# Patient Record
Sex: Female | Born: 1945 | Race: White | Hispanic: No | Smoking: Never smoker
Health system: Southern US, Community
[De-identification: ages and names within clinical notes are randomized; demographics above are authoritative.]

## PROBLEM LIST (undated history)

## (undated) DIAGNOSIS — J189 Pneumonia, unspecified organism: Secondary | ICD-10-CM

## (undated) DIAGNOSIS — M5481 Occipital neuralgia: Secondary | ICD-10-CM

## (undated) DIAGNOSIS — E538 Deficiency of other specified B group vitamins: Secondary | ICD-10-CM

## (undated) DIAGNOSIS — R1310 Dysphagia, unspecified: Secondary | ICD-10-CM

## (undated) DIAGNOSIS — C44212 Basal cell carcinoma of skin of right ear and external auricular canal: Secondary | ICD-10-CM

## (undated) DIAGNOSIS — E785 Hyperlipidemia, unspecified: Secondary | ICD-10-CM

## (undated) DIAGNOSIS — F32A Depression, unspecified: Secondary | ICD-10-CM

## (undated) DIAGNOSIS — K219 Gastro-esophageal reflux disease without esophagitis: Secondary | ICD-10-CM

## (undated) DIAGNOSIS — R011 Cardiac murmur, unspecified: Secondary | ICD-10-CM

## (undated) DIAGNOSIS — I499 Cardiac arrhythmia, unspecified: Secondary | ICD-10-CM

## (undated) DIAGNOSIS — F329 Major depressive disorder, single episode, unspecified: Secondary | ICD-10-CM

## (undated) DIAGNOSIS — M8580 Other specified disorders of bone density and structure, unspecified site: Secondary | ICD-10-CM

## (undated) DIAGNOSIS — T7840XA Allergy, unspecified, initial encounter: Secondary | ICD-10-CM

## (undated) DIAGNOSIS — E039 Hypothyroidism, unspecified: Secondary | ICD-10-CM

## (undated) DIAGNOSIS — G43909 Migraine, unspecified, not intractable, without status migrainosus: Secondary | ICD-10-CM

## (undated) DIAGNOSIS — R790 Abnormal level of blood mineral: Secondary | ICD-10-CM

## (undated) DIAGNOSIS — M2550 Pain in unspecified joint: Secondary | ICD-10-CM

## (undated) DIAGNOSIS — F411 Generalized anxiety disorder: Secondary | ICD-10-CM

## (undated) DIAGNOSIS — G4700 Insomnia, unspecified: Secondary | ICD-10-CM

## (undated) DIAGNOSIS — H269 Unspecified cataract: Secondary | ICD-10-CM

## (undated) DIAGNOSIS — M1990 Unspecified osteoarthritis, unspecified site: Secondary | ICD-10-CM

## (undated) DIAGNOSIS — K635 Polyp of colon: Secondary | ICD-10-CM

## (undated) DIAGNOSIS — Z0000 Encounter for general adult medical examination without abnormal findings: Secondary | ICD-10-CM

## (undated) DIAGNOSIS — Z8489 Family history of other specified conditions: Secondary | ICD-10-CM

## (undated) DIAGNOSIS — E063 Autoimmune thyroiditis: Secondary | ICD-10-CM

## (undated) DIAGNOSIS — M810 Age-related osteoporosis without current pathological fracture: Secondary | ICD-10-CM

## (undated) DIAGNOSIS — M1710 Unilateral primary osteoarthritis, unspecified knee: Secondary | ICD-10-CM

## (undated) DIAGNOSIS — K829 Disease of gallbladder, unspecified: Secondary | ICD-10-CM

## (undated) DIAGNOSIS — M549 Dorsalgia, unspecified: Secondary | ICD-10-CM

## (undated) DIAGNOSIS — F419 Anxiety disorder, unspecified: Secondary | ICD-10-CM

## (undated) DIAGNOSIS — Z780 Asymptomatic menopausal state: Secondary | ICD-10-CM

## (undated) DIAGNOSIS — G8929 Other chronic pain: Secondary | ICD-10-CM

## (undated) DIAGNOSIS — J209 Acute bronchitis, unspecified: Secondary | ICD-10-CM

## (undated) DIAGNOSIS — R0600 Dyspnea, unspecified: Secondary | ICD-10-CM

## (undated) DIAGNOSIS — E559 Vitamin D deficiency, unspecified: Secondary | ICD-10-CM

## (undated) DIAGNOSIS — R232 Flushing: Secondary | ICD-10-CM

## (undated) DIAGNOSIS — R55 Syncope and collapse: Secondary | ICD-10-CM

## (undated) DIAGNOSIS — K5900 Constipation, unspecified: Secondary | ICD-10-CM

## (undated) HISTORY — DX: Asymptomatic menopausal state: Z78.0

## (undated) HISTORY — DX: Major depressive disorder, single episode, unspecified: F32.9

## (undated) HISTORY — DX: Depression, unspecified: F32.A

## (undated) HISTORY — DX: Gastro-esophageal reflux disease without esophagitis: K21.9

## (undated) HISTORY — DX: Allergy, unspecified, initial encounter: T78.40XA

## (undated) HISTORY — DX: Anxiety disorder, unspecified: F41.9

## (undated) HISTORY — DX: Age-related osteoporosis without current pathological fracture: M81.0

## (undated) HISTORY — DX: Pain in unspecified joint: M25.50

## (undated) HISTORY — DX: Unspecified cataract: H26.9

## (undated) HISTORY — DX: Unilateral primary osteoarthritis, unspecified knee: M17.10

## (undated) HISTORY — DX: Encounter for general adult medical examination without abnormal findings: Z00.00

## (undated) HISTORY — DX: Dysphagia, unspecified: R13.10

## (undated) HISTORY — DX: Hypothyroidism, unspecified: E03.9

## (undated) HISTORY — DX: Hyperlipidemia, unspecified: E78.5

## (undated) HISTORY — DX: Vitamin D deficiency, unspecified: E55.9

## (undated) HISTORY — DX: Migraine, unspecified, not intractable, without status migrainosus: G43.909

## (undated) HISTORY — DX: Generalized anxiety disorder: F41.1

## (undated) HISTORY — DX: Disease of gallbladder, unspecified: K82.9

## (undated) HISTORY — DX: Insomnia, unspecified: G47.00

## (undated) HISTORY — DX: Acute bronchitis, unspecified: J20.9

## (undated) HISTORY — DX: Flushing: R23.2

## (undated) HISTORY — DX: Polyp of colon: K63.5

## (undated) HISTORY — DX: Autoimmune thyroiditis: E06.3

## (undated) HISTORY — DX: Unspecified osteoarthritis, unspecified site: M19.90

## (undated) HISTORY — PX: OTHER SURGICAL HISTORY: SHX169

## (undated) HISTORY — DX: Abnormal level of blood mineral: R79.0

## (undated) HISTORY — PX: CATARACT EXTRACTION: SUR2

## (undated) HISTORY — DX: Dyspnea, unspecified: R06.00

## (undated) HISTORY — DX: Cardiac murmur, unspecified: R01.1

## (undated) HISTORY — PX: EYE SURGERY: SHX253

## (undated) HISTORY — DX: Constipation, unspecified: K59.00

## (undated) HISTORY — DX: Cardiac arrhythmia, unspecified: I49.9

## (undated) HISTORY — PX: SPINE SURGERY: SHX786

## (undated) HISTORY — DX: Deficiency of other specified B group vitamins: E53.8

## (undated) HISTORY — DX: Occipital neuralgia: M54.81

## (undated) HISTORY — DX: Syncope and collapse: R55

## (undated) HISTORY — DX: Other specified disorders of bone density and structure, unspecified site: M85.80

---

## 1971-09-16 HISTORY — PX: TUBAL LIGATION: SHX77

## 1972-09-15 DIAGNOSIS — R112 Nausea with vomiting, unspecified: Secondary | ICD-10-CM

## 1972-09-15 DIAGNOSIS — Z9889 Other specified postprocedural states: Secondary | ICD-10-CM

## 1972-09-15 HISTORY — PX: CHOLECYSTECTOMY OPEN: SUR202

## 1972-09-15 HISTORY — DX: Nausea with vomiting, unspecified: R11.2

## 1972-09-15 HISTORY — DX: Other specified postprocedural states: Z98.890

## 1998-05-10 ENCOUNTER — Other Ambulatory Visit: Admission: RE | Admit: 1998-05-10 | Discharge: 1998-05-10 | Payer: Self-pay | Admitting: Gynecology

## 1998-11-28 ENCOUNTER — Other Ambulatory Visit: Admission: RE | Admit: 1998-11-28 | Discharge: 1998-11-28 | Payer: Self-pay | Admitting: Gynecology

## 1999-12-03 ENCOUNTER — Other Ambulatory Visit: Admission: RE | Admit: 1999-12-03 | Discharge: 1999-12-03 | Payer: Self-pay | Admitting: Gynecology

## 2000-12-07 ENCOUNTER — Other Ambulatory Visit: Admission: RE | Admit: 2000-12-07 | Discharge: 2000-12-07 | Payer: Self-pay | Admitting: Gynecology

## 2001-09-30 ENCOUNTER — Encounter: Payer: Self-pay | Admitting: Internal Medicine

## 2001-09-30 ENCOUNTER — Emergency Department (HOSPITAL_COMMUNITY): Admission: EM | Admit: 2001-09-30 | Discharge: 2001-09-30 | Payer: Self-pay | Admitting: Internal Medicine

## 2002-02-08 ENCOUNTER — Other Ambulatory Visit: Admission: RE | Admit: 2002-02-08 | Discharge: 2002-02-08 | Payer: Self-pay | Admitting: Family Medicine

## 2002-02-15 ENCOUNTER — Encounter: Payer: Self-pay | Admitting: Family Medicine

## 2002-02-15 ENCOUNTER — Ambulatory Visit (HOSPITAL_COMMUNITY): Admission: RE | Admit: 2002-02-15 | Discharge: 2002-02-15 | Payer: Self-pay | Admitting: Family Medicine

## 2002-03-03 ENCOUNTER — Encounter: Admission: RE | Admit: 2002-03-03 | Discharge: 2002-03-03 | Payer: Self-pay | Admitting: Family Medicine

## 2002-03-03 ENCOUNTER — Encounter: Payer: Self-pay | Admitting: Family Medicine

## 2003-02-23 ENCOUNTER — Other Ambulatory Visit: Admission: RE | Admit: 2003-02-23 | Discharge: 2003-02-23 | Payer: Self-pay | Admitting: Family Medicine

## 2004-02-29 ENCOUNTER — Other Ambulatory Visit: Admission: RE | Admit: 2004-02-29 | Discharge: 2004-02-29 | Payer: Self-pay | Admitting: Family Medicine

## 2004-03-06 ENCOUNTER — Ambulatory Visit (HOSPITAL_COMMUNITY): Admission: RE | Admit: 2004-03-06 | Discharge: 2004-03-06 | Payer: Self-pay | Admitting: Family Medicine

## 2004-09-15 HISTORY — PX: COLONOSCOPY: SHX174

## 2005-03-07 ENCOUNTER — Other Ambulatory Visit: Admission: RE | Admit: 2005-03-07 | Discharge: 2005-03-07 | Payer: Self-pay | Admitting: Family Medicine

## 2006-03-12 ENCOUNTER — Other Ambulatory Visit: Admission: RE | Admit: 2006-03-12 | Discharge: 2006-03-12 | Payer: Self-pay | Admitting: Family Medicine

## 2006-03-23 ENCOUNTER — Ambulatory Visit (HOSPITAL_COMMUNITY): Admission: RE | Admit: 2006-03-23 | Discharge: 2006-03-23 | Payer: Self-pay | Admitting: Family Medicine

## 2007-09-02 ENCOUNTER — Ambulatory Visit: Payer: Self-pay | Admitting: Cardiology

## 2007-09-21 ENCOUNTER — Ambulatory Visit: Payer: Self-pay

## 2009-01-29 ENCOUNTER — Encounter: Admission: RE | Admit: 2009-01-29 | Discharge: 2009-01-29 | Payer: Self-pay | Admitting: Internal Medicine

## 2009-02-14 ENCOUNTER — Encounter: Admission: RE | Admit: 2009-02-14 | Discharge: 2009-02-14 | Payer: Self-pay | Admitting: Internal Medicine

## 2009-02-14 ENCOUNTER — Encounter (INDEPENDENT_AMBULATORY_CARE_PROVIDER_SITE_OTHER): Payer: Self-pay | Admitting: Interventional Radiology

## 2009-02-14 ENCOUNTER — Other Ambulatory Visit: Admission: RE | Admit: 2009-02-14 | Discharge: 2009-02-14 | Payer: Self-pay | Admitting: Interventional Radiology

## 2010-02-18 ENCOUNTER — Encounter: Admission: RE | Admit: 2010-02-18 | Discharge: 2010-02-18 | Payer: Self-pay | Admitting: Internal Medicine

## 2010-10-20 ENCOUNTER — Emergency Department (HOSPITAL_COMMUNITY)
Admission: EM | Admit: 2010-10-20 | Discharge: 2010-10-20 | Disposition: A | Discharge status: Home / Self Care | Payer: BC Managed Care – PPO | Attending: Emergency Medicine | Admitting: Emergency Medicine

## 2010-10-20 ENCOUNTER — Emergency Department (HOSPITAL_COMMUNITY): Payer: BC Managed Care – PPO

## 2010-10-20 DIAGNOSIS — R079 Chest pain, unspecified: Secondary | ICD-10-CM | POA: Insufficient documentation

## 2010-10-20 DIAGNOSIS — R0602 Shortness of breath: Secondary | ICD-10-CM | POA: Insufficient documentation

## 2010-10-20 DIAGNOSIS — R0609 Other forms of dyspnea: Secondary | ICD-10-CM | POA: Insufficient documentation

## 2010-10-20 DIAGNOSIS — M549 Dorsalgia, unspecified: Secondary | ICD-10-CM | POA: Insufficient documentation

## 2010-10-20 DIAGNOSIS — R0989 Other specified symptoms and signs involving the circulatory and respiratory systems: Secondary | ICD-10-CM | POA: Insufficient documentation

## 2010-10-20 DIAGNOSIS — Z79899 Other long term (current) drug therapy: Secondary | ICD-10-CM | POA: Insufficient documentation

## 2010-10-20 DIAGNOSIS — M79609 Pain in unspecified limb: Secondary | ICD-10-CM | POA: Insufficient documentation

## 2010-10-20 LAB — POCT CARDIAC MARKERS
CKMB, poc: <1 ng/mL — ABNORMAL LOW (ref 1.0–8.0)
Myoglobin, poc: 61.4 ng/mL (ref 12–200)
Troponin i, poc: <0.05 ng/mL (ref 0.00–0.09)

## 2011-01-28 NOTE — Assessment & Plan Note (Signed)
[facility]                            CARDIOLOGY OFFICE NOTE   NAME:[name], [name]                     MRN:          [id number]  DATE:09/02/2007                            DOB:          May 03, 1946    I was asked by Paulene Floor to consult on [facility] with dyspnea on  exertion.   [name] Nolan is a 65 year old married white female, wife of a friend of  mine, who I saw initially for similar complaints back in 2003.  At that  time, we performed a stress Myoview which showed reduced exercise  tolerance of only 5 minutes on a Bruce protocol.  She had no ischemia or  infarction.  Her EF was 60%.   Over the last several months, she has noted an increase in dyspnea on  exertion particularly with activity.  This is particularly true with  housework.   She does not exercise on a regular basis.   Her cardiac risk factors include age, sex, hyperlipidemia once treated  with Zocor with good results, but she stopped because of arthralgias.  She does not smoke, does not have a history of hypertension or diabetes.  There is no premature history of coronary disease nor is it relevant in  her age.   Her total cholesterol runs about 250, but she has a good HDL which runs  in the 56 to 62 range.  Her total cholesterol to HDL ratio is about 4.  Her triglycerides were normal, and her LDL runs about 130 off of  treatment.   PAST MEDICAL HISTORY:  She is intolerant of CODEINE.   CURRENT MEDICATIONS:  1. Levoxyl 75 mcg a day.  2. Lexapro 20 mg a day.  3. Lorazepam 0.5 mg p.r.n.  4. Tandem Plus vitamin for some low-grade anemia with a hemoglobin of      10.5 when last checked.  5. Phentermine 37.5 mg q. day.  6. Fish oil 1,000 mg p.o. q. day.  7. Over-the-counter potassium.   PAST SURGICAL HISTORY:  Cholecystectomy in 1973.   FAMILY HISTORY:  Noncontributory.   SOCIAL HISTORY:  She is semi-retired from state, part-time job as an  Print production planner at  [employer].  She is married and  has one child.  Her husband, Leonette Most, is with her today.   REVIEW OF SYSTEMS:  Other than some history of anxiety and fatigue,  negative.   PHYSICAL EXAMINATION:  GENERAL:  She is a very pleasant lady in no acute  distress.  She has a great sense of humor.  VITAL SIGNS:  Her blood pressure is 148/96 today but usually is normal.  Her heart rate is 72 and regular.  Her electrocardiogram recently at  [facility] was normal.  Her height is 5 foot, 1-1/2 inches.  She  weighs 179 pounds.  HEENT:  Normocephalic, atraumatic, PERRLA, extraocular movements intact,  sclerae are clear, facial symmetry is normal.  She wears glasses.  Dentition is  satisfactory.  Carotid upstrokes are equal bilaterally  without bruits, no JVD.  NECK:  Supple, thyroid is not enlarged, trachea is midline.  LUNGS:  Clear.  HEART:  Reveals a nondisplaced PMI.  She has normal S1, S2, no click.  ABDOMINAL EXAM:  Soft, good bowel sounds, no midline bruit.  EXTREMITIES:  No cyanosis, clubbing or edema.  Pulses are intact.  NEURO EXAM:  Intact.   ASSESSMENT:  1. Dyspnea on exertion.  This most likely is secondary to      deconditioning and some low-grade anemia.  She is also somewhat      overweight.  We discussed this quite openly.  2. Hyperlipidemia.  She seems to be intolerant of ZOCOR.  She has a      very good total cholesterol to HDL ratio.  May not need a statin      with her other cardiac risk factors being fairly low.  3. Sedentary lifestyle.   RECOMMENDATIONS:  1. Exercise stress  test with Myoview to rule out obstructive coronary      artery disease.  2. Begin regular exercise program such as walking three hours per      week.  The risks reduction with this would be equal to that of      about a statin.  I tried to make her see the natural investment of      this, not having to take a drug as well.   Assuming her exercise stress test with Myoview  is normal, we will see  her back on a p.r.n. basis.     Thomas C. Daleen Squibb, MD, [facility]  Electronically Signed    TCW/MedQ  DD: 09/02/2007  DT: 09/03/2007  Job #: [id number]   cc:   Paulene Floor, NP

## 2012-12-28 ENCOUNTER — Other Ambulatory Visit (HOSPITAL_COMMUNITY): Payer: Self-pay | Admitting: Family Medicine

## 2012-12-28 DIAGNOSIS — Z139 Encounter for screening, unspecified: Secondary | ICD-10-CM

## 2012-12-28 DIAGNOSIS — N644 Mastodynia: Secondary | ICD-10-CM

## 2013-01-03 ENCOUNTER — Ambulatory Visit (HOSPITAL_COMMUNITY)
Admission: RE | Admit: 2013-01-03 | Discharge: 2013-01-03 | Disposition: A | Discharge status: Home / Self Care | Payer: Medicare Other | Source: Ambulatory Visit | Attending: Family Medicine | Admitting: Family Medicine

## 2013-01-03 DIAGNOSIS — Z139 Encounter for screening, unspecified: Secondary | ICD-10-CM

## 2013-01-12 ENCOUNTER — Ambulatory Visit (HOSPITAL_COMMUNITY)
Admission: RE | Admit: 2013-01-12 | Discharge: 2013-01-12 | Disposition: A | Discharge status: Home / Self Care | Payer: Medicare Other | Source: Ambulatory Visit | Attending: Family Medicine | Admitting: Family Medicine

## 2013-01-12 ENCOUNTER — Other Ambulatory Visit (HOSPITAL_COMMUNITY): Payer: Self-pay | Admitting: Family Medicine

## 2013-01-12 DIAGNOSIS — N644 Mastodynia: Secondary | ICD-10-CM

## 2013-02-24 ENCOUNTER — Other Ambulatory Visit: Payer: Self-pay | Admitting: Internal Medicine

## 2013-02-24 DIAGNOSIS — E041 Nontoxic single thyroid nodule: Secondary | ICD-10-CM

## 2013-03-14 ENCOUNTER — Ambulatory Visit
Admission: RE | Admit: 2013-03-14 | Discharge: 2013-03-14 | Disposition: A | Discharge status: Home / Self Care | Payer: Medicare Other | Source: Ambulatory Visit | Attending: Internal Medicine | Admitting: Internal Medicine

## 2013-03-14 DIAGNOSIS — E041 Nontoxic single thyroid nodule: Secondary | ICD-10-CM

## 2013-07-12 ENCOUNTER — Ambulatory Visit (INDEPENDENT_AMBULATORY_CARE_PROVIDER_SITE_OTHER): Payer: Self-pay | Admitting: Neurology

## 2013-07-12 ENCOUNTER — Encounter (INDEPENDENT_AMBULATORY_CARE_PROVIDER_SITE_OTHER): Payer: Self-pay

## 2013-07-12 ENCOUNTER — Encounter: Payer: Self-pay | Admitting: Diagnostic Neuroimaging

## 2013-07-12 ENCOUNTER — Ambulatory Visit (INDEPENDENT_AMBULATORY_CARE_PROVIDER_SITE_OTHER): Payer: Medicare Other | Admitting: Diagnostic Neuroimaging

## 2013-07-12 VITALS — BP 107/75 | HR 63 | Temp 98.0°F | Ht 64.5 in | Wt 160.0 lb

## 2013-07-12 DIAGNOSIS — G43719 Chronic migraine without aura, intractable, without status migrainosus: Secondary | ICD-10-CM | POA: Insufficient documentation

## 2013-07-12 DIAGNOSIS — Z0289 Encounter for other administrative examinations: Secondary | ICD-10-CM

## 2013-07-12 DIAGNOSIS — G43009 Migraine without aura, not intractable, without status migrainosus: Secondary | ICD-10-CM

## 2013-07-12 MED ORDER — VALPROATE SODIUM 500 MG/5ML IV SOLN
1000.0000 mg | INTRAVENOUS | Status: DC
  Administered 2013-07-12: 1000 mg via INTRAVENOUS

## 2013-07-12 MED ORDER — GABAPENTIN 300 MG PO CAPS: 300.0000 mg | ORAL_CAPSULE | Freq: Three times a day (TID) | ORAL | Status: DC

## 2013-07-12 NOTE — Progress Notes (Signed)
Patient here seeing Dr. Marjory Lies.  Order for Depacon 1000mg  IV.  Patient to treatment room with friend.  Patient complains of headache behind eyes, level 6, for 5 weeks. IV started in left AC, good blood return, 24g angiocath.  Depacon 1000mg /100cc NS started at 1139.  Upon completion headache down to level 3.  IV discontinued and removed.  Patient to checkout with friend in NAD.

## 2013-07-12 NOTE — Progress Notes (Signed)
[facility]  PATIENT: [name] Nolan DOB: 04-07-1946  REFERRING CLINICIAN: Stallings HISTORY FROM: patient and SO REASON FOR VISIT: new consult   HISTORICAL  CHIEF COMPLAINT:  Chief Complaint  Patient presents with  . Migraine    HISTORY OF PRESENT ILLNESS:   UPDATE 07/12/13: His last visit patient's husband has passed away and patient was under significant stress. Patient then had significant stress with reported lack of family support. Patient then started dating and other long time friend, whom she is now engaged to. They're planning to get married soon. Patient continues to have significant headaches over the past 5 weeks. She's tried steroids and Toradol without significant relief. Now she has a dull nagging headache. She's been taking Excedrin Migraine, Motrin, Aleve without benefit. She's having at least one severe headache per week. She has some nausea, photophobia, phonophobia. Headache is on left side but sometimes reduce the right side.  UPDATE 12/26/10: Doing about the same.  Headaches much better on gabapentin.  Took hydrocodone last night for left leg pain, and today has worse headache.  Left hip/leg/knee pain still persistent.  PRIOR HPI (10/01/10): 67 year old right-handed female with migraines, hypercholesterolemia, depression, anxiety, here for evaluation of intractable headaches.  She is here with her daughter and granddaughter for this visit.  Patient reports history of migraine headaches since age 57 years old. She describes unilateral, severe headaches associated with nausea, photophobia and phonophobia. Left side is more affected than the right side. She had approximately 3-4 migraine headaches per month until menopause and then her headaches subsided. She has tried Imitrex in the past but developed throat swelling and had to stop taking it. She has also tried Fioricet, Phenergan and Topamax without good relief.  However in winter 2010 patient  fell down, struck her back and head, and since that time she has had increasing headaches. She now reports daily headaches that are different from her prior migraine headaches. She has been taking Excedrin Migraine, 2-4 tablets every day for the past 5 months.  She also takes Aleve and Tylenol for arthritis pain on daily basis.  Psychosocial factors include reports poor sleep, increased irritability, increased stress, especially related to her husbands medical condition of dementia with Lewy bodies.  REVIEW OF SYSTEMS: Full 14 system review of systems performed and notable only for fatigue ringing in ears cough eye pain blurred vision easy bruising feeling hot feeling cold memory loss confusion headache numbness dizziness anxiety numbness and decreased energy change in appetite racing thoughts.  ALLERGIES: Allergies  Allergen Reactions  . Codeine   . Sulfa Antibiotics   . Sumatriptan Swelling    throat    HOME MEDICATIONS: No outpatient prescriptions prior to visit.   No facility-administered medications prior to visit.    PAST MEDICAL HISTORY: Past Medical History  Diagnosis Date  . Hypothyroid   . Migraine   . Anxiety   . Occipital neuralgia   . Hoarseness or changing voice   . Flushing   . Insomnia   . Esophageal reflux   . Difficulty swallowing   . Prolonged depressive reaction     PAST SURGICAL HISTORY: Past Surgical History  Procedure Laterality Date  . Cholecystectomy    . Tubal ligation      FAMILY HISTORY: Family History  Problem Relation Age of Onset  . Congestive Heart Failure Mother   . Leukemia Father     SOCIAL HISTORY:  History   Social History  . Marital Status: Significant Other  Spouse Name: Kathlene November    Number of Children: 1  . Years of Education: HS   Occupational History  . Retired    Social History Main Topics  . Smoking status: Never Smoker   . Smokeless tobacco: Never Used  . Alcohol Use: No  . Drug Use: No  . Sexual Activity:  Not on file   Other Topics Concern  . Not on file   Social History Narrative   Patient lives at home alone.   Caffeine Use: Occasionally     PHYSICAL EXAM  Filed Vitals:   07/12/13 1006  BP: 107/75  Pulse: 63  Temp: 98 F (36.7 C)  TempSrc: Oral  Height: 5' 4.5" (1.638 m)  Weight: 160 lb (72.576 kg)    Not recorded    Body mass index is 27.05 kg/(m^2).  GENERAL EXAM: Patient is in no distress  CARDIOVASCULAR: Regular rate and rhythm, no murmurs, no carotid bruits  NEUROLOGIC: MENTAL STATUS: awake, alert, language fluent, comprehension intact, naming intact CRANIAL NERVE: no papilledema on fundoscopic exam, pupils equal and reactive to light, visual fields full to confrontation, extraocular muscles intact, no nystagmus, facial sensation and strength symmetric, uvula midline, shoulder shrug symmetric, tongue midline. MOTOR: normal bulk and tone, full strength in the BUE, BLE SENSORY: normal and symmetric to light touch, pinprick, temperature, vibration COORDINATION: finger-nose-finger, fine finger movements normal REFLEXES: deep tendon reflexes present and symmetric GAIT/STATION: narrow based gait; able to walk on toes, heels and tandem; romberg is negative   DIAGNOSTIC DATA (LABS, IMAGING, TESTING) - I reviewed patient records, labs, notes, testing and imaging myself where available.  No results found for this basename: WBC, HGB, HCT, MCV, PLT   No results found for this basename: na, k, cl, co2, glucose, bun, creatinine, calcium, prot, albumin, ast, alt, alkphos, bilitot, gfrnonaa, gfraa   No results found for this basename: CHOL, HDL, LDLCALC, LDLDIRECT, TRIG, CHOLHDL   No results found for this basename: HGBA1C   No results found for this basename: VITAMINB12   No results found for this basename: TSH    10/03/10 MRI brain - mild chronic small vessel ischemic disease   ASSESSMENT AND PLAN  67 y.o. year old female here with worsening headaches in  setting of increased psychosocial stressors. Most likely represents migraine.  PLAN: - depacon infusion today - stop verapamil - start gabapentin - aleve or ibuprofen prn HA  Return in about 2 months (around 09/11/2013) for with Edison Nasuti, MD 07/12/2013, 11:15 AM Certified in Neurology, Neurophysiology and Neuroimaging  [facility] [address], [address] [place], [place] [address] [phone]

## 2013-07-12 NOTE — Patient Instructions (Signed)
Stop verapamil.  Start gabapentin 300mg  at bedtime. Gradually increase to three times a day.  Use aleve or ibuprofen as needed for breakthrough headaches.

## 2013-07-12 NOTE — Patient Instructions (Signed)
Patient left with friend feeling better.

## 2013-07-13 ENCOUNTER — Telehealth: Payer: Self-pay | Admitting: Diagnostic Neuroimaging

## 2013-07-13 NOTE — Telephone Encounter (Signed)
Ok

## 2013-07-18 ENCOUNTER — Telehealth: Payer: Self-pay | Admitting: *Deleted

## 2013-07-18 NOTE — Telephone Encounter (Signed)
Patient said that she wakes up 2-4  Am, headaches,neck pain which radiates down left side of jaw, takes 2 gabapentin tablets. Does  she need a dosage increase?  She is also taking xanax-1mg  to help sleep. Is also taking an 81 mg aspirin?

## 2013-07-22 MED ORDER — GABAPENTIN 300 MG PO CAPS: 600.0000 mg | ORAL_CAPSULE | Freq: Three times a day (TID) | ORAL | Status: DC

## 2013-07-22 NOTE — Telephone Encounter (Signed)
I called patient. I don't have the MRI disc yet. Will increase gabapentin to 600mg  TID.   Suanne Marker, MD 07/22/2013, 2:00 PM Certified in Neurology, Neurophysiology and Neuroimaging  [facility] [address], [address] [place], [place] [address] [phone]

## 2013-08-02 ENCOUNTER — Telehealth: Payer: Self-pay

## 2013-08-02 ENCOUNTER — Telehealth: Payer: Self-pay | Admitting: Diagnostic Neuroimaging

## 2013-08-02 NOTE — Telephone Encounter (Signed)
I called patient to discuss her desire to change doctors. Her husband, who has passed, saw Dr. Anne Hahn and she likes him very much. I let her know that my concern is that Dr. Anne Hahn does have any appointments open before May or June. Patient has a history of migraines. I asked her if she thought it would be best for me to see if one of our doctors who specialize in migraines would have availability to take her. She asked if Dr. Marjory Lies did not. I let her know he does have global neuro knowledge but there are others within our practice who have a greater focus specifically on migraines. Patient states she wants which ever one I would go to.  I let her know I would go to each for different reasons. I asked her if it would be okay to have them review her history and decide among them which felt would be the best for her.

## 2013-08-02 NOTE — Telephone Encounter (Signed)
I reviewed MRI from [facility] done on 04/29/13. Agree with report: chronic small vessel ischemic disease. I called patient with MRI results.  Suanne Marker, MD 08/02/2013, 6:58 PM Certified in Neurology, Neurophysiology and Neuroimaging  [facility] [address], [address] [place], [place] [address] [phone]

## 2013-09-04 ENCOUNTER — Other Ambulatory Visit: Payer: Self-pay

## 2013-09-04 MED ORDER — GABAPENTIN 300 MG PO CAPS: 600.0000 mg | ORAL_CAPSULE | Freq: Three times a day (TID) | ORAL | Status: DC

## 2013-09-20 ENCOUNTER — Encounter (INDEPENDENT_AMBULATORY_CARE_PROVIDER_SITE_OTHER): Payer: Self-pay

## 2013-09-20 ENCOUNTER — Ambulatory Visit (INDEPENDENT_AMBULATORY_CARE_PROVIDER_SITE_OTHER): Payer: Medicare Other | Admitting: Nurse Practitioner

## 2013-09-20 ENCOUNTER — Encounter: Payer: Self-pay | Admitting: Nurse Practitioner

## 2013-09-20 VITALS — BP 119/76 | HR 65 | Ht 64.5 in | Wt 171.0 lb

## 2013-09-20 DIAGNOSIS — G43009 Migraine without aura, not intractable, without status migrainosus: Secondary | ICD-10-CM

## 2013-09-20 MED ORDER — INDOMETHACIN 50 MG PO CAPS: 50.0000 mg | ORAL_CAPSULE | Freq: Three times a day (TID) | ORAL | Status: DC | PRN

## 2013-09-20 NOTE — Progress Notes (Signed)
PATIENT: [name] Nolan DOB: [birth year]   REASON FOR VISIT: follow up for Migraines HISTORY FROM: patient  HISTORY OF PRESENT ILLNESS: UPDATE 09/20/13 LL: [name] Nolan returns for revisit.  Gabapentin has helped relieve severity of headaches, but they are still present, Excedrin Migraine is the only thing that she takes that relieves them.  She has married her friend and still has a lot of stress because her daughter will not accept the marriage.  She is going still to grief counseling for her former husband.  Headaches she thinks are triggered by stress; she feels a lot of stress in her neck and shoulders, She wakes with dull headache on the top of her head and then it moves behind her eyes.  Overall, she thinks they are better than last visit.  UPDATE 07/12/13 (VP): Her last visit patient's husband has passed away and patient was under significant stress. Patient then had significant stress with reported lack of family support. Patient then started dating and other long time friend, whom she is now engaged to. They're planning to get married soon. Patient continues to have significant headaches over the past 5 weeks. She's tried steroids and Toradol without significant relief. Now she has a dull nagging headache. She's been taking Excedrin Migraine, Motrin, Aleve without benefit. She's having at least one severe headache per week. She has some nausea, photophobia, phonophobia. Headache is on left side but sometimes reduce the right side.  UPDATE 12/26/10: Doing about the same. Headaches much better on gabapentin. Took hydrocodone last night for left leg pain, and today has worse headache. Left hip/leg/knee pain still persistent.  PRIOR HPI (10/01/10): 68 year old right-handed female with migraines, hypercholesterolemia, depression, anxiety, here for evaluation of intractable headaches. She is here with her daughter and granddaughter for this visit.  Patient reports history of migraine  headaches since age 69 years old. She describes unilateral, severe headaches associated with nausea, photophobia and phonophobia. Left side is more affected than the right side. She had approximately 3-4 migraine headaches per month until menopause and then her headaches subsided. She has tried Imitrex in the past but developed throat swelling and had to stop taking it. She has also tried Fioricet, Phenergan and Topamax without good relief.  However in winter 2010 patient fell down, struck her back and head, and since that time she has had increasing headaches. She now reports daily headaches that are different from her prior migraine headaches. She has been taking Excedrin Migraine, 2-4 tablets every day for the past 5 months. She also takes Aleve and Tylenol for arthritis pain on daily basis.  Psychosocial factors include reports poor sleep, increased irritability, increased stress, especially related to her husbands medical condition of dementia with Lewy bodies.   REVIEW OF SYSTEMS: Full 14 system review of systems performed and notable only for fatigue ringing in ears cough eye pain blurred vision easy bruising feeling hot feeling cold memory loss confusion headache numbness dizziness anxiety numbness and decreased energy change in appetite racing thoughts.   ALLERGIES: Allergies  Allergen Reactions  . Codeine   . Sulfa Antibiotics   . Sumatriptan Swelling    throat    HOME MEDICATIONS: Outpatient Prescriptions Prior to Visit  Medication Sig Dispense Refill  . ALPRAZolam (XANAX) 1 MG tablet Take 1 mg by mouth 3 (three) times daily as needed for sleep.      Marland Kitchen escitalopram (LEXAPRO) 10 MG tablet Take 0.5 tablets by mouth at bedtime.      . gabapentin (  NEURONTIN) 300 MG capsule Take 2 capsules (600 mg total) by mouth 3 (three) times daily.  540 capsule  2  . levothyroxine (SYNTHROID, LEVOTHROID) 75 MCG tablet Take 75 mcg by mouth daily before breakfast.      . naproxen sodium (ANAPROX) 220  MG tablet Take 220 mg by mouth 2 (two) times daily with a meal.      . omeprazole (PRILOSEC) 40 MG capsule Take 40 mg by mouth daily.      Marland Kitchen valproate (DEPACON) 1,000 mg in sodium chloride 0.9 % 100 mL IVPB        No facility-administered medications prior to visit.    PAST MEDICAL HISTORY: Past Medical History  Diagnosis Date  . Hypothyroid   . Migraine   . Anxiety   . Occipital neuralgia   . Hoarseness or changing voice   . Flushing   . Insomnia   . Esophageal reflux   . Difficulty swallowing   . Prolonged depressive reaction     PAST SURGICAL HISTORY: Past Surgical History  Procedure Laterality Date  . Cholecystectomy    . Tubal ligation      FAMILY HISTORY: Family History  Problem Relation Age of Onset  . Congestive Heart Failure Mother   . Leukemia Father     SOCIAL HISTORY: History   Social History  . Marital Status: Significant Other    Spouse Name: [name]    Number of Children: 1  . Years of Education: HS   Occupational History  . Retired    Social History Main Topics  . Smoking status: Never Smoker   . Smokeless tobacco: Never Used  . Alcohol Use: No  . Drug Use: No  . Sexual Activity: Not on file   Other Topics Concern  . Not on file   Social History Narrative   Patient lives at home alone.   Caffeine Use: Occasionally     PHYSICAL EXAM  Filed Vitals:   09/20/13 1040  BP: 119/76  Pulse: 65  Height: 5' 4.5" (1.638 m)  Weight: 171 lb (77.565 kg)   Body mass index is 28.91 kg/(m^2).  Generalized: Well developed, in no acute distress  Head: normocephalic and atraumatic. Oropharynx benign  Neck: Supple, no carotid bruits  Cardiac: Regular rate rhythm, no murmur  Musculoskeletal: No deformity   NEUROLOGIC:  MENTAL STATUS: awake, alert, language fluent, comprehension intact, naming intact  CRANIAL NERVE: no papilledema on fundoscopic exam, pupils equal and reactive to light, visual fields full to confrontation, extraocular muscles  intact, no nystagmus, facial sensation and strength symmetric, uvula midline, shoulder shrug symmetric, tongue midline.  MOTOR: normal bulk and tone, full strength in the BUE, BLE  SENSORY: normal and symmetric to light touch, pinprick, temperature, vibration  COORDINATION: finger-nose-finger, fine finger movements normal  REFLEXES: deep tendon reflexes present and symmetric  GAIT/STATION: narrow based gait; able to walk on toes, heels and tandem; romberg is negative  DIAGNOSTIC DATA (LABS, IMAGING, TESTING) - I reviewed patient records, labs, notes, testing and imaging myself where available. 10/03/10 MRI brain - mild chronic small vessel ischemic disease   ASSESSMENT AND PLAN 68 y.o. year old female here with worsening headaches in setting of increased psychosocial stressors. Most likely represents migraine.   PLAN:  Continue gabapentin. Recommended stress reduction strategies with neck exercises. Try Indomethacin 50 mg q 6 hrs prn for headache. Continue Excedrin Migraine prn for headache For Migraine prevention: Take Magnesium 400 mg and Riboflavin 200 mg twice a day. May add CoQ10,  recommend at least 100 mg daily. Return in about 3-4 months with [name] Nolan ordered this encounter  Medications  . indomethacin (INDOCIN) 50 MG capsule    Sig: Take 1 capsule (50 mg total) by mouth 3 (three) times daily as needed for moderate pain.    Dispense:  90 capsule    Refill:  5    Order Specific Question:  Supervising Provider    Answer:  Penni Bombard [[id number]]   Return in about 3 months (around 12/19/2013).  Philmore Pali, MSN, NP-C 09/20/2013, 11:50 AM [facility] [address], [place], [place] [address] [phone]  Note: This document was prepared with digital dictation and possible smart phrase technology. Any transcriptional errors that result from this process are unintentional.

## 2013-09-20 NOTE — Patient Instructions (Addendum)
For Migraine prevention: Take Magnesium 400 mg and Riboflavin 200 mg twice a day.  You may add CoQ10, recommend at least 100 mg daily.  Send a Rx to [facility] for Indomethacin 50 mg capsules.  Take 1 capsule every 6 hours with food as needed for headache.  Follow up in 3-4 months.

## 2013-09-26 ENCOUNTER — Telehealth: Payer: Self-pay | Admitting: *Deleted

## 2013-09-26 NOTE — Telephone Encounter (Signed)
No the Indomethicin should not be taken more than every 6 hours, and the dose cannot be increased.  It should not be taken every day.   Try stress reduction techniques, lie down in a dark room.

## 2013-09-26 NOTE — Telephone Encounter (Signed)
I called pt back and relayed the information to her.  I instructed her on taking the indocin prn q 6 hours.  Continue taking the gabapentin as well as the mg, riboflavin, coq10 combination.  She stated will try this.  Has appt with Dr. Leta Baptist in April 2015.

## 2013-11-21 ENCOUNTER — Telehealth: Payer: Self-pay | Admitting: Diagnostic Neuroimaging

## 2013-11-21 NOTE — Telephone Encounter (Signed)
pls setup closer follow up visit with me or Jeani Hawking. -VRP

## 2013-11-21 NOTE — Telephone Encounter (Signed)
Patient indicates Gabapentin and Indocin are not working.  Says she is having daily migraines and has to take 4-5 Excedrin Migraines to get relief. Please advise.  Thank you.

## 2013-11-21 NOTE — Telephone Encounter (Signed)
Patient calling to state that her Gabapentin is no longer working for her and she is having chronic migraines every day. Patient states she has to take 4-5 Excedrin migraine tablets in order to get relief. Patient also states that the [place] prescribed is also not working for her and her insurance company is giving her trouble with it. Please call patient and advise.

## 2013-11-22 NOTE — Telephone Encounter (Signed)
Called patient to schedule an earlier appt per Dr. Leta Baptist on 11/24/13 with Jeani Hawking, NP. I advised the patient that if she has any other problems, questions or concerns to call the office. Patient verbalized understanding.

## 2013-11-23 ENCOUNTER — Telehealth: Payer: Self-pay | Admitting: Neurology

## 2013-11-23 NOTE — Telephone Encounter (Signed)
Has questions about pre authorization for Indomethazin

## 2013-11-23 NOTE — Telephone Encounter (Signed)
I called back.  Spoke with Stanton Kidney.  She said they will be faxing [place] additional paperwork for review.  Nothing further is needed at this time.

## 2013-11-24 ENCOUNTER — Encounter: Payer: Self-pay | Admitting: Nurse Practitioner

## 2013-11-24 ENCOUNTER — Encounter (INDEPENDENT_AMBULATORY_CARE_PROVIDER_SITE_OTHER): Payer: Self-pay

## 2013-11-24 ENCOUNTER — Ambulatory Visit (INDEPENDENT_AMBULATORY_CARE_PROVIDER_SITE_OTHER): Payer: Medicare Other | Admitting: Nurse Practitioner

## 2013-11-24 VITALS — BP 110/80 | HR 76 | Ht 64.75 in | Wt 170.0 lb

## 2013-11-24 DIAGNOSIS — F322 Major depressive disorder, single episode, severe without psychotic features: Secondary | ICD-10-CM

## 2013-11-24 DIAGNOSIS — G43009 Migraine without aura, not intractable, without status migrainosus: Secondary | ICD-10-CM

## 2013-11-24 MED ORDER — DIVALPROEX SODIUM ER 500 MG PO TB24: 500.0000 mg | ORAL_TABLET | Freq: Every day | ORAL | Status: DC

## 2013-11-24 NOTE — Patient Instructions (Addendum)
Start Divalproex ER 500 mg daily for Migraine relief.   Referral to Letta Moynahan, MD, Psychiatry in [place].  Someone will call you to schedule an appointment.  We will look at memory testing once depression is under control.  Follow up in 3 months.  Major Depressive Disorder Major depressive disorder (MDD) is a mental illness. It also may be called clinical depression or unipolar depression. MDD usually causes feelings of sadness, hopelessness, or helplessness. Some people with MDD do not feel particularly sad but lose interest in doing things they used to enjoy (anhedonia). MDD also can cause physical symptoms. It can interfere with work, school, relationships, and other normal everyday activities. MDD varies in severity but is longer lasting and more serious than the sadness we all feel from time to time in our lives. MDD often is triggered by stressful life events or major life changes. Examples of these triggers include divorce, loss of your job or home, a move, and the death of a family member or close friend. Sometimes MDD occurs for no obvious reason at all. People who have family members with MDD or bipolar disorder are at higher risk for developing MDD, with or without life stressors. MDD can occur at any age. It may occur just once in your life (single episode MDD). It may occur multiple times (recurrent MDD). SYMPTOMS People with MDD have either anhedonia or depressed mood on nearly a daily basis for at least 2 weeks or longer. Symptoms of depressed mood include:  Feelings of sadness (blue or down in the dumps) or emptiness.  Feelings of hopelessness or helplessness.  Tearfulness or episodes of crying (may be observed by others).  Irritability (children and adolescents). In addition to depressed mood or anhedonia or both, people with MDD have at least four of the following symptoms:  Difficulty sleeping or sleeping too much.   Significant change (increase or decrease) in  appetite or weight.   Lack of energy or motivation.  Feelings of guilt and worthlessness.   Difficulty concentrating, remembering, or making decisions.  Unusually slow movement (psychomotor retardation) or restlessness (as observed by others).   Recurrent wishes for death, recurrent thoughts of self-harm (suicide), or a suicide attempt. People with MDD commonly have persistent negative thoughts about themselves, other people, and the world. People with severe MDD may experiencedistorted beliefs or perceptions about the world (psychotic delusions). They also may see or hear things that are not real (psychotic hallucinations). DIAGNOSIS MDD is diagnosed through an assessment by your caregiver. Your caregiver will ask aboutaspects of your daily life, such as mood,sleep, and appetite, to see if you have the diagnostic symptoms of MDD. Your caregiver may ask about your medical history and use of alcohol or drugs, including prescription medications. Your caregiver also may do a physical exam and blood work. This is because certain medical conditions and the use of certain substances can cause MDD-like symptoms (secondary depression). Your caregiver also may refer you to a mental health specialist for further evaluation and treatment. TREATMENT It is important to recognize the symptoms of MDD and seek treatment. The following treatments can be prescribed for MDD:   Medication Antidepressant medications usually are prescribed. Antidepressant medications are thought to correct chemical imbalances in the brain that are commonly associated with MDD. Other types of medication may be added if MDD symptoms do not respond to antidepressant medications alone or if psychotic delusions or hallucinations occur.  Talk therapy Talk therapy can be helpful in treating MDD by providing support,  education, and guidance. Certain types of talk therapy also can help with negative thinking (cognitive behavioral  therapy) and with relationship issues that trigger MDD (interpersonal therapy). A mental health specialist can help determine which treatment is best for you. Most people with MDD do well with a combination of medication and talk therapy. Treatments involving electrical stimulation of the brain can be used in situations with extremely severe symptoms or when medication and talk therapy do not work over time. These treatments include electroconvulsive therapy, transcranial magnetic stimulation, and vagal nerve stimulation. Document Released: 12/27/2012 Document Reviewed: 12/27/2012 [facility] Patient Information 2014 [place], [place].

## 2013-11-24 NOTE — Progress Notes (Signed)
PATIENT: [name] Nolan DOB: June 19, 1946  REASON FOR VISIT: follow up for Migraines HISTORY FROM: patient  HISTORY OF PRESENT ILLNESS: UPDATE 11/24/13 (LL):  Patient's daughter calls for sooner revisit, she feels her mother is much worse, headaches are constant and everyday, and she is seeing more confusion, more severe depression, and thinks her mother is over-medicating.   Some days when she talked to her mother on the phone she had slurred speech, compelling her to take her mother to see the PCP with all of her medicine bottles. The medicines were reviewed and all sedating meds were removed.  The PCP recommended memory testing.  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  She states that she has no joy in her life. She recently found out that her younger brother has stage 4 cancer and was devastated by the news.    UPDATE 09/20/13 LL: [name] Nolan returns for revisit. Gabapentin has helped relieve severity of headaches, but they are still present, Excedrin Migraine is the only thing that she takes that relieves them. She has married her friend and still has a lot of stress because her daughter will not accept the marriage. She is going still to grief counseling for her former husband. Headaches she thinks are triggered by stress; she feels a lot of stress in her neck and shoulders, She wakes with dull headache on the top of her head and then it moves behind her eyes. Overall, she thinks they are better than last visit. UPDATE 07/12/13 (VP): Her last visit patient's husband has passed away and patient was under significant stress. Patient then had significant stress with reported lack of family support. Patient then started dating and other long time friend, whom she is now engaged to. They're planning to get married soon. Patient continues to have significant  headaches over the past 5 weeks. She's tried steroids and Toradol without significant relief. Now she has a dull nagging headache. She's been taking Excedrin Migraine, Motrin, Aleve without benefit. She's having at least one severe headache per week. She has some nausea, photophobia, phonophobia. Headache is on left side but sometimes reduce the right side.  UPDATE 12/26/10: Doing about the same. Headaches much better on gabapentin. Took hydrocodone last night for left leg pain, and today has worse headache. Left hip/leg/knee pain still persistent.  PRIOR HPI (10/01/10): 68 year old right-handed female with migraines, hypercholesterolemia, depression, anxiety, here for evaluation of intractable headaches. She is here with her daughter and granddaughter for this visit.  Patient reports history of migraine headaches since age 38 years old. She describes unilateral, severe headaches associated with nausea, photophobia and phonophobia. Left side is more affected than the right side. She had approximately 3-4 migraine headaches per month until menopause and then her headaches subsided. She has tried Imitrex in the past but developed throat swelling and had to stop taking it. She has also tried Fioricet, Phenergan and Topamax without good relief.  However in winter 2010 patient fell down, struck her back and head, and since that time she has had increasing headaches. She now reports daily headaches that are different from her prior migraine headaches. She has been taking Excedrin Migraine, 2-4 tablets every day for the past 5 months. She also takes Aleve and Tylenol for arthritis pain on daily basis.  Psychosocial factors include reports poor sleep, increased irritability, increased stress,  especially related to her husbands medical condition of dementia with Lewy bodies.   REVIEW OF SYSTEMS: Full 14 system review of systems performed and notable only for fatigue ringing in ears cough eye pain blurred vision easy  bruising feeling hot feeling cold memory loss confusion headache numbness dizziness anxiety numbness and decreased energy change in appetite racing thoughts.  ALLERGIES: Allergies  Allergen Reactions  . Codeine   . Sulfa Antibiotics   . Sumatriptan Swelling    throat    HOME MEDICATIONS: Outpatient Prescriptions Prior to Visit  Medication Sig Dispense Refill  . ALPRAZolam (XANAX) 1 MG tablet Take 1 mg by mouth 3 (three) times daily as needed for sleep.      Marland Kitchen escitalopram (LEXAPRO) 10 MG tablet Take 0.5 tablets by mouth at bedtime.      Marland Kitchen levothyroxine (SYNTHROID, LEVOTHROID) 75 MCG tablet Take 75 mcg by mouth daily before breakfast.      . gabapentin (NEURONTIN) 300 MG capsule Take 2 capsules (600 mg total) by mouth 3 (three) times daily.  540 capsule  2  . indomethacin (INDOCIN) 50 MG capsule Take 1 capsule (50 mg total) by mouth 3 (three) times daily as needed for moderate pain.  90 capsule  5   No facility-administered medications prior to visit.     PHYSICAL EXAM  Filed Vitals:   11/24/13 1434  BP: 110/80  Pulse: 76  Height: 5' 4.75" (1.645 m)  Weight: 170 lb (77.111 kg)   Body mass index is 28.5 kg/(m^2).  Generalized: Well developed, in no acute distress  Head: normocephalic and atraumatic. Oropharynx benign  Neck: Supple, no carotid bruits  Cardiac: Regular rate rhythm, no murmur  Musculoskeletal: No deformity   NEUROLOGIC:  MENTAL STATUS: awake, alert, language fluent, comprehension intact, naming intact  CRANIAL NERVE:  pupils equal and reactive to light, visual fields full to confrontation, extraocular muscles intact, no nystagmus, facial sensation and strength symmetric, uvula midline, shoulder shrug symmetric, tongue midline.  MOTOR: normal bulk and tone, full strength in the BUE, BLE  SENSORY: normal and symmetric to light touch, pinprick, temperature, vibration  COORDINATION: finger-nose-finger, fine finger movements normal  REFLEXES: deep tendon reflexes  present and symmetric  GAIT/STATION: narrow based gait; able to walk on toes, heels and tandem; romberg is negative  ASSESSMENT AND PLAN 68 y.o. year old female  has a past medical history of Hypothyroid; Migraine; Anxiety; Occipital neuralgia; Hoarseness or changing voice; Flushing; Insomnia; Esophageal reflux; Difficulty swallowing; and Prolonged depressive reaction here with worsening headaches in setting of increased psychosocial stressors. Most likely represents migraine. MRI in August 2014 was unremarkable.  She has continued to have worsening depression and daily headaches, without coordination, balance problems, nausea or vomiting.    Headaches are likely not going to improve until depression is treated.  I spoke with patient, husband and daughter at length about this and urged referral to Psychiatry and counseling.  They are in agreement.  PLAN:  Start Divalproex ER 500 mg daily for Migraine and mood stabilization. Referral to Letta Moynahan, MD, Psychiatry in [place].  If suicidal or acute worsening of depression, instructed to go to [facility] ER. We will look at memory testing once depression is under control. Follow up in 3 months.  Orders Placed This Encounter  Procedures  . Ambulatory referral to Psychiatry   Meds ordered this encounter  Medications  . divalproex (DEPAKOTE ER) 500 MG 24 hr tablet    Sig: Take 1 tablet (500 mg total) by mouth daily.  Dispense:  30 tablet    Refill:  3    Order Specific Question:  Supervising Provider    Answer:  Penni Bombard [[id number]]   Return in about 3 months (around 02/24/2014).  Philmore Pali, MSN, NP-C 11/24/2013, 8:14 PM [facility] [address], [place], [place] [address] [phone]  Note: This document was prepared with digital dictation and possible smart phrase technology. Any transcriptional errors that result from this process are unintentional.

## 2013-11-25 NOTE — Progress Notes (Signed)
I reviewed note and agree with plan.   Penni Bombard, MD 1/43/8887, 5:79 PM Certified in Neurology, Neurophysiology and Neuroimaging  [facility] [address] [place], [place] [address] [phone]

## 2013-11-29 ENCOUNTER — Telehealth: Payer: Self-pay | Admitting: Diagnostic Neuroimaging

## 2013-11-29 NOTE — Telephone Encounter (Signed)
Patient's daughter Charlynne Cousins is calling to let Dr. Leta Baptist know that the patient refuses to see a psychiatrist, like he was advised to. Patient's daughter would like for the Dr. To give her a call back please at [phone]. Please advise

## 2013-11-29 NOTE — Telephone Encounter (Signed)
Patient is refusing treatment to see psychiatrist--please call

## 2013-11-29 NOTE — Telephone Encounter (Signed)
I called pts daughter. Reviewed plan. Encouraged psychiatry evaluation. -VRP

## 2013-12-01 ENCOUNTER — Telehealth: Payer: Self-pay | Admitting: Nurse Practitioner

## 2013-12-01 NOTE — Telephone Encounter (Signed)
Called pt and pt stated that Jeani Hawking, NP had referred pt to see an psychiatrist and when the pt talked with their office and pt explained to them what insurance that pt had, the office told her that her insurance would not cover it and pt stated that she was calling because she did not want that in her record as refusing help. Pt stated that she told them that she could not pay out of pocket to come see the psychiatrist. I explained to the pt that she needed to contact the psychiatrist's office to let them know. I advised the pt that if she has any other problems, questions or concerns to call the office. Pt verbalized understanding.

## 2013-12-20 ENCOUNTER — Ambulatory Visit: Payer: Medicare Other | Admitting: Nurse Practitioner

## 2013-12-29 ENCOUNTER — Ambulatory Visit: Payer: Medicare Other | Admitting: Diagnostic Neuroimaging

## 2014-02-17 ENCOUNTER — Telehealth: Payer: Self-pay | Admitting: Diagnostic Neuroimaging

## 2014-02-17 ENCOUNTER — Telehealth: Payer: Self-pay | Admitting: *Deleted

## 2014-02-17 ENCOUNTER — Other Ambulatory Visit: Payer: Self-pay | Admitting: Nurse Practitioner

## 2014-02-17 DIAGNOSIS — F329 Major depressive disorder, single episode, unspecified: Secondary | ICD-10-CM

## 2014-02-17 DIAGNOSIS — F4321 Adjustment disorder with depressed mood: Secondary | ICD-10-CM

## 2014-02-17 MED ORDER — PREDNISONE (PAK) 10 MG PO TABS: ORAL_TABLET | ORAL | Status: AC

## 2014-02-17 MED ORDER — TOPIRAMATE 25 MG PO TABS: 25.0000 mg | ORAL_TABLET | Freq: Two times a day (BID) | ORAL | Status: DC

## 2014-02-17 NOTE — Telephone Encounter (Signed)
Pt calling wanting to know if a CT scan would be beneficial to him to help find out why pt has headaches all the time. Please advise

## 2014-02-17 NOTE — Telephone Encounter (Signed)
Called pt to inform her per Jeani Hawking, NP that unless there are new neurological symptoms of severe vertigo, nausea and vomiting, a CT scan is not indicated and the pt's MRI in November was unremarkable. Pt stated that she has been see an chiropractor for her headaches also and that she still can not get any relief from the headaches. Pt also stated that she could not go to the referral to Psychiatry because her insurance would not pay for it. Please advise

## 2014-02-17 NOTE — Telephone Encounter (Signed)
Patient requesting an Rx to break the cycle of Chronic Migraines daily.  Gabapentin is not working, and taking three Excedrin Migraine and 1 mg of Xanax to help sleep at night.  Please call and advise

## 2014-02-17 NOTE — Telephone Encounter (Signed)
I called back.  Relayed info in note from [place].  Patient verbalized understanding.

## 2014-02-17 NOTE — Progress Notes (Signed)
I called back.  Relayed info in note from [name] Nolan.  Patient verbalized understanding.

## 2014-02-17 NOTE — Telephone Encounter (Signed)
I entered orders for Prednisone Pack and Topamax.  Please call and let her know.  I also entered a new referral to [facility], Dr. Caprice Beaver would not accept her insurance, per phone note.

## 2014-02-17 NOTE — Progress Notes (Signed)
Patient calling to request different preventive medication to treat Migraine, Depakote made her feel sick and she stopped.  Sent Prednisone Dose Pak to pharmacy along with Topirimate 25 mg BID.  Also putting in new referral for Psychiatry.  Dr. Arvil Persons office did not accept her insurance.

## 2014-02-17 NOTE — Telephone Encounter (Signed)
Unless there are new neurological symptoms of severe vertigo, nausea and vomiting, a CT is not indicated.   MRI in November was unremarkable.  Please call her.  (Dr. Leta Baptist has seen patient for this problem for 3 years.  Patient has been having frequent headaches since 2012  We have referred to Psychiatry for depression).

## 2014-02-17 NOTE — Telephone Encounter (Signed)
Patient called requesting a med for migraine preventive, and indicates she has daily migraines.  Says she has to take 3 Excedrin Migraine and Xanax to sleep at night.  I called her back to see if she is taking the preventive (Depakote) Lynn prescribed at last OV.  She said she decided to stop taking it because it made her feel sick.  Stated she does not want to take it again and would like a different med prescribed.  Please advise.  Thank you.

## 2014-02-22 ENCOUNTER — Telehealth: Payer: Self-pay | Admitting: Nurse Practitioner

## 2014-02-22 NOTE — Telephone Encounter (Signed)
Zomig is in the same family as Imitrex which she had throat swelling with.  It comes in tablet and nasal spray.  If she has used it before with no reaction we can prescribe it.    Has she been contacted concerning [facility] referral?  She is Medicare, they should accept her insurance.

## 2014-02-22 NOTE — Telephone Encounter (Signed)
Spoke with patient and she said that she will take her last dose of prednisone tomorrow, would like to try a medication her friend takes which is Zomig

## 2014-02-22 NOTE — Telephone Encounter (Signed)
Patient calling because she has had a migraine for 6 days. Please call-asking if she can get some other medication

## 2014-02-24 ENCOUNTER — Ambulatory Visit: Payer: Medicare Other | Admitting: Nurse Practitioner

## 2014-02-24 NOTE — Telephone Encounter (Signed)
Patient called on 02-22-14 and has not gotten a response--please call.

## 2014-02-27 NOTE — Telephone Encounter (Signed)
I called pt and relayed the message below from Charlott Holler, NP re: Zomig.  LMVM for her to return call.

## 2014-02-28 ENCOUNTER — Ambulatory Visit: Payer: Self-pay | Admitting: Diagnostic Neuroimaging

## 2014-03-01 NOTE — Telephone Encounter (Signed)
Pt returned call.  She is calling back about the medication zomig.  She relayed that she had reaction of what she felt like was her throat closing up.  She did not leave work or go seek care.  She said it was when  imitrex first came out (> 70yrs ago).    She is taking topamax 25mg  po bid.  She is better today, is trying to get something for if and when comes back.   She is seeing a counselor locally for family issues.  When is last she was going thru a lot of family issues and was at the tipping point.  So will not need referral to psychiatry and will discuss further when in July appt.

## 2014-03-08 ENCOUNTER — Telehealth: Payer: Self-pay | Admitting: Diagnostic Neuroimaging

## 2014-03-08 NOTE — Telephone Encounter (Signed)
Called pt and left message informing pt per Dr. Leta Baptist that he would discuss this issue with pt at next [place] on 03/22/14. I advised the pt that if she has any other problems, questions or concerns to call the office.

## 2014-03-08 NOTE — Telephone Encounter (Signed)
Called [name] Nolan and [name] Nolan stated that she would like for Dr. Leta Baptist to give her a call back. [name] Nolan states that she is still having headaches and the medication topamax 25 mg twice a day is not helping. I asked the [name] Nolan if she wanted to come in earlier, [name] Nolan stated that she did not want to see a NP and that she would wait to see Dr. Leta Baptist on 03/22/14. Please advise

## 2014-03-08 NOTE — Telephone Encounter (Signed)
Pt called states she is still having headaches and wants Dr. Tish Frederickson to call her back. Pt states she does not want a nurse or NP to call her she only wants to speak with Dr. Tish Frederickson. Thanks

## 2014-03-08 NOTE — Telephone Encounter (Signed)
**Note De-Identified Livy Ross Obfuscation** Will discuss with patient at next visit. -VRP

## 2014-03-22 ENCOUNTER — Encounter: Payer: Self-pay | Admitting: Diagnostic Neuroimaging

## 2014-03-22 ENCOUNTER — Ambulatory Visit (INDEPENDENT_AMBULATORY_CARE_PROVIDER_SITE_OTHER): Payer: Medicare Other | Admitting: Diagnostic Neuroimaging

## 2014-03-22 ENCOUNTER — Encounter (INDEPENDENT_AMBULATORY_CARE_PROVIDER_SITE_OTHER): Payer: Self-pay

## 2014-03-22 VITALS — BP 113/77 | HR 55 | Temp 98.0°F | Ht 61.0 in | Wt 154.0 lb

## 2014-03-22 DIAGNOSIS — R51 Headache: Secondary | ICD-10-CM

## 2014-03-22 DIAGNOSIS — R519 Headache, unspecified: Secondary | ICD-10-CM

## 2014-03-22 DIAGNOSIS — G43019 Migraine without aura, intractable, without status migrainosus: Secondary | ICD-10-CM

## 2014-03-22 MED ORDER — TOPIRAMATE 50 MG PO TABS: 50.0000 mg | ORAL_TABLET | Freq: Two times a day (BID) | ORAL | Status: DC

## 2014-03-22 MED ORDER — RIZATRIPTAN BENZOATE 10 MG PO TBDP: 10.0000 mg | ORAL_TABLET | ORAL | Status: DC | PRN

## 2014-03-22 MED ORDER — PROPRANOLOL HCL 20 MG PO TABS: 20.0000 mg | ORAL_TABLET | Freq: Two times a day (BID) | ORAL | Status: DC

## 2014-03-22 NOTE — Progress Notes (Signed)
PATIENT: [name] Nolan DOB: Nov 21, 1945  REASON FOR VISIT: follow up for Migraines HISTORY FROM: patient  HISTORY OF PRESENT ILLNESS:  UPDATE 03/22/14: Since last visit, continues with chronic daily headche; also with intermittent migraine (3 per week). Now seeing a counselor. Asking about zomig, maxalt, amerge. Tells me that she took amerge in the past without reaction. Also started on magnesium. Has stopped caffeine, dairy, cheese, MSG, artificial sweeteners. Stress is finally stabilizing.  UPDATE 11/24/13 (LL):  Patient's daughter calls for sooner revisit, she feels her mother is much worse, headaches are constant and everyday, and she is seeing more confusion, more severe depression, and thinks her mother is over-medicating.   Some days when she talked to her mother on the phone she had slurred speech, compelling her to take her mother to see the PCP with all of her medicine bottles. The medicines were reviewed and all sedating meds were removed.  The PCP recommended memory testing.  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  She states that she has no joy in her life. She recently found out that her younger brother has stage 4 cancer and was devastated by the news.    UPDATE 09/20/13 LL: [name] Nolan returns for revisit. Gabapentin has helped relieve severity of headaches, but they are still present, Excedrin Migraine is the only thing that she takes that relieves them. She has married her friend and still has a lot of stress because her daughter will not accept the marriage. She is going still to grief counseling for her former husband. Headaches she thinks are triggered by stress; she feels a lot of stress in her neck and shoulders, She wakes with dull headache on the top of her head and then it moves behind her eyes. Overall, she thinks they are better  than last visit.  UPDATE 07/12/13 (VP): Her last visit patient's husband has passed away and patient was under significant stress. Patient then had significant stress with reported lack of family support. Patient then started dating and other long time friend, whom she is now engaged to. They're planning to get married soon. Patient continues to have significant headaches over the past 5 weeks. She's tried steroids and Toradol without significant relief. Now she has a dull nagging headache. She's been taking Excedrin Migraine, Motrin, Aleve without benefit. She's having at least one severe headache per week. She has some nausea, photophobia, phonophobia. Headache is on left side but sometimes reduce the right side.   UPDATE 12/26/10: Doing about the same. Headaches much better on gabapentin. Took hydrocodone last night for left leg pain, and today has worse headache. Left hip/leg/knee pain still persistent.   PRIOR HPI (10/01/10): 68 year old right-handed female with migraines, hypercholesterolemia, depression, anxiety, here for evaluation of intractable headaches. She is here with her daughter and granddaughter for this visit. Patient reports history of migraine headaches since age 81 years old. She describes unilateral, severe headaches associated with nausea, photophobia and phonophobia. Left side is more affected than the right side. She had approximately 3-4 migraine headaches per month until menopause and then her headaches subsided. She has tried Imitrex in the past but developed throat swelling and had to stop taking it. She has also tried Fioricet, Phenergan and Topamax without good relief. However in winter 2010 patient fell down, struck her back and head, and since that time  she has had increasing headaches. She now reports daily headaches that are different from her prior migraine headaches. She has been taking Excedrin Migraine, 2-4 tablets every day for the past 5 months. She also takes Aleve and  Tylenol for arthritis pain on daily basis. Psychosocial factors include reports poor sleep, increased irritability, increased stress, especially related to her husbands medical condition of dementia with Lewy bodies.   REVIEW OF SYSTEMS: Full 14 system review of systems performed and notable only for headache depression anxiety insomnia restless legs vomiting nausea: Allergy components light sensitivity blurred vision shortness choking chest pain trouble swallowing ringing in ears fatigue chills appetite change excessive sweating.   ALLERGIES: Allergies  Allergen Reactions  . Codeine   . Sulfa Antibiotics   . Sumatriptan Swelling    throat    HOME MEDICATIONS: Outpatient Prescriptions Prior to Visit  Medication Sig Dispense Refill  . ALPRAZolam (XANAX) 1 MG tablet Take 1 mg by mouth 3 (three) times daily as needed for sleep.      Marland Kitchen escitalopram (LEXAPRO) 10 MG tablet Take 0.5 tablets by mouth at bedtime.      Marland Kitchen levothyroxine (SYNTHROID, LEVOTHROID) 75 MCG tablet Take 75 mcg by mouth daily before breakfast.      . topiramate (TOPAMAX) 25 MG tablet Take 1 tablet (25 mg total) by mouth 2 (two) times daily. Take 1 tablet daily at bedtime for 1st week, then twice daily.  60 tablet  5  . indomethacin (INDOCIN) 50 MG capsule        No facility-administered medications prior to visit.     PHYSICAL EXAM  Filed Vitals:   03/22/14 1325  BP: 113/77  Pulse: 55  Temp: 98 F (36.7 C)  TempSrc: Oral  Height: 5\' 1"  (1.549 m)  Weight: 154 lb (69.854 kg)   Body mass index is 29.11 kg/(m^2).  Generalized: Well developed, in no acute distress  Head: normocephalic and atraumatic. Oropharynx benign  Neck: Supple, no carotid bruits  Cardiac: Regular rate rhythm, no murmur  Musculoskeletal: No deformity   NEUROLOGIC:  MENTAL STATUS: awake, alert, language fluent, comprehension intact, naming intact  CRANIAL NERVE:  pupils equal and reactive to light, visual fields full to confrontation,  extraocular muscles intact, no nystagmus, facial sensation and strength symmetric, uvula midline, shoulder shrug symmetric, tongue midline.  MOTOR: normal bulk and tone, full strength in the BUE, BLE  SENSORY: normal and symmetric to light touch, pinprick, temperature, vibration  COORDINATION: finger-nose-finger, fine finger movements normal  REFLEXES: deep tendon reflexes present and symmetric  GAIT/STATION: narrow based gait; able to walk on toes, heels and tandem; romberg is negative  ASSESSMENT AND PLAN 68 y.o. year old female  with worsening headaches in setting of increased psychosocial stressors. Most likely represents migraine + chronic daily headache. MRI in August 2014 was unremarkable.    PLAN:  - increase TPX to 50mg  BID - add propranolol 20mg  BID - try maxalt prn HA (not clear that patient has a definite reaction to triptan class; had 1 event 10 yrs ago of throat tight feeling with sumatriptan, but has been able to take naratriptan (friend's) without reaction. Will cautiously try maxalt.  Meds ordered this encounter  Medications  . topiramate (TOPAMAX) 50 MG tablet    Sig: Take 1 tablet (50 mg total) by mouth 2 (two) times daily. Take 1 tablet daily at bedtime for 1st week, then twice daily.    Dispense:  60 tablet    Refill:  12  Order Specific Question:  Supervising Provider    Answer:  Jim Like, [place] [[id number]]  . propranolol (INDERAL) 20 MG tablet    Sig: Take 1 tablet (20 mg total) by mouth 2 (two) times daily.    Dispense:  60 tablet    Refill:  6  . rizatriptan (MAXALT-MLT) 10 MG disintegrating tablet    Sig: Take 1 tablet (10 mg total) by mouth as needed for migraine. May repeat in 2 hours if needed    Dispense:  9 tablet    Refill:  11   Return in about 6 months (around 09/22/2014).  Penni Bombard, MD 7/0/0174, 9:44 PM Certified in Neurology, Neurophysiology and Neuroimaging  [facility] [address], [place],  [place] [address] [phone]

## 2014-03-30 ENCOUNTER — Telehealth: Payer: Self-pay | Admitting: Diagnostic Neuroimaging

## 2014-03-30 NOTE — Telephone Encounter (Signed)
Patient wanted to let Dr. Leta Baptist know she started topiramate (TOPAMAX) 50 MG tablet increase on 03/23/14 and stopped taking med on 03/28/14 because of side effects. Patient said she believes medication increase affected her vision and appetite. She says she currently still does not have an appetite. Patient started taking propranolol (INDERAL) 20 MG tablet on 03/29/14. She would like to know if Dr. Tish Frederickson thinks the Topomax in her system could be causing intermittent itching all over her body in different areas also.

## 2014-03-31 NOTE — Telephone Encounter (Signed)
Agree with stopping topiramate and continuing propranolol. I don't think ongoing itching should be related to topiramate at this point. If itching doesn't subside, then she should follow up with PCP. -VRP

## 2014-04-03 ENCOUNTER — Other Ambulatory Visit: Payer: Self-pay | Admitting: Nurse Practitioner

## 2014-04-03 ENCOUNTER — Telehealth: Payer: Self-pay | Admitting: Diagnostic Neuroimaging

## 2014-04-03 DIAGNOSIS — G43009 Migraine without aura, not intractable, without status migrainosus: Secondary | ICD-10-CM

## 2014-04-03 MED ORDER — RIZATRIPTAN BENZOATE 10 MG PO TBDP: 10.0000 mg | ORAL_TABLET | ORAL | Status: DC | PRN

## 2014-04-03 MED ORDER — ZOLMITRIPTAN 5 MG PO TABS: 5.0000 mg | ORAL_TABLET | ORAL | Status: DC | PRN

## 2014-04-03 NOTE — Telephone Encounter (Signed)
Please call patient. I reorder Maxalt with a quantity of 15.  I also ordered Zomig tablets, but there is no generic so this may be expensive for her.

## 2014-04-03 NOTE — Telephone Encounter (Signed)
Patient called stating she has only 4 maxalt tablets left and she is still having headaches. Patient would like another medication called into the pharmacy, but she can not get a refill on Maxalt.

## 2014-04-03 NOTE — Telephone Encounter (Signed)
Patient would like another Rx called in.  She has Maxalt, but says it does not last long.  She discontinued Topamax and has continued Propranolol.   Please advise.  Thank you.

## 2014-04-03 NOTE — Telephone Encounter (Signed)
Ok to refill maxalt

## 2014-04-03 NOTE — Telephone Encounter (Signed)
Called patient. Gave Dr. Gladstone Lighter previous note. Patient agreed. She is requesting a refill on rizatriptan (MAXALT-MLT) 10 MG disintegrating tablet . She says it has been working well,  but does not last long.

## 2014-04-03 NOTE — Telephone Encounter (Signed)
I called the patient back.  Relayed [name] Nolan's message.  She verbalized understanding and will call us back if anything further is needed.

## 2014-04-03 NOTE — Telephone Encounter (Signed)
Please advise 

## 2014-04-03 NOTE — Progress Notes (Signed)
Sent Zomig tablet Rx to patient's pharmacy, ran out of [place].

## 2014-04-06 ENCOUNTER — Telehealth: Payer: Self-pay | Admitting: Nurse Practitioner

## 2014-04-06 MED ORDER — AMITRIPTYLINE HCL 10 MG PO TABS: 10.0000 mg | ORAL_TABLET | Freq: Every day | ORAL | Status: DC

## 2014-04-06 NOTE — Telephone Encounter (Signed)
I have sent in Amitriptyline 10 mg for her, take 1 tablet at bedtime. Side effects may be dizziness, drowsiness, constipation, dry mouth. Please let her know.

## 2014-04-06 NOTE — Telephone Encounter (Signed)
I called the patient.  Relayed Lynn's message.  She verbalized understanding.

## 2014-04-06 NOTE — Telephone Encounter (Signed)
Patient has decided she does not wish to take Zomig.  States she would rather have a Rx for Amitriptyline instead.  Please advise.  Thank you.

## 2014-04-06 NOTE — Telephone Encounter (Signed)
Patient called stating she would like a prescription for Amtriptyline instead of Zomig because Amtriptyline helps her to sleep.

## 2014-04-06 NOTE — Telephone Encounter (Signed)
I have sent in Amitriptyline 10 mg for her, take 1 tablet at bedtime.  Side effects may be dizziness, dry mouth, constipation, drowsiness. Please let her know.

## 2014-04-20 NOTE — Telephone Encounter (Signed)
Noted  

## 2014-05-04 ENCOUNTER — Emergency Department (HOSPITAL_BASED_OUTPATIENT_CLINIC_OR_DEPARTMENT_OTHER)
Admission: EM | Admit: 2014-05-04 | Discharge: 2014-05-04 | Disposition: A | Discharge status: Home / Self Care | Payer: Medicare Other | Attending: Emergency Medicine | Admitting: Emergency Medicine

## 2014-05-04 ENCOUNTER — Encounter (HOSPITAL_BASED_OUTPATIENT_CLINIC_OR_DEPARTMENT_OTHER): Payer: Self-pay | Admitting: Emergency Medicine

## 2014-05-04 ENCOUNTER — Ambulatory Visit (INDEPENDENT_AMBULATORY_CARE_PROVIDER_SITE_OTHER): Payer: Medicare Other | Admitting: Internal Medicine

## 2014-05-04 ENCOUNTER — Emergency Department (HOSPITAL_BASED_OUTPATIENT_CLINIC_OR_DEPARTMENT_OTHER): Payer: Medicare Other

## 2014-05-04 ENCOUNTER — Encounter: Payer: Self-pay | Admitting: Internal Medicine

## 2014-05-04 VITALS — BP 96/66 | HR 58 | Temp 98.1°F | Resp 16 | Ht 64.0 in | Wt 159.0 lb

## 2014-05-04 DIAGNOSIS — Z79899 Other long term (current) drug therapy: Secondary | ICD-10-CM | POA: Insufficient documentation

## 2014-05-04 DIAGNOSIS — Z8719 Personal history of other diseases of the digestive system: Secondary | ICD-10-CM | POA: Diagnosis not present

## 2014-05-04 DIAGNOSIS — F411 Generalized anxiety disorder: Secondary | ICD-10-CM

## 2014-05-04 DIAGNOSIS — E039 Hypothyroidism, unspecified: Secondary | ICD-10-CM | POA: Diagnosis not present

## 2014-05-04 DIAGNOSIS — E038 Other specified hypothyroidism: Secondary | ICD-10-CM

## 2014-05-04 DIAGNOSIS — R0789 Other chest pain: Secondary | ICD-10-CM | POA: Insufficient documentation

## 2014-05-04 DIAGNOSIS — J3089 Other allergic rhinitis: Secondary | ICD-10-CM

## 2014-05-04 DIAGNOSIS — R079 Chest pain, unspecified: Secondary | ICD-10-CM | POA: Insufficient documentation

## 2014-05-04 DIAGNOSIS — Z8742 Personal history of other diseases of the female genital tract: Secondary | ICD-10-CM | POA: Insufficient documentation

## 2014-05-04 DIAGNOSIS — G43909 Migraine, unspecified, not intractable, without status migrainosus: Secondary | ICD-10-CM | POA: Diagnosis not present

## 2014-05-04 DIAGNOSIS — Z8739 Personal history of other diseases of the musculoskeletal system and connective tissue: Secondary | ICD-10-CM | POA: Diagnosis not present

## 2014-05-04 DIAGNOSIS — E785 Hyperlipidemia, unspecified: Secondary | ICD-10-CM

## 2014-05-04 LAB — CBC
HCT: 39.1 % (ref 36.0–46.0)
Hemoglobin: 12.8 g/dL (ref 12.0–15.0)
MCH: 28.8 pg (ref 26.0–34.0)
MCHC: 32.7 g/dL (ref 30.0–36.0)
MCV: 87.9 fL (ref 78.0–100.0)
Platelets: 256 10*3/uL (ref 150–400)
RBC: 4.45 MIL/uL (ref 3.87–5.11)
RDW: 14.3 % (ref 11.5–15.5)
WBC: 4.8 10*3/uL (ref 4.0–10.5)

## 2014-05-04 LAB — COMPREHENSIVE METABOLIC PANEL
ALBUMIN: 3.9 g/dL (ref 3.5–5.2)
ALK PHOS: 92 U/L (ref 39–117)
ALT: 14 U/L (ref 0–35)
ANION GAP: 11 (ref 5–15)
AST: 28 U/L (ref 0–37)
BUN: 15 mg/dL (ref 6–23)
CO2: 28 mEq/L (ref 19–32)
CREATININE: 1.1 mg/dL (ref 0.50–1.10)
Calcium: 10.2 mg/dL (ref 8.4–10.5)
Chloride: 103 mEq/L (ref 96–112)
GFR calc Af Amer: 59 mL/min — ABNORMAL LOW (ref >90)
GFR calc non Af Amer: 51 mL/min — ABNORMAL LOW (ref >90)
Glucose, Bld: 77 mg/dL (ref 70–99)
POTASSIUM: 4.2 meq/L (ref 3.7–5.3)
Sodium: 142 mEq/L (ref 137–147)
TOTAL PROTEIN: 7.5 g/dL (ref 6.0–8.3)
Total Bilirubin: 0.3 mg/dL (ref 0.3–1.2)

## 2014-05-04 LAB — TROPONIN I

## 2014-05-04 NOTE — ED Notes (Addendum)
NP at bedside.  Pt denies HA at this time.

## 2014-05-04 NOTE — ED Provider Notes (Signed)
CSN: [id number]     Arrival date & time 05/04/14  1431 History   First MD Initiated Contact with Patient 05/04/14 1450     Chief Complaint  Patient presents with  . Chest Pain     (Consider location/radiation/quality/duration/timing/severity/associated sxs/prior Treatment) HPI Comments: Pt comes in today with intermittent episodes or cp and sob of breath over the last couple of months. Pt states that she was setting up care with a new pcp so she was sent her to be evaluated. Pt states that she had some pressure this morning that lasted about 20 minutes. Denies diaphoresis, n/v. Pt states that her sob is no different that is on going  The history is provided by the patient. No language interpreter was used.    Past Medical History  Diagnosis Date  . Hypothyroid   . Migraine   . Anxiety   . Occipital neuralgia   . Hoarseness or changing voice   . Flushing   . Insomnia   . Esophageal reflux   . Difficulty swallowing   . Prolonged depressive reaction   . Hyperlipidemia   . Menopause   . History of migraine headaches    Past Surgical History  Procedure Laterality Date  . Cholecystectomy    . Tubal ligation     Family History  Problem Relation Age of Onset  . Congestive Heart Failure Mother   . Leukemia Father   . Kidney disease Brother    History  Substance Use Topics  . Smoking status: Never Smoker   . Smokeless tobacco: Never Used  . Alcohol Use: No   OB History   Grav Para Term Preterm Abortions TAB SAB Ect Mult Living                 Review of Systems  Constitutional: Negative.   Respiratory: Positive for chest tightness.   Cardiovascular: Positive for chest pain.      Allergies  Codeine and Sulfa antibiotics  Home Medications   Prior to Admission medications   Medication Sig Start Date End Date Taking? Authorizing Provider  ALPRAZolam Duanne Moron) 1 MG tablet Take 1 mg by mouth 3 (three) times daily as needed for sleep.    Historical Provider, MD   amitriptyline (ELAVIL) 10 MG tablet Take 1 tablet (10 mg total) by mouth at bedtime. 04/06/14   Philmore Pali, NP  calcium gluconate 500 MG tablet Take 1 tablet by mouth 2 (two) times daily.    Historical Provider, MD  cetirizine (ZYRTEC) 10 MG tablet Take 10 mg by mouth daily.    Historical Provider, MD  co-enzyme Q-10 50 MG capsule Take 100 mg by mouth daily.    Historical Provider, MD  escitalopram (LEXAPRO) 10 MG tablet Take 0.5 tablets by mouth at bedtime. 06/28/13   Historical Provider, MD  levothyroxine (SYNTHROID, LEVOTHROID) 75 MCG tablet Take 75 mcg by mouth daily before breakfast.    Historical Provider, MD  magnesium gluconate (MAGONATE) 500 MG tablet Take 500 mg by mouth.    Historical Provider, MD  propranolol (INDERAL) 20 MG tablet Take 1 tablet (20 mg total) by mouth 2 (two) times daily. 03/22/14   Penni Bombard, MD  rizatriptan (MAXALT-MLT) 10 MG disintegrating tablet Take 1 tablet (10 mg total) by mouth as needed for migraine. May repeat in 2 hours if needed 04/03/14   Philmore Pali, NP  zolmitriptan (ZOMIG) 5 MG tablet Take 1 tablet (5 mg total) by mouth as needed for migraine. 04/03/14  Philmore Pali, NP   BP 115/78  Pulse 52  Temp(Src) 97.8 F (36.6 C) (Oral)  Ht 5\' 5"  (1.651 m)  Wt 159 lb (72.122 kg)  BMI 26.46 kg/m2  SpO2 100% Physical Exam  Nursing note and vitals reviewed. Constitutional: She is oriented to person, place, and time. She appears well-developed and well-nourished.  HENT:  Head: Normocephalic and atraumatic.  Cardiovascular: Normal rate and regular rhythm.   Pulmonary/Chest: Effort normal and breath sounds normal. She exhibits no tenderness.  Abdominal: Soft. Bowel sounds are normal. There is no tenderness.  Musculoskeletal: Normal range of motion.  Neurological: She is alert and oriented to person, place, and time.  Skin: Skin is warm and dry.  Psychiatric: She has a normal mood and affect.    ED Course  Procedures (including critical care  time) Labs Review Labs Reviewed  COMPREHENSIVE METABOLIC PANEL - Abnormal; Notable for the following:    GFR calc non Af Amer 51 (*)    GFR calc Af Amer 59 (*)    All other components within normal limits  TROPONIN I  CBC  TROPONIN I    Imaging Review Dg Chest 2 View  05/04/2014   CLINICAL DATA:  Chest tightness.  EXAM: CHEST  2 VIEW  COMPARISON:  10/20/2010  FINDINGS: Two views of the chest demonstrate mild hyperinflation. The lungs are clear. Heart and mediastinum are within normal limits. The trachea is midline. No acute bone abnormality.  IMPRESSION: No active cardiopulmonary disease.   Electronically Signed   By: Markus Daft M.D.   On: 05/04/2014 15:30     EKG Interpretation   Date/Time:  Thursday May 04 2014 14:37:56 EDT Ventricular Rate:  53 PR Interval:  150 QRS Duration: 92 QT Interval:  440 QTC Calculation: 412 R Axis:   29 Text Interpretation:  Sinus bradycardia Nonspecific T wave abnormality  Abnormal ECG Confirmed by RAY MD, DANIELLE ([id number]) on 05/04/2014 3:15:30 PM      MDM   Final diagnoses:  Other chest pain    Delta troponin negative. Pt it to follow up with pcp to have further evaluation. Discussed findings with pt and follow up and return precautions. Pt is okay with plan    Glendell Docker, NP 05/04/14 1857

## 2014-05-04 NOTE — ED Notes (Signed)
Pt sent from PMD office for chest pain eval, c/o chest pressure this am, denies CP now

## 2014-05-04 NOTE — Discharge Instructions (Signed)

## 2014-05-07 DIAGNOSIS — F419 Anxiety disorder, unspecified: Secondary | ICD-10-CM

## 2014-05-07 DIAGNOSIS — E782 Mixed hyperlipidemia: Secondary | ICD-10-CM | POA: Insufficient documentation

## 2014-05-07 DIAGNOSIS — E785 Hyperlipidemia, unspecified: Secondary | ICD-10-CM | POA: Insufficient documentation

## 2014-05-07 DIAGNOSIS — J309 Allergic rhinitis, unspecified: Secondary | ICD-10-CM | POA: Insufficient documentation

## 2014-05-07 DIAGNOSIS — E063 Autoimmune thyroiditis: Secondary | ICD-10-CM | POA: Insufficient documentation

## 2014-05-07 DIAGNOSIS — F411 Generalized anxiety disorder: Secondary | ICD-10-CM | POA: Insufficient documentation

## 2014-05-07 DIAGNOSIS — E039 Hypothyroidism, unspecified: Secondary | ICD-10-CM | POA: Insufficient documentation

## 2014-05-07 DIAGNOSIS — F32A Depression, unspecified: Secondary | ICD-10-CM

## 2014-05-07 HISTORY — DX: Anxiety disorder, unspecified: F41.9

## 2014-05-07 HISTORY — DX: Depression, unspecified: F32.A

## 2014-05-07 HISTORY — DX: Generalized anxiety disorder: F41.1

## 2014-05-07 NOTE — Progress Notes (Signed)
Subjective:    Patient ID: [name] Nolan, female    DOB: 10/07/1945, 68 y.o.   MRN: [id number]  HPI [name] Nolan is a new pt here first visit primay care  .  PMH of migraine HA (Dr. Rayvon Nolan),  Mixed anxiety/depression,   Hyperlipidemia with statin intolerance, Hypothyroidism  (Dr. Buddy Nolan) , and allergic rhintis  Pt reports persistant DOE over the past 2 months.  Associated with chest pain "under my left breast" no N/V no diaphoresis.  She has never sought eval for this.  She did have some chest pressure this am  Migraine  approz 4 weeks ago began Bid dosing of her propranolol as advised by her neurologist for prophylaxis  See BP.  No dizziness    Allergies  Allergen Reactions  . Codeine   . Statins     intolerance  . Sulfa Antibiotics    Past Medical History  Diagnosis Date  . Hypothyroid   . Migraine   . Anxiety   . Occipital neuralgia   . Hoarseness or changing voice   . Flushing   . Insomnia   . Esophageal reflux   . Difficulty swallowing   . Prolonged depressive reaction   . Hyperlipidemia   . Menopause   . History of migraine headaches    Past Surgical History  Procedure Laterality Date  . Cholecystectomy    . Tubal ligation     History   Social History  . Marital Status: Significant Other    Spouse Name: [name] Nolan    Number of Children: 1  . Years of Education: HS   Occupational History  . Retired    Social History Main Topics  . Smoking status: Never Smoker   . Smokeless tobacco: Never Used  . Alcohol Use: No  . Drug Use: No  . Sexual Activity: Not on file   Other Topics Concern  . Not on file   Social History Narrative   Patient is married [name] Nolan) and lives at home with her husband.   Patient has one child.   Patient has a high school education.   Patient is right-handed.   Caffeine Use: Occasionally   Family History  Problem Relation Age of Onset  . Congestive Heart Failure Mother   . Leukemia Father   . Kidney disease Brother     Patient Active Problem List   Diagnosis Date Noted  . Anxiety state, unspecified 05/07/2014  . Hyperlipidemia 05/07/2014  . Hypothyroidism 05/07/2014  . Allergic rhinitis 05/07/2014  . Migraine without aura 07/12/2013   Current Outpatient Prescriptions on File Prior to Visit  Medication Sig Dispense Refill  . ALPRAZolam (XANAX) 1 MG tablet Take 1 mg by mouth 3 (three) times daily as needed for sleep.      Marland Kitchen amitriptyline (ELAVIL) 10 MG tablet Take 1 tablet (10 mg total) by mouth at bedtime.  30 tablet  3  . escitalopram (LEXAPRO) 10 MG tablet Take 0.5 tablets by mouth at bedtime.      Marland Kitchen levothyroxine (SYNTHROID, LEVOTHROID) 75 MCG tablet Take 75 mcg by mouth daily before breakfast.      . propranolol (INDERAL) 20 MG tablet Take 1 tablet (20 mg total) by mouth 2 (two) times daily.  60 tablet  6  . rizatriptan (MAXALT-MLT) 10 MG disintegrating tablet Take 1 tablet (10 mg total) by mouth as needed for migraine. May repeat in 2 hours if needed  15 tablet  11  . zolmitriptan (ZOMIG) 5 MG tablet Take 1 tablet (5  mg total) by mouth as needed for migraine.  12 tablet  5   No current facility-administered medications on file prior to visit.       Review of Systems See HPI      Objective:   Physical Exam Physical Exam  Nursing note and vitals reviewed.  Constitutional: She is oriented to person, place, and time. She appears well-developed and well-nourished.  HENT:  Head: Normocephalic and atraumatic.  Cardiovascular: Normal rate and regular rhythm. Exam reveals no gallop and no friction rub.  No murmur heard.  Pulmonary/Chest: Breath sounds normal. She has no wheezes. She has no rales.  Neurological: She is alert and oriented to person, place, and time.  Skin: Skin is warm and dry.  Psychiatric: She has a normal mood and affect. Her behavior is normal.         Assessment & Plan:  Chest pain  Although she has some atypical features,  With 20 min episode this am and DOE and  is persistant,  will transfer to ER for stat labs, troponin and initial rule out.    Hyperlipidemia  She is intolerant of statins will check fasting lipids next visit  Migraine headache    Continue rec as per Dr. Rayvon Nolan   Mixed anxiety/depression  Hypothyroidism  Will check next visit

## 2014-05-08 ENCOUNTER — Telehealth: Payer: Self-pay | Admitting: *Deleted

## 2014-05-08 ENCOUNTER — Telehealth: Payer: Self-pay | Admitting: Internal Medicine

## 2014-05-08 DIAGNOSIS — R0600 Dyspnea, unspecified: Secondary | ICD-10-CM

## 2014-05-08 NOTE — Telephone Encounter (Signed)
Dr Coralyn Mark,  Can you get with me about this? I was not here while pt was here and I am not sure what to tell her about what your recommendations are.   Thanks [employer]

## 2014-05-08 NOTE — Telephone Encounter (Signed)
[name] Nolan called wanting her test results from Thursday, and to know what Dr. Sharin Mons recommendation is

## 2014-05-08 NOTE — ED Provider Notes (Signed)
History/physical exam/procedure(s) were performed by non-physician practitioner and as supervising physician I was immediately available for consultation/collaboration. I have reviewed all notes and am in agreement with care and plan.   Shaune Pollack, MD 05/08/14 2027

## 2014-05-08 NOTE — Telephone Encounter (Signed)
Spoke with pt and informed of labs and xray,  EKG  Minor TWI  She would like to see a cardiologist  Ok to refer   She has had no further chest pressure of DOE since her ER visit

## 2014-05-08 NOTE — Patient Instructions (Signed)
To ER

## 2014-05-16 ENCOUNTER — Encounter: Payer: Self-pay | Admitting: Internal Medicine

## 2014-05-16 ENCOUNTER — Ambulatory Visit (INDEPENDENT_AMBULATORY_CARE_PROVIDER_SITE_OTHER): Payer: Medicare Other | Admitting: Internal Medicine

## 2014-05-16 VITALS — BP 99/69 | HR 67 | Temp 98.0°F | Wt 159.5 lb

## 2014-05-16 DIAGNOSIS — R0600 Dyspnea, unspecified: Secondary | ICD-10-CM

## 2014-05-16 DIAGNOSIS — E038 Other specified hypothyroidism: Secondary | ICD-10-CM

## 2014-05-16 DIAGNOSIS — R0609 Other forms of dyspnea: Secondary | ICD-10-CM

## 2014-05-16 DIAGNOSIS — E785 Hyperlipidemia, unspecified: Secondary | ICD-10-CM

## 2014-05-16 DIAGNOSIS — R0989 Other specified symptoms and signs involving the circulatory and respiratory systems: Secondary | ICD-10-CM

## 2014-05-16 DIAGNOSIS — L989 Disorder of the skin and subcutaneous tissue, unspecified: Secondary | ICD-10-CM

## 2014-05-16 LAB — LIPID PANEL
CHOLESTEROL: 237 mg/dL — AB (ref 0–200)
HDL: 47 mg/dL (ref >39)
LDL Cholesterol: 148 mg/dL — ABNORMAL HIGH (ref 0–99)
TRIGLYCERIDES: 210 mg/dL — AB (ref <150)
Total CHOL/HDL Ratio: 5 Ratio
VLDL: 42 mg/dL — ABNORMAL HIGH (ref 0–40)

## 2014-05-16 LAB — TSH: TSH: 2.567 u[IU]/mL (ref 0.350–4.500)

## 2014-05-16 NOTE — Patient Instructions (Signed)
Give pt number to Dr. Delman Cheadle or Dr. Renda Rolls pt will make appt

## 2014-05-16 NOTE — Progress Notes (Signed)
Subjective:    Patient ID: [name] Nolan, female    DOB: [birth year], 68 y.o.   MRN: [id number]  HPI  [name] Nolan is here for follow up.  .  She still has DOE  No wheezing no chest pain   ER eval nonspecific change with neg troponin.   She does have upcoming appt with Dr. Stanford Breed for further eval  She has seen Dr. Denna Haggard for lesions on her neck that have been there for 2 years.    She would like to see a different dermatologist .     Allergies  Allergen Reactions  . Codeine   . Statins     intolerance  . Sulfa Antibiotics    Past Medical History  Diagnosis Date  . Hypothyroid   . Migraine   . Anxiety   . Occipital neuralgia   . Hoarseness or changing voice   . Flushing   . Insomnia   . Esophageal reflux   . Difficulty swallowing   . Prolonged depressive reaction   . Hyperlipidemia   . Menopause   . History of migraine headaches    Past Surgical History  Procedure Laterality Date  . Cholecystectomy    . Tubal ligation     History   Social History  . Marital Status: Significant Other    Spouse Name: [name]    Number of Children: 1  . Years of Education: HS   Occupational History  . Retired    Social History Main Topics  . Smoking status: Never Smoker   . Smokeless tobacco: Never Used  . Alcohol Use: No  . Drug Use: No  . Sexual Activity: Yes    Birth Control/ Protection: Post-menopausal   Other Topics Concern  . Not on file   Social History Narrative   Patient is married [name] Nolan) and lives at home with her husband.   Patient has one child.   Patient has a high school education.   Patient is right-handed.   Caffeine Use: Occasionally   Family History  Problem Relation Age of Onset  . Congestive Heart Failure Mother   . Leukemia Father   . Kidney disease Brother    Patient Active Problem List   Diagnosis Date Noted  . Anxiety state, unspecified 05/07/2014  . Hyperlipidemia 05/07/2014  . Hypothyroidism 05/07/2014  . Allergic rhinitis  05/07/2014  . Migraine without aura 07/12/2013   Current Outpatient Prescriptions on File Prior to Visit  Medication Sig Dispense Refill  . ALPRAZolam (XANAX) 1 MG tablet Take 1 mg by mouth 3 (three) times daily as needed for sleep.      Marland Kitchen amitriptyline (ELAVIL) 10 MG tablet Take 1 tablet (10 mg total) by mouth at bedtime.  30 tablet  3  . calcium gluconate 500 MG tablet Take 1 tablet by mouth 2 (two) times daily.      Marland Kitchen co-enzyme Q-10 50 MG capsule Take 100 mg by mouth daily.      Marland Kitchen escitalopram (LEXAPRO) 10 MG tablet Take 0.5 tablets by mouth at bedtime.      Marland Kitchen levothyroxine (SYNTHROID, LEVOTHROID) 75 MCG tablet Take 75 mcg by mouth daily before breakfast.      . magnesium gluconate (MAGONATE) 500 MG tablet Take 500 mg by mouth daily.       . propranolol (INDERAL) 20 MG tablet Take 1 tablet (20 mg total) by mouth 2 (two) times daily.  60 tablet  6  . rizatriptan (MAXALT-MLT) 10 MG disintegrating tablet Take 1 tablet (  10 mg total) by mouth as needed for migraine. May repeat in 2 hours if needed  15 tablet  11  . zolmitriptan (ZOMIG) 5 MG tablet Take 1 tablet (5 mg total) by mouth as needed for migraine.  12 tablet  5   No current facility-administered medications on file prior to visit.      Review of Systems    See HPI Objective:   Physical Exam  Physical Exam  Nursing note and vitals reviewed.  Constitutional: She is oriented to person, place, and time. She appears well-developed and well-nourished.  HENT:  Head: Normocephalic and atraumatic.  Cardiovascular: Normal rate and regular rhythm. Exam reveals no gallop and no friction rub.  No murmur heard.  Pulmonary/Chest: Breath sounds normal. She has no wheezes. She has no rales.  Neurological: She is alert and oriented to person, place, and time.  Skin: Skin is warm and dry.  She has several  Warty vs skin tag lesions on her neck Psychiatric: She has a normal mood and affect. Her behavior is normal.          Assessment  & Plan:  DOE:   Cardiology eval pending  Skin lesions  She sees Dr. Denna Haggard and I also gave number to Dr. Christean Leaf practice for pt to make appt if she desires.  Hyperlipidemia  Will check today

## 2014-05-17 ENCOUNTER — Encounter: Payer: Self-pay | Admitting: Internal Medicine

## 2014-05-31 ENCOUNTER — Ambulatory Visit (INDEPENDENT_AMBULATORY_CARE_PROVIDER_SITE_OTHER): Payer: Medicare Other | Admitting: Cardiology

## 2014-05-31 ENCOUNTER — Encounter: Payer: Self-pay | Admitting: *Deleted

## 2014-05-31 ENCOUNTER — Encounter: Payer: Self-pay | Admitting: Cardiology

## 2014-05-31 VITALS — BP 112/78 | HR 72 | Ht 64.0 in | Wt 162.0 lb

## 2014-05-31 DIAGNOSIS — E785 Hyperlipidemia, unspecified: Secondary | ICD-10-CM

## 2014-05-31 DIAGNOSIS — R0989 Other specified symptoms and signs involving the circulatory and respiratory systems: Secondary | ICD-10-CM

## 2014-05-31 DIAGNOSIS — R0609 Other forms of dyspnea: Secondary | ICD-10-CM

## 2014-05-31 DIAGNOSIS — R0600 Dyspnea, unspecified: Secondary | ICD-10-CM

## 2014-05-31 DIAGNOSIS — R079 Chest pain, unspecified: Secondary | ICD-10-CM

## 2014-05-31 NOTE — Progress Notes (Signed)
HPI: 68 year old female for evaluation of dyspnea. Seen in emergency room in August of 2015 with chest pain and dyspnea. Troponins negative. Chest x-ray is no acute disease. Hemoglobin and potassium normal. Patient states she has had dyspnea on exertion for years. It is worse recently. No orthopnea, PND or pedal edema. No recent travel. She also has had intermittent chest pain for years. Most recently this occurred 2 months ago. It was in the epigastric area without radiation. It was not pleuritic, positional or exertional. Lasted 15 minutes and resolved spontaneously. She does not have exertional chest pain. Because of the above we were asked to evaluate.  Current Outpatient Prescriptions  Medication Sig Dispense Refill  . ALPRAZolam (XANAX) 1 MG tablet Take 1 mg by mouth 3 (three) times daily as needed for sleep.      Marland Kitchen amitriptyline (ELAVIL) 10 MG tablet Take 1 tablet (10 mg total) by mouth at bedtime.  30 tablet  3  . calcium gluconate 500 MG tablet Take 1 tablet by mouth 2 (two) times daily.      Marland Kitchen co-enzyme Q-10 50 MG capsule Take 100 mg by mouth daily.      Marland Kitchen escitalopram (LEXAPRO) 10 MG tablet Take 0.5 tablets by mouth at bedtime.      Marland Kitchen levothyroxine (SYNTHROID, LEVOTHROID) 75 MCG tablet Take 75 mcg by mouth daily before breakfast.      . magnesium gluconate (MAGONATE) 500 MG tablet Take 500 mg by mouth daily.       . propranolol (INDERAL) 20 MG tablet Take 1 tablet (20 mg total) by mouth 2 (two) times daily.  60 tablet  6  . rizatriptan (MAXALT-MLT) 10 MG disintegrating tablet Take 1 tablet (10 mg total) by mouth as needed for migraine. May repeat in 2 hours if needed  15 tablet  11  . zolmitriptan (ZOMIG) 5 MG tablet Take 1 tablet (5 mg total) by mouth as needed for migraine.  12 tablet  5   No current facility-administered medications for this visit.    Allergies  Allergen Reactions  . Codeine   . Statins     intolerance  . Sulfa Antibiotics     Past Medical History    Diagnosis Date  . Hypothyroid   . Migraine   . Anxiety   . Occipital neuralgia   . Insomnia   . Esophageal reflux   . Prolonged depressive reaction   . Hyperlipidemia   . Menopause     Past Surgical History  Procedure Laterality Date  . Cholecystectomy    . Tubal ligation      History   Social History  . Marital Status: Married    Spouse Name: [name]    Number of Children: 1  . Years of Education: HS   Occupational History  . Retired    Social History Main Topics  . Smoking status: Never Smoker   . Smokeless tobacco: Never Used  . Alcohol Use: No  . Drug Use: No  . Sexual Activity: Yes    Birth Control/ Protection: Post-menopausal   Other Topics Concern  . Not on file   Social History Narrative   Patient is married [name]) and lives at home with her husband.   Patient has one child.   Patient has a high school education.   Patient is right-handed.   Caffeine Use: Occasionally    Family History  Problem Relation Age of Onset  . Congestive Heart Failure Mother   . Leukemia Father   .  Kidney disease Brother     ROS: no fevers or chills, productive cough, hemoptysis, dysphasia, odynophagia, melena, hematochezia, dysuria, hematuria, rash, seizure activity, orthopnea, PND, pedal edema, claudication. Remaining systems are negative.  Physical Exam:   Blood pressure 112/78, pulse 72, height 5\' 4"  (1.626 m), weight 162 lb (73.483 kg).  General:  Well developed/well nourished in NAD Skin warm/dry Patient not depressed No peripheral clubbing Back-normal HEENT-normal/normal eyelids Neck supple/normal carotid upstroke bilaterally; no bruits; no JVD; no thyromegaly chest - CTA/ normal expansion CV - irregular/normal S1 and S2; no murmurs, rubs or gallops;  PMI nondisplaced Abdomen -NT/ND, no HSM, no mass, + bowel sounds, no bruit 2+ femoral pulses, no bruits Ext-no edema, chords, 2+ DP Neuro-grossly nonfocal  ECG 05/04/2014-sinus rhythm with nonspecific ST  changes.  Electrocardiogram today shows sinus rhythm with occasional PVCs. Nonspecific ST changes. Low voltage.

## 2014-05-31 NOTE — Assessment & Plan Note (Signed)
Management per primary care. 

## 2014-05-31 NOTE — Assessment & Plan Note (Signed)
Etiology unclear. Schedule echocardiogram to assess LV systolic and diastolic function. Schedule stress echocardiogram to exclude ischemia. No chest x-ray no acute disease. Not volume overloaded on examination.

## 2014-05-31 NOTE — Patient Instructions (Signed)
Your physician recommends that you schedule a follow-up appointment in: AS NEEDED PENDING TEST RESULTS  Your physician has requested that you have an echocardiogram. Echocardiography is a painless test that uses sound waves to create images of your heart. It provides your doctor with information about the size and shape of your heart and how well your heart's chambers and valves are working. This procedure takes approximately one hour. There are no restrictions for this procedure.   Your physician has requested that you have a stress echocardiogram. For further information please visit [email or web address]. Please follow instruction sheet as given.

## 2014-06-12 ENCOUNTER — Ambulatory Visit (HOSPITAL_COMMUNITY): Payer: Medicare Other | Attending: Cardiovascular Disease | Admitting: Cardiology

## 2014-06-12 ENCOUNTER — Other Ambulatory Visit: Payer: Self-pay | Admitting: *Deleted

## 2014-06-12 ENCOUNTER — Ambulatory Visit (HOSPITAL_COMMUNITY): Payer: Medicare Other

## 2014-06-12 DIAGNOSIS — E785 Hyperlipidemia, unspecified: Secondary | ICD-10-CM | POA: Diagnosis not present

## 2014-06-12 DIAGNOSIS — R0602 Shortness of breath: Secondary | ICD-10-CM

## 2014-06-12 DIAGNOSIS — R0600 Dyspnea, unspecified: Secondary | ICD-10-CM

## 2014-06-12 DIAGNOSIS — R079 Chest pain, unspecified: Secondary | ICD-10-CM | POA: Insufficient documentation

## 2014-06-12 NOTE — Progress Notes (Signed)
Echo performed. 

## 2014-06-13 ENCOUNTER — Other Ambulatory Visit: Payer: Self-pay | Admitting: *Deleted

## 2014-06-13 MED ORDER — ALPRAZOLAM 1 MG PO TABS: ORAL_TABLET | ORAL | Status: DC

## 2014-06-13 NOTE — Telephone Encounter (Signed)
[name] Nolan   Call this pt and let her know that I changed the directions of her Xanax to 1/2 or one whole tablet at HS prn sleep only.  This is how she told me she was taking this medication when I first met her .  If she is taking this more often than above,  She needs to discuss this with me in office.    Advise her that it is best not to use Xanax every night as she will become dependent on this medication.

## 2014-06-13 NOTE — Telephone Encounter (Signed)
Patient called and requested a refill on her Xanax however per her record you have never prescribed the to her. I told her I would check with you before I could send this in. -eh

## 2014-06-14 NOTE — Telephone Encounter (Signed)
I spoke to [facility] and let her know that we called in the Xanax and informed her of the dose change. I also let her know this is to be taken only PRN not every night.-eh

## 2014-06-20 ENCOUNTER — Ambulatory Visit (HOSPITAL_COMMUNITY): Payer: Medicare Other | Attending: Cardiology | Admitting: Radiology

## 2014-06-20 VITALS — BP 136/92 | HR 64 | Ht 64.0 in | Wt 161.0 lb

## 2014-06-20 DIAGNOSIS — R0600 Dyspnea, unspecified: Secondary | ICD-10-CM

## 2014-06-20 DIAGNOSIS — R0602 Shortness of breath: Secondary | ICD-10-CM | POA: Insufficient documentation

## 2014-06-20 DIAGNOSIS — E785 Hyperlipidemia, unspecified: Secondary | ICD-10-CM | POA: Insufficient documentation

## 2014-06-20 DIAGNOSIS — R079 Chest pain, unspecified: Secondary | ICD-10-CM | POA: Diagnosis present

## 2014-06-20 MED ORDER — TECHNETIUM TC 99M SESTAMIBI GENERIC - CARDIOLITE
30.0000 | Freq: Once | INTRAVENOUS | Status: AC | PRN
  Administered 2014-06-20: 30 via INTRAVENOUS

## 2014-06-20 MED ORDER — TECHNETIUM TC 99M SESTAMIBI GENERIC - CARDIOLITE
10.0000 | Freq: Once | INTRAVENOUS | Status: AC | PRN
  Administered 2014-06-20: 10 via INTRAVENOUS

## 2014-06-20 NOTE — Progress Notes (Signed)
[facility] [address] [place], [place] [address] [phone]    Cardiology Nuclear Med Study  [name] Nolan is a 68 y.o. female     MRN : [id number]     DOB: Jan 19, 1946  Procedure Date: 06/20/2014  Nuclear Med Background Indication for Stress Test:  Evaluation for Ischemia and Abnormal EKG History: No prior known history of CAD, and 10 yrs ago Myocardial Perfusion Imaging-Normal Cardiac Risk Factors: Lipids   Symptoms: Chest Pain/Pressure with/without exertion (last occurrence last week), DOE and SOB   Nuclear Pre-Procedure Caffeine/Decaff Intake:  None NPO After: 7:00pm   Lungs:  clear O2 Sat: 98% on room air. IV 0.9% NS with Angio Cath:  22g  IV Site: L Wrist  IV Started by:  Matilde Haymaker, RN  Chest Size (in):  38 Cup Size: DD  Height: 5\' 4"  (1.626 m)  Weight:  161 lb (73.029 kg)  BMI:  Body mass index is 27.62 kg/(m^2). Tech Comments:  No Inderal x 24 hrs    Nuclear Med Study 1 or 2 day study: 1 day  Stress Test Type:  Stress  Reading MD: n/a  Order Authorizing Provider:  Queen Blossom  Resting Radionuclide: Technetium 76m Sestamibi  Resting Radionuclide Dose: 11.0 mCi   Stress Radionuclide:  Technetium 64m Sestamibi  Stress Radionuclide Dose: 33.0 mCi           Stress Protocol Rest HR: 64 Stress HR: 139  Rest BP: 136/92 Stress BP: 155/76  Exercise Time (min): 5:45 METS: 7.0   Predicted Max HR: 154 bpm % Max HR: 90.26 bpm Rate Pressure Product: 21545   Dose of Adenosine (mg):  n/a Dose of Lexiscan: n/a mg  Dose of Atropine (mg): n/a Dose of Dobutamine: n/a mcg/kg/min (at max HR)  Stress Test Technologist: Irven Baltimore, RN  Nuclear Technologist:  Earl Many, CNMT     Rest Procedure:  Myocardial perfusion imaging was performed at rest 45 minutes following the intravenous administration of Technetium 36m Sestamibi. Rest ECG: Normal sinus rhythm. Nonspecific ST-T wave changes.  Stress Procedure:  The  patient exercised on the treadmill utilizing the Bruce Protocol for 5:45 minutes, RPE=15. The patient stopped due to DOE and denied any chest pain.  Technetium 41m Sestamibi was injected at peak exercise and myocardial perfusion imaging was performed after a brief delay. Stress ECG: No significant change from baseline ECG  QPS Raw Data Images:  Normal; no motion artifact; normal heart/lung ratio. Stress Images:  Normal homogeneous uptake in all areas of the myocardium. Rest Images:  Normal homogeneous uptake in all areas of the myocardium. Subtraction (SDS):  No evidence of ischemia. Transient Ischemic Dilatation (Normal <1.22):  1.18 Lung/Heart Ratio (Normal <0.45):  0.35  Quantitative Gated Spect Images QGS EDV:  90 ml QGS ESV:  40 ml  Impression Exercise Capacity:  Fair exercise capacity. BP Response:  Normal blood pressure response. Clinical Symptoms:  Dyspnea on exertion ECG Impression:  No significant ST segment change suggestive of ischemia. Comparison with Prior Nuclear Study: No images to compare  Overall Impression:  Normal stress nuclear study. There is no scar or ischemia. This is a low risk scan.  LV Ejection Fraction: 56%.  LV Wall Motion:  Normal Wall Motion.  Dola Argyle, MD

## 2014-06-30 ENCOUNTER — Other Ambulatory Visit: Payer: Self-pay

## 2014-07-08 ENCOUNTER — Other Ambulatory Visit: Payer: Self-pay

## 2014-07-08 MED ORDER — AMITRIPTYLINE HCL 10 MG PO TABS: 10.0000 mg | ORAL_TABLET | Freq: Every day | ORAL | Status: DC

## 2014-07-14 ENCOUNTER — Other Ambulatory Visit: Payer: Self-pay | Admitting: Internal Medicine

## 2014-07-17 ENCOUNTER — Other Ambulatory Visit: Payer: Self-pay | Admitting: *Deleted

## 2014-07-17 MED ORDER — ALPRAZOLAM 1 MG PO TABS: ORAL_TABLET | ORAL | Status: DC

## 2014-07-17 NOTE — Telephone Encounter (Signed)
I received this message from the patient through "my Chart"  Dr. Coralyn Mark , I need a refill of this medication. I have been out of it now a little over a month. I have been taking this medication for years and I just cannot stop in a months time. I need this to help me sleep. I do not want to be weaned off of this medication. I do not abuse this medication, but I cannot sleep without taking one pill at night. My pharmacy has faxed at least 2 or more request to your office. Please acknowledge this for me. Thank you.   [place]

## 2014-07-17 NOTE — Telephone Encounter (Signed)
[facility]

## 2014-08-17 ENCOUNTER — Other Ambulatory Visit: Payer: Self-pay | Admitting: Nurse Practitioner

## 2014-08-18 ENCOUNTER — Other Ambulatory Visit: Payer: Self-pay

## 2014-08-18 MED ORDER — AMITRIPTYLINE HCL 10 MG PO TABS
10.0000 mg | ORAL_TABLET | Freq: Every day | ORAL | Status: DC
Start: 2014-08-18 — End: 2014-08-29

## 2014-08-24 ENCOUNTER — Ambulatory Visit (INDEPENDENT_AMBULATORY_CARE_PROVIDER_SITE_OTHER): Payer: Medicare Other | Admitting: Internal Medicine

## 2014-08-24 ENCOUNTER — Encounter: Payer: Self-pay | Admitting: Internal Medicine

## 2014-08-24 ENCOUNTER — Other Ambulatory Visit: Payer: Self-pay | Admitting: Internal Medicine

## 2014-08-24 VITALS — BP 124/78 | HR 79 | Resp 16 | Ht 64.75 in | Wt 166.0 lb

## 2014-08-24 DIAGNOSIS — Z1231 Encounter for screening mammogram for malignant neoplasm of breast: Secondary | ICD-10-CM

## 2014-08-24 DIAGNOSIS — E2839 Other primary ovarian failure: Secondary | ICD-10-CM

## 2014-08-24 DIAGNOSIS — Z23 Encounter for immunization: Secondary | ICD-10-CM

## 2014-08-24 DIAGNOSIS — Z0189 Encounter for other specified special examinations: Secondary | ICD-10-CM

## 2014-08-24 DIAGNOSIS — Z1211 Encounter for screening for malignant neoplasm of colon: Secondary | ICD-10-CM

## 2014-08-24 DIAGNOSIS — Z0000 Encounter for general adult medical examination without abnormal findings: Secondary | ICD-10-CM

## 2014-08-24 LAB — HEMOCCULT GUIAC POC 1CARD (OFFICE): Fecal Occult Blood, POC: NEGATIVE

## 2014-08-24 LAB — POCT URINALYSIS DIPSTICK
Bilirubin, UA: NEGATIVE
Blood, UA: NEGATIVE
Clarity, UA: NEGATIVE
Glucose, UA: NEGATIVE
Ketones, UA: NEGATIVE
Leukocytes, UA: NEGATIVE
Nitrite, UA: NEGATIVE
Protein, UA: NEGATIVE
SPEC GRAV UA: 1.01
Urobilinogen, UA: NEGATIVE
pH, UA: 7

## 2014-08-24 NOTE — Patient Instructions (Addendum)
Will schedule barium swallow at GI [facility]  Will schedule 3d mammogran at the breast center  Along with Dexa   See me on Monday December 21st

## 2014-08-24 NOTE — Progress Notes (Signed)
Subjective:    Patient ID: [name] Nolan, female    DOB: May 31, 1946, 68 y.o.   MRN: [id number]  HPI 05/2014 cardiology note Dyspnea - Lelon Perla, MD at 05/31/2014 11:36 AM     Status: Written Related Problem: Dyspnea   Expand All Collapse All   Etiology unclear. Schedule echocardiogram to assess LV systolic and diastolic function. Schedule stress echocardiogram to exclude ischemia. No chest x-ray no acute disease. Not volume overloaded on examination.            Hyperlipidemia - Lelon Perla, MD at 05/31/2014 11:36 AM     Status: Written Related Problem: Hyperlipidemia   Expand All Collapse All   Management per primary care.      Semone is here for CPE HM:  She needs pneumovax, Prevnar Tdap and shingles.  Had pap 2013 Dr. Nolon Rod,  Has not had colonoscopy in many years,  She is a non-smoker  Kayslee reports she has had 3 episodes of difficulty swallowing.  One episode a bystander had to give the Heimlich .  Usually involves swallowing meat.  She does not describe acid reflux.  Stress with caring for ill husband who has Parkinson's   Migraine  Using Maxalt  Hypothyroidism  See U/s Dr. Buddy Duty follows her for this .   Allergies  Allergen Reactions  . Codeine   . Statins     intolerance  . Sulfa Antibiotics    Past Medical History  Diagnosis Date  . Hypothyroid   . Migraine   . Anxiety   . Occipital neuralgia   . Insomnia   . Esophageal reflux   . Prolonged depressive reaction   . Hyperlipidemia   . Menopause    Past Surgical History  Procedure Laterality Date  . Cholecystectomy    . Tubal ligation     History   Social History  . Marital Status: Married    Spouse Name: [name] Nolan    Number of Children: 1  . Years of Education: HS   Occupational History  . Retired    Social History Main Topics  . Smoking status: Never Smoker   . Smokeless tobacco: Never Used  . Alcohol Use: No  . Drug Use: No  . Sexual Activity: Yes    Birth  Control/ Protection: Post-menopausal   Other Topics Concern  . Not on file   Social History Narrative   Patient is married [name] Nolan) and lives at home with her husband.   Patient has one child.   Patient has a high school education.   Patient is right-handed.   Caffeine Use: Occasionally   Family History  Problem Relation Age of Onset  . Congestive Heart Failure Mother   . Leukemia Father   . Kidney disease Brother    Patient Active Problem List   Diagnosis Date Noted  . Dyspnea 05/31/2014  . Anxiety state, unspecified 05/07/2014  . Hyperlipidemia 05/07/2014  . Hypothyroidism 05/07/2014  . Allergic rhinitis 05/07/2014  . Migraine without aura 07/12/2013   Current Outpatient Prescriptions on File Prior to Visit  Medication Sig Dispense Refill  . ALPRAZolam (XANAX) 1 MG tablet Take 1/2 or one whole tablet hs prn sleep 30 tablet 1  . amitriptyline (ELAVIL) 10 MG tablet Take 1 tablet (10 mg total) by mouth at bedtime. 90 tablet 0  . calcium gluconate 500 MG tablet Take 1 tablet by mouth 2 (two) times daily.    Marland Kitchen co-enzyme Q-10 50 MG capsule Take 100 mg by mouth  daily.    . escitalopram (LEXAPRO) 10 MG tablet Take 0.5 tablets by mouth at bedtime.    Marland Kitchen levothyroxine (SYNTHROID, LEVOTHROID) 75 MCG tablet Take 75 mcg by mouth daily before breakfast.    . magnesium gluconate (MAGONATE) 500 MG tablet Take 500 mg by mouth daily.     . rizatriptan (MAXALT-MLT) 10 MG disintegrating tablet Take 1 tablet (10 mg total) by mouth as needed for migraine. May repeat in 2 hours if needed 15 tablet 11  . zolmitriptan (ZOMIG) 5 MG tablet Take 1 tablet (5 mg total) by mouth as needed for migraine. 12 tablet 5   No current facility-administered medications on file prior to visit.       Review of Systems  Respiratory: Negative for cough, chest tightness, shortness of breath, wheezing and stridor.   Cardiovascular: Negative for chest pain, palpitations and leg swelling.  Gastrointestinal:  Negative for abdominal pain.  All other systems reviewed and are negative.      Objective:   Physical Exam Physical Exam  Nursing note and vitals reviewed.  Constitutional: She is oriented to person, place, and time. She appears well-developed and well-nourished.  HENT:  Head: Normocephalic and atraumatic.  Right Ear: Tympanic membrane and ear canal normal. No drainage. Tympanic membrane is not injected and not erythematous.  Left Ear: Tympanic membrane and ear canal normal. No drainage. Tympanic membrane is not injected and not erythematous.  Nose: Nose normal. Right sinus exhibits no maxillary sinus tenderness and no frontal sinus tenderness. Left sinus exhibits no maxillary sinus tenderness and no frontal sinus tenderness.  Mouth/Throat: Oropharynx is clear and moist. No oral lesions. No oropharyngeal exudate.  Eyes: Conjunctivae and EOM are normal. Pupils are equal, round, and reactive to light.  Neck: Normal range of motion. Neck supple. No JVD present. Carotid bruit is not present. No mass and no thyromegaly present.  Cardiovascular: Normal rate, regular rhythm, S1 normal, S2 normal and intact distal pulses. Exam reveals no gallop and no friction rub.  No murmur heard.  Pulses:  Carotid pulses are 2+ on the right side, and 2+ on the left side.  Dorsalis pedis pulses are 2+ on the right side, and 2+ on the left side.  No carotid bruit. No LE edema  Pulmonary/Chest: Breath sounds normal. She has no wheezes. She has no rales. She exhibits no tenderness.  Breast no discrete mass no nipple discharge no axillary adenopathy bilataerally  Abdominal: Soft. Bowel sounds are normal. She exhibits no distension and no mass. There is no hepatosplenomegaly. There is no tenderness. There is no CVA tenderness.  REctal no mass guaiac neg Musculoskeletal: Normal range of motion.  No active synovitis to joints.  Lymphadenopathy:  She has no cervical adenopathy.  She has no axillary adenopathy.    Right: No inguinal and no supraclavicular adenopathy present.  Left: No inguinal and no supraclavicular adenopathy present.  Neurological: She is alert and oriented to person, place, and time. She has normal strength and normal reflexes. She displays no tremor. No cranial nerve deficit or sensory deficit. Coordination and gait normal.  Skin: Skin is warm and dry. No rash noted. No cyanosis. Nails show no clubbing.  Psychiatric: She has a normal mood and affect. Her speech is normal and behavior is normal. Cognition and memory are normal.           Assessment & Plan:  HM:  Tdap today , had flu vaccine,  RX given for Zostavax,  Will eventually need colonoscopy . Will  schedule 3Dmm  Dysphagia  Will schedule barium swallow  Hyperlipdemia:  Will recheck fasting level furhter management based on results  Hypothyroidism/thyroid nodule  Manage by Dr. Greer Ee normal   Migriane headache  OK to take her Maxalt with Ibuprofen 800 mg .  If this does not help she is to call her neurologist  Dr. Rayvon Char  See me back 12/21  Will need GI referral at that time

## 2014-08-25 ENCOUNTER — Encounter: Payer: Self-pay | Admitting: *Deleted

## 2014-08-28 ENCOUNTER — Other Ambulatory Visit: Payer: Self-pay | Admitting: Diagnostic Neuroimaging

## 2014-08-28 ENCOUNTER — Encounter: Payer: Self-pay | Admitting: Internal Medicine

## 2014-08-28 ENCOUNTER — Other Ambulatory Visit: Payer: Self-pay

## 2014-08-28 NOTE — Telephone Encounter (Signed)
[name] Nolan [phone] [facility]  [place] left a message to see if she could get you do a refill on her escitalopram (LEXAPRO) 10 MG tablet, she stated that someone in Dr Kaleen Mask office called it in for her. She also stated she is completely out and needs this.

## 2014-08-28 NOTE — Telephone Encounter (Signed)
Refill request

## 2014-08-29 MED ORDER — ESCITALOPRAM OXALATE 10 MG PO TABS: 5.0000 mg | ORAL_TABLET | Freq: Every day | ORAL | Status: DC

## 2014-08-29 MED ORDER — AMITRIPTYLINE HCL 10 MG PO TABS: 10.0000 mg | ORAL_TABLET | Freq: Every day | ORAL | Status: DC

## 2014-08-29 NOTE — Telephone Encounter (Signed)
rx renewed. -VRP

## 2014-08-30 ENCOUNTER — Encounter: Payer: Self-pay | Admitting: *Deleted

## 2014-09-01 LAB — LIPID PANEL
Cholesterol: 218 mg/dL — ABNORMAL HIGH (ref 0–200)
HDL: 57 mg/dL (ref >39)
LDL Cholesterol: 134 mg/dL — ABNORMAL HIGH (ref 0–99)
TRIGLYCERIDES: 134 mg/dL (ref <150)
Total CHOL/HDL Ratio: 3.8 Ratio
VLDL: 27 mg/dL (ref 0–40)

## 2014-09-02 LAB — VITAMIN D 25 HYDROXY (VIT D DEFICIENCY, FRACTURES): Vit D, 25-Hydroxy: 27 ng/mL — ABNORMAL LOW (ref 30–100)

## 2014-09-03 NOTE — Progress Notes (Signed)
Subjective:    Patient ID: [name] Nolan, female    DOB: 01-Dec-1945, 68 y.o.   MRN: [id number]  HPI 08/24/2014 HM: Tdap today , had flu vaccine, RX given for Zostavax, Will eventually need colonoscopy . Will schedule 3Dmm  Dysphagia Will schedule barium swallow  Hyperlipdemia: Will recheck fasting level furhter management based on results  Hypothyroidism/thyroid nodule Manage by Dr. Greer Ee normal   Migriane headache OK to take her Maxalt with Ibuprofen 800 mg . If this does not help she is to call her neurologist Dr. Rayvon Char  See me back 12/21 Will need GI referral at that time   TODAY:  Hyperlipidemia  Lipids improved  She does not want RX meds now   Has not had ba Swallow as yet .  Still has dysphagia at times   Allergies  Allergen Reactions  . Codeine   . Statins     intolerance  . Sulfa Antibiotics    Past Medical History  Diagnosis Date  . Hypothyroid   . Migraine   . Anxiety   . Occipital neuralgia   . Insomnia   . Esophageal reflux   . Prolonged depressive reaction   . Hyperlipidemia   . Menopause    Past Surgical History  Procedure Laterality Date  . Cholecystectomy    . Tubal ligation     History   Social History  . Marital Status: Married    Spouse Name: [name]    Number of Children: 1  . Years of Education: HS   Occupational History  . Retired    Social History Main Topics  . Smoking status: Never Smoker   . Smokeless tobacco: Never Used  . Alcohol Use: No  . Drug Use: No  . Sexual Activity: Yes    Birth Control/ Protection: Post-menopausal   Other Topics Concern  . Not on file   Social History Narrative   Patient is married [name] Nolan) and lives at home with her husband.   Patient has one child.   Patient has a high school education.   Patient is right-handed.   Caffeine Use: Occasionally   Family History  Problem Relation Age of Onset  . Congestive Heart Failure Mother   . Leukemia Father   . Kidney  disease Brother    Patient Active Problem List   Diagnosis Date Noted  . Dyspnea 05/31/2014  . Anxiety state, unspecified 05/07/2014  . Hyperlipidemia 05/07/2014  . Hypothyroidism 05/07/2014  . Allergic rhinitis 05/07/2014  . Migraine without aura 07/12/2013   Current Outpatient Prescriptions on File Prior to Visit  Medication Sig Dispense Refill  . ALPRAZolam (XANAX) 1 MG tablet Take 1/2 or one whole tablet hs prn sleep 30 tablet 1  . amitriptyline (ELAVIL) 10 MG tablet Take 1 tablet (10 mg total) by mouth at bedtime. 90 tablet 1  . calcium gluconate 500 MG tablet Take 1 tablet by mouth 2 (two) times daily.    Marland Kitchen co-enzyme Q-10 50 MG capsule Take 100 mg by mouth daily.    Marland Kitchen escitalopram (LEXAPRO) 10 MG tablet Take 0.5 tablets (5 mg total) by mouth at bedtime. 30 tablet 5  . levothyroxine (SYNTHROID, LEVOTHROID) 75 MCG tablet Take 75 mcg by mouth daily before breakfast.    . magnesium gluconate (MAGONATE) 500 MG tablet Take 500 mg by mouth daily.     . rizatriptan (MAXALT-MLT) 10 MG disintegrating tablet Take 1 tablet (10 mg total) by mouth as needed for migraine. May repeat in 2 hours  if needed 15 tablet 11  . zolmitriptan (ZOMIG) 5 MG tablet Take 1 tablet (5 mg total) by mouth as needed for migraine. 12 tablet 5   No current facility-administered medications on file prior to visit.       Review of Systems    see HPI Objective:   Physical Exam  Physical Exam  Nursing note and vitals reviewed.  Constitutional: She is oriented to person, place, and time. She appears well-developed and well-nourished.  HENT:  Head: Normocephalic and atraumatic.  Cardiovascular: Normal rate and regular rhythm. Exam reveals no gallop and no friction rub.  No murmur heard.  Pulmonary/Chest: Breath sounds normal. She has no wheezes. She has no rales.  Neurological: She is alert and oriented to person, place, and time.  Skin: Skin is warm and dry.  Psychiatric: She has a normal mood and affect.  Her behavior is normal.             Assessment & Plan:

## 2014-09-04 ENCOUNTER — Ambulatory Visit (INDEPENDENT_AMBULATORY_CARE_PROVIDER_SITE_OTHER): Payer: Medicare Other | Admitting: Internal Medicine

## 2014-09-04 ENCOUNTER — Encounter: Payer: Self-pay | Admitting: Internal Medicine

## 2014-09-04 VITALS — BP 130/73 | HR 66 | Resp 16 | Ht 64.75 in | Wt 166.0 lb

## 2014-09-04 DIAGNOSIS — R1310 Dysphagia, unspecified: Secondary | ICD-10-CM

## 2014-09-04 DIAGNOSIS — E785 Hyperlipidemia, unspecified: Secondary | ICD-10-CM

## 2014-09-04 DIAGNOSIS — Z23 Encounter for immunization: Secondary | ICD-10-CM

## 2014-09-04 DIAGNOSIS — Z1211 Encounter for screening for malignant neoplasm of colon: Secondary | ICD-10-CM

## 2014-09-04 MED ORDER — ESCITALOPRAM OXALATE 10 MG PO TABS: 5.0000 mg | ORAL_TABLET | Freq: Every day | ORAL | Status: DC

## 2014-09-04 NOTE — Patient Instructions (Signed)
See me as needed  Will refer to GI MD for screening colonoscopy

## 2014-09-12 ENCOUNTER — Other Ambulatory Visit: Payer: Self-pay

## 2014-09-12 ENCOUNTER — Ambulatory Visit
Admission: RE | Admit: 2014-09-12 | Discharge: 2014-09-12 | Disposition: A | Discharge status: Home / Self Care | Payer: Medicare Other | Source: Ambulatory Visit | Attending: Internal Medicine | Admitting: Internal Medicine

## 2014-09-12 ENCOUNTER — Other Ambulatory Visit: Payer: Self-pay | Admitting: Internal Medicine

## 2014-09-12 DIAGNOSIS — E2839 Other primary ovarian failure: Secondary | ICD-10-CM

## 2014-09-12 DIAGNOSIS — Z0000 Encounter for general adult medical examination without abnormal findings: Secondary | ICD-10-CM

## 2014-09-18 ENCOUNTER — Encounter: Payer: Self-pay | Admitting: Gastroenterology

## 2014-09-18 ENCOUNTER — Ambulatory Visit
Admission: RE | Admit: 2014-09-18 | Discharge: 2014-09-18 | Disposition: A | Discharge status: Home / Self Care | Payer: 59 | Source: Ambulatory Visit | Attending: Internal Medicine | Admitting: Internal Medicine

## 2014-09-18 ENCOUNTER — Telehealth: Payer: Self-pay | Admitting: Diagnostic Neuroimaging

## 2014-09-18 ENCOUNTER — Telehealth: Payer: Self-pay | Admitting: Internal Medicine

## 2014-09-18 DIAGNOSIS — E2839 Other primary ovarian failure: Secondary | ICD-10-CM

## 2014-09-18 DIAGNOSIS — R1314 Dysphagia, pharyngoesophageal phase: Secondary | ICD-10-CM

## 2014-09-18 DIAGNOSIS — Z1231 Encounter for screening mammogram for malignant neoplasm of breast: Secondary | ICD-10-CM

## 2014-09-18 NOTE — Telephone Encounter (Signed)
Spoke with pt and given BA swallow results no discrete anatomic obsruction    Continue Protonix  Will refer to GI

## 2014-09-18 NOTE — Telephone Encounter (Signed)
Patient stated Rx's  rizatriptan (MAXALT-MLT) 10 MG disintegrating tablet and zolmitriptan (ZOMIG) 5 MG tablet not helping with Migraines.  Patient states she gets Migraines every am between 1 and 4 am.  Patient completely out of medication and requesting alternative medication for pain until appointment on 09/25/14.  Please call and advise.

## 2014-09-18 NOTE — Telephone Encounter (Signed)
Spoke to patient. Moved to an earlier appt on 09/20/2014.

## 2014-09-18 NOTE — Telephone Encounter (Signed)
Left message for Madlynn to call me back - scheduled an appointment with [facility] for 10-31-14 at 10am

## 2014-09-18 NOTE — Telephone Encounter (Signed)
Will discuss with patient at office visit. Can move visit soon if possible. -VRP

## 2014-09-20 ENCOUNTER — Encounter: Payer: Self-pay | Admitting: Diagnostic Neuroimaging

## 2014-09-20 ENCOUNTER — Telehealth: Payer: Self-pay | Admitting: *Deleted

## 2014-09-20 ENCOUNTER — Ambulatory Visit (INDEPENDENT_AMBULATORY_CARE_PROVIDER_SITE_OTHER): Payer: 59 | Admitting: Diagnostic Neuroimaging

## 2014-09-20 VITALS — BP 130/89 | HR 62 | Temp 98.2°F | Ht 64.75 in | Wt 171.0 lb

## 2014-09-20 DIAGNOSIS — R519 Headache, unspecified: Secondary | ICD-10-CM

## 2014-09-20 DIAGNOSIS — IMO0002 Reserved for concepts with insufficient information to code with codable children: Secondary | ICD-10-CM

## 2014-09-20 DIAGNOSIS — R51 Headache: Secondary | ICD-10-CM

## 2014-09-20 DIAGNOSIS — G43709 Chronic migraine without aura, not intractable, without status migrainosus: Secondary | ICD-10-CM

## 2014-09-20 DIAGNOSIS — G43019 Migraine without aura, intractable, without status migrainosus: Secondary | ICD-10-CM

## 2014-09-20 MED ORDER — AMITRIPTYLINE HCL 50 MG PO TABS: 50.0000 mg | ORAL_TABLET | Freq: Every day | ORAL | Status: DC

## 2014-09-20 NOTE — Telephone Encounter (Signed)
I spoke with [name] Nolan and gave her the DEXA scan results-eh

## 2014-09-20 NOTE — Telephone Encounter (Signed)
-----   Message from Lanice Shirts, MD sent at 09/20/2014  7:42 AM EST ----- Call [facility] and let her know that her bone density test shows osteopenia - early thinning   No osteoporosis  Just take calcium 1200-1500 mg and vitamin D [id number] units daily

## 2014-09-20 NOTE — Progress Notes (Signed)
PATIENT: [name] Nolan DOB: 06-09-1946  REASON FOR VISIT: follow up for Migraines HISTORY FROM: patient  Chief Complaint  Patient presents with  . Follow-up    migraine     HISTORY OF PRESENT ILLNESS:  UPDATE 09/20/14: Since last visit, now off topiramate and propranolol due to side effects. Having daily migraines b/w 1-3am. Triptans not working as well. Also uses ibuprofen and exedrin migraine. Family stress issues are better.   UPDATE 03/22/14: Since last visit, continues with chronic daily headche; also with intermittent migraine (3 per week). Now seeing a counselor. Asking about zomig, maxalt, amerge. Tells me that she took amerge in the past without reaction. Also started on magnesium. Has stopped caffeine, dairy, cheese, MSG, artificial sweeteners. Stress is finally stabilizing.  UPDATE 11/24/13 (LL):  Patient's daughter calls for sooner revisit, she feels her mother is much worse, headaches are constant and everyday, and she is seeing more confusion, more severe depression, and thinks her mother is over-medicating.   Some days when she talked to her mother on the phone she had slurred speech, compelling her to take her mother to see the PCP with all of her medicine bottles. The medicines were reviewed and all sedating meds were removed.  The PCP recommended memory testing.  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  She states that she has no joy in her life. She recently found out that her younger brother has stage 4 cancer and was devastated by the news.    UPDATE 09/20/13 LL: [name] Nolan returns for revisit. Gabapentin has helped relieve severity of headaches, but they are still present, Excedrin Migraine is the only thing that she takes that relieves them. She has married her friend and still has a lot of stress because her daughter will not  accept the marriage. She is going still to grief counseling for her former husband. Headaches she thinks are triggered by stress; she feels a lot of stress in her neck and shoulders, She wakes with dull headache on the top of her head and then it moves behind her eyes. Overall, she thinks they are better than last visit.  UPDATE 07/12/13 (VP): Her last visit patient's husband has passed away and patient was under significant stress. Patient then had significant stress with reported lack of family support. Patient then started dating and other long time friend, whom she is now engaged to. They're planning to get married soon. Patient continues to have significant headaches over the past 5 weeks. She's tried steroids and Toradol without significant relief. Now she has a dull nagging headache. She's been taking Excedrin Migraine, Motrin, Aleve without benefit. She's having at least one severe headache per week. She has some nausea, photophobia, phonophobia. Headache is on left side but sometimes reduce the right side.   UPDATE 12/26/10: Doing about the same. Headaches much better on gabapentin. Took hydrocodone last night for left leg pain, and today has worse headache. Left hip/leg/knee pain still persistent.   PRIOR HPI (10/01/10): 69 year old right-handed female with migraines, hypercholesterolemia, depression, anxiety, here for evaluation of intractable headaches. She is here with her daughter and granddaughter for this visit. Patient reports history of migraine headaches since age 38 years old. She describes unilateral, severe headaches associated with nausea, photophobia and phonophobia. Left side is more affected than the right side. She had approximately 3-4 migraine  headaches per month until menopause and then her headaches subsided. She has tried Imitrex in the past but developed throat swelling and had to stop taking it. She has also tried Fioricet, Phenergan and Topamax without good relief. However in  winter 2010 patient fell down, struck her back and head, and since that time she has had increasing headaches. She now reports daily headaches that are different from her prior migraine headaches. She has been taking Excedrin Migraine, 2-4 tablets every day for the past 5 months. She also takes Aleve and Tylenol for arthritis pain on daily basis. Psychosocial factors include reports poor sleep, increased irritability, increased stress, especially related to her husbands medical condition of dementia with Lewy bodies.   REVIEW OF SYSTEMS: Full 14 system review of systems performed and notable only for headache anxiety blurred vision ringing in ears trouble swallowing.   ALLERGIES: Allergies  Allergen Reactions  . Codeine   . Statins     intolerance  . Sulfa Antibiotics     HOME MEDICATIONS: Outpatient Prescriptions Prior to Visit  Medication Sig Dispense Refill  . ALPRAZolam (XANAX) 1 MG tablet Take 1/2 or one whole tablet hs prn sleep 30 tablet 1  . calcium gluconate 500 MG tablet Take 1 tablet by mouth 2 (two) times daily.    Marland Kitchen co-enzyme Q-10 50 MG capsule Take 100 mg by mouth daily.    Marland Kitchen escitalopram (LEXAPRO) 10 MG tablet Take 0.5 tablets (5 mg total) by mouth at bedtime. 90 tablet 2  . levothyroxine (SYNTHROID, LEVOTHROID) 75 MCG tablet Take 75 mcg by mouth daily before breakfast.    . magnesium gluconate (MAGONATE) 500 MG tablet Take 500 mg by mouth daily.     . rizatriptan (MAXALT-MLT) 10 MG disintegrating tablet Take 1 tablet (10 mg total) by mouth as needed for migraine. May repeat in 2 hours if needed 15 tablet 11  . amitriptyline (ELAVIL) 10 MG tablet Take 1 tablet (10 mg total) by mouth at bedtime. 90 tablet 1  . zolmitriptan (ZOMIG) 5 MG tablet Take 1 tablet (5 mg total) by mouth as needed for migraine. 12 tablet 5   No facility-administered medications prior to visit.     PHYSICAL EXAM  Filed Vitals:   09/20/14 1410  BP: 130/89  Pulse: 62  Temp: 98.2 F (36.8 C)    TempSrc: Oral  Height: 5' 4.75" (1.645 m)  Weight: 171 lb (77.565 kg)   Body mass index is 28.66 kg/(m^2).  Generalized: Well developed, in no acute distress  Neck: Supple, no carotid bruits  Cardiac: Regular rate rhythm, no murmur   NEUROLOGIC:  MENTAL STATUS: awake, alert, language fluent, comprehension intact, naming intact  CRANIAL NERVE:  pupils equal and reactive to light, visual fields full to confrontation, extraocular muscles intact, no nystagmus, facial sensation and strength symmetric, uvula midline, shoulder shrug symmetric, tongue midline.  MOTOR: normal bulk and tone, full strength in the BUE, BLE  SENSORY: normal and symmetric to light touch, pinprick, temperature, vibration  COORDINATION: finger-nose-finger, fine finger movements normal  REFLEXES: deep tendon reflexes present and symmetric  GAIT/STATION: narrow based gait; able tandem; romberg is negative  ASSESSMENT AND PLAN 69 y.o. year old female  with worsening headaches in setting of increased psychosocial stressors. Most likely represents migraine + chronic daily headache. MRI in August 2014 was unremarkable.    PLAN:  - increase amitritpyline to 50mg  qhs - rizatriptan prn - stop zomig  Meds ordered this encounter  Medications  . amitriptyline (ELAVIL) 50  MG tablet    Sig: Take 1 tablet (50 mg total) by mouth at bedtime.    Dispense:  30 tablet    Refill:  12   Return in about 6 weeks (around 11/01/2014).  Penni Bombard, MD 7/0/1410, 3:01 PM Certified in Neurology, Neurophysiology and Neuroimaging  [facility] [address], [place], [place] [address] [phone]

## 2014-09-20 NOTE — Patient Instructions (Signed)
Increase amitriptyline to 50mg  at bedtime.  Continue rizatriptan as needed.

## 2014-09-20 NOTE — Telephone Encounter (Deleted)
Bronnie  [phone]  Elleigh returned your call

## 2014-09-25 ENCOUNTER — Ambulatory Visit: Payer: Medicare Other | Admitting: Diagnostic Neuroimaging

## 2014-09-26 ENCOUNTER — Other Ambulatory Visit: Payer: Self-pay | Admitting: *Deleted

## 2014-09-26 NOTE — Telephone Encounter (Signed)
Refill request

## 2014-09-27 ENCOUNTER — Telehealth: Payer: Self-pay | Admitting: Internal Medicine

## 2014-09-27 MED ORDER — ALPRAZOLAM 1 MG PO TABS
ORAL_TABLET | ORAL | Status: DC
Start: 2014-09-27 — End: 2014-10-31

## 2014-09-27 NOTE — Telephone Encounter (Signed)
[name] Nolan  Call pt and give her the appointment time for  GI  She is to see Dr. Deatra Ina at 10 am on 2/16.  Not sure if Mariann Laster gave her the message

## 2014-09-27 NOTE — Telephone Encounter (Signed)
Patient is aware of her appointment with Dr. Deatra Ina

## 2014-10-31 ENCOUNTER — Ambulatory Visit: Admitting: Gastroenterology

## 2014-10-31 ENCOUNTER — Other Ambulatory Visit: Payer: Self-pay | Admitting: *Deleted

## 2014-10-31 MED ORDER — ESCITALOPRAM OXALATE 10 MG PO TABS: 10.0000 mg | ORAL_TABLET | Freq: Every day | ORAL | Status: DC

## 2014-10-31 MED ORDER — ALPRAZOLAM 1 MG PO TABS: ORAL_TABLET | ORAL | Status: DC

## 2014-10-31 NOTE — Telephone Encounter (Signed)
R/X phoned in.

## 2014-10-31 NOTE — Telephone Encounter (Signed)
[name] Nolan called and asked that her Lexapro be increased to taking the whole 10mg  tablet instead of half

## 2014-11-01 ENCOUNTER — Ambulatory Visit (INDEPENDENT_AMBULATORY_CARE_PROVIDER_SITE_OTHER): Payer: 59 | Admitting: Diagnostic Neuroimaging

## 2014-11-01 ENCOUNTER — Encounter: Payer: Self-pay | Admitting: Diagnostic Neuroimaging

## 2014-11-01 VITALS — BP 119/81 | HR 90 | Ht 65.0 in | Wt 182.4 lb

## 2014-11-01 DIAGNOSIS — G43709 Chronic migraine without aura, not intractable, without status migrainosus: Secondary | ICD-10-CM

## 2014-11-01 DIAGNOSIS — R51 Headache: Secondary | ICD-10-CM | POA: Diagnosis not present

## 2014-11-01 DIAGNOSIS — IMO0002 Reserved for concepts with insufficient information to code with codable children: Secondary | ICD-10-CM

## 2014-11-01 DIAGNOSIS — R519 Headache, unspecified: Secondary | ICD-10-CM

## 2014-11-01 NOTE — Patient Instructions (Signed)
Continue current medications. 

## 2014-11-01 NOTE — Progress Notes (Signed)
PATIENT: [name] Nolan DOB: [birth year]  REASON FOR VISIT: follow up for Migraines HISTORY FROM: patient  Chief Complaint  Patient presents with  . Follow-up    chronic daily headache      HISTORY OF PRESENT ILLNESS:  UPDATE 11/01/14 (VRP): Since last visit, doing better on amitriptyline. No HA in last 3 weeks. Sleeping better.   UPDATE 09/20/14: Since last visit, now off topiramate and propranolol due to side effects. Having daily migraines b/w 1-3am. Triptans not working as well. Also uses ibuprofen and exedrin migraine. Family stress issues are better.   UPDATE 03/22/14: Since last visit, continues with chronic daily headche; also with intermittent migraine (3 per week). Now seeing a counselor. Asking about zomig, maxalt, amerge. Tells me that she took amerge in the past without reaction. Also started on magnesium. Has stopped caffeine, dairy, cheese, MSG, artificial sweeteners. Stress is finally stabilizing.  UPDATE 11/24/13 (LL):  Patient's daughter calls for sooner revisit, she feels her mother is much worse, headaches are constant and everyday, and she is seeing more confusion, more severe depression, and thinks her mother is over-medicating.   Some days when she talked to her mother on the phone she had slurred speech, compelling her to take her mother to see the PCP with all of her medicine bottles. The medicines were reviewed and all sedating meds were removed.  The PCP recommended memory testing.  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  She states that she has no joy in her life. She recently found out that her younger brother has stage 4 cancer and was devastated by the news.    UPDATE 09/20/13 LL: Mrs. Rayborn returns for revisit. Gabapentin has helped relieve severity of headaches, but they are still present, Excedrin Migraine is the only  thing that she takes that relieves them. She has married her friend and still has a lot of stress because her daughter will not accept the marriage. She is going still to grief counseling for her former husband. Headaches she thinks are triggered by stress; she feels a lot of stress in her neck and shoulders, She wakes with dull headache on the top of her head and then it moves behind her eyes. Overall, she thinks they are better than last visit.  UPDATE 07/12/13 (VP): Her last visit patient's husband has passed away and patient was under significant stress. Patient then had significant stress with reported lack of family support. Patient then started dating and other long time friend, whom she is now engaged to. They're planning to get married soon. Patient continues to have significant headaches over the past 5 weeks. She's tried steroids and Toradol without significant relief. Now she has a dull nagging headache. She's been taking Excedrin Migraine, Motrin, Aleve without benefit. She's having at least one severe headache per week. She has some nausea, photophobia, phonophobia. Headache is on left side but sometimes reduce the right side.   UPDATE 12/26/10: Doing about the same. Headaches much better on gabapentin. Took hydrocodone last night for left leg pain, and today has worse headache. Left hip/leg/knee pain still persistent.   PRIOR HPI (10/01/10): 69 year old right-handed female with migraines, hypercholesterolemia, depression, anxiety, here for evaluation of intractable headaches. She is here with her daughter and granddaughter for this visit. Patient reports history of migraine headaches since age 10 years old. She describes  unilateral, severe headaches associated with nausea, photophobia and phonophobia. Left side is more affected than the right side. She had approximately 3-4 migraine headaches per month until menopause and then her headaches subsided. She has tried Imitrex in the past but developed  throat swelling and had to stop taking it. She has also tried Fioricet, Phenergan and Topamax without good relief. However in winter 2010 patient fell down, struck her back and head, and since that time she has had increasing headaches. She now reports daily headaches that are different from her prior migraine headaches. She has been taking Excedrin Migraine, 2-4 tablets every day for the past 5 months. She also takes Aleve and Tylenol for arthritis pain on daily basis. Psychosocial factors include reports poor sleep, increased irritability, increased stress, especially related to her husbands medical condition of dementia with Lewy bodies.    REVIEW OF SYSTEMS: Full 14 system review of systems performed and notable only for HA.   ALLERGIES: Allergies  Allergen Reactions  . Codeine   . Statins     intolerance  . Sulfa Antibiotics     HOME MEDICATIONS: Outpatient Prescriptions Prior to Visit  Medication Sig Dispense Refill  . ALPRAZolam (XANAX) 1 MG tablet Take 1/2 or one whole tablet hs prn sleep 30 tablet 1  . amitriptyline (ELAVIL) 50 MG tablet Take 1 tablet (50 mg total) by mouth at bedtime. 30 tablet 12  . calcium gluconate 500 MG tablet Take 1 tablet by mouth 2 (two) times daily.    Marland Kitchen co-enzyme Q-10 50 MG capsule Take 100 mg by mouth daily.    Marland Kitchen escitalopram (LEXAPRO) 10 MG tablet Take 1 tablet (10 mg total) by mouth at bedtime. 90 tablet 0  . levothyroxine (SYNTHROID, LEVOTHROID) 75 MCG tablet Take 75 mcg by mouth daily before breakfast.    . magnesium gluconate (MAGONATE) 500 MG tablet Take 500 mg by mouth daily.     . rizatriptan (MAXALT-MLT) 10 MG disintegrating tablet Take 1 tablet (10 mg total) by mouth as needed for migraine. May repeat in 2 hours if needed 15 tablet 11   No facility-administered medications prior to visit.     PHYSICAL EXAM  Filed Vitals:   11/01/14 1312  BP: 119/81  Pulse: 90  Height: 5\' 5"  (1.651 m)  Weight: 182 lb 6.4 oz (82.736 kg)   Body mass  index is 30.35 kg/(m^2).  Generalized: Well developed, in no acute distress  Neck: Supple, no carotid bruits  Cardiac: Regular rate rhythm, no murmur   NEUROLOGIC:  MENTAL STATUS: awake, alert, language fluent, comprehension intact, naming intact  CRANIAL NERVE:  pupils equal and reactive to light, visual fields full to confrontation, extraocular muscles intact, no nystagmus, facial sensation and strength symmetric, uvula midline, shoulder shrug symmetric, tongue midline.  MOTOR: normal bulk and tone, full strength in the BUE, BLE  SENSORY: normal and symmetric to light touch, temperature, vibration  COORDINATION: finger-nose-finger, fine finger movements normal  REFLEXES: deep tendon reflexes present and symmetric  GAIT/STATION: narrow based gait; able tandem; romberg is negative    ASSESSMENT AND PLAN 69 y.o. year old female  with worsening headaches in setting of increased psychosocial stressors. Most likely represents migraine + chronic daily headache. MRI in August 2014 was unremarkable. Now doing better on amitriptyline.   PLAN:  - continue amitritpyline to 50mg  qhs - rizatriptan prn  Return in about 4 months (around 03/02/2015).  Penni Bombard, MD 1/61/0960, 4:54 PM Certified in Neurology, Neurophysiology and Neuroimaging  [facility] [address] [place],  [address] [phone]

## 2014-11-29 ENCOUNTER — Other Ambulatory Visit: Payer: Self-pay | Admitting: *Deleted

## 2014-11-29 MED ORDER — ESCITALOPRAM OXALATE 10 MG PO TABS: 10.0000 mg | ORAL_TABLET | Freq: Every day | ORAL | Status: DC

## 2014-11-29 NOTE — Telephone Encounter (Signed)
Refill request

## 2014-12-13 ENCOUNTER — Ambulatory Visit (INDEPENDENT_AMBULATORY_CARE_PROVIDER_SITE_OTHER): Payer: Medicare Other | Admitting: Gastroenterology

## 2014-12-13 ENCOUNTER — Other Ambulatory Visit (INDEPENDENT_AMBULATORY_CARE_PROVIDER_SITE_OTHER): Payer: Medicare Other

## 2014-12-13 ENCOUNTER — Encounter: Payer: Self-pay | Admitting: Gastroenterology

## 2014-12-13 VITALS — BP 138/60 | HR 64 | Ht 65.0 in | Wt 181.6 lb

## 2014-12-13 DIAGNOSIS — R1314 Dysphagia, pharyngoesophageal phase: Secondary | ICD-10-CM | POA: Insufficient documentation

## 2014-12-13 DIAGNOSIS — R0602 Shortness of breath: Secondary | ICD-10-CM

## 2014-12-13 DIAGNOSIS — R0609 Other forms of dyspnea: Secondary | ICD-10-CM

## 2014-12-13 DIAGNOSIS — Z1211 Encounter for screening for malignant neoplasm of colon: Secondary | ICD-10-CM

## 2014-12-13 HISTORY — DX: Encounter for screening for malignant neoplasm of colon: Z12.11

## 2014-12-13 HISTORY — DX: Other forms of dyspnea: R06.09

## 2014-12-13 HISTORY — DX: Dysphagia, pharyngoesophageal phase: R13.14

## 2014-12-13 LAB — CBC WITH DIFFERENTIAL/PLATELET
Basophils Absolute: 0.1 10*3/uL (ref 0.0–0.1)
Basophils Relative: 1 % (ref 0.0–3.0)
Eosinophils Absolute: 0.1 10*3/uL (ref 0.0–0.7)
Eosinophils Relative: 2.6 % (ref 0.0–5.0)
HCT: 37.5 % (ref 36.0–46.0)
Hemoglobin: 12.4 g/dL (ref 12.0–15.0)
Lymphocytes Relative: 35.2 % (ref 12.0–46.0)
Lymphs Abs: 1.9 10*3/uL (ref 0.7–4.0)
MCHC: 33 g/dL (ref 30.0–36.0)
MCV: 83.2 fl (ref 78.0–100.0)
MONO ABS: 0.5 10*3/uL (ref 0.1–1.0)
Monocytes Relative: 9.1 % (ref 3.0–12.0)
NEUTROS ABS: 2.7 10*3/uL (ref 1.4–7.7)
Neutrophils Relative %: 52.1 % (ref 43.0–77.0)
PLATELETS: 283 10*3/uL (ref 150.0–400.0)
RBC: 4.51 Mil/uL (ref 3.87–5.11)
RDW: 15.6 % — ABNORMAL HIGH (ref 11.5–15.5)
WBC: 5.3 10*3/uL (ref 4.0–10.5)

## 2014-12-13 NOTE — Assessment & Plan Note (Signed)
Dysphagia to solids could be due to a proximal esophageal web that is not appreciated by x-ray.  Cricopharyngeal achalasia is also a consideration.  There does not appear to be extrinsic compression of the esophagus.  I doubt this is related to a motility disorder.  Recommendations #1 upper endoscopy with dilation as indicated #2 to consider modified barium swallowing function pending results of #1

## 2014-12-13 NOTE — Patient Instructions (Signed)
Go to the basement for labs today  You have been scheduled for an endoscopy. Please follow written instructions given to you at your visit today. If you use inhalers (even only as needed), please bring them with you on the day of your procedure. Your physician has requested that you go to www.startemmi.com and enter the access code given to you at your visit today. This web site gives a general overview about your procedure. However, you should still follow specific instructions given to you by our office regarding your preparation for the procedure.

## 2014-12-13 NOTE — Assessment & Plan Note (Signed)
Cardiac workup negative.  Plan to check CBC

## 2014-12-13 NOTE — Assessment & Plan Note (Signed)
Patient believes that she had a colonoscopy sometime in the past 10 years but is unsure.  Will check prior records.

## 2014-12-13 NOTE — Progress Notes (Signed)
_                                                                                                                History of Present Illness:  [name] is a pleasant 69 year old white female referred at the request of Dr. Coralyn Mark for evaluation of dysphagia.  On multiple occasions she has had food get stuck in the back for throat upon swallowing.  She feels that it neither goes up or down.  He denies odynophagia or pyrosis.  A barium swallow in December, which I reviewed, only demonstrated a nonspecific motility disorder.  A barium pill easily passed into the stomach.  She has to be careful to eat non-bulky foods.   Past Medical History  Diagnosis Date  . Hypothyroid   . Migraine   . Anxiety   . Occipital neuralgia   . Insomnia   . Esophageal reflux   . Prolonged depressive reaction   . Hyperlipidemia   . Menopause    Past Surgical History  Procedure Laterality Date  . Cholecystectomy    . Tubal ligation     family history includes Congestive Heart Failure in her mother; Kidney disease in her brother; Leukemia in her father. Current Outpatient Prescriptions  Medication Sig Dispense Refill  . ALPRAZolam (XANAX) 1 MG tablet Take 1/2 or one whole tablet hs prn sleep 30 tablet 1  . amitriptyline (ELAVIL) 50 MG tablet Take 1 tablet (50 mg total) by mouth at bedtime. 30 tablet 12  . calcium gluconate 500 MG tablet Take 1 tablet by mouth 2 (two) times daily.    Marland Kitchen co-enzyme Q-10 50 MG capsule Take 100 mg by mouth daily.    Marland Kitchen escitalopram (LEXAPRO) 10 MG tablet Take 1 tablet (10 mg total) by mouth at bedtime. 90 tablet 1  . levothyroxine (SYNTHROID, LEVOTHROID) 75 MCG tablet Take 75 mcg by mouth daily before breakfast.    . magnesium gluconate (MAGONATE) 500 MG tablet Take 500 mg by mouth daily.     . rizatriptan (MAXALT-MLT) 10 MG disintegrating tablet Take 1 tablet (10 mg total) by mouth as needed for migraine. May repeat in 2 hours if needed 15 tablet 11   No  current facility-administered medications for this visit.   Allergies as of 12/13/2014 - Review Complete 12/13/2014  Allergen Reaction Noted  . Codeine  07/12/2013  . Statins  05/07/2014  . Sulfa antibiotics  07/12/2013    reports that she has never smoked. She has never used smokeless tobacco. She reports that she does not drink alcohol or use illicit drugs.   Review of Systems:  she complains of dyspnea on exertion.  Cardiac workup including perfusion scan was normal.  Pertinent positive and negative review of systems were noted in the above HPI section. All other review of systems were otherwise negative.  Vital signs were reviewed in today's medical record Physical Exam: General: Well developed , well nourished, no acute distress Skin: anicteric Head: Normocephalic and atraumatic Eyes:  sclerae anicteric, EOMI Ears:  Normal auditory acuity Mouth: No deformity or lesions Neck: Supple, no masses or thyromegaly Lungs: Clear throughout to auscultation Heart: Regular rate and rhythm; no murmurs, rubs or bruits Abdomen: Soft, non tender and non distended. No masses, hepatosplenomegaly or hernias noted. Normal Bowel sounds Rectal:deferred Musculoskeletal: Symmetrical with no gross deformities  Skin: No lesions on visible extremities Pulses:  Normal pulses noted Extremities: No clubbing, cyanosis, edema or deformities noted Neurological: Alert oriented x 4, grossly nonfocal Cervical Nodes:  No significant cervical adenopathy Inguinal Nodes: No significant inguinal adenopathy Psychological:  Alert and cooperative. Normal mood and affect  See Assessment and Plan under Problem List

## 2014-12-22 ENCOUNTER — Encounter: Payer: Self-pay | Admitting: Gastroenterology

## 2015-01-03 ENCOUNTER — Other Ambulatory Visit: Payer: Self-pay | Admitting: *Deleted

## 2015-01-03 MED ORDER — ALPRAZOLAM 1 MG PO TABS: ORAL_TABLET | ORAL | Status: DC

## 2015-01-03 NOTE — Telephone Encounter (Signed)
Refill request

## 2015-01-04 NOTE — Telephone Encounter (Signed)
RX called in -eh

## 2015-01-09 ENCOUNTER — Telehealth: Payer: Self-pay | Admitting: *Deleted

## 2015-01-09 NOTE — Telephone Encounter (Signed)
Talked with pt and made an appt with for her with Dr. Jaynee Eagles tomorrow at 2:00pm. I told her to arrive 20 min early before appt time. Pt verbalized understanding.

## 2015-01-10 ENCOUNTER — Ambulatory Visit (INDEPENDENT_AMBULATORY_CARE_PROVIDER_SITE_OTHER): Payer: Medicare Other | Admitting: Neurology

## 2015-01-10 ENCOUNTER — Encounter: Payer: Self-pay | Admitting: Neurology

## 2015-01-10 VITALS — BP 145/89 | HR 65 | Temp 98.1°F | Ht 65.0 in | Wt 179.5 lb

## 2015-01-10 DIAGNOSIS — G441 Vascular headache, not elsewhere classified: Secondary | ICD-10-CM

## 2015-01-10 MED ORDER — VERAPAMIL HCL ER 120 MG PO CP24: 120.0000 mg | ORAL_CAPSULE | Freq: Every day | ORAL | Status: DC

## 2015-01-10 NOTE — Progress Notes (Signed)
[facility]    Provider:  Dr Jaynee Eagles Referring Provider: Lanice Shirts, * Primary Care Physician:  Penni Homans, MD  CC:  Migraine  HPI:  [name] Nolan is a 70 y.o. female here as a referral from Dr. Coralyn Mark for chronic migraine  Migraines started years ago. Worsening after menopause. Worsening after 2007. Worse with stress. She is a former patient of Dr. Leta Baptist. She has headaches every other day. They started in the occipital area with pain and travel. Pulsating, moves to behind the eyes. +light sensitivity. Wants to go into a dark room. +nause. No vomiting. They last up to all day long maybe longer. Trigger are spices, peanut butter, banana, chocolate. Tried amitriptyline, topiramate, propranolol. triptans don't work. Had side effects to amitriptyline. Blurry vision with the headaches. No signs of sleep apnea. Failed relpax, failed topamax, failed propranolol, Failed maxalt, failt zomig. No aura.  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   Review of Systems: Patient complains of symptoms per HPI as well as the following symptoms: weight gain, blurred vision, flushing, joint pain, ringing in ears, trouble swallowing, confusion, headache. Pertinent negatives per HPI. All others negative.   History   Social History  . Marital Status: Married    Spouse Name: [name]  . Number of Children: 1  . Years of Education: HS   Occupational History  . Retired    Social History Main Topics  . Smoking status: Never Smoker   . Smokeless tobacco: Never Used  . Alcohol Use: No  . Drug Use: No  . Sexual Activity: Yes    Birth Control/ Protection: Post-menopausal   Other Topics Concern  . Not on file   Social History Narrative   Patient is married [name]) and lives at home with her husband.   Patient has one child.   Patient has a high school education.   Patient is right-handed.   Caffeine Use: Occasionally    Family  History  Problem Relation Age of Onset  . Congestive Heart Failure Mother   . Leukemia Father   . Kidney disease Brother   . Melanoma Sister   . Lung cancer Brother     Past Medical History  Diagnosis Date  . Hypothyroid   . Migraine   . Anxiety   . Occipital neuralgia   . Insomnia   . Esophageal reflux   . Prolonged depressive reaction   . Hyperlipidemia   . Menopause     Past Surgical History  Procedure Laterality Date  . Cholecystectomy    . Tubal ligation    . Tooth implant      at least 5 years ago per pt    Current Outpatient Prescriptions  Medication Sig Dispense Refill  . ALPRAZolam (XANAX) 1 MG tablet Take 1/2 or one whole tablet hs prn sleep 30 tablet 1  . amitriptyline (ELAVIL) 50 MG tablet Take 1 tablet (50 mg total) by mouth at bedtime. 30 tablet 12  . escitalopram (LEXAPRO) 10 MG tablet Take 1 tablet (10 mg total) by mouth at bedtime. 90 tablet 1  . levothyroxine (SYNTHROID, LEVOTHROID) 75 MCG tablet Take 75 mcg by mouth daily before breakfast.    . rizatriptan (MAXALT-MLT) 10 MG disintegrating tablet Take 1 tablet (10 mg total) by mouth as needed for migraine. May repeat in 2 hours if needed 15 tablet 11  . calcium gluconate 500 MG tablet Take 1 tablet by mouth 2 (two) times daily.    Marland Kitchen  co-enzyme Q-10 50 MG capsule Take 100 mg by mouth daily.    . magnesium gluconate (MAGONATE) 500 MG tablet Take 500 mg by mouth daily.      No current facility-administered medications for this visit.    Allergies as of 01/10/2015 - Review Complete 01/10/2015  Allergen Reaction Noted  . Codeine  07/12/2013  . Statins  05/07/2014  . Sulfa antibiotics  07/12/2013    Vitals: BP 145/89 mmHg  Pulse 65  Temp(Src) 98.1 F (36.7 C)  Ht 5\' 5"  (1.651 m)  Wt 179 lb 8 oz (81.421 kg)  BMI 29.87 kg/m2 Last Weight:  Wt Readings from Last 1 Encounters:  01/10/15 179 lb 8 oz (81.421 kg)   Last Height:   Ht Readings from Last 1 Encounters:  01/10/15 5\' 5"  (1.651 m)    Speech:    Speech is normal; fluent and spontaneous with normal comprehension.  Cognition:    The patient is oriented to person, place, and time;     recent and remote memory intact;     language fluent;     normal attention, concentration,     fund of knowledge Cranial Nerves:    The pupils are equal, round, and reactive to light. The fundi are normal and spontaneous venous pulsations are present. Visual fields are full to finger confrontation. Extraocular movements are intact. Trigeminal sensation is intact and the muscles of mastication are normal. The face is symmetric. The palate elevates in the midline. Hearing intact. Voice is normal. Shoulder shrug is normal. The tongue has normal motion without fasciculations.     Assessment/Plan:  69 year old female with chronic migraines.  Will try Verapamil. Will give patient handout from up to date. Will discuss acute management when migraines improved. Last EKG sinus rhythm     Sarina Ill, MD  [facility] [address] [place], [place] [address]  Phone [phone] Fax [phone]  A total of 30 minutes was spent face-to-face with this patient. Over half this time was spent on counseling patient on the chronic migraine diagnosis and different diagnostic and therapeutic options available.

## 2015-01-22 ENCOUNTER — Encounter: Payer: Self-pay | Admitting: Family Medicine

## 2015-01-23 ENCOUNTER — Encounter: Payer: 59 | Admitting: Gastroenterology

## 2015-01-23 ENCOUNTER — Telehealth: Payer: Self-pay | Admitting: Neurology

## 2015-01-23 NOTE — Telephone Encounter (Signed)
Spoke to patient, the migraine cocktail helped but the headache came back.    [name] Nolan - can you please schedule patient for sphenocath on Thursday at 11:30 please. We can create an appointment if needed. Thank you.

## 2015-01-24 NOTE — Telephone Encounter (Signed)
Dr. Jaynee Eagles, Made appointment tomorrow for sphenocath at 11:30 am. Thank you!

## 2015-01-25 ENCOUNTER — Telehealth: Payer: Self-pay | Admitting: *Deleted

## 2015-01-25 ENCOUNTER — Encounter: Payer: Self-pay | Admitting: Neurology

## 2015-01-25 ENCOUNTER — Ambulatory Visit (INDEPENDENT_AMBULATORY_CARE_PROVIDER_SITE_OTHER): Payer: Medicare Other | Admitting: Neurology

## 2015-01-25 ENCOUNTER — Encounter: Payer: Self-pay | Admitting: *Deleted

## 2015-01-25 ENCOUNTER — Telehealth: Payer: Self-pay | Admitting: Neurology

## 2015-01-25 ENCOUNTER — Ambulatory Visit: Admitting: Neurology

## 2015-01-25 ENCOUNTER — Ambulatory Visit (INDEPENDENT_AMBULATORY_CARE_PROVIDER_SITE_OTHER): Payer: Medicare Other | Admitting: *Deleted

## 2015-01-25 VITALS — BP 118/78 | HR 66 | Temp 98.1°F | Wt 182.2 lb

## 2015-01-25 DIAGNOSIS — I499 Cardiac arrhythmia, unspecified: Secondary | ICD-10-CM

## 2015-01-25 DIAGNOSIS — I493 Ventricular premature depolarization: Secondary | ICD-10-CM

## 2015-01-25 DIAGNOSIS — Z6832 Body mass index (BMI) 32.0-32.9, adult: Secondary | ICD-10-CM | POA: Insufficient documentation

## 2015-01-25 DIAGNOSIS — G43811 Other migraine, intractable, with status migrainosus: Secondary | ICD-10-CM

## 2015-01-25 DIAGNOSIS — G43011 Migraine without aura, intractable, with status migrainosus: Secondary | ICD-10-CM | POA: Diagnosis not present

## 2015-01-25 DIAGNOSIS — E669 Obesity, unspecified: Secondary | ICD-10-CM | POA: Diagnosis not present

## 2015-01-25 DIAGNOSIS — E6609 Other obesity due to excess calories: Secondary | ICD-10-CM | POA: Insufficient documentation

## 2015-01-25 DIAGNOSIS — R519 Headache, unspecified: Secondary | ICD-10-CM

## 2015-01-25 DIAGNOSIS — G4719 Other hypersomnia: Secondary | ICD-10-CM

## 2015-01-25 DIAGNOSIS — R51 Headache: Secondary | ICD-10-CM | POA: Diagnosis not present

## 2015-01-25 DIAGNOSIS — G43101 Migraine with aura, not intractable, with status migrainosus: Secondary | ICD-10-CM

## 2015-01-25 DIAGNOSIS — G44209 Tension-type headache, unspecified, not intractable: Secondary | ICD-10-CM

## 2015-01-25 HISTORY — DX: Headache, unspecified: R51.9

## 2015-01-25 HISTORY — DX: Cardiac arrhythmia, unspecified: I49.9

## 2015-01-25 HISTORY — DX: Other hypersomnia: G47.19

## 2015-01-25 MED ORDER — SUVOREXANT 15 MG PO TABS: 15.0000 mg | ORAL_TABLET | Freq: Every day | ORAL | Status: DC

## 2015-01-25 MED ORDER — DIVALPROEX SODIUM ER 500 MG PO TB24: 500.0000 mg | ORAL_TABLET | Freq: Every day | ORAL | Status: DC

## 2015-01-25 NOTE — Progress Notes (Signed)
[facility]    Provider:  Dr Jaynee Eagles Referring Provider: Mosie Lukes, MD Primary Care Physician:  Penni Homans, MD  CC: Migraine  HPI: [name] Nolan is a 69 y.o. female here as a referral from Dr. Coralyn Mark for chronic migraine  Interval update 01/25/2015: Patient reports the Verapamil is not helping. She is having significant sleep troubles. Since last seen she had a migraine cocktail. She has had headaches every single day since she was last here. She took a Recruitment consultant today. Excessive daytime drowsiness, Doesn't think she snores but not sure. Discussed migraine management, meds available, sleep study for possible OSA causing headaches or hypoventilation. Discussed at length. Will refer for botox if this regimen is not helpful.   Her fatigue severity scale today is 61. Sleepiness scale is 2. BMI 30.  01/10/2015: Migraines started years ago. Worsening after menopause. Worsening after 2007. Worse with stress. She is a former patient of Dr. Leta Baptist. She has headaches every other day. They started in the occipital area with pain and travel. Pulsating, moves to behind the eyes. +light sensitivity. Wants to go into a dark room. +nause. No vomiting. They last up to all day long maybe longer. Trigger are spices, peanut butter, banana, chocolate. Had side effects to amitriptyline. Blurry vision with the headaches. No signs of sleep apnea. Failed relpax, failed topamax, failed propranolol, Failed maxalt, failt zomig. No aura.Tried amitriptyline, topiramate, propranolol. triptans don't work.   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 Reviewed notes,   Review of Systems: Patient complains of symptoms per HPI as well as the following symptoms: Headache, no chest pain, no shortness of breath. Pertinent negatives per HPI. All others negative.   History   Social History  . Marital Status: Married    Spouse Name: [name]  . Number of Children: 1    . Years of Education: HS   Occupational History  . Retired    Social History Main Topics  . Smoking status: Never Smoker   . Smokeless tobacco: Never Used  . Alcohol Use: No  . Drug Use: No  . Sexual Activity: Yes    Birth Control/ Protection: Post-menopausal   Other Topics Concern  . Not on file   Social History Narrative   Patient is married [name]) and lives at home with her husband.   Patient has one child.   Patient has a high school education.   Patient is right-handed.   Caffeine Use: Occasionally    Family History  Problem Relation Age of Onset  . Congestive Heart Failure Mother   . Leukemia Father   . Kidney disease Brother   . Melanoma Sister   . Lung cancer Brother     Past Medical History  Diagnosis Date  . Hypothyroid   . Migraine   . Anxiety   . Occipital neuralgia   . Insomnia   . Esophageal reflux   . Prolonged depressive reaction   . Hyperlipidemia   . Menopause     Past Surgical History  Procedure Laterality Date  . Cholecystectomy    . Tubal ligation    . Tooth implant      at least 5 years ago per pt    Current Outpatient Prescriptions  Medication Sig Dispense Refill  . ALPRAZolam (XANAX) 1 MG tablet Take 1/2 or one whole tablet hs prn sleep 30 tablet 1  . amitriptyline (ELAVIL) 50 MG tablet Take 1 tablet (50 mg total) by mouth at bedtime. [place]  tablet 12  . calcium gluconate 500 MG tablet Take 1 tablet by mouth 2 (two) times daily.    Marland Kitchen co-enzyme Q-10 50 MG capsule Take 100 mg by mouth daily.    Marland Kitchen escitalopram (LEXAPRO) 10 MG tablet Take 1 tablet (10 mg total) by mouth at bedtime. 90 tablet 1  . levothyroxine (SYNTHROID, LEVOTHROID) 75 MCG tablet Take 75 mcg by mouth daily before breakfast.    . magnesium gluconate (MAGONATE) 500 MG tablet Take 500 mg by mouth daily.     . rizatriptan (MAXALT-MLT) 10 MG disintegrating tablet Take 1 tablet (10 mg total) by mouth as needed for migraine. May repeat in 2 hours if needed 15 tablet 11  .  verapamil (VERELAN PM) 120 MG 24 hr capsule Take 1 capsule (120 mg total) by mouth at bedtime. 30 capsule 6   No current facility-administered medications for this visit.    Allergies as of 01/25/2015 - Review Complete 01/10/2015  Allergen Reaction Noted  . Codeine  07/12/2013  . Statins  05/07/2014  . Sulfa antibiotics  07/12/2013    Vitals: There were no vitals taken for this visit. Last Weight:  Wt Readings from Last 1 Encounters:  01/10/15 179 lb 8 oz (81.421 kg)   Last Height:   Ht Readings from Last 1 Encounters:  01/10/15 5\' 5"  (1.651 m)    Physical exam: Exam: Gen: NAD, conversant, well nourised, obese, well groomed                     CV: irregular, no MRG. No Carotid Bruits. No peripheral edema, warm, nontender Eyes: Conjunctivae clear without exudates or hemorrhage  Neuro: Detailed Neurologic Exam  Speech:    Speech is normal; fluent and spontaneous with normal comprehension.  Cognition:    The patient is oriented to person, place, and time;     recent and remote memory intact;     language fluent;     normal attention, concentration,     fund of knowledge Cranial Nerves:    The pupils are equal, round, and reactive to light. The fundi are normal and spontaneous venous pulsations are present. Visual fields are full to finger confrontation. Extraocular movements are intact. Trigeminal sensation is intact and the muscles of mastication are normal. The face is symmetric. The palate elevates in the midline. Hearing intact. Voice is normal. Shoulder shrug is normal. The tongue has normal motion without fasciculations.     Assessment/Plan:  69 year old patient to seen here today with chronic migraines with status migrainosus, intractable.  Will stop Verapamil Will start Depakote 500mg  DR then increase to 1000mg  for migraine Patient with irregular heart rate, asymptomatic. Will order routine EKG.   Sleep Study: would like eval for OSA and obesity  hypoventilation. Will ask the sleep team to screen. Her fatigue severity scale today is 61. Sleepiness scale is 2. BMI 30. Morning, refractory headaches.    Sarina Ill, MD  [facility] [address] [place], [place] [address]  Phone [phone] Fax [phone]  A total of 45 minutes was spent in with this patient. Over half this time was spent face-to-face on counseling patient on the migraine and excessive sleepiness diagnosis and different therapeutic options available.

## 2015-01-25 NOTE — Patient Instructions (Signed)
Overall you are doing fairly well but I do want to suggest a few things today:   Remember to drink plenty of fluid, eat healthy meals and do not skip any meals. Try to eat protein with a every meal and eat a healthy snack such as fruit or nuts in between meals. Try to keep a regular sleep-wake schedule and try to exercise daily, particularly in the form of walking, 20-30 minutes a day, if you can.   As far as your medications are concerned, I would like to suggest Stop verapamil Depakote 500mg  at night. Can increase to 1000mg  at night if needed Belsomra 10mg  or 15mg  or 20mg  one hour before bed. Decrease Ativan slowly  As far as diagnostic testing: sleep study  I would like to see you back in 6 weeks, sooner if we need to. Please call us with any interim questions, concerns, problems, updates or refill requests.   Please also call us for any test results so we can go over those with you on the phone.  My clinical assistant and will answer any of your questions and relay your messages to me and also relay most of my messages to you.   Our phone number is [phone]. We also have an after hours call service for urgent matters and there is a physician on-call for urgent questions. For any emergencies you know to call 911 or go to the nearest emergency room

## 2015-01-25 NOTE — Telephone Encounter (Signed)
Nevin Bloodgood from [facility] is calling regarding [name] Nolan MRN [id number] regarding an EKG do you have a moment? Nevin Bloodgood can be reached @  # [phone] Please call her when you get a chance. Patient is there wanting to have her EKG but patient has not been seen in over 3+ years and would be a new patient.

## 2015-01-25 NOTE — Telephone Encounter (Signed)
Patient sent to our office from [facility] in regards to abnormal pulse.  EKG shows NSR w/ occasional PVCs.  This is unchanged from September visit with Dr. Stanford Breed. Dr. Lovena Le, DOD, reviewed.  No orders received.

## 2015-01-25 NOTE — Progress Notes (Signed)
Author Note Status Last Update User Last Update Date/Time   Stanton Kidney, RN Signed Stanton Kidney, RN 01/25/2015 1:56 PM    Telephone Encounter    Expand All Collapse All   Patient sent to our office from [facility] in regards to abnormal pulse.  EKG shows NSR w/ occasional PVCs. This is unchanged from September visit with Dr. Stanford Breed. Dr. Lovena Le, DOD, reviewed. No orders received.     Nurse Visit Navigator so that EKG Charge Capture could be completed. Immunologist, Therapist, sports)

## 2015-01-26 ENCOUNTER — Telehealth: Payer: Self-pay | Admitting: Neurology

## 2015-01-26 NOTE — Telephone Encounter (Signed)
Patient called stating that the procedure she had yesterday for her headache yesterday did not work and her script for divalproex (DEPAKOTE ER) 500 MG 24 hr tablet is making her sick. Please call and advise. Patient can be reached @ [phone]

## 2015-01-26 NOTE — Telephone Encounter (Signed)
Called patient back, she is feeling better. She only has a "little" headache now thankfully. Advised rest.

## 2015-01-26 NOTE — Telephone Encounter (Signed)
She has a severe headache. Dull. At the top of her head. She said she took the Depakote last night for pain and then tried another one. This is not unusual to her, this is one of her bad headaches, no new qualities. She feels nauseated. She is staying in the dark. Advised to go the emergency room. She declined. She has these at least every week this bad, same qualities. Took another maxalt and it is a dull headache about a 7/10. Advised her not to take more than 2 maxalts daily.   Reviewed Depakote, one pill in the evenings, this is not a medication to take as needed for pain.

## 2015-01-28 ENCOUNTER — Encounter: Payer: Self-pay | Admitting: Neurology

## 2015-01-28 NOTE — Progress Notes (Signed)
    SPHENOCATH PROCEDURE NOTE  CC: Migraine for 2 weeks, intractable  HPI: [name] Nolan is a 69 y.o. female here as a referral from Dr. Coralyn Mark for chronic migraine  Interval update 01/25/2015: Patient reports the Verapamil is not helping. She is having significant sleep troubles. Since last seen she had a migraine cocktail. She has had headaches every single day since she was last here. She took a Recruitment consultant today. Excessive daytime drowsiness, Doesn't think she snores but not sure. Discussed migraine management, meds available, sleep study for possible OSA causing headaches or hypoventilation. Discussed at length. Will refer for botox if this regimen is not helpful.   Her fatigue severity scale today is 61. Sleepiness scale is 2. BMI 30.  01/10/2015: Migraines started years ago. Worsening after menopause. Worsening after 2007. Worse with stress. She is a former patient of Dr. Leta Baptist. She has headaches every other day. They started in the occipital area with pain and travel. Pulsating, moves to behind the eyes. +light sensitivity. Wants to go into a dark room. +nause. No vomiting. They last up to all day long maybe longer. Trigger are spices, peanut butter, banana, chocolate. Had side effects to amitriptyline. Blurry vision with the headaches. No signs of sleep apnea. Failed relpax, failed topamax, failed propranolol, Failed maxalt, failt zomig. No aura.Tried amitriptyline, topiramate, propranolol. triptans don't work.   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 Reviewed notes,   Procedure: The patient was placed in the supine position. A temperature strip was added to the cheek area after the area was cleaned with alcohol. The Sphenocath was lubricated with gel, and placed in the right naris. The catheter was inserted above the middle turbinate to the posterior nasal cavity, and then withdrawn 1 cm. The catheter was deployed and rotated approximately 20 towards  the nose. 2-1/2 mL of 2% lidocaine was deployed. The patient was asked to swallow during the injection. The patient demonstrated erythema of the sclera of the eye on this side, and an increase in the cheek temperature was noted from 94 F to 98 F.  This process was repeated on the left side, with similar results. The increase in cheek temperature was documented from 74 F to 71 F.  The patient tolerated the procedure well. No complications of the procedure were noted. The patient was kept in the supine position for 8 minutes following the procedure. She was given small sips of water after sitting up following the procedure.  Lidocaine 4% NDC 3532-9924-26  Expiration date: 11/2015 Lot number: [id number]  Melvenia Beam

## 2015-01-29 ENCOUNTER — Encounter: Payer: Self-pay | Admitting: Neurology

## 2015-01-29 NOTE — Telephone Encounter (Signed)
Spoke to patient, she is feeling better. She was very happy to get a call from the sleep team and Dr Rexene Alberts and very much appreciates that and looks forward to an appointment with Dr. Rexene Alberts and a sleep test because she feels her sleeping issues may have a lot to do with her refractory migraines and headaches. Will continue the Depakote qhs.

## 2015-02-08 ENCOUNTER — Ambulatory Visit (INDEPENDENT_AMBULATORY_CARE_PROVIDER_SITE_OTHER): Payer: Medicare Other | Admitting: Neurology

## 2015-02-08 ENCOUNTER — Encounter: Payer: Self-pay | Admitting: Neurology

## 2015-02-08 VITALS — BP 122/74 | HR 68 | Resp 16 | Ht 65.0 in | Wt 178.0 lb

## 2015-02-08 DIAGNOSIS — G478 Other sleep disorders: Secondary | ICD-10-CM

## 2015-02-08 DIAGNOSIS — R635 Abnormal weight gain: Secondary | ICD-10-CM

## 2015-02-08 DIAGNOSIS — G2581 Restless legs syndrome: Secondary | ICD-10-CM | POA: Diagnosis not present

## 2015-02-08 DIAGNOSIS — R51 Headache: Secondary | ICD-10-CM | POA: Diagnosis not present

## 2015-02-08 DIAGNOSIS — G4761 Periodic limb movement disorder: Secondary | ICD-10-CM | POA: Diagnosis not present

## 2015-02-08 DIAGNOSIS — R0683 Snoring: Secondary | ICD-10-CM

## 2015-02-08 DIAGNOSIS — R519 Headache, unspecified: Secondary | ICD-10-CM

## 2015-02-08 NOTE — Patient Instructions (Signed)
Based on your symptoms and your exam I believe you may be at risk for obstructive sleep apnea or OSA, and I think we should proceed with a sleep study to determine whether you do or do not have OSA and how severe it is. If you have more than mild OSA, I want you to consider treatment with CPAP. Please remember, the risks and ramifications of moderate to severe obstructive sleep apnea or OSA are: Cardiovascular disease, including congestive heart failure, stroke, difficult to control hypertension, arrhythmias, and even type 2 diabetes has been linked to untreated OSA. Sleep apnea causes disruption of sleep and sleep deprivation in most cases, which, in turn, can cause recurrent headaches, problems with memory, mood, concentration, focus, and vigilance. Most people with untreated sleep apnea report excessive daytime sleepiness, which can affect their ability to drive. Please do not drive if you feel sleepy.  I will see you back after your sleep study to go over the test results and where to go from there. We will call you after your sleep study and to set up an appointment at the time.   Our sleep lab administrative assistant, Angelina Sheriff will call you to schedule your sleep study. If you don't hear back from her by next week please feel free to call her at [phone]. This is her direct line and please leave a message with your phone number to call back if you get the voicemail box.

## 2015-02-08 NOTE — Progress Notes (Signed)
Subjective:    Patient ID: [name] Nolan is a 69 y.o. female.  HPI     [name] Age, MD, PhD [facility] [address] [place], [place] [address]  Dear [name] Nolan,   I saw your patient, [name] Nolan, upon your kind request in my clinic today for initial consultation of her sleep disorder, in particular, concern for underlying obstructive sleep apnea. The patient is accompanied by her husband, [name] Nolan, today. As you know, [name] Nolan is a 69 year old right-handed woman with an underlying medical history of hypothyroidism, migraine headaches, anxiety, reflux disease, hyperlipidemia, and obesity, who reports recurrent headaches, including morning headaches, nonrestorative sleep and daytime tiredness. She often wakes up with a headache in the middle of the night. In the recent past she has had almost a daily morning headache. She has a long-standing history of migraines. She was tried on amitriptyline recently but gained a lot of weight. She is trying to lose weight. She is off of amitriptyline. She was started on Depakote. Currently she takes 500 mg each night. She also takes as needed Maxalt. She snores. Her first husband used to tell her that she snores. She's not sure if she has breathing pauses while asleep. She endorses restless leg symptoms and twitching in her legs. Her current husband has obstructive sleep apnea and she is familiar with the diagnosis and with CPAP therapy. She watches TV at night. She has taken something for sleep for several years. She has been on Xanax 1 mg each night but currently is not taking it because she recently started Belsomra 15 mg, which she believes has been working fairly well. Bedtime is usually around 10 PM and she tries to take her sleeping pill 30-60 minutes before. She may be asleep within an hour. Her rise time is around 9:51 AM and she feels a little groggy. It takes her about an hour to get started. She  does not drink caffeine and came off of it a few years ago secondary to recurrent headaches. She does not drink alcohol. She does not smoke. She is not aware of any family history of sleep disorders including obstructive sleep apnea. She has a lot of stress. Her Epworth sleepiness score is 1 out of 24 today. Her fatigue score is 44 out of 63 today.   Her Past Medical History Is Significant For: Past Medical History  Diagnosis Date  . Hypothyroid   . Migraine   . Anxiety   . Occipital neuralgia   . Insomnia   . Esophageal reflux   . Prolonged depressive reaction   . Hyperlipidemia   . Menopause   . Arrhythmia 01/25/2015    Per Dr. Jaynee Nolan, [facility]    Her Past Surgical History Is Significant For: Past Surgical History  Procedure Laterality Date  . Cholecystectomy    . Tubal ligation    . Tooth implant      at least 5 years ago per pt    Her Family History Is Significant For: Family History  Problem Relation Nolan of Onset  . Congestive Heart Failure Mother   . Leukemia Father   . Kidney disease Brother   . Melanoma Sister   . Lung cancer Brother     Her Social History Is Significant For: History   Social History  . Marital Status: Married    Spouse Name: [name] Nolan  . Number of Children: 1  . Years of Education: HS   Occupational History  .  Retired    Social History Main Topics  . Smoking status: Never Smoker   . Smokeless tobacco: Never Used  . Alcohol Use: No  . Drug Use: No  . Sexual Activity: Yes    Birth Control/ Protection: Post-menopausal   Other Topics Concern  . None   Social History Narrative   Patient is married [name] Nolan) and lives at home with her husband.   Patient has one child.   Patient has a high school education.   Patient is right-handed.   Caffeine Use: Occasionally    Her Allergies Are:  Allergies  Allergen Reactions  . Codeine   . Statins     intolerance  . Sulfa Antibiotics   :   Her Current Medications Are:   Outpatient Encounter Prescriptions as of 02/08/2015  Medication Sig  . ALPRAZolam (XANAX) 1 MG tablet Take 1/2 or one whole tablet hs prn sleep  . calcium gluconate 500 MG tablet Take 1 tablet by mouth 2 (two) times daily.  Marland Kitchen co-enzyme Q-10 50 MG capsule Take 100 mg by mouth daily.  . divalproex (DEPAKOTE ER) 500 MG 24 hr tablet Take 1 tablet (500 mg total) by mouth daily.  Marland Kitchen escitalopram (LEXAPRO) 10 MG tablet Take 1 tablet (10 mg total) by mouth at bedtime.  Marland Kitchen levothyroxine (SYNTHROID, LEVOTHROID) 75 MCG tablet Take 75 mcg by mouth daily before breakfast.  . magnesium gluconate (MAGONATE) 500 MG tablet Take 500 mg by mouth daily.   . rizatriptan (MAXALT-MLT) 10 MG disintegrating tablet Take 1 tablet (10 mg total) by mouth as needed for migraine. May repeat in 2 hours if needed  . Suvorexant (BELSOMRA) 15 MG TABS Take 15 mg by mouth at bedtime.   No facility-administered encounter medications on file as of 02/08/2015.  :  Review of Systems:  Out of a complete 14 point review of systems, all are reviewed and negative with the exception of these symptoms as listed below:   Review of Systems  Constitutional: Positive for fatigue.       Weight gain   HENT: Positive for hearing loss, rhinorrhea, tinnitus and trouble swallowing.   Respiratory: Positive for shortness of breath.   Endocrine:       Feeling hot, flushing   Musculoskeletal:       Joint pain   Allergic/Immunologic: Positive for environmental allergies.  Neurological: Positive for syncope and headaches.       Trouble falling asleep without taking medication, trouble staying asleep, restless legs, migraines that start sometimes in the night, denies taking naps during the day, most days feels that she is tired throughout the day.    Psychiatric/Behavioral:       Not enough sleep, decreased energy, racing thoughts     Objective:  Neurologic Exam  Physical Exam Physical Examination:   Filed Vitals:   02/08/15 1432  BP:  122/74  Pulse: 68  Resp: 16    General Examination: The patient is a very pleasant 68 y.o. female in no acute distress. She appears well-developed and well-nourished and well groomed.   HEENT: Normocephalic, atraumatic, pupils are equal, round and reactive to light and accommodation. Funduscopic exam is normal with sharp disc margins noted. Extraocular tracking is good without limitation to gaze excursion or nystagmus noted. Normal smooth pursuit is noted. Hearing is grossly intact. Tympanic membranes are clear bilaterally. Face is symmetric with normal facial animation and normal facial sensation. Speech is clear with no dysarthria noted. There is no hypophonia. There is no lip, neck/head,  jaw or voice tremor. Neck is supple with full range of passive and active motion. There are no carotid bruits on auscultation. Oropharynx exam reveals: mild mouth dryness, good dental hygiene and mild airway crowding, due to narrow airway entry. Mallampati is class II. Tongue protrudes centrally and palate elevates symmetrically. Tonsils are small or absent. Neck size is 14 1/8 inches. She has a Mild overbite. Nasal inspection reveals no significant nasal mucosal bogginess or redness and no septal deviation.   Chest: Clear to auscultation without wheezing, rhonchi or crackles noted.  Heart: S1+S2+0, regular and normal without murmurs, rubs or gallops noted.   Abdomen: Soft, non-tender and non-distended with normal bowel sounds appreciated on auscultation.  Extremities: There is no pitting edema in the distal lower extremities bilaterally. Pedal pulses are intact.  Skin: Warm and dry without trophic changes noted. There are no varicose veins.  Musculoskeletal: exam reveals no obvious joint deformities, tenderness or joint swelling or erythema.   Neurologically:  Mental status: The patient is awake, alert and oriented in all 4 spheres. Her immediate and remote memory, attention, language skills and fund of  knowledge are appropriate. There is no evidence of aphasia, agnosia, apraxia or anomia. Speech is clear with normal prosody and enunciation. Thought process is linear. Mood is normal and affect is normal.  Cranial nerves II - XII are as described above under HEENT exam. In addition: shoulder shrug is normal with equal shoulder height noted. Motor exam: Normal bulk, strength and tone is noted. There is no drift, tremor or rebound. Romberg is negative. Reflexes are 2+ throughout. Fine motor skills and coordination: intact with normal finger taps, normal hand movements, normal rapid alternating patting, normal foot taps and normal foot agility.  Cerebellar testing: No dysmetria or intention tremor on finger to nose testing. Heel to shin is unremarkable bilaterally. There is no truncal or gait ataxia.  Sensory exam: intact to light touch, pinprick, vibration, temperature sense in the upper and lower extremities.  Gait, station and balance: She stands easily. No veering to one side is noted. No leaning to one side is noted. Posture is Nolan-appropriate and stance is narrow based. Gait shows normal stride length and normal pace. No problems turning are noted. She turns en bloc. Tandem walk is unremarkable.   Assessment and Plan:   In summary, [name] Nolan is a very pleasant 69 y.o.-year old female with a history and physical exam concerning for obstructive sleep apnea (OSA). I had a long chat with the patient and her husband about my findings and the diagnosis of OSA, its prognosis and treatment options. We talked about medical treatments, surgical interventions and non-pharmacological approaches. I explained in particular the risks and ramifications of untreated moderate to severe OSA, especially with respect to developing cardiovascular disease down the Road, including congestive heart failure, difficult to treat hypertension, cardiac arrhythmias, or stroke. Even type 2 diabetes has, in part, been  linked to untreated OSA. Symptoms of untreated OSA include daytime sleepiness, memory problems, mood irritability and mood disorder such as depression and anxiety, lack of energy, as well as recurrent headaches, especially morning headaches. We talked about trying to maintain a healthy lifestyle in general, as well as the importance of weight control. I encouraged the patient to eat healthy, exercise daily and keep well hydrated, to keep a scheduled bedtime and wake time routine, to not skip any meals and eat healthy snacks in between meals. I advised the patient not to drive when feeling sleepy. I recommended  the following at this time: sleep study with potential positive airway pressure titration. (We will score hypopneas at 4% and split the sleep study into diagnostic and treatment portion, if the estimated. 2 hour AHI is >20/h).   I explained the sleep test procedure to the patient and also outlined possible surgical and non-surgical treatment options of OSA, including the use of a custom-made dental device (which would require a referral to a specialist dentist or oral surgeon), upper airway surgical options, such as pillar implants, radiofrequency surgery, tongue base surgery, and UPPP (which would involve a referral to an ENT surgeon). Rarely, jaw surgery such as mandibular advancement may be considered.  I also explained the CPAP treatment option to the patient, who indicated that she would be willing to try CPAP if the need arises. I explained the importance of being compliant with PAP treatment, not only for insurance purposes but primarily to improve Her symptoms, and for the patient's long term health benefit, including to reduce Her cardiovascular risks. I answered all their questions today and the patient and her husband were in agreement. I would like to see her back after the sleep study is completed and encouraged her to call with any interim questions, concerns, problems or updates.   Thank  you very much for allowing me to participate in the care of this nice patient. If I can be of any further assistance to you please do not hesitate to talk to me.   Sincerely,   [name] Age, MD, PhD

## 2015-02-20 ENCOUNTER — Telehealth: Payer: Self-pay | Admitting: Neurology

## 2015-02-20 NOTE — Telephone Encounter (Signed)
Patient states that she was out of town and was told to call back when she got back. Margeret wants to know if we are ready to set up her sleep study.

## 2015-02-20 NOTE — Telephone Encounter (Signed)
Patient called and requested to speak with the nurse regarding a sleep study that she was supposed to set up. Please call and advise.

## 2015-02-26 ENCOUNTER — Ambulatory Visit (INDEPENDENT_AMBULATORY_CARE_PROVIDER_SITE_OTHER): Payer: Medicare Other | Admitting: Family Medicine

## 2015-02-26 ENCOUNTER — Encounter: Payer: Self-pay | Admitting: Family Medicine

## 2015-02-26 ENCOUNTER — Ambulatory Visit (HOSPITAL_BASED_OUTPATIENT_CLINIC_OR_DEPARTMENT_OTHER)
Admission: RE | Admit: 2015-02-26 | Discharge: 2015-02-26 | Disposition: A | Discharge status: Home / Self Care | Payer: Medicare Other | Source: Ambulatory Visit | Attending: Family Medicine | Admitting: Family Medicine

## 2015-02-26 VITALS — BP 120/84 | HR 52 | Temp 98.0°F | Ht 65.0 in | Wt 179.5 lb

## 2015-02-26 DIAGNOSIS — E041 Nontoxic single thyroid nodule: Secondary | ICD-10-CM | POA: Diagnosis not present

## 2015-02-26 DIAGNOSIS — E038 Other specified hypothyroidism: Secondary | ICD-10-CM | POA: Diagnosis not present

## 2015-02-26 DIAGNOSIS — F411 Generalized anxiety disorder: Secondary | ICD-10-CM

## 2015-02-26 DIAGNOSIS — E785 Hyperlipidemia, unspecified: Secondary | ICD-10-CM

## 2015-02-26 DIAGNOSIS — G43009 Migraine without aura, not intractable, without status migrainosus: Secondary | ICD-10-CM

## 2015-02-26 DIAGNOSIS — M8580 Other specified disorders of bone density and structure, unspecified site: Secondary | ICD-10-CM

## 2015-02-26 DIAGNOSIS — C4490 Unspecified malignant neoplasm of skin, unspecified: Secondary | ICD-10-CM | POA: Diagnosis not present

## 2015-02-26 DIAGNOSIS — R001 Bradycardia, unspecified: Secondary | ICD-10-CM

## 2015-02-26 DIAGNOSIS — R1310 Dysphagia, unspecified: Secondary | ICD-10-CM | POA: Insufficient documentation

## 2015-02-26 DIAGNOSIS — E669 Obesity, unspecified: Secondary | ICD-10-CM

## 2015-02-26 DIAGNOSIS — C44212 Basal cell carcinoma of skin of right ear and external auricular canal: Secondary | ICD-10-CM

## 2015-02-26 DIAGNOSIS — E039 Hypothyroidism, unspecified: Secondary | ICD-10-CM | POA: Diagnosis present

## 2015-02-26 HISTORY — PX: BASAL CELL CARCINOMA EXCISION: SHX1214

## 2015-02-26 HISTORY — DX: Other specified disorders of bone density and structure, unspecified site: M85.80

## 2015-02-26 HISTORY — DX: Basal cell carcinoma of skin of right ear and external auricular canal: C44.212

## 2015-02-26 HISTORY — DX: Unspecified malignant neoplasm of skin, unspecified: C44.90

## 2015-02-26 LAB — LIPID PANEL
CHOL/HDL RATIO: 4
Cholesterol: 197 mg/dL (ref 0–200)
HDL: 52.9 mg/dL (ref >39.00)
LDL CALC: 127 mg/dL — AB (ref 0–99)
NONHDL: 144.1
Triglycerides: 88 mg/dL (ref 0.0–149.0)
VLDL: 17.6 mg/dL (ref 0.0–40.0)

## 2015-02-26 LAB — CBC
HCT: 37.9 % (ref 36.0–46.0)
Hemoglobin: 12.4 g/dL (ref 12.0–15.0)
MCHC: 32.7 g/dL (ref 30.0–36.0)
MCV: 83.8 fl (ref 78.0–100.0)
PLATELETS: 274 10*3/uL (ref 150.0–400.0)
RBC: 4.53 Mil/uL (ref 3.87–5.11)
RDW: 16.8 % — ABNORMAL HIGH (ref 11.5–15.5)
WBC: 5.5 10*3/uL (ref 4.0–10.5)

## 2015-02-26 LAB — COMPREHENSIVE METABOLIC PANEL
ALK PHOS: 70 U/L (ref 39–117)
ALT: 15 U/L (ref 0–35)
AST: 26 U/L (ref 0–37)
Albumin: 4.3 g/dL (ref 3.5–5.2)
BUN: 19 mg/dL (ref 6–23)
CALCIUM: 9.9 mg/dL (ref 8.4–10.5)
CO2: 26 mEq/L (ref 19–32)
Chloride: 104 mEq/L (ref 96–112)
Creatinine, Ser: 0.99 mg/dL (ref 0.40–1.20)
GFR: 59.39 mL/min — AB (ref >60.00)
GLUCOSE: 71 mg/dL (ref 70–99)
POTASSIUM: 3.8 meq/L (ref 3.5–5.1)
SODIUM: 137 meq/L (ref 135–145)
Total Bilirubin: 0.5 mg/dL (ref 0.2–1.2)
Total Protein: 7.1 g/dL (ref 6.0–8.3)

## 2015-02-26 LAB — TSH: TSH: 2.58 u[IU]/mL (ref 0.35–4.50)

## 2015-02-26 LAB — VITAMIN D 25 HYDROXY (VIT D DEFICIENCY, FRACTURES): VITD: 31.95 ng/mL (ref 30.00–100.00)

## 2015-02-26 NOTE — Assessment & Plan Note (Signed)
Encouraged DASH diet, decrease po intake and increase exercise as tolerated. Needs 7-8 hours of sleep nightly. Avoid trans fats, eat small, frequent meals every 4-5 hours with lean proteins, complex carbs and healthy fats. Minimize simple carbs, GMO foods. 

## 2015-02-26 NOTE — Assessment & Plan Note (Signed)
>>  ASSESSMENT AND PLAN FOR OBESITY WRITTEN ON 02/26/2015 12:03 PM BY BLYTH, STACEY A, MD  Encouraged DASH diet, decrease po intake and increase exercise as tolerated. Needs 7-8 hours of sleep nightly. Avoid trans fats, eat small, frequent meals every 4-5 hours with lean proteins, complex carbs and healthy fats. Minimize simple carbs, GMO foods.

## 2015-02-26 NOTE — Assessment & Plan Note (Signed)
Patient had been referred to GI by previous PMD but did not proceed has not had any further choking episodes recently. Encouraged to accept the referreal declines for now

## 2015-02-26 NOTE — Progress Notes (Signed)
Pre visit review using our clinic review tool, if applicable. No additional management support is needed unless otherwise documented below in the visit note. 

## 2015-02-26 NOTE — Patient Instructions (Addendum)
Call your insurance and clarify will they pay for the second pneumonia shot? Called Pneumovax (23)  You had Prevnar (13 valent) shot on September 04, 2014    DASH Eating Plan DASH stands for "Dietary Approaches to Stop Hypertension." The DASH eating plan is a healthy eating plan that has been shown to reduce high blood pressure (hypertension). Additional health benefits may include reducing the risk of type 2 diabetes mellitus, heart disease, and stroke. The DASH eating plan may also help with weight loss. WHAT DO I NEED TO KNOW ABOUT THE DASH EATING PLAN? For the DASH eating plan, you will follow these general guidelines:  Choose foods with a percent daily value for sodium of less than 5% (as listed on the food label).  Use salt-free seasonings or herbs instead of table salt or sea salt.  Check with your health care provider or pharmacist before using salt substitutes.  Eat lower-sodium products, often labeled as "lower sodium" or "no salt added."  Eat fresh foods.  Eat more vegetables, fruits, and low-fat dairy products.  Choose whole grains. Look for the word "whole" as the first word in the ingredient list.  Choose fish and skinless chicken or [place] more often than red meat. Limit fish, poultry, and meat to 6 oz (170 g) each day.  Limit sweets, desserts, sugars, and sugary drinks.  Choose heart-healthy fats.  Limit cheese to 1 oz (28 g) per day.  Eat more home-cooked food and less restaurant, buffet, and fast food.  Limit fried foods.  Cook foods using methods other than frying.  Limit canned vegetables. If you do use them, rinse them well to decrease the sodium.  When eating at a restaurant, ask that your food be prepared with less salt, or no salt if possible. WHAT FOODS CAN I EAT? Seek help from a dietitian for individual calorie needs. Grains Whole grain or whole wheat bread. Brown rice. Whole grain or whole wheat pasta. Quinoa, bulgur, and whole grain cereals.  Low-sodium cereals. Corn or whole wheat flour tortillas. Whole grain cornbread. Whole grain crackers. Low-sodium crackers. Vegetables Fresh or frozen vegetables (raw, steamed, roasted, or grilled). Low-sodium or reduced-sodium tomato and vegetable juices. Low-sodium or reduced-sodium tomato sauce and paste. Low-sodium or reduced-sodium canned vegetables.  Fruits All fresh, canned (in natural juice), or frozen fruits. Meat and Other Protein Products Ground beef (85% or leaner), grass-fed beef, or beef trimmed of fat. Skinless chicken or [place]. Ground chicken or [place]. Pork trimmed of fat. All fish and seafood. Eggs. Dried beans, peas, or lentils. Unsalted nuts and seeds. Unsalted canned beans. Dairy Low-fat dairy products, such as skim or 1% milk, 2% or reduced-fat cheeses, low-fat ricotta or cottage cheese, or plain low-fat yogurt. Low-sodium or reduced-sodium cheeses. Fats and Oils Tub margarines without trans fats. Light or reduced-fat mayonnaise and salad dressings (reduced sodium). Avocado. Safflower, olive, or canola oils. Natural peanut or almond butter. Other Unsalted popcorn and pretzels. The items listed above may not be a complete list of recommended foods or beverages. Contact your dietitian for more options. WHAT FOODS ARE NOT RECOMMENDED? Grains White bread. White pasta. White rice. Refined cornbread. Bagels and croissants. Crackers that contain trans fat. Vegetables Creamed or fried vegetables. Vegetables in a cheese sauce. Regular canned vegetables. Regular canned tomato sauce and paste. Regular tomato and vegetable juices. Fruits Dried fruits. Canned fruit in light or heavy syrup. Fruit juice. Meat and Other Protein Products Fatty cuts of meat. Ribs, chicken wings, bacon, sausage, bologna, salami, chitterlings,  fatback, hot dogs, bratwurst, and packaged luncheon meats. Salted nuts and seeds. Canned beans with salt. Dairy Whole or 2% milk, cream, half-and-half, and cream  cheese. Whole-fat or sweetened yogurt. Full-fat cheeses or blue cheese. Nondairy creamers and whipped toppings. Processed cheese, cheese spreads, or cheese curds. Condiments Onion and garlic salt, seasoned salt, table salt, and sea salt. Canned and packaged gravies. Worcestershire sauce. Tartar sauce. Barbecue sauce. Teriyaki sauce. Soy sauce, including reduced sodium. Steak sauce. Fish sauce. Oyster sauce. Cocktail sauce. Horseradish. Ketchup and mustard. Meat flavorings and tenderizers. Bouillon cubes. Hot sauce. Tabasco sauce. Marinades. Taco seasonings. Relishes. Fats and Oils Butter, stick margarine, lard, shortening, ghee, and bacon fat. Coconut, palm kernel, or palm oils. Regular salad dressings. Other Pickles and olives. Salted popcorn and pretzels. The items listed above may not be a complete list of foods and beverages to avoid. Contact your dietitian for more information. WHERE CAN I FIND MORE INFORMATION? National Heart, Lung, and Blood Institute: travelstabloid.com Document Released: 08/21/2011 Document Revised: 01/16/2014 Document Reviewed: 07/06/2013 [facility] Patient Information 2015 [place], [place]. This information is not intended to replace advice given to you by your health care provider. Make sure you discuss any questions you have with your health care provider.

## 2015-02-28 ENCOUNTER — Telehealth: Payer: Self-pay

## 2015-02-28 NOTE — Telephone Encounter (Signed)
Pt notified verbalized understanding, no questions at this time.

## 2015-02-28 NOTE — Telephone Encounter (Signed)
-----   Message from Mosie Lukes, MD sent at 02/27/2015 11:20 PM EDT ----- Notify thyroid size and texture are off some but stable from 2014, a couple of nodules noted but unchanged and small

## 2015-03-02 ENCOUNTER — Other Ambulatory Visit: Payer: Self-pay | Admitting: Family Medicine

## 2015-03-02 MED ORDER — ALPRAZOLAM 1 MG PO TABS: ORAL_TABLET | ORAL | Status: DC

## 2015-03-02 NOTE — Addendum Note (Signed)
Addended by: Sharon Seller B on: 03/02/2015 02:01 PM   Modules accepted: Orders

## 2015-03-02 NOTE — Telephone Encounter (Signed)
Caller name: Michalle Relation to pt: self Call back number: [phone] Pharmacy:[facility]  Reason for call:   Requesting alprazolam refill

## 2015-03-02 NOTE — Telephone Encounter (Signed)
Printed and on counter for signature. 

## 2015-03-02 NOTE — Telephone Encounter (Signed)
Faxed hardcopy for Alprazolam to [place]

## 2015-03-02 NOTE — Telephone Encounter (Signed)
Requesting:  ALPRAZOLAM Contract NO CONTRACT UDS   NO CONTRACT Last OV  02/26/15 Last Refill  01/03/15    #30 WITH 0 REFILLS  Please Advise

## 2015-03-04 NOTE — Progress Notes (Signed)
[name] Nolan  [id number] [birth year] 03/04/2015      Progress Note-Follow Up  Subjective  Chief Complaint  Chief Complaint  Patient presents with  . Establish Care    HPI  Patient is a 70 y.o. female in today for routine medical care. Patient is here today to establish care since her previous PMD left practice. She notes a complicated past medical history which includes hyperlipidemia, obesity, allergies, anxiety, cardiac arrhythmias, chronic migraines, hypothyroidism and hyper insomnia. She's also noted some recent dysphagia of both liquids and solids. No recent illness. No acute complaints. Denies CP/palp/SOB/HA/congestion/fevers/GI or GU c/o. Taking meds as prescribed  Past Medical History  Diagnosis Date  . Hypothyroid   . Migraine   . Anxiety   . Occipital neuralgia   . Insomnia   . Esophageal reflux   . Prolonged depressive reaction   . Hyperlipidemia   . Menopause   . Arrhythmia 01/25/2015    Per Dr. Jaynee Eagles, [facility]  . Arthritis   . Fainting     fainted twice  . Dysphagia   . Osteopenia 02/26/2015  . Skin cancer 02/26/2015    Right ear Removed by Dr [place]    Past Surgical History  Procedure Laterality Date  . Cholecystectomy    . Tubal ligation    . Tooth implant      at least 5 years ago per pt    Family History  Problem Relation Age of Onset  . Congestive Heart Failure Mother   . Leukemia Father   . Kidney disease Brother   . Cancer Brother     stage 4 kidney cancer  . Leukemia Maternal Aunt   . Congestive Heart Failure Maternal Grandmother     History   Social History  . Marital Status: Married    Spouse Name: [name]  . Number of Children: 1  . Years of Education: HS   Occupational History  . Retired    Social History Main Topics  . Smoking status: Never Smoker   . Smokeless tobacco: Never Used  . Alcohol Use: No  . Drug Use: No  . Sexual Activity: Yes    Birth Control/ Protection: Post-menopausal     Comment:  lives with husband, no dietary restrictions, avoids caffeine, bananas, dairy   Other Topics Concern  . Not on file   Social History Narrative   Patient is married [name]) and lives at home with her husband.   Patient has one child.   Patient has a high school education.   Patient is right-handed.   Caffeine Use: Occasionally    Current Outpatient Prescriptions on File Prior to Visit  Medication Sig Dispense Refill  . divalproex (DEPAKOTE ER) 500 MG 24 hr tablet Take 1 tablet (500 mg total) by mouth daily. 30 tablet 6  . escitalopram (LEXAPRO) 10 MG tablet Take 1 tablet (10 mg total) by mouth at bedtime. 90 tablet 1  . levothyroxine (SYNTHROID, LEVOTHROID) 75 MCG tablet Take 75 mcg by mouth daily before breakfast.    . magnesium gluconate (MAGONATE) 500 MG tablet Take 500 mg by mouth daily.     . rizatriptan (MAXALT-MLT) 10 MG disintegrating tablet Take 1 tablet (10 mg total) by mouth as needed for migraine. May repeat in 2 hours if needed 15 tablet 11  . Suvorexant (BELSOMRA) 15 MG TABS Take 15 mg by mouth at bedtime. (Patient taking differently: Take 20 mg by mouth at bedtime. ) 30 tablet 5   No current facility-administered medications  on file prior to visit.    Allergies  Allergen Reactions  . Codeine   . Statins     intolerance  . Sulfa Antibiotics     Review of Systems  Review of Systems  Constitutional: Negative for fever, chills and malaise/fatigue.  HENT: Negative for congestion, hearing loss and nosebleeds.   Eyes: Negative for discharge.  Respiratory: Negative for cough, sputum production, shortness of breath and wheezing.   Cardiovascular: Negative for chest pain, palpitations and leg swelling.  Gastrointestinal: Negative for heartburn, nausea, vomiting, abdominal pain, diarrhea, constipation and blood in stool.  Genitourinary: Negative for dysuria, urgency, frequency and hematuria.  Musculoskeletal: Negative for myalgias, back pain and falls.  Skin: Negative  for rash.  Neurological: Negative for dizziness, tremors, sensory change, focal weakness, loss of consciousness, weakness and headaches.  Endo/Heme/Allergies: Negative for polydipsia. Does not bruise/bleed easily.  Psychiatric/Behavioral: Negative for depression and suicidal ideas. The patient is not nervous/anxious and does not have insomnia.     Objective  BP 120/84 mmHg  Pulse 52  Temp(Src) 98 F (36.7 C) (Oral)  Ht 5\' 5"  (1.651 m)  Wt 179 lb 8 oz (81.421 kg)  BMI 29.87 kg/m2  SpO2 98%  Physical Exam  Physical Exam  Constitutional: She is oriented to person, place, and time and well-developed, well-nourished, and in no distress. No distress.  HENT:  Head: Normocephalic and atraumatic.  Eyes: Conjunctivae are normal.  Neck: Neck supple. No thyromegaly present.  Cardiovascular: Normal rate, regular rhythm and normal heart sounds.   No murmur heard. Pulmonary/Chest: Effort normal and breath sounds normal. She has no wheezes.  Abdominal: She exhibits no distension and no mass.  Musculoskeletal: She exhibits no edema.  Lymphadenopathy:    She has no cervical adenopathy.  Neurological: She is alert and oriented to person, place, and time.  Skin: Skin is warm and dry. No rash noted. She is not diaphoretic.  Psychiatric: Memory, affect and judgment normal.    Lab Results  Component Value Date   TSH 2.58 02/26/2015   Lab Results  Component Value Date   WBC 5.5 02/26/2015   HGB 12.4 02/26/2015   HCT 37.9 02/26/2015   MCV 83.8 02/26/2015   PLT 274.0 02/26/2015   Lab Results  Component Value Date   CREATININE 0.99 02/26/2015   BUN 19 02/26/2015   NA 137 02/26/2015   K 3.8 02/26/2015   CL 104 02/26/2015   CO2 26 02/26/2015   Lab Results  Component Value Date   ALT 15 02/26/2015   AST 26 02/26/2015   ALKPHOS 70 02/26/2015   BILITOT 0.5 02/26/2015   Lab Results  Component Value Date   CHOL 197 02/26/2015   Lab Results  Component Value Date   HDL 52.90  02/26/2015   Lab Results  Component Value Date   LDLCALC 127* 02/26/2015   Lab Results  Component Value Date   TRIG 88.0 02/26/2015   Lab Results  Component Value Date   CHOLHDL 4 02/26/2015     Assessment & Plan  Dysphagia, pharyngoesophageal phase Patient had been referred to GI by previous PMD but did not proceed has not had any further choking episodes recently. Encouraged to accept the referreal declines for now  Obesity Encouraged DASH diet, decrease po intake and increase exercise as tolerated. Needs 7-8 hours of sleep nightly. Avoid trans fats, eat small, frequent meals every 4-5 hours with lean proteins, complex carbs and healthy fats. Minimize simple carbs, GMO foods.  Migraine without aura  Follows with Dr Jaynee Eagles at [facility], has a sleep study ordered this week to investigate  Hypothyroidism On Levothyroxine, continue to monitor  Hyperlipidemia Encouraged heart healthy diet, increase exercise, avoid trans fats, consider a krill oil cap daily  Osteopenia Encouraged supplements of Vitamin D and calcium, increase exercise and monitor  Skin cancer Follows with Dr Marge Duncans no concerning lesions today  Anxiety state  widowed in 2013 after caring for her husband with Lewy Body Dementia for 6 years. Stable on current meds. No changes

## 2015-03-04 NOTE — Assessment & Plan Note (Signed)
  widowed in 2013 after caring for her husband with Lewy Body Dementia for 6 years. Stable on current meds. No changes

## 2015-03-04 NOTE — Assessment & Plan Note (Signed)
Follows with Dr Jaynee Eagles at [facility], has a sleep study ordered this week to investigate

## 2015-03-04 NOTE — Assessment & Plan Note (Signed)
Encouraged heart healthy diet, increase exercise, avoid trans fats, consider a krill oil cap daily 

## 2015-03-04 NOTE — Assessment & Plan Note (Signed)
>>  ASSESSMENT AND PLAN FOR HYPOTHYROIDISM WRITTEN ON 03/04/2015  8:24 PM BY BLYTH, STACEY A, MD  On Levothyroxine, continue to monitor

## 2015-03-04 NOTE — Assessment & Plan Note (Signed)
On Levothyroxine, continue to monitor 

## 2015-03-04 NOTE — Assessment & Plan Note (Signed)
Follows with Dr Marge Duncans no concerning lesions today

## 2015-03-04 NOTE — Assessment & Plan Note (Signed)
>>  ASSESSMENT AND PLAN FOR MIGRAINE WITHOUT AURA WRITTEN ON 03/04/2015  8:24 PM BY Bradd Canary, MD  Follows with Dr Lucia Gaskins at [facility], has a sleep study ordered this week to investigate

## 2015-03-04 NOTE — Assessment & Plan Note (Signed)
Encouraged supplements of Vitamin D and calcium, increase exercise and monitor

## 2015-03-05 ENCOUNTER — Ambulatory Visit: Admitting: Diagnostic Neuroimaging

## 2015-03-08 ENCOUNTER — Ambulatory Visit (INDEPENDENT_AMBULATORY_CARE_PROVIDER_SITE_OTHER): Payer: Medicare Other | Admitting: Neurology

## 2015-03-08 ENCOUNTER — Telehealth: Payer: Self-pay | Admitting: *Deleted

## 2015-03-08 ENCOUNTER — Encounter: Payer: Self-pay | Admitting: Neurology

## 2015-03-08 VITALS — BP 120/74 | HR 59 | Ht 65.0 in | Wt 177.4 lb

## 2015-03-08 DIAGNOSIS — G43011 Migraine without aura, intractable, with status migrainosus: Secondary | ICD-10-CM

## 2015-03-08 DIAGNOSIS — R1314 Dysphagia, pharyngoesophageal phase: Secondary | ICD-10-CM

## 2015-03-08 DIAGNOSIS — H538 Other visual disturbances: Secondary | ICD-10-CM | POA: Diagnosis not present

## 2015-03-08 DIAGNOSIS — R51 Headache: Secondary | ICD-10-CM | POA: Diagnosis not present

## 2015-03-08 DIAGNOSIS — R5382 Chronic fatigue, unspecified: Secondary | ICD-10-CM | POA: Diagnosis not present

## 2015-03-08 DIAGNOSIS — R519 Headache, unspecified: Secondary | ICD-10-CM

## 2015-03-08 MED ORDER — CYCLOBENZAPRINE HCL 10 MG PO TABS: 10.0000 mg | ORAL_TABLET | Freq: Every day | ORAL | Status: DC

## 2015-03-08 MED ORDER — DIVALPROEX SODIUM ER 250 MG PO TB24: 250.0000 mg | ORAL_TABLET | Freq: Every day | ORAL | Status: DC

## 2015-03-08 MED ORDER — ZONISAMIDE 100 MG PO CAPS: 100.0000 mg | ORAL_CAPSULE | Freq: Every day | ORAL | Status: DC

## 2015-03-08 NOTE — Progress Notes (Signed)
[facility]    Provider:  Dr [name] Nolan Referring Provider: Mosie Lukes, MD Primary Care Physician:  [name] Homans, MD  CC: Migraine for 7 weeks, intractable  Interval update: A week ago she had a migraine triggered by cologne. A maxalt helped a week ago but headache has lingered since then. Before that she had a previous headache due to stress. She is having swallowing problems, she has choked 3 times on solids. She was able to get the food up but almost choked. The third time she had the heimlich completed.  She stopped taking the Depakote on her own. It made her eat too much. Discussed trying Zonisamide for headaches as she has tried most first-line agents for her migraines, but a sulfa allergy is listed. She has never had a reaction to Sulfa drugs, patient denies sulfa allergy. Her sleep study is scheduled for Sunday night. She has blurry vision with the headaches.  01/25/2015: [name] Nolan is a 69 y.o. female here as a referral from Dr. Coralyn Nolan for chronic migraine  Interval update 01/25/2015: Patient reports the Verapamil is not helping. She is having significant sleep troubles. Since last seen she had a migraine cocktail. She has had headaches every single day since she was last here. She took a Recruitment consultant today. Excessive daytime drowsiness, Doesn't think she snores but not sure. Discussed migraine management, meds available, sleep study for possible OSA causing headaches or hypoventilation. Discussed at length. Will refer for botox if this regimen is not helpful.   Her fatigue severity scale today is 61. Sleepiness scale is 2. BMI 30.  01/10/2015: Migraines started years ago. Worsening after menopause. Worsening after 2007. Worse with stress. She is a former patient of Dr. Leta Nolan. She has headaches every other day. They started in the occipital area with pain and travel. Pulsating, moves to behind the eyes. +light sensitivity. Wants to go into a dark room.  +nause. No vomiting. They last up to all day long maybe longer. Trigger are spices, peanut butter, banana, chocolate. Had side effects to amitriptyline. Blurry vision with the headaches. No signs of sleep apnea. Failed relpax, failed topamax, failed propranolol, Failed maxalt, failt zomig. No aura.Tried amitriptyline, topiramate, propranolol. triptans don't work.   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 Reviewed notes,  Review of Systems: Patient complains of symptoms per HPI as well as the following symptoms: heat intolerance. Pertinent negatives per HPI. All others negative.   History   Social History  . Marital Status: Married    Spouse Name: [name] Nolan  . Number of Children: 1  . Years of Education: HS   Occupational History  . Retired    Social History Main Topics  . Smoking status: Never Smoker   . Smokeless tobacco: Never Used  . Alcohol Use: No  . Drug Use: No  . Sexual Activity: Yes    Birth Control/ Protection: Post-menopausal     Comment: lives with husband, no dietary restrictions, avoids caffeine, bananas, dairy   Other Topics Concern  . Not on file   Social History Narrative   Patient is married [name] Nolan) and lives at home with her husband.   Patient has one child.   Patient has a high school education.   Patient is right-handed.   Caffeine Use: Occasionally    Family History  Problem Relation Age of Onset  . Congestive Heart Failure Mother   . Leukemia Father   . Kidney disease Brother   . Cancer Brother  stage 4 kidney cancer  . Leukemia Maternal Aunt   . Congestive Heart Failure Maternal Grandmother     Past Medical History  Diagnosis Date  . Hypothyroid   . Migraine   . Anxiety   . Occipital neuralgia   . Insomnia   . Esophageal reflux   . Prolonged depressive reaction   . Hyperlipidemia   . Menopause   . Arrhythmia 01/25/2015    Per Dr. Jaynee Nolan, [facility]  . Arthritis   . Fainting     fainted twice  .  Dysphagia   . Osteopenia 02/26/2015  . Skin cancer 02/26/2015    Right ear Removed by Dr [name] Nolan    Past Surgical History  Procedure Laterality Date  . Cholecystectomy    . Tubal ligation    . Tooth implant      at least 5 years ago per pt    Current Outpatient Prescriptions  Medication Sig Dispense Refill  . ALPRAZolam (XANAX) 1 MG tablet Take 1/2 or one whole tablet hs prn sleep 30 tablet 1  . escitalopram (LEXAPRO) 10 MG tablet Take 1 tablet (10 mg total) by mouth at bedtime. 90 tablet 1  . levothyroxine (SYNTHROID, LEVOTHROID) 75 MCG tablet Take 75 mcg by mouth daily before breakfast.    . magnesium gluconate (MAGONATE) 500 MG tablet Take 500 mg by mouth daily.     . rizatriptan (MAXALT-MLT) 10 MG disintegrating tablet Take 1 tablet (10 mg total) by mouth as needed for migraine. May repeat in 2 hours if needed 15 tablet 11  . divalproex (DEPAKOTE ER) 500 MG 24 hr tablet Take 1 tablet (500 mg total) by mouth daily. (Patient not taking: Reported on 03/08/2015) 30 tablet 6  . Suvorexant (BELSOMRA) 15 MG TABS Take 15 mg by mouth at bedtime. (Patient not taking: Reported on 03/08/2015) 30 tablet 5   No current facility-administered medications for this visit.    Allergies as of 03/08/2015 - Review Complete 03/08/2015  Allergen Reaction Noted  . Codeine  07/12/2013  . Statins  05/07/2014  . Sulfa antibiotics  07/12/2013    Vitals: BP 120/74 mmHg  Pulse 59  Ht 5\' 5"  (1.651 m)  Wt 177 lb 6.4 oz (80.468 kg)  BMI 29.52 kg/m2 Last Weight:  Wt Readings from Last 1 Encounters:  03/08/15 177 lb 6.4 oz (80.468 kg)   Last Height:   Ht Readings from Last 1 Encounters:  03/08/15 5\' 5"  (1.651 m)    Physical exam: Exam: Gen: NAD, conversant, well nourised, obese, well groomed  CV: irregular, no MRG. No Carotid Bruits. No peripheral edema, warm, nontender Eyes: Conjunctivae clear without exudates or hemorrhage  Neuro: Detailed Neurologic Exam  Speech:   Speech is normal; fluent and spontaneous with normal comprehension.  Cognition:  The patient is oriented to person, place, and time;   recent and remote memory intact;   language fluent;   normal attention, concentration,   fund of knowledge Cranial Nerves:  The pupils are equal, round, and reactive to light. The fundi are normal and spontaneous venous pulsations are present. Visual fields are full to finger confrontation. Extraocular movements are intact. Trigeminal sensation is intact and the muscles of mastication are normal. The face is symmetric. The palate elevates in the midline. Hearing intact. Voice is normal. Shoulder shrug is normal. The tongue has normal motion without fasciculations.     Assessment/Plan: 69 year old patient to seen here today with chronic migraines with status migrainosus, intractable.  She has been on multiple  migraine preventative medications. Recently Verapamil and Depakote didn't help and she stopped them herself. There is a component of non-compliance and she just stops medications on her own when she doesn't feel they work. Tried to explain we would like the opportunity to titrate medications. Will try Zonisamide and flexeril qhs.  Sleep Study: would like eval for OSA and obesity hypoventilation. Will ask the sleep team to screen. Her fatigue severity scale today is 61. Sleepiness scale is 2. BMI 30. Morning, refractory headaches. Sleep study this Sunday.  Dysphagia: unexplained. Solid foods. Swallow study unrevealing. Will order mri of the brain.   Sarina Ill, MD  [facility] [address] [place], [place] [address]  Phone [phone] Fax [phone]  A total of 30 minutes was spent face-to-face with this patient. Over half this time was spent on counseling patient on the migraine,dysphagia,fatigue diagnosis and different diagnostic and therapeutic options available.

## 2015-03-08 NOTE — Telephone Encounter (Signed)
Spoke with pharmacists and asked them to cancel order for rx depakote. He cancelled depakote and I told him to keep the zonegran 100mg . He verbalized understanding and stated he cancelled depakote.

## 2015-03-08 NOTE — Patient Instructions (Addendum)
Remember to drink plenty of fluid, eat healthy meals and do not skip any meals. Try to eat protein with a every meal and eat a healthy snack such as fruit or nuts in between meals. Try to keep a regular sleep-wake schedule and try to exercise daily, particularly in the form of walking, 20-30 minutes a day, if you can.   As far as your medications are concerned, I would like to suggest: Zonismide 100mg  at night and Flexeril 5mg  or 10mg  at night. May take Flexeril 5 or 10mg  twice daily as well for headache as needed.   As far as diagnostic testing: MRI of the brain  I would like to see you back in 3 months, sooner if we need to. Please call us with any interim questions, concerns, problems, updates or refill requests.   Please also call us for any test results so we can go over those with you on the phone.  My clinical assistant and will answer any of your questions and relay your messages to me and also relay most of my messages to you.   Our phone number is [phone]. We also have an after hours call service for urgent matters and there is a physician on-call for urgent questions. For any emergencies you know to call 911 or go to the nearest emergency room

## 2015-03-11 ENCOUNTER — Ambulatory Visit (INDEPENDENT_AMBULATORY_CARE_PROVIDER_SITE_OTHER): Payer: Medicare Other | Admitting: Neurology

## 2015-03-11 VITALS — BP 128/79 | HR 62

## 2015-03-11 DIAGNOSIS — G479 Sleep disorder, unspecified: Secondary | ICD-10-CM

## 2015-03-11 DIAGNOSIS — G4720 Circadian rhythm sleep disorder, unspecified type: Secondary | ICD-10-CM

## 2015-03-11 DIAGNOSIS — G478 Other sleep disorders: Secondary | ICD-10-CM | POA: Diagnosis not present

## 2015-03-11 DIAGNOSIS — G4761 Periodic limb movement disorder: Secondary | ICD-10-CM

## 2015-03-11 DIAGNOSIS — R9431 Abnormal electrocardiogram [ECG] [EKG]: Secondary | ICD-10-CM

## 2015-03-11 DIAGNOSIS — R0683 Snoring: Secondary | ICD-10-CM

## 2015-03-12 NOTE — Sleep Study (Signed)
Please see the scanned sleep study interpretation located in the Procedure tab within the Chart Review section. 

## 2015-03-15 ENCOUNTER — Telehealth: Payer: Self-pay | Admitting: Neurology

## 2015-03-15 NOTE — Telephone Encounter (Signed)
Dr. Cathren Laine patient, seen by me on 02/08/15.  Please call and notify the patient that the recent sleep study did not show any significant obstructive sleep apnea. However, she had a lot of leg twitching in sleep.  Please inform patient that I would like to go over the details of the study during a follow up appointment and if not already previously scheduled, arrange a followup appointment (please utilize a followu-up slot). Also, route or fax report to PCP and referring MD, if other than PCP.  Once you have spoken to patient, you can close this encounter.   Thanks,  Star Age, MD, PhD [facility])

## 2015-03-16 NOTE — Telephone Encounter (Signed)
Patient is aware of results and was not able to make an appt right now. She will call back to make that appt.

## 2015-03-22 NOTE — Telephone Encounter (Signed)
Patient is returning a call to discuss sleep results because she did not understand. Please call.

## 2015-03-23 ENCOUNTER — Ambulatory Visit
Admission: RE | Admit: 2015-03-23 | Discharge: 2015-03-23 | Disposition: A | Discharge status: Home / Self Care | Payer: Medicare Other | Source: Ambulatory Visit | Attending: Neurology | Admitting: Neurology

## 2015-03-23 DIAGNOSIS — R1314 Dysphagia, pharyngoesophageal phase: Secondary | ICD-10-CM

## 2015-03-23 DIAGNOSIS — R51 Headache: Secondary | ICD-10-CM

## 2015-03-23 DIAGNOSIS — H538 Other visual disturbances: Secondary | ICD-10-CM

## 2015-03-23 DIAGNOSIS — R519 Headache, unspecified: Secondary | ICD-10-CM

## 2015-03-26 ENCOUNTER — Telehealth: Payer: Self-pay | Admitting: *Deleted

## 2015-03-26 NOTE — Telephone Encounter (Signed)
Thank you! Agree, she should follow up with pcp. thanks

## 2015-03-26 NOTE — Telephone Encounter (Signed)
Spoke w/ pt about normal MRI brain. Pt verbalized understanding. Also had some questions about her thyroid. Stated her PCP things she may have Hashimoto's disease and they sent her the lab results. I advised her to call her PCP office to clarify. She is going to call them. Told her I would let Dr. Jaynee Eagles know.

## 2015-03-26 NOTE — Telephone Encounter (Signed)
I spoke to patient and clarified the results of sleep study and advised her that Dr. Rexene Alberts would like to see her back for f/u appt. Patient was unable to make appt now due to driving but will call back later today to make appt.

## 2015-04-09 ENCOUNTER — Ambulatory Visit (INDEPENDENT_AMBULATORY_CARE_PROVIDER_SITE_OTHER): Payer: Medicare Other | Admitting: Neurology

## 2015-04-09 ENCOUNTER — Encounter: Payer: Self-pay | Admitting: Neurology

## 2015-04-09 VITALS — BP 104/60 | HR 58 | Resp 14 | Ht 65.0 in | Wt 176.0 lb

## 2015-04-09 DIAGNOSIS — G4761 Periodic limb movement disorder: Secondary | ICD-10-CM | POA: Diagnosis not present

## 2015-04-09 DIAGNOSIS — G2581 Restless legs syndrome: Secondary | ICD-10-CM

## 2015-04-09 MED ORDER — PRAMIPEXOLE DIHYDROCHLORIDE 0.125 MG PO TABS: ORAL_TABLET | ORAL | Status: DC

## 2015-04-09 NOTE — Patient Instructions (Signed)
Thankfully, you do not have obstructive sleep apnea. Your oxygen levels were slightly low, not enough to justify oxygen treatment.  For your restless legs and significant leg twitching in your sleep, let's try Mirapex (generic name: pramipexole) 0.125 mg: Take 1 pill each night for 1 week, the 2 pills each night for 1 week, then 3 pills each night thereafter. Common side effects reported are: Sedation, sleepiness, nausea, vomiting, and rare side effects are confusion, hallucinations, swelling in legs, and abnormal behaviors, including impulse control problems, which can manifest as excessive eating, obsessions with food or gambling, or hypersexuality.  You can Follow-up with Dr. Jaynee Eagles as planned.   I can see you back as needed.

## 2015-04-09 NOTE — Progress Notes (Signed)
Subjective:    Patient ID: [name] Nolan is a 69 y.o. female.  HPI     Interim history:   [name] Nolan is a 69 year old right-handed woman with an underlying medical history of hypothyroidism, migraine headaches, anxiety, reflux disease, hyperlipidemia, and obesity, who presents for follow-up consultation of her sleep disturbance, after her recent sleep study. The patient is accompanied by her husband today. I first met her on 02/08/2015 at the request of Dr. Jaynee Nolan, at which time the patient reported recurrent headaches, including morning headaches, nonrestorative sleep and daytime tiredness. I invited her back for sleep study. She had a baseline sleep study on 03/11/2015 and underwent over her test results with her in detail today. Her sleep efficiency was reduced at 68.9% with a prolonged sleep latency of 114 minutes and wake after sleep onset of 10 minutes with mild sleep fragmentation noted. She had a markedly increased percentage of stage II sleep, and absence of slow-wave and REM sleep. She had severe periodic leg movements at 96.1 per hour, resulting in only 3.2 arousals per hour. She had frequent PVCs on EKG. EEG was unremarkable. Mild snoring was noted. Total AHI was normal at 0.2 per hour, average oxygen saturation was 91%, nadir was 87%. Time below 90% saturation was 1 hour and 24 minutes. Time below 88% saturation was 3 minutes and 55 seconds.  Today, 04/09/2015: She reports doing fairly well. She does endorse restless leg symptoms. Her husband endorses that she twitches her legs in her sleep. She takes her Lexapro in the morning. Since she was placed on Zonegran she has had improvement in her headaches. She is trying to lose weight. She had double pneumonia as a child. She has no other history of recurrent pneumonia, asthma, bronchitis, smoking, secondhand smoke exposure or exposure to toxin at work.   Previously:   She often wakes up with a headache in the middle of the  night. In the recent past she has had almost a daily morning headache. She has a long-standing history of migraines. She was tried on amitriptyline recently but gained a lot of weight. She is trying to lose weight. She is off of amitriptyline. She was started on Depakote. Currently she takes 500 mg each night. She also takes as needed Maxalt. She snores. Her first husband used to tell her that she snores. She's not sure if she has breathing pauses while asleep. She endorses restless leg symptoms and twitching in her legs. Her current husband has obstructive sleep apnea and she is familiar with the diagnosis and with CPAP therapy. She watches TV at night. She has taken something for sleep for several years. She has been on Xanax 1 mg each night but currently is not taking it because she recently started Belsomra 15 mg, which she believes has been working fairly well. Bedtime is usually around 10 PM and she tries to take her sleeping pill 30-60 minutes before. She may be asleep within an hour. Her rise time is around 9:51 AM and she feels a little groggy. It takes her about an hour to get started. She does not drink caffeine and came off of it a few years ago secondary to recurrent headaches. She does not drink alcohol. She does not smoke. She is not aware of any family history of sleep disorders including obstructive sleep apnea. She has a lot of stress. Her Epworth sleepiness score is 1 out of 24 today. Her fatigue score is 44 out of 63 today.   Her Past  Medical History Is Significant For: Past Medical History  Diagnosis Date  . Hypothyroid   . Migraine   . Anxiety   . Occipital neuralgia   . Insomnia   . Esophageal reflux   . Prolonged depressive reaction   . Hyperlipidemia   . Menopause   . Arrhythmia 01/25/2015    Per Dr. Jaynee Nolan, [facility]  . Arthritis   . Fainting     fainted twice  . Dysphagia   . Osteopenia 02/26/2015  . Skin cancer 02/26/2015    Right ear Removed by Dr [name] Nolan     Her Past Surgical History Is Significant For: Past Surgical History  Procedure Laterality Date  . Cholecystectomy    . Tubal ligation    . Tooth implant      at least 5 years ago per pt    Her Family History Is Significant For: Family History  Problem Relation Age of Onset  . Congestive Heart Failure Mother   . Leukemia Father   . Kidney disease Brother   . Cancer Brother     stage 4 kidney cancer  . Leukemia Maternal Aunt   . Congestive Heart Failure Maternal Grandmother     Her Social History Is Significant For: History   Social History  . Marital Status: Married    Spouse Name: [name] Nolan  . Number of Children: 1  . Years of Education: HS   Occupational History  . Retired    Social History Main Topics  . Smoking status: Never Smoker   . Smokeless tobacco: Never Used  . Alcohol Use: No  . Drug Use: No  . Sexual Activity: Yes    Birth Control/ Protection: Post-menopausal     Comment: lives with husband, no dietary restrictions, avoids caffeine, bananas, dairy   Other Topics Concern  . None   Social History Narrative   Patient is married [name] Nolan) and lives at home with her husband.   Patient has one child.   Patient has a high school education.   Patient is right-handed.   Caffeine Use: Occasionally    Her Allergies Are:  Allergies  Allergen Reactions  . Codeine   . Statins     intolerance  :   Her Current Medications Are:  Outpatient Encounter Prescriptions as of 04/09/2015  Medication Sig  . ALPRAZolam (XANAX) 1 MG tablet Take 1/2 or one whole tablet hs prn sleep  . cyclobenzaprine (FLEXERIL) 10 MG tablet Take 1 tablet (10 mg total) by mouth at bedtime. Can also take 1/2 to one pill twice daily for headache.  . escitalopram (LEXAPRO) 10 MG tablet Take 1 tablet (10 mg total) by mouth at bedtime.  Marland Kitchen levothyroxine (SYNTHROID, LEVOTHROID) 75 MCG tablet Take 75 mcg by mouth daily before breakfast.  . magnesium gluconate (MAGONATE) 500 MG tablet Take 500  mg by mouth daily.   . rizatriptan (MAXALT-MLT) 10 MG disintegrating tablet Take 1 tablet (10 mg total) by mouth as needed for migraine. May repeat in 2 hours if needed  . zonisamide (ZONEGRAN) 100 MG capsule Take 1 capsule (100 mg total) by mouth at bedtime.   No facility-administered encounter medications on file as of 04/09/2015.  :  Review of Systems:  Out of a complete 14 point review of systems, all are reviewed and negative with the exception of these symptoms as listed below:   Review of Systems  All other systems reviewed and are negative.   Objective:  Neurologic Exam  Physical Exam Physical Examination:  Filed Vitals:   04/09/15 1509  BP: 104/60  Pulse: 58  Resp: 14    General Examination: The patient is a very pleasant 69 y.o. female in no acute distress. She appears well-developed and well-nourished and well groomed. She is mildly anxious appearing today.  HEENT: Normocephalic, atraumatic, pupils are equal, round and reactive to light and accommodation. Funduscopic exam is normal with sharp disc margins noted. Extraocular tracking is good without limitation to gaze excursion or nystagmus noted. Normal smooth pursuit is noted. Hearing is grossly intact. Face is symmetric with normal facial animation and normal facial sensation. Speech is clear with no dysarthria noted. There is no hypophonia. There is no lip, neck/head, jaw or voice tremor. Neck is supple with full range of passive and active motion. There are no carotid bruits on auscultation. Oropharynx exam reveals: mild mouth dryness, good dental hygiene and mild airway crowding, due to narrow airway entry. Mallampati is class II. Tongue protrudes centrally and palate elevates symmetrically. Tonsils are small or absent. She has a Mild overbite. Nasal inspection reveals no significant nasal mucosal bogginess or redness and no septal deviation.   Chest: Clear to auscultation without wheezing, rhonchi or crackles  noted.  Heart: S1+S2+0, regular and normal without murmurs, rubs or gallops noted.   Abdomen: Soft, non-tender and non-distended with normal bowel sounds appreciated on auscultation.  Extremities: There is no pitting edema in the distal lower extremities bilaterally. Pedal pulses are intact.  Skin: Warm and dry without trophic changes noted. There are no varicose veins.  Musculoskeletal: exam reveals no obvious joint deformities, tenderness or joint swelling or erythema.   Neurologically:  Mental status: The patient is awake, alert and oriented in all 4 spheres. Her immediate and remote memory, attention, language skills and fund of knowledge are appropriate. There is no evidence of aphasia, agnosia, apraxia or anomia. Speech is clear with normal prosody and enunciation. Thought process is linear. Mood is normal and affect is normal.  Cranial nerves II - XII are as described above under HEENT exam. In addition: shoulder shrug is normal with equal shoulder height noted. Motor exam: Normal bulk, strength and tone is noted. There is no drift, tremor or rebound. Romberg is negative. Reflexes are 2+ throughout. Fine motor skills and coordination: intact with normal finger taps, normal hand movements, normal rapid alternating patting, normal foot taps and normal foot agility.  Cerebellar testing: No dysmetria or intention tremor on finger to nose testing. Heel to shin is unremarkable bilaterally. There is no truncal or gait ataxia.  Sensory exam: intact to light touch in the upper and lower extremities.  Gait, station and balance: She stands easily. No veering to one side is noted. No leaning to one side is noted. Posture is age-appropriate and stance is narrow based. Gait shows normal stride length and normal pace. No problems turning are noted. She turns en bloc. Tandem walk is unremarkable.   Assessment and Plan:   In summary, [name] Nolan is a very pleasant 69 year old female with an  underlying medical history of hypothyroidism, migraine headaches, anxiety, reflux disease, hyperlipidemia, and obesity, who presents for follow-up consultation of her sleep disturbance, after her recent sleep study. Her sleep study was negative for obstructive sleep apnea. She had mildly low her baseline oxygen saturation since sleep. Losing weight will help. If she has any lung related complaints such as shortness of breath she is advised to discuss this with her primary care physician. She has an appointment with her in  September. From a sleep standpoint, she is advised to start a trial of Mirapex low-dose to help reduce her leg twitching at night and for symptomatic treatment of her restless leg symptoms. I suggested we start with Mirapex 0.125 mg strength, 1 pill each night for 1 week with gradual increased up to 3 pills each night. She was given a new prescription, and written instructions and we talked about potential side effects. She is advised to follow-up with Dr. Jaynee Nolan as planned and has an appointment pending for next month which would be a good time to recheck.  I answered all her questions today and the patient was in agreement. I can see her back on an as-needed basis.  I spent 20 minutes in total face-to-face time with the patient, more than 50% of which was spent in counseling and coordination of care, reviewing test results, reviewing medication and discussing or reviewing the diagnosis of PLMD and RLS, the prognosis and treatment options.

## 2015-04-25 ENCOUNTER — Encounter: Payer: Self-pay | Admitting: Neurology

## 2015-04-25 ENCOUNTER — Ambulatory Visit (INDEPENDENT_AMBULATORY_CARE_PROVIDER_SITE_OTHER): Payer: Medicare Other | Admitting: Neurology

## 2015-04-25 VITALS — BP 148/85 | HR 70 | Ht 65.0 in | Wt 178.8 lb

## 2015-04-25 DIAGNOSIS — G43001 Migraine without aura, not intractable, with status migrainosus: Secondary | ICD-10-CM

## 2015-04-25 DIAGNOSIS — E538 Deficiency of other specified B group vitamins: Secondary | ICD-10-CM

## 2015-04-25 DIAGNOSIS — D518 Other vitamin B12 deficiency anemias: Secondary | ICD-10-CM

## 2015-04-25 DIAGNOSIS — G4761 Periodic limb movement disorder: Secondary | ICD-10-CM | POA: Diagnosis not present

## 2015-04-25 DIAGNOSIS — R790 Abnormal level of blood mineral: Secondary | ICD-10-CM

## 2015-04-25 DIAGNOSIS — G2581 Restless legs syndrome: Secondary | ICD-10-CM | POA: Diagnosis not present

## 2015-04-25 DIAGNOSIS — G43719 Chronic migraine without aura, intractable, without status migrainosus: Secondary | ICD-10-CM

## 2015-04-25 MED ORDER — CYCLOBENZAPRINE HCL 10 MG PO TABS: 10.0000 mg | ORAL_TABLET | Freq: Every day | ORAL | Status: DC

## 2015-04-25 MED ORDER — ZONISAMIDE 100 MG PO CAPS: 100.0000 mg | ORAL_CAPSULE | Freq: Every day | ORAL | Status: DC

## 2015-04-25 NOTE — Progress Notes (Addendum)
[name]    Provider:  Dr [name] Nolan Referring Provider: Mosie Lukes, MD Primary Care Physician:  [name] Homans, MD  [facility]    Provider: Dr [name] Nolan Referring Provider: Mosie Lukes, MD Primary Care Physician: [name] Homans, MD  CC: Migraine for 7 weeks, intractable  Interval history: Migraines worsened. Having 20 headache days a month with 15 being migrainous. This frequency has been ongoing since December, 5 months. They started in the occipital area with pain and travel unilaterally. Pulsating, pounding, throbbing moves to behind the eye. +light sensitivity. Wants to go into a dark room. +nause. No vomiting. They last up to all day long maybe longer than 24 hours. Severe 8/10 on average.  Blurry vision with the headaches. No signs of sleep apnea. No medication obveruse headache   Failed relpax, failed topamax, failed propranolol, Failed maxalt, failt zomig. No aura.Tried amitriptyline, topiramate, propranolol. triptans don't work, failed depakote, verapamil. Tried migranal.  On Zonisamide now. Recommend Botox.    Interval update 04/25/2015: Her headaches are better. Sleep study revealed 96.1 PLMS per hour with 3 arousals per hour. No OSA. No side effects from the Zonisamide. She still has insomnia. She was given Mirapex but she is unsure of she wants to take it. She endorses symptoms of RLS and we will test her ferritin today. Headaches are largely gone. Will also check her B12 due to deficiency in the past. Discussed PLMS, she does not feel her legs moving. She does have RLS as well. She is on the Zonisamide without side effects.   Interval update: A week ago she had a migraine triggered by cologne. A maxalt helped a week ago but headache has lingered since then. Before that she had a previous headache due to stress. She is having swallowing problems, she has choked 3 times on solids. She was able to get the food up but almost choked. The  third time she had the heimlich completed. She stopped taking the Depakote on her own. It made her eat too much. Discussed trying Zonisamide for headaches as she has tried most first-line agents for her migraines, but a sulfa allergy is listed. She has never had a reaction to Sulfa drugs, patient denies sulfa allergy. Her sleep study is scheduled for Sunday night. She has blurry vision with the headaches.  01/25/2015: [name] Nolan is a 69 y.o. female here as a referral from Dr. Coralyn Nolan for chronic migraine  Interval update 01/25/2015: Patient reports the Verapamil is not helping. She is having significant sleep troubles. Since last seen she had a migraine cocktail. She has had headaches every single day since she was last here. She took a Recruitment consultant today. Excessive daytime drowsiness, Doesn't think she snores but not sure. Discussed migraine management, meds available, sleep study for possible OSA causing headaches or hypoventilation. Discussed at length. Will refer for botox if this regimen is not helpful.   Her fatigue severity scale today is 61. Sleepiness scale is 2. BMI 30.  01/10/2015: Migraines started years ago. Worsening after menopause. Worsening after 2007. Worse with stress. She is a former patient of Dr. Leta Nolan. She has headaches every other day. They started in the occipital area with pain and travel. Pulsating, moves to behind the eyes. +light sensitivity. Wants to go into a dark room. +nause. No vomiting. They last up to all day long maybe longer. Trigger are spices, peanut butter, banana, chocolate. Had side effects to amitriptyline. Blurry vision with the headaches. No signs of sleep apnea.  Failed relpax, failed topamax, failed propranolol, Failed maxalt, failt zomig. No aura.Tried amitriptyline, topiramate, propranolol. triptans don't work.   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 Reviewed notes,   Review of Systems: Patient complains of  symptoms per HPI as well as the following symptoms: heat intolerance, excessive sweating, flushing, shortness of breath, headache, joint pain, restless leg. Pertinent negatives per HPI. All others negative.   Social History   Social History  . Marital Status: Married    Spouse Name: [name] Nolan  . Number of Children: 1  . Years of Education: HS   Occupational History  . Retired    Social History Main Topics  . Smoking status: Never Smoker   . Smokeless tobacco: Never Used  . Alcohol Use: No  . Drug Use: No  . Sexual Activity: Yes    Birth Control/ Protection: Post-menopausal     Comment: lives with husband, no dietary restrictions, avoids caffeine, bananas, dairy   Other Topics Concern  . Not on file   Social History Narrative   Patient is married [name] Nolan) and lives at home with her husband.   Patient has one child.   Patient has a high school education.   Patient is right-handed.   Caffeine Use: Occasionally    Family History  Problem Relation Age of Onset  . Congestive Heart Failure Mother   . Leukemia Father   . Kidney disease Brother   . Cancer Brother     stage 4 kidney cancer  . Leukemia Maternal Aunt   . Congestive Heart Failure Maternal Grandmother     Past Medical History  Diagnosis Date  . Hypothyroid   . Migraine   . Anxiety   . Occipital neuralgia   . Insomnia   . Esophageal reflux   . Prolonged depressive reaction   . Hyperlipidemia   . Menopause   . Arrhythmia 01/25/2015    Per Dr. Jaynee Nolan, [facility]  . Arthritis   . Fainting     fainted twice  . Dysphagia   . Osteopenia 02/26/2015  . Skin cancer 02/26/2015    Right ear Removed by Dr [name] Nolan    Past Surgical History  Procedure Laterality Date  . Cholecystectomy    . Tubal ligation    . Tooth implant      at least 5 years ago per pt    Current Outpatient Prescriptions  Medication Sig Dispense Refill  . ALPRAZolam (XANAX) 1 MG tablet Take 1/2 or one whole tablet hs prn sleep 30  tablet 1  . cyclobenzaprine (FLEXERIL) 10 MG tablet Take 1 tablet (10 mg total) by mouth at bedtime. Can also take 1/2 to one pill twice daily for headache. 90 tablet 6  . escitalopram (LEXAPRO) 10 MG tablet Take 1 tablet (10 mg total) by mouth at bedtime. 90 tablet 1  . levothyroxine (SYNTHROID, LEVOTHROID) 75 MCG tablet Take 75 mcg by mouth daily before breakfast.    . magnesium gluconate (MAGONATE) 500 MG tablet Take 500 mg by mouth daily.     . pramipexole (MIRAPEX) 0.125 MG tablet 1 pill each night x 1 week, then 2 pills each night x 1 week, then 3 pills each night thereafter. Take 90-120 minutes before bedtime. 90 tablet 5  . rizatriptan (MAXALT-MLT) 10 MG disintegrating tablet Take 1 tablet (10 mg total) by mouth as needed for migraine. May repeat in 2 hours if needed 15 tablet 11  . zonisamide (ZONEGRAN) 100 MG capsule Take 1 capsule (100  mg total) by mouth at bedtime. 30 capsule 6   No current facility-administered medications for this visit.    Allergies as of 04/25/2015 - Review Complete 04/25/2015  Allergen Reaction Noted  . Codeine  07/12/2013  . Statins  05/07/2014    Vitals: BP 148/85 mmHg  Pulse 70  Ht 5\' 5"  (1.651 m)  Wt 178 lb 12.8 oz (81.103 kg)  BMI 29.75 kg/m2 Last Weight:  Wt Readings from Last 1 Encounters:  04/25/15 178 lb 12.8 oz (81.103 kg)   Last Height:   Ht Readings from Last 1 Encounters:  04/25/15 5\' 5"  (1.651 m)    Speech:  Speech is normal; fluent and spontaneous with normal comprehension.  Cognition:  The patient is oriented to person, place, and time;   recent and remote memory intact;   language fluent;   normal attention, concentration,   fund of knowledge Cranial Nerves:  The pupils are equal, round, and reactive to light. The fundi are normal and spontaneous venous pulsations are present. Visual fields are full to finger confrontation. Extraocular movements are intact. Trigeminal sensation is intact and the muscles of  mastication are normal. The face is symmetric. The palate elevates in the midline. Hearing intact. Voice is normal. Shoulder shrug is normal. The tongue has normal motion without fasciculations.     Assessment/Plan: 69 year old patient to seen here today with chronic migraines with status migrainosus, intractable. Improved with zonisamide. She was diagnosed with Periodic Limb Movements of sleep, 96 an hour. Don't bother her. But she has RLS as well. MRi of the brain was unremarkable.   Continue  Zonisamide and flexeril qhs.  Sleep Study: diagnosed with PLMS. Also with RLS. Will check ferritin  B12 deficiency: will check B12    Sarina Ill, MD  [facility] [address] [place], [place] [address]  Phone [phone] Fax [phone]  A total of 30 minutes was spent face-to-face with this patient. Over half this time was spent on counseling patient on the migraine, PLMS diagnosis and different diagnostic and therapeutic options available.

## 2015-04-25 NOTE — Patient Instructions (Signed)
Overall you are doing fairly well but I do want to suggest a few things today:   Remember to drink plenty of fluid, eat healthy meals and do not skip any meals. Try to eat protein with a every meal and eat a healthy snack such as fruit or nuts in between meals. Try to keep a regular sleep-wake schedule and try to exercise daily, particularly in the form of walking, 20-30 minutes a day, if you can.   As far as your medications are concerned, I would like to suggest: continue current medications  I would like to see you back in 6 months, sooner if we need to. Please call us with any interim questions, concerns, problems, updates or refill requests.   Please also call us for any test results so we can go over those with you on the phone.  My clinical assistant and will answer any of your questions and relay your messages to me and also relay most of my messages to you.   Our phone number is [phone]. We also have an after hours call service for urgent matters and there is a physician on-call for urgent questions. For any emergencies you know to call 911 or go to the nearest emergency room   

## 2015-04-26 ENCOUNTER — Telehealth: Payer: Self-pay | Admitting: Neurology

## 2015-04-26 LAB — CBC
HEMOGLOBIN: 12.2 g/dL (ref 11.1–15.9)
Hematocrit: 37.9 % (ref 34.0–46.6)
MCH: 27.9 pg (ref 26.6–33.0)
MCHC: 32.2 g/dL (ref 31.5–35.7)
MCV: 87 fL (ref 79–97)
Platelets: 272 10*3/uL (ref 150–379)
RBC: 4.37 x10E6/uL (ref 3.77–5.28)
RDW: 15.5 % — ABNORMAL HIGH (ref 12.3–15.4)
WBC: 4.6 10*3/uL (ref 3.4–10.8)

## 2015-04-26 LAB — B12 AND FOLATE PANEL
Folate: >20 ng/mL (ref >3.0)
Vitamin B-12: 172 pg/mL — ABNORMAL LOW (ref 211–946)

## 2015-04-26 LAB — FERRITIN: Ferritin: 11 ng/mL — ABNORMAL LOW (ref 15–150)

## 2015-04-26 NOTE — Telephone Encounter (Signed)
B12 was low. Take 2013mcg a day. Also feritin level low. Take 325mg  ferrous sulfate bid to tid with vitamin C. Spoke to patient, she acknowledged

## 2015-05-24 ENCOUNTER — Other Ambulatory Visit (INDEPENDENT_AMBULATORY_CARE_PROVIDER_SITE_OTHER): Payer: Self-pay

## 2015-05-24 ENCOUNTER — Other Ambulatory Visit: Payer: Self-pay | Admitting: Neurology

## 2015-05-24 DIAGNOSIS — W57XXXA Bitten or stung by nonvenomous insect and other nonvenomous arthropods, initial encounter: Secondary | ICD-10-CM

## 2015-05-24 DIAGNOSIS — E538 Deficiency of other specified B group vitamins: Secondary | ICD-10-CM

## 2015-05-24 DIAGNOSIS — Z0289 Encounter for other administrative examinations: Secondary | ICD-10-CM

## 2015-05-24 DIAGNOSIS — G2581 Restless legs syndrome: Secondary | ICD-10-CM

## 2015-05-24 MED ORDER — ZONISAMIDE 100 MG PO CAPS: 200.0000 mg | ORAL_CAPSULE | Freq: Every day | ORAL | Status: DC

## 2015-05-24 MED ORDER — RIZATRIPTAN BENZOATE 10 MG PO TBDP: 10.0000 mg | ORAL_TABLET | ORAL | Status: DC | PRN

## 2015-05-24 MED ORDER — ZONISAMIDE 100 MG PO CAPS: 100.0000 mg | ORAL_CAPSULE | Freq: Every day | ORAL | Status: DC

## 2015-05-24 MED ORDER — ALPRAZOLAM 1 MG PO TABS: ORAL_TABLET | ORAL | Status: DC

## 2015-05-25 LAB — B12 AND FOLATE PANEL: Vitamin B-12: 1149 pg/mL — ABNORMAL HIGH (ref 211–946)

## 2015-05-25 LAB — FERRITIN: FERRITIN: 36 ng/mL (ref 15–150)

## 2015-05-25 LAB — B. BURGDORFI ANTIBODIES

## 2015-05-28 ENCOUNTER — Telehealth: Payer: Self-pay | Admitting: *Deleted

## 2015-05-28 NOTE — Telephone Encounter (Signed)
Called and spoke w/ pt to let her know Dr. Jaynee Eagles does not think the medication is causing her pain. She is not aware of it causing joint problems. Pt verbalized understanding.

## 2015-05-28 NOTE — Telephone Encounter (Signed)
I am not aware of it causing joint problems, thanks

## 2015-05-28 NOTE — Telephone Encounter (Signed)
Tried calling pt at home number to discuss lab results. Phone kept ringing and did not go to VM. Will try again later.

## 2015-05-28 NOTE — Telephone Encounter (Signed)
Called and spoke w/ pt about lab results. Told her B12 and Ferritin levels increased. Lyme test was negative. She should continue to take B12 and folate. She verbalized understanding.  Pt is wondering if Zonegran that she takes every night, can cause joint pain. I told her I will ask Dr. Jaynee Eagles and call her back either today or tomorrow. She verbalized understanding.

## 2015-05-28 NOTE — Telephone Encounter (Signed)
LVM for pt to call back about lab results. Gave [facility] phone number and office hours.  

## 2015-05-29 ENCOUNTER — Ambulatory Visit (INDEPENDENT_AMBULATORY_CARE_PROVIDER_SITE_OTHER): Payer: Medicare Other | Admitting: Family Medicine

## 2015-05-29 ENCOUNTER — Encounter: Payer: Self-pay | Admitting: Family Medicine

## 2015-05-29 VITALS — BP 125/79 | HR 79 | Temp 98.9°F | Ht 65.0 in | Wt 180.8 lb

## 2015-05-29 DIAGNOSIS — E669 Obesity, unspecified: Secondary | ICD-10-CM | POA: Diagnosis not present

## 2015-05-29 DIAGNOSIS — E785 Hyperlipidemia, unspecified: Secondary | ICD-10-CM | POA: Diagnosis not present

## 2015-05-29 DIAGNOSIS — E038 Other specified hypothyroidism: Secondary | ICD-10-CM | POA: Diagnosis not present

## 2015-05-29 DIAGNOSIS — Z23 Encounter for immunization: Secondary | ICD-10-CM

## 2015-05-29 DIAGNOSIS — H6691 Otitis media, unspecified, right ear: Secondary | ICD-10-CM | POA: Diagnosis not present

## 2015-05-29 DIAGNOSIS — J209 Acute bronchitis, unspecified: Secondary | ICD-10-CM | POA: Insufficient documentation

## 2015-05-29 MED ORDER — AMOXICILLIN-POT CLAVULANATE 875-125 MG PO TABS: 1.0000 | ORAL_TABLET | Freq: Two times a day (BID) | ORAL | Status: DC

## 2015-05-29 MED ORDER — INFLUENZA VAC SPLIT QUAD 0.5 ML IM SUSY
0.5000 mL | PREFILLED_SYRINGE | INTRAMUSCULAR | Status: AC
  Administered 2015-05-29: 0.5 mL via INTRAMUSCULAR

## 2015-05-29 MED ORDER — ESCITALOPRAM OXALATE 10 MG PO TABS: 10.0000 mg | ORAL_TABLET | Freq: Every day | ORAL | Status: DC

## 2015-05-29 NOTE — Assessment & Plan Note (Signed)
Had a cartilage piercing recently that would not heal so she took out the earring but it continues to be red, swollen and painful. Started on Augmentin and probiotocs daily

## 2015-05-29 NOTE — Progress Notes (Signed)
Pre visit review using our clinic review tool, if applicable. No additional management support is needed unless otherwise documented below in the visit note. 

## 2015-05-29 NOTE — Patient Instructions (Addendum)
Try Salon Pas gel  or Aspercreme to knee   Probiotics Digestive Advantage or [facility]  Otitis Externa Otitis externa is a bacterial or fungal infection of the outer ear canal. This is the area from the eardrum to the outside of the ear. Otitis externa is sometimes called "swimmer's ear." CAUSES  Possible causes of infection include:  Swimming in dirty water.  Moisture remaining in the ear after swimming or bathing.  Mild injury (trauma) to the ear.  Objects stuck in the ear (foreign body).  Cuts or scrapes (abrasions) on the outside of the ear. SIGNS AND SYMPTOMS  The first symptom of infection is often itching in the ear canal. Later signs and symptoms may include swelling and redness of the ear canal, ear pain, and yellowish-white fluid (pus) coming from the ear. The ear pain may be worse when pulling on the earlobe. DIAGNOSIS  Your health care provider will perform a physical exam. A sample of fluid may be taken from the ear and examined for bacteria or fungi. TREATMENT  Antibiotic ear drops are often given for 10 to 14 days. Treatment may also include pain medicine or corticosteroids to reduce itching and swelling. HOME CARE INSTRUCTIONS   Apply antibiotic ear drops to the ear canal as prescribed by your health care provider.  Take medicines only as directed by your health care provider.  If you have diabetes, follow any additional treatment instructions from your health care provider.  Keep all follow-up visits as directed by your health care provider. PREVENTION   Keep your ear dry. Use the corner of a towel to absorb water out of the ear canal after swimming or bathing.  Avoid scratching or putting objects inside your ear. This can damage the ear canal or remove the protective wax that lines the canal. This makes it easier for bacteria and fungi to grow.  Avoid swimming in lakes, polluted water, or poorly chlorinated pools.  You may use ear drops made of  rubbing alcohol and vinegar after swimming. Combine equal parts of white vinegar and alcohol in a bottle. Put 3 or 4 drops into each ear after swimming. SEEK MEDICAL CARE IF:   You have a fever.  Your ear is still red, swollen, painful, or draining pus after 3 days.  Your redness, swelling, or pain gets worse.  You have a severe headache.  You have redness, swelling, pain, or tenderness in the area behind your ear. MAKE SURE YOU:   Understand these instructions.  Will watch your condition.  Will get help right away if you are not doing well or get worse. Document Released: 09/01/2005 Document Revised: 01/16/2014 Document Reviewed: 09/18/2011 [facility] Patient Information 2015 [place], [place]. This information is not intended to replace advice given to you by your health care provider. Make sure you discuss any questions you have with your health care provider.

## 2015-06-10 NOTE — Assessment & Plan Note (Signed)
>>  ASSESSMENT AND PLAN FOR OBESITY WRITTEN ON 06/10/2015  3:51 PM BY BLYTH, STACEY A, MD  Encouraged DASH diet, decrease po intake and increase exercise as tolerated. Needs 7-8 hours of sleep nightly. Avoid trans fats, eat small, frequent meals every 4-5 hours with lean proteins, complex carbs and healthy fats. Minimize simple carbs, GMO foods.

## 2015-06-10 NOTE — Assessment & Plan Note (Signed)
>>  ASSESSMENT AND PLAN FOR HYPOTHYROIDISM WRITTEN ON 06/10/2015  3:52 PM BY BLYTH, STACEY A, MD  On Levothyroxine, continue to monitor

## 2015-06-10 NOTE — Progress Notes (Signed)
Subjective:    Patient ID: [name] Nolan, female    DOB: [birth year], 69 y.o.   MRN: [id number]  Chief Complaint  Patient presents with  . Follow-up    HPI Patient is in today for follow-up. Is struggling with some pain in her right ear status post a piercing that went bad. She had the patient back in May and after numerous attempts to clean and continue to use the piercing she has stopped using it but the pain and redness are persistent. No fevers or chills. Headaches have improved and she is following with Dr. Jaynee Eagles of neurology at this time. Is struggling with some intermittent left knee pain both follows with Dr. Shela Leff orthopedics for that. No recent trauma or injury. Denies CP/palp/SOB/HA/congestion/fevers/GI or GU c/o. Taking meds as prescribed  Past Medical History  Diagnosis Date  . Hypothyroid   . Migraine   . Anxiety   . Occipital neuralgia   . Insomnia   . Esophageal reflux   . Prolonged depressive reaction   . Hyperlipidemia   . Menopause   . Arrhythmia 01/25/2015    Per Dr. Jaynee Eagles, [facility]  . Arthritis   . Fainting     fainted twice  . Dysphagia   . Osteopenia 02/26/2015  . Skin cancer 02/26/2015    Right ear Removed by Dr Syble Creek    Past Surgical History  Procedure Laterality Date  . Cholecystectomy    . Tubal ligation    . Tooth implant      at least 5 years ago per pt    Family History  Problem Relation Age of Onset  . Congestive Heart Failure Mother   . Leukemia Father   . Kidney disease Brother   . Cancer Brother     stage 4 kidney cancer  . Leukemia Maternal Aunt   . Congestive Heart Failure Maternal Grandmother     Social History   Social History  . Marital Status: Married    Spouse Name: [name] Nolan  . Number of Children: 1  . Years of Education: HS   Occupational History  . Retired    Social History Main Topics  . Smoking status: Never Smoker   . Smokeless tobacco: Never Used  . Alcohol Use: No  . Drug Use: No    . Sexual Activity: Yes    Birth Control/ Protection: Post-menopausal     Comment: lives with husband, no dietary restrictions, avoids caffeine, bananas, dairy   Other Topics Concern  . Not on file   Social History Narrative   Patient is married [name] Nolan) and lives at home with her husband.   Patient has one child.   Patient has a high school education.   Patient is right-handed.   Caffeine Use: Occasionally    Outpatient Prescriptions Prior to Visit  Medication Sig Dispense Refill  . ALPRAZolam (XANAX) 1 MG tablet Take 1/2 or one whole tablet hs prn sleep 30 tablet 4  . cyclobenzaprine (FLEXERIL) 10 MG tablet Take 1 tablet (10 mg total) by mouth at bedtime. Can also take 1/2 to one pill twice daily for headache. 90 tablet 11  . levothyroxine (SYNTHROID, LEVOTHROID) 75 MCG tablet Take 75 mcg by mouth daily before breakfast.    . magnesium gluconate (MAGONATE) 500 MG tablet Take 500 mg by mouth daily.     . pramipexole (MIRAPEX) 0.125 MG tablet 1 pill each night x 1 week, then 2 pills each night x 1 week, then 3 pills each night thereafter. Take  90-120 minutes before bedtime. 90 tablet 5  . rizatriptan (MAXALT-MLT) 10 MG disintegrating tablet Take 1 tablet (10 mg total) by mouth as needed for migraine. May repeat in 2 hours if needed 15 tablet 11  . zonisamide (ZONEGRAN) 100 MG capsule Take 2 capsules (200 mg total) by mouth at bedtime. 60 capsule 11  . escitalopram (LEXAPRO) 10 MG tablet Take 1 tablet (10 mg total) by mouth at bedtime. 90 tablet 1   No facility-administered medications prior to visit.    Allergies  Allergen Reactions  . Codeine   . Statins     intolerance    Review of Systems  Constitutional: Negative for fever, chills and malaise/fatigue.  HENT: Positive for ear pain. Negative for congestion and hearing loss.   Eyes: Negative for discharge.  Respiratory: Negative for cough, sputum production and shortness of breath.   Cardiovascular: Negative for chest pain,  palpitations and leg swelling.  Gastrointestinal: Negative for heartburn, nausea, vomiting, abdominal pain, diarrhea, constipation and blood in stool.  Genitourinary: Negative for dysuria, urgency, frequency and hematuria.  Musculoskeletal: Negative for myalgias, back pain and falls.  Skin: Negative for rash.  Neurological: Positive for headaches. Negative for dizziness, sensory change, loss of consciousness and weakness.  Endo/Heme/Allergies: Negative for environmental allergies. Does not bruise/bleed easily.  Psychiatric/Behavioral: Negative for depression and suicidal ideas. The patient is not nervous/anxious and does not have insomnia.        Objective:    Physical Exam  Constitutional: She is oriented to person, place, and time. She appears well-developed and well-nourished. No distress.  HENT:  Head: Normocephalic and atraumatic.  Nose: Nose normal.  Eyes: Right eye exhibits no discharge. Left eye exhibits no discharge.  Neck: Normal range of motion. Neck supple.  Cardiovascular: Normal rate and regular rhythm.   No murmur heard. Pulmonary/Chest: Effort normal and breath sounds normal.  Abdominal: Soft. Bowel sounds are normal. There is no tenderness.  Musculoskeletal: She exhibits no edema.  Neurological: She is alert and oriented to person, place, and time.  Skin: Skin is warm and dry.  Psychiatric: She has a normal mood and affect.  Nursing note and vitals reviewed.   BP 125/79 mmHg  Pulse 79  Temp(Src) 98.9 F (37.2 C) (Oral)  Ht 5\' 5"  (1.651 m)  Wt 180 lb 12.8 oz (82.01 kg)  BMI 30.09 kg/m2  SpO2 97% Wt Readings from Last 3 Encounters:  05/29/15 180 lb 12.8 oz (82.01 kg)  04/25/15 178 lb 12.8 oz (81.103 kg)  04/09/15 176 lb (79.833 kg)     Lab Results  Component Value Date   WBC 4.6 04/25/2015   HGB 12.4 02/26/2015   HCT 37.9 04/25/2015   PLT 274.0 02/26/2015   GLUCOSE 71 02/26/2015   CHOL 197 02/26/2015   TRIG 88.0 02/26/2015   HDL 52.90 02/26/2015     LDLCALC 127* 02/26/2015   ALT 15 02/26/2015   AST 26 02/26/2015   NA 137 02/26/2015   K 3.8 02/26/2015   CL 104 02/26/2015   CREATININE 0.99 02/26/2015   BUN 19 02/26/2015   CO2 26 02/26/2015   TSH 2.58 02/26/2015    Lab Results  Component Value Date   TSH 2.58 02/26/2015   Lab Results  Component Value Date   WBC 4.6 04/25/2015   HGB 12.4 02/26/2015   HCT 37.9 04/25/2015   MCV 83.8 02/26/2015   PLT 274.0 02/26/2015   Lab Results  Component Value Date   NA 137 02/26/2015   K  3.8 02/26/2015   CO2 26 02/26/2015   GLUCOSE 71 02/26/2015   BUN 19 02/26/2015   CREATININE 0.99 02/26/2015   BILITOT 0.5 02/26/2015   ALKPHOS 70 02/26/2015   AST 26 02/26/2015   ALT 15 02/26/2015   PROT 7.1 02/26/2015   ALBUMIN 4.3 02/26/2015   CALCIUM 9.9 02/26/2015   ANIONGAP 11 05/04/2014   GFR 59.39* 02/26/2015   Lab Results  Component Value Date   CHOL 197 02/26/2015   Lab Results  Component Value Date   HDL 52.90 02/26/2015   Lab Results  Component Value Date   LDLCALC 127* 02/26/2015   Lab Results  Component Value Date   TRIG 88.0 02/26/2015   Lab Results  Component Value Date   CHOLHDL 4 02/26/2015   No results found for: HGBA1C     Assessment & Plan:   Problem List Items Addressed This Visit    Obesity    Encouraged DASH diet, decrease po intake and increase exercise as tolerated. Needs 7-8 hours of sleep nightly. Avoid trans fats, eat small, frequent meals every 4-5 hours with lean proteins, complex carbs and healthy fats. Minimize simple carbs, GMO foods.      Hypothyroidism    On Levothyroxine, continue to monitor      Hyperlipidemia    Encouraged heart healthy diet, increase exercise, avoid trans fats, consider a krill oil cap daily      Acute ear infection    Had a cartilage piercing recently that would not heal so she took out the earring but it continues to be red, swollen and painful. Started on Augmentin and probiotocs daily      Relevant  Medications   Influenza vac split quadrivalent PF (FLUARIX) injection 0.5 mL (Completed)   amoxicillin-clavulanate (AUGMENTIN) 875-125 MG per tablet    Other Visit Diagnoses    Need for prophylactic vaccination and inoculation against influenza    -  Primary    Relevant Medications    Influenza vac split quadrivalent PF (FLUARIX) injection 0.5 mL (Completed)       I am having Ms. Stovall-Edwards start on amoxicillin-clavulanate. I am also having her maintain her levothyroxine, magnesium gluconate, pramipexole, cyclobenzaprine, rizatriptan, ALPRAZolam, zonisamide, B Complex Vitamins (B-COMPLEX/B-12 PO), cholecalciferol, ferrous sulfate, calcium carbonate, and escitalopram. We administered Influenza vac split quadrivalent PF.  Meds ordered this encounter  Medications  . B Complex Vitamins (B-COMPLEX/B-12 PO)    Sig: Take 2,500 mg by mouth.  . cholecalciferol (VITAMIN D) 400 UNITS TABS tablet    Sig: Take 1,000 Units by mouth.  . ferrous sulfate 325 (65 FE) MG tablet    Sig: Take 325 mg by mouth daily with breakfast.  . Calcium Carbonate 1500 (600 CA) MG TABS    Sig: Take by mouth.  . Influenza vac split quadrivalent PF (FLUARIX) injection 0.5 mL    Sig:   . amoxicillin-clavulanate (AUGMENTIN) 875-125 MG per tablet    Sig: Take 1 tablet by mouth 2 (two) times daily.    Dispense:  20 tablet    Refill:  0  . escitalopram (LEXAPRO) 10 MG tablet    Sig: Take 1 tablet (10 mg total) by mouth at bedtime.    Dispense:  30 tablet    Refill:  5     Penni Homans, MD

## 2015-06-10 NOTE — Assessment & Plan Note (Signed)
Encouraged heart healthy diet, increase exercise, avoid trans fats, consider a krill oil cap daily 

## 2015-06-10 NOTE — Assessment & Plan Note (Signed)
Encouraged DASH diet, decrease po intake and increase exercise as tolerated. Needs 7-8 hours of sleep nightly. Avoid trans fats, eat small, frequent meals every 4-5 hours with lean proteins, complex carbs and healthy fats. Minimize simple carbs, GMO foods. 

## 2015-06-10 NOTE — Assessment & Plan Note (Signed)
On Levothyroxine, continue to monitor 

## 2015-08-15 DIAGNOSIS — J209 Acute bronchitis, unspecified: Secondary | ICD-10-CM

## 2015-08-15 HISTORY — DX: Acute bronchitis, unspecified: J20.9

## 2015-08-27 ENCOUNTER — Encounter: Payer: Self-pay | Admitting: Family Medicine

## 2015-08-27 ENCOUNTER — Ambulatory Visit (INDEPENDENT_AMBULATORY_CARE_PROVIDER_SITE_OTHER): Payer: Medicare Other | Admitting: Family Medicine

## 2015-08-27 VITALS — BP 128/80 | HR 78 | Temp 98.0°F | Ht 65.0 in | Wt 175.2 lb

## 2015-08-27 DIAGNOSIS — Z1159 Encounter for screening for other viral diseases: Secondary | ICD-10-CM | POA: Insufficient documentation

## 2015-08-27 DIAGNOSIS — Z23 Encounter for immunization: Secondary | ICD-10-CM

## 2015-08-27 DIAGNOSIS — M8580 Other specified disorders of bone density and structure, unspecified site: Secondary | ICD-10-CM

## 2015-08-27 DIAGNOSIS — R790 Abnormal level of blood mineral: Secondary | ICD-10-CM

## 2015-08-27 DIAGNOSIS — I159 Secondary hypertension, unspecified: Secondary | ICD-10-CM | POA: Insufficient documentation

## 2015-08-27 DIAGNOSIS — J209 Acute bronchitis, unspecified: Secondary | ICD-10-CM

## 2015-08-27 DIAGNOSIS — E785 Hyperlipidemia, unspecified: Secondary | ICD-10-CM | POA: Diagnosis not present

## 2015-08-27 DIAGNOSIS — E669 Obesity, unspecified: Secondary | ICD-10-CM | POA: Diagnosis not present

## 2015-08-27 DIAGNOSIS — E039 Hypothyroidism, unspecified: Secondary | ICD-10-CM

## 2015-08-27 DIAGNOSIS — G43009 Migraine without aura, not intractable, without status migrainosus: Secondary | ICD-10-CM

## 2015-08-27 DIAGNOSIS — Z0000 Encounter for general adult medical examination without abnormal findings: Secondary | ICD-10-CM | POA: Diagnosis not present

## 2015-08-27 HISTORY — DX: Abnormal level of blood mineral: R79.0

## 2015-08-27 HISTORY — DX: Encounter for general adult medical examination without abnormal findings: Z00.00

## 2015-08-27 MED ORDER — AMOXICILLIN 500 MG PO CAPS: 500.0000 mg | ORAL_CAPSULE | Freq: Three times a day (TID) | ORAL | Status: DC

## 2015-08-27 NOTE — Progress Notes (Signed)
Subjective:   [name] Nolan is a 69 y.o. female who presents for an Initial Medicare Annual Wellness Visit.  Review of Systems: No ROS Cardiac Risk Factors include: advanced age (>32men, >2 women) Sleep patterns:  Sleeps at least 8 hours per night/takes sleep aid Home Safety/Smoke Alarms:  Feels safe at home. Lives with husband.  Smoke alarms present.   Firearm Safety:  No firearms present.   Seat Belt Safety/Bike Helmet:  Always wears seat belt.    Counseling:   Eye Exam- 2015-norma; repeat in 1 year Dental- Goes ever 6 months.   Female:  Mammo-09/18/14      Dexa scan-09/18/14       [facility]- plans to do cologuard      Objective:    Today's Vitals   08/27/15 1344  BP: 128/80  Pulse: 78  Temp: 98 F (36.7 C)  TempSrc: Oral  Height: 5\' 5"  (1.651 m)  Weight: 175 lb 4 oz (79.493 kg)  SpO2: 95%  PainSc: 0-No pain    Current Medications (verified) Outpatient Encounter Prescriptions as of 08/27/2015  Medication Sig  . ALPRAZolam (XANAX) 1 MG tablet Take 1/2 or one whole tablet hs prn sleep  . amoxicillin (AMOXIL) 500 MG capsule Take 500 mg by mouth 2 (two) times daily.  . B Complex Vitamins (B-COMPLEX/B-12 PO) Take 2,500 mg by mouth.  . Calcium Carbonate 1500 (600 CA) MG TABS Take by mouth.  . cholecalciferol (VITAMIN D) 400 UNITS TABS tablet Take 1,000 Units by mouth.  . cyclobenzaprine (FLEXERIL) 10 MG tablet Take 1 tablet (10 mg total) by mouth at bedtime. Can also take 1/2 to one pill twice daily for headache.  . escitalopram (LEXAPRO) 10 MG tablet Take 1 tablet (10 mg total) by mouth at bedtime.  . ferrous sulfate 325 (65 FE) MG tablet Take 325 mg by mouth daily with breakfast.  . levothyroxine (SYNTHROID, LEVOTHROID) 75 MCG tablet Take 75 mcg by mouth daily before breakfast.  . magnesium gluconate (MAGONATE) 500 MG tablet Take 500 mg by mouth daily.   . Potassium 99 MG TABS Take by mouth daily.  . Probiotic Product ([place]) Take by mouth daily.  .  rizatriptan (MAXALT-MLT) 10 MG disintegrating tablet Take 1 tablet (10 mg total) by mouth as needed for migraine. May repeat in 2 hours if needed  . zonisamide (ZONEGRAN) 100 MG capsule Take 2 capsules (200 mg total) by mouth at bedtime.  . [DISCONTINUED] amoxicillin-clavulanate (AUGMENTIN) 875-125 MG per tablet Take 1 tablet by mouth 2 (two) times daily.  . [DISCONTINUED] pramipexole (MIRAPEX) 0.125 MG tablet 1 pill each night x 1 week, then 2 pills each night x 1 week, then 3 pills each night thereafter. Take 90-120 minutes before bedtime.   No facility-administered encounter medications on file as of 08/27/2015.    Allergies (verified) Codeine and Statins   History: Past Medical History  Diagnosis Date  . Hypothyroid   . Migraine   . Anxiety   . Occipital neuralgia   . Insomnia   . Esophageal reflux   . Prolonged depressive reaction   . Hyperlipidemia   . Menopause   . Arrhythmia 01/25/2015    Per Dr. Jaynee Eagles, [facility]  . Arthritis   . Fainting     fainted twice  . Dysphagia   . Osteopenia 02/26/2015  . Skin cancer 02/26/2015    Right ear Removed by Dr Syble Creek  . Medicare annual wellness visit, subsequent 08/27/2015  . Low ferritin 08/27/2015  Past Surgical History  Procedure Laterality Date  . Cholecystectomy    . Tubal ligation    . Tooth implant      at least 5 years ago per pt   Family History  Problem Relation Age of Onset  . Congestive Heart Failure Mother   . Leukemia Father   . Kidney disease Brother   . Cancer Brother     stage 4 kidney cancer  . Leukemia Maternal Aunt   . Congestive Heart Failure Maternal Grandmother    Social History   Occupational History  . Retired    Social History Main Topics  . Smoking status: Never Smoker   . Smokeless tobacco: Never Used  . Alcohol Use: No  . Drug Use: No  . Sexual Activity: Yes    Birth Control/ Protection: Post-menopausal     Comment: lives with husband, no dietary restrictions, avoids  caffeine, bananas, dairy    Tobacco Counseling Counseling given: Yes   Activities of Daily Living In your present state of health, do you have any difficulty performing the following activities: 08/27/2015 05/29/2015  Hearing? N N  Vision? N N  Difficulty concentrating or making decisions? N N  Walking or climbing stairs? N Y  Dressing or bathing? N N  Doing errands, shopping? N N  Preparing Food and eating ? N -  Using the Toilet? N -  In the past six months, have you accidently leaked urine? N -  Do you have problems with loss of bowel control? N -  Managing your Medications? N -  Managing your Finances? N -  Housekeeping or managing your Housekeeping? N -    Immunizations and Health Maintenance Immunization History  Administered Date(s) Administered  . Influenza,inj,Quad PF,36+ Mos 05/29/2015  . Influenza-Unspecified 05/16/2014  . Pneumococcal Conjugate-13 09/04/2014  . Tdap 08/24/2014  . Zoster 08/28/2014   Health Maintenance Due  Topic Date Due  . Hepatitis C Screening  06-24-1946  . COLON CANCER SCREENING ANNUAL FOBT  08/25/2015    Patient Care Team: Mosie Lukes, MD as PCP - General (Family Medicine) Melvenia Beam, MD as Consulting Physician (Neurology) Lelon Perla, MD as Consulting Physician (Cardiology) Delrae Rend, MD as Consulting Physician (Endocrinology) Lavonna Monarch, MD as Consulting Physician (Dermatology)  Indicate any recent Medical Services you may have received from other than [facility] providers in the past year (date may be approximate).     Assessment:   This is a routine wellness examination for [name] Nolan.   Hearing/Vision screen Hearing Screening Comments: Passed whisper hearing test Vision Screening Comments: Wears glasses follows with opthamology  Dietary issues and exercise activities discussed: Current Exercise Habits:: Home exercise routine (Walks dog 3 x per day. ), Type of exercise: walking, Time (Minutes): 60, Frequency  (Times/Week): 6, Weekly Exercise (Minutes/Week): 360   Diet:  Eats 2-3 meals per day. Breakfast- Special K cereal    Lunch-protein smoothie   Dinner-soup; sandwiches; fish   Goals    . Lose 10 lbs by next year.        Depression Screen PHQ 2/9 Scores 08/27/2015 05/29/2015 05/16/2014  PHQ - 2 Score 1 0 2  PHQ- 9 Score - - 4  Exception Documentation - Patient refusal -    Fall Risk Fall Risk  08/27/2015 05/29/2015 05/16/2014  Falls in the past year? - No No  Risk for fall due to : History of fall(s) History of fall(s) -    Cognitive Function: AD8 screening completed: 0/8   Screening  Tests Health Maintenance  Topic Date Due  . Hepatitis C Screening  1946-06-06  . COLON CANCER SCREENING ANNUAL FOBT  08/25/2015  . PNA vac Low Risk Adult (2 of 2 - PPSV23) 09/05/2015  . INFLUENZA VACCINE  04/15/2016  . MAMMOGRAM  09/18/2016  . TETANUS/TDAP  08/24/2024  . COLONOSCOPY  09/18/2024  . DEXA SCAN  Completed  . ZOSTAVAX  Addressed      Plan:  Follow plan given by Dr. Charlett Blake.    During the course of the visit, Teera was educated and counseled about the following appropriate screening and preventive services:   Vaccines to include Pneumoccal, Influenza, Hepatitis B, Td, Zostavax, HCV  Electrocardiogram  Cardiovascular disease screening  Colorectal cancer screening  Bone density screening  Diabetes screening  Glaucoma screening  Mammography/PAP  Nutrition counseling  Smoking cessation counseling  Patient Instructions (the written plan) were given to the patient.    Rudene Anda, RN   08/27/2015      RN AWV note reviewed. Agree with documention and plan.

## 2015-08-27 NOTE — Assessment & Plan Note (Signed)
Recommend calcium intake of 1200 to 1500 mg daily, divided into roughly 3 doses. Best source is the diet and a single dairy serving is about 500 mg, a supplement of calcium citrate once or twice daily to balance diet is fine if not getting enough in diet. Also need Vitamin D 2000 IU caps, 1 cap daily if not already taking vitamin D. Also recommend weight baring exercise on hips and upper body to keep bones strong 

## 2015-08-27 NOTE — Patient Instructions (Signed)
Encouraged increased rest and hydration, add probiotics, zinc such as Coldeze or Xicam. Treat fevers as needed. Elderberry liquid or tabs  Preventive Care for Adults, Female A healthy lifestyle and preventive care can promote health and wellness. Preventive health guidelines for women include the following key practices.  A routine yearly physical is a good way to check with your health care provider about your health and preventive screening. It is a chance to share any concerns and updates on your health and to receive a thorough exam.  Visit your dentist for a routine exam and preventive care every 6 months. Brush your teeth twice a day and floss once a day. Good oral hygiene prevents tooth decay and gum disease.  The frequency of eye exams is based on your age, health, family medical history, use of contact lenses, and other factors. Follow your health care provider's recommendations for frequency of eye exams.  Eat a healthy diet. Foods like vegetables, fruits, whole grains, low-fat dairy products, and lean protein foods contain the nutrients you need without too many calories. Decrease your intake of foods high in solid fats, added sugars, and salt. Eat the right amount of calories for you.Get information about a proper diet from your health care provider, if necessary.  Regular physical exercise is one of the most important things you can do for your health. Most adults should get at least 150 minutes of moderate-intensity exercise (any activity that increases your heart rate and causes you to sweat) each week. In addition, most adults need muscle-strengthening exercises on 2 or more days a week.  Maintain a healthy weight. The body mass index (BMI) is a screening tool to identify possible weight problems. It provides an estimate of body fat based on height and weight. Your health care provider can find your BMI and can help you achieve or maintain a healthy weight.For adults 20 years and  older:  A BMI below 18.5 is considered underweight.  A BMI of 18.5 to 24.9 is normal.  A BMI of 25 to 29.9 is considered overweight.  A BMI of 30 and above is considered obese.  Maintain normal blood lipids and cholesterol levels by exercising and minimizing your intake of saturated fat. Eat a balanced diet with plenty of fruit and vegetables. Blood tests for lipids and cholesterol should begin at age 4 and be repeated every 5 years. If your lipid or cholesterol levels are high, you are over 50, or you are at high risk for heart disease, you may need your cholesterol levels checked more frequently.Ongoing high lipid and cholesterol levels should be treated with medicines if diet and exercise are not working.  If you smoke, find out from your health care provider how to quit. If you do not use tobacco, do not start.  Lung cancer screening is recommended for adults aged 17-80 years who are at high risk for developing lung cancer because of a history of smoking. A yearly low-dose CT scan of the lungs is recommended for people who have at least a 30-pack-year history of smoking and are a current smoker or have quit within the past 15 years. A pack year of smoking is smoking an average of 1 pack of cigarettes a day for 1 year (for example: 1 pack a day for 30 years or 2 packs a day for 15 years). Yearly screening should continue until the smoker has stopped smoking for at least 15 years. Yearly screening should be stopped for people who develop a health  problem that would prevent them from having lung cancer treatment.  If you are pregnant, do not drink alcohol. If you are breastfeeding, be very cautious about drinking alcohol. If you are not pregnant and choose to drink alcohol, do not have more than 1 drink per day. One drink is considered to be 12 ounces (355 mL) of beer, 5 ounces (148 mL) of wine, or 1.5 ounces (44 mL) of liquor.  Avoid use of street drugs. Do not share needles with anyone. Ask  for help if you need support or instructions about stopping the use of drugs.  High blood pressure causes heart disease and increases the risk of stroke. Your blood pressure should be checked at least every 1 to 2 years. Ongoing high blood pressure should be treated with medicines if weight loss and exercise do not work.  If you are 38-49 years old, ask your health care provider if you should take aspirin to prevent strokes.  Diabetes screening is done by taking a blood sample to check your blood glucose level after you have not eaten for a certain period of time (fasting). If you are not overweight and you do not have risk factors for diabetes, you should be screened once every 3 years starting at age 37. If you are overweight or obese and you are 77-58 years of age, you should be screened for diabetes every year as part of your cardiovascular risk assessment.  Breast cancer screening is essential preventive care for women. You should practice "breast self-awareness." This means understanding the normal appearance and feel of your breasts and may include breast self-examination. Any changes detected, no matter how small, should be reported to a health care provider. Women in their 38s and 30s should have a clinical breast exam (CBE) by a health care provider as part of a regular health exam every 1 to 3 years. After age 36, women should have a CBE every year. Starting at age 52, women should consider having a mammogram (breast X-ray test) every year. Women who have a family history of breast cancer should talk to their health care provider about genetic screening. Women at a high risk of breast cancer should talk to their health care providers about having an MRI and a mammogram every year.  Breast cancer gene (BRCA)-related cancer risk assessment is recommended for women who have family members with BRCA-related cancers. BRCA-related cancers include breast, ovarian, tubal, and peritoneal cancers. Having  family members with these cancers may be associated with an increased risk for harmful changes (mutations) in the breast cancer genes BRCA1 and BRCA2. Results of the assessment will determine the need for genetic counseling and BRCA1 and BRCA2 testing.  Your health care provider may recommend that you be screened regularly for cancer of the pelvic organs (ovaries, uterus, and vagina). This screening involves a pelvic examination, including checking for microscopic changes to the surface of your cervix (Pap test). You may be encouraged to have this screening done every 3 years, beginning at age 63.  For women ages 63-65, health care providers may recommend pelvic exams and Pap testing every 3 years, or they may recommend the Pap and pelvic exam, combined with testing for human papilloma virus (HPV), every 5 years. Some types of HPV increase your risk of cervical cancer. Testing for HPV may also be done on women of any age with unclear Pap test results.  Other health care providers may not recommend any screening for nonpregnant women who are considered low risk  for pelvic cancer and who do not have symptoms. Ask your health care provider if a screening pelvic exam is right for you.  If you have had past treatment for cervical cancer or a condition that could lead to cancer, you need Pap tests and screening for cancer for at least 20 years after your treatment. If Pap tests have been discontinued, your risk factors (such as having a new sexual partner) need to be reassessed to determine if screening should resume. Some women have medical problems that increase the chance of getting cervical cancer. In these cases, your health care provider may recommend more frequent screening and Pap tests.  Colorectal cancer can be detected and often prevented. Most routine colorectal cancer screening begins at the age of 45 years and continues through age 47 years. However, your health care provider may recommend  screening at an earlier age if you have risk factors for colon cancer. On a yearly basis, your health care provider may provide home test kits to check for hidden blood in the stool. Use of a small camera at the end of a tube, to directly examine the colon (sigmoidoscopy or colonoscopy), can detect the earliest forms of colorectal cancer. Talk to your health care provider about this at age 68, when routine screening begins. Direct exam of the colon should be repeated every 5-10 years through age 57 years, unless early forms of precancerous polyps or small growths are found.  People who are at an increased risk for hepatitis B should be screened for this virus. You are considered at high risk for hepatitis B if:  You were born in a country where hepatitis B occurs often. Talk with your health care provider about which countries are considered high risk.  Your parents were born in a high-risk country and you have not received a shot to protect against hepatitis B (hepatitis B vaccine).  You have HIV or AIDS.  You use needles to inject street drugs.  You live with, or have sex with, someone who has hepatitis B.  You get hemodialysis treatment.  You take certain medicines for conditions like cancer, organ transplantation, and autoimmune conditions.  Hepatitis C blood testing is recommended for all people born from [birth year] through 1965 and any individual with known risks for hepatitis C.  Practice safe sex. Use condoms and avoid high-risk sexual practices to reduce the spread of sexually transmitted infections (STIs). STIs include gonorrhea, chlamydia, syphilis, trichomonas, herpes, HPV, and human immunodeficiency virus (HIV). Herpes, HIV, and HPV are viral illnesses that have no cure. They can result in disability, cancer, and death.  You should be screened for sexually transmitted illnesses (STIs) including gonorrhea and chlamydia if:  You are sexually active and are younger than 24 years.  You  are older than 24 years and your health care provider tells you that you are at risk for this type of infection.  Your sexual activity has changed since you were last screened and you are at an increased risk for chlamydia or gonorrhea. Ask your health care provider if you are at risk.  If you are at risk of being infected with HIV, it is recommended that you take a prescription medicine daily to prevent HIV infection. This is called preexposure prophylaxis (PrEP). You are considered at risk if:  You are sexually active and do not regularly use condoms or know the HIV status of your partner(s).  You take drugs by injection.  You are sexually active with a partner who  has HIV.  Talk with your health care provider about whether you are at high risk of being infected with HIV. If you choose to begin PrEP, you should first be tested for HIV. You should then be tested every 3 months for as long as you are taking PrEP.  Osteoporosis is a disease in which the bones lose minerals and strength with aging. This can result in serious bone fractures or breaks. The risk of osteoporosis can be identified using a bone density scan. Women ages 9 years and over and women at risk for fractures or osteoporosis should discuss screening with their health care providers. Ask your health care provider whether you should take a calcium supplement or vitamin D to reduce the rate of osteoporosis.  Menopause can be associated with physical symptoms and risks. Hormone replacement therapy is available to decrease symptoms and risks. You should talk to your health care provider about whether hormone replacement therapy is right for you.  Use sunscreen. Apply sunscreen liberally and repeatedly throughout the day. You should seek shade when your shadow is shorter than you. Protect yourself by wearing long sleeves, pants, a wide-brimmed hat, and sunglasses year round, whenever you are outdoors.  Once a month, do a whole body  skin exam, using a mirror to look at the skin on your back. Tell your health care provider of new moles, moles that have irregular borders, moles that are larger than a pencil eraser, or moles that have changed in shape or color.  Stay current with required vaccines (immunizations).  Influenza vaccine. All adults should be immunized every year.  Tetanus, diphtheria, and acellular pertussis (Td, Tdap) vaccine. Pregnant women should receive 1 dose of Tdap vaccine during each pregnancy. The dose should be obtained regardless of the length of time since the last dose. Immunization is preferred during the 27th-36th week of gestation. An adult who has not previously received Tdap or who does not know her vaccine status should receive 1 dose of Tdap. This initial dose should be followed by tetanus and diphtheria toxoids (Td) booster doses every 10 years. Adults with an unknown or incomplete history of completing a 3-dose immunization series with Td-containing vaccines should begin or complete a primary immunization series including a Tdap dose. Adults should receive a Td booster every 10 years.  Varicella vaccine. An adult without evidence of immunity to varicella should receive 2 doses or a second dose if she has previously received 1 dose. Pregnant females who do not have evidence of immunity should receive the first dose after pregnancy. This first dose should be obtained before leaving the health care facility. The second dose should be obtained 4-8 weeks after the first dose.  Human papillomavirus (HPV) vaccine. Females aged 13-26 years who have not received the vaccine previously should obtain the 3-dose series. The vaccine is not recommended for use in pregnant females. However, pregnancy testing is not needed before receiving a dose. If a female is found to be pregnant after receiving a dose, no treatment is needed. In that case, the remaining doses should be delayed until after the pregnancy.  Immunization is recommended for any person with an immunocompromised condition through the age of 77 years if she did not get any or all doses earlier. During the 3-dose series, the second dose should be obtained 4-8 weeks after the first dose. The third dose should be obtained 24 weeks after the first dose and 16 weeks after the second dose.  Zoster vaccine. One dose is  recommended for adults aged 42 years or older unless certain conditions are present.  Measles, mumps, and rubella (MMR) vaccine. Adults born before [birth year] generally are considered immune to measles and mumps. Adults born in [birth year] or later should have 1 or more doses of MMR vaccine unless there is a contraindication to the vaccine or there is laboratory evidence of immunity to each of the three diseases. A routine second dose of MMR vaccine should be obtained at least 28 days after the first dose for students attending postsecondary schools, health care workers, or international travelers. People who received inactivated measles vaccine or an unknown type of measles vaccine during 1963-1967 should receive 2 doses of MMR vaccine. People who received inactivated mumps vaccine or an unknown type of mumps vaccine before 1979 and are at high risk for mumps infection should consider immunization with 2 doses of MMR vaccine. For females of childbearing age, rubella immunity should be determined. If there is no evidence of immunity, females who are not pregnant should be vaccinated. If there is no evidence of immunity, females who are pregnant should delay immunization until after pregnancy. Unvaccinated health care workers born before [birth year] who lack laboratory evidence of measles, mumps, or rubella immunity or laboratory confirmation of disease should consider measles and mumps immunization with 2 doses of MMR vaccine or rubella immunization with 1 dose of MMR vaccine.  Pneumococcal 13-valent conjugate (PCV13) vaccine. When indicated, a person who is  uncertain of his immunization history and has no record of immunization should receive the PCV13 vaccine. All adults 31 years of age and older should receive this vaccine. An adult aged 18 years or older who has certain medical conditions and has not been previously immunized should receive 1 dose of PCV13 vaccine. This PCV13 should be followed with a dose of pneumococcal polysaccharide (PPSV23) vaccine. Adults who are at high risk for pneumococcal disease should obtain the PPSV23 vaccine at least 8 weeks after the dose of PCV13 vaccine. Adults older than 67 years of age who have normal immune system function should obtain the PPSV23 vaccine dose at least 1 year after the dose of PCV13 vaccine.  Pneumococcal polysaccharide (PPSV23) vaccine. When PCV13 is also indicated, PCV13 should be obtained first. All adults aged 69 years and older should be immunized. An adult younger than age 64 years who has certain medical conditions should be immunized. Any person who resides in a nursing home or long-term care facility should be immunized. An adult smoker should be immunized. People with an immunocompromised condition and certain other conditions should receive both PCV13 and PPSV23 vaccines. People with human immunodeficiency virus (HIV) infection should be immunized as soon as possible after diagnosis. Immunization during chemotherapy or radiation therapy should be avoided. Routine use of PPSV23 vaccine is not recommended for American Indians, [place]atives, or people younger than 65 years unless there are medical conditions that require PPSV23 vaccine. When indicated, people who have unknown immunization and have no record of immunization should receive PPSV23 vaccine. One-time revaccination 5 years after the first dose of PPSV23 is recommended for people aged 19-64 years who have chronic kidney failure, nephrotic syndrome, asplenia, or immunocompromised conditions. People who received 1-2 doses of PPSV23 before age  92 years should receive another dose of PPSV23 vaccine at age 56 years or later if at least 5 years have passed since the previous dose. Doses of PPSV23 are not needed for people immunized with PPSV23 at or after age 16 years.  Meningococcal vaccine. Adults  with asplenia or persistent complement component deficiencies should receive 2 doses of quadrivalent meningococcal conjugate (MenACWY-D) vaccine. The doses should be obtained at least 2 months apart. Microbiologists working with certain meningococcal bacteria, [employer] recruits, people at risk during an outbreak, and people who travel to or live in countries with a high rate of meningitis should be immunized. A first-year college student up through age 30 years who is living in a residence hall should receive a dose if she did not receive a dose on or after her 16th birthday. Adults who have certain high-risk conditions should receive one or more doses of vaccine.  Hepatitis A vaccine. Adults who wish to be protected from this disease, have certain high-risk conditions, work with hepatitis A-infected animals, work in hepatitis A research labs, or travel to or work in countries with a high rate of hepatitis A should be immunized. Adults who were previously unvaccinated and who anticipate close contact with an international adoptee during the first 60 days after arrival in the [place] from a country with a high rate of hepatitis A should be immunized.  Hepatitis B vaccine. Adults who wish to be protected from this disease, have certain high-risk conditions, may be exposed to blood or other infectious body fluids, are household contacts or sex partners of hepatitis B positive people, are clients or workers in certain care facilities, or travel to or work in countries with a high rate of hepatitis B should be immunized.  Haemophilus influenzae type b (Hib) vaccine. A previously unvaccinated person with asplenia or sickle cell disease or having a  scheduled splenectomy should receive 1 dose of Hib vaccine. Regardless of previous immunization, a recipient of a hematopoietic stem cell transplant should receive a 3-dose series 6-12 months after her successful transplant. Hib vaccine is not recommended for adults with HIV infection. Preventive Services / Frequency Ages 51 to 33 years  Blood pressure check.** / Every 3-5 years.  Lipid and cholesterol check.** / Every 5 years beginning at age 37.  Clinical breast exam.** / Every 3 years for women in their 64s and 10s.  BRCA-related cancer risk assessment.** / For women who have family members with a BRCA-related cancer (breast, ovarian, tubal, or peritoneal cancers).  Pap test.** / Every 2 years from ages 55 through 49. Every 3 years starting at age 81 through age 49 or 27 with a history of 3 consecutive normal Pap tests.  HPV screening.** / Every 3 years from ages 64 through ages 94 to 64 with a history of 3 consecutive normal Pap tests.  Hepatitis C blood test.** / For any individual with known risks for hepatitis C.  Skin self-exam. / Monthly.  Influenza vaccine. / Every year.  Tetanus, diphtheria, and acellular pertussis (Tdap, Td) vaccine.** / Consult your health care provider. Pregnant women should receive 1 dose of Tdap vaccine during each pregnancy. 1 dose of Td every 10 years.  Varicella vaccine.** / Consult your health care provider. Pregnant females who do not have evidence of immunity should receive the first dose after pregnancy.  HPV vaccine. / 3 doses over 6 months, if 34 and younger. The vaccine is not recommended for use in pregnant females. However, pregnancy testing is not needed before receiving a dose.  Measles, mumps, rubella (MMR) vaccine.** / You need at least 1 dose of MMR if you were born in 1957 or later. You may also need a 2nd dose. For females of childbearing age, rubella immunity should be determined. If there is  no evidence of immunity, females who are not  pregnant should be vaccinated. If there is no evidence of immunity, females who are pregnant should delay immunization until after pregnancy.  Pneumococcal 13-valent conjugate (PCV13) vaccine.** / Consult your health care provider.  Pneumococcal polysaccharide (PPSV23) vaccine.** / 1 to 2 doses if you smoke cigarettes or if you have certain conditions.  Meningococcal vaccine.** / 1 dose if you are age 48 to 24 years and a Market researcher living in a residence hall, or have one of several medical conditions, you need to get vaccinated against meningococcal disease. You may also need additional booster doses.  Hepatitis A vaccine.** / Consult your health care provider.  Hepatitis B vaccine.** / Consult your health care provider.  Haemophilus influenzae type b (Hib) vaccine.** / Consult your health care provider. Ages 46 to 50 years  Blood pressure check.** / Every year.  Lipid and cholesterol check.** / Every 5 years beginning at age 64 years.  Lung cancer screening. / Every year if you are aged 75-80 years and have a 30-pack-year history of smoking and currently smoke or have quit within the past 15 years. Yearly screening is stopped once you have quit smoking for at least 15 years or develop a health problem that would prevent you from having lung cancer treatment.  Clinical breast exam.** / Every year after age 28 years.  BRCA-related cancer risk assessment.** / For women who have family members with a BRCA-related cancer (breast, ovarian, tubal, or peritoneal cancers).  Mammogram.** / Every year beginning at age 28 years and continuing for as long as you are in good health. Consult with your health care provider.  Pap test.** / Every 3 years starting at age 39 years through age 17 or 32 years with a history of 3 consecutive normal Pap tests.  HPV screening.** / Every 3 years from ages 61 years through ages 2 to 48 years with a history of 3 consecutive normal Pap  tests.  Fecal occult blood test (FOBT) of stool. / Every year beginning at age 43 years and continuing until age 14 years. You may not need to do this test if you get a colonoscopy every 10 years.  Flexible sigmoidoscopy or colonoscopy.** / Every 5 years for a flexible sigmoidoscopy or every 10 years for a colonoscopy beginning at age 68 years and continuing until age 38 years.  Hepatitis C blood test.** / For all people born from [birth year] through 1963 and any individual with known risks for hepatitis C.  Skin self-exam. / Monthly.  Influenza vaccine. / Every year.  Tetanus, diphtheria, and acellular pertussis (Tdap/Td) vaccine.** / Consult your health care provider. Pregnant women should receive 1 dose of Tdap vaccine during each pregnancy. 1 dose of Td every 10 years.  Varicella vaccine.** / Consult your health care provider. Pregnant females who do not have evidence of immunity should receive the first dose after pregnancy.  Zoster vaccine.** / 1 dose for adults aged 72 years or older.  Measles, mumps, rubella (MMR) vaccine.** / You need at least 1 dose of MMR if you were born in 1957 or later. You may also need a second dose. For females of childbearing age, rubella immunity should be determined. If there is no evidence of immunity, females who are not pregnant should be vaccinated. If there is no evidence of immunity, females who are pregnant should delay immunization until after pregnancy.  Pneumococcal 13-valent conjugate (PCV13) vaccine.** / Consult your health care provider.  Pneumococcal  polysaccharide (PPSV23) vaccine.** / 1 to 2 doses if you smoke cigarettes or if you have certain conditions.  Meningococcal vaccine.** / Consult your health care provider.  Hepatitis A vaccine.** / Consult your health care provider.  Hepatitis B vaccine.** / Consult your health care provider.  Haemophilus influenzae type b (Hib) vaccine.** / Consult your health care provider. Ages 78 years and  over  Blood pressure check.** / Every year.  Lipid and cholesterol check.** / Every 5 years beginning at age 24 years.  Lung cancer screening. / Every year if you are aged 87-80 years and have a 30-pack-year history of smoking and currently smoke or have quit within the past 15 years. Yearly screening is stopped once you have quit smoking for at least 15 years or develop a health problem that would prevent you from having lung cancer treatment.  Clinical breast exam.** / Every year after age 34 years.  BRCA-related cancer risk assessment.** / For women who have family members with a BRCA-related cancer (breast, ovarian, tubal, or peritoneal cancers).  Mammogram.** / Every year beginning at age 59 years and continuing for as long as you are in good health. Consult with your health care provider.  Pap test.** / Every 3 years starting at age 76 years through age 72 or 5 years with 3 consecutive normal Pap tests. Testing can be stopped between 65 and 70 years with 3 consecutive normal Pap tests and no abnormal Pap or HPV tests in the past 10 years.  HPV screening.** / Every 3 years from ages 68 years through ages 51 or 56 years with a history of 3 consecutive normal Pap tests. Testing can be stopped between 65 and 70 years with 3 consecutive normal Pap tests and no abnormal Pap or HPV tests in the past 10 years.  Fecal occult blood test (FOBT) of stool. / Every year beginning at age 75 years and continuing until age 62 years. You may not need to do this test if you get a colonoscopy every 10 years.  Flexible sigmoidoscopy or colonoscopy.** / Every 5 years for a flexible sigmoidoscopy or every 10 years for a colonoscopy beginning at age 59 years and continuing until age 33 years.  Hepatitis C blood test.** / For all people born from [birth year] through 1963 and any individual with known risks for hepatitis C.  Osteoporosis screening.** / A one-time screening for women ages 58 years and over and women  at risk for fractures or osteoporosis.  Skin self-exam. / Monthly.  Influenza vaccine. / Every year.  Tetanus, diphtheria, and acellular pertussis (Tdap/Td) vaccine.** / 1 dose of Td every 10 years.  Varicella vaccine.** / Consult your health care provider.  Zoster vaccine.** / 1 dose for adults aged 96 years or older.  Pneumococcal 13-valent conjugate (PCV13) vaccine.** / Consult your health care provider.  Pneumococcal polysaccharide (PPSV23) vaccine.** / 1 dose for all adults aged 59 years and older.  Meningococcal vaccine.** / Consult your health care provider.  Hepatitis A vaccine.** / Consult your health care provider.  Hepatitis B vaccine.** / Consult your health care provider.  Haemophilus influenzae type b (Hib) vaccine.** / Consult your health care provider. ** Family history and personal history of risk and conditions may change your health care provider's recommendations.   This information is not intended to replace advice given to you by your health care provider. Make sure you discuss any questions you have with your health care provider.   Document Released: 10/28/2001 Document Revised: 09/22/2014  Document Reviewed: 01/27/2011 Elsevier Interactive Patient Education [employer].

## 2015-08-27 NOTE — Assessment & Plan Note (Signed)
On Levothyroxine, continue to monitor 

## 2015-08-27 NOTE — Assessment & Plan Note (Signed)
Encouraged DASH diet, decrease po intake and increase exercise as tolerated. Needs 7-8 hours of sleep nightly. Avoid trans fats, eat small, frequent meals every 4-5 hours with lean proteins, complex carbs and healthy fats. Minimize simple carbs 

## 2015-08-27 NOTE — Assessment & Plan Note (Signed)
>>  ASSESSMENT AND PLAN FOR OBESITY WRITTEN ON 08/27/2015  2:22 PM BY BLYTH, STACEY A, MD  Encouraged DASH diet, decrease po intake and increase exercise as tolerated. Needs 7-8 hours of sleep nightly. Avoid trans fats, eat small, frequent meals every 4-5 hours with lean proteins, complex carbs and healthy fats. Minimize simple carbs

## 2015-08-27 NOTE — Progress Notes (Signed)
Pre visit review using our clinic review tool, if applicable. No additional management support is needed unless otherwise documented below in the visit note. 

## 2015-08-27 NOTE — Assessment & Plan Note (Signed)
Encouraged heart healthy diet, increase exercise, avoid trans fats, consider a krill oil cap daily 

## 2015-08-27 NOTE — Assessment & Plan Note (Signed)
Follows with Dr Jaynee Eagles at [facility], still having daily headaches, will return to see neurology and continue current meds

## 2015-08-27 NOTE — Assessment & Plan Note (Signed)
>>  ASSESSMENT AND PLAN FOR HYPOTHYROIDISM WRITTEN ON 08/27/2015  2:17 PM BY BLYTH, STACEY A, MD  On Levothyroxine, continue to monitor

## 2015-08-27 NOTE — Assessment & Plan Note (Signed)
Patient denies any difficulties at home. No trouble with ADLs, depression or falls. See EMR for functional status screen and depression screen. No recent changes to vision or hearing. Is UTD with immunizations. Is UTD with screening. Discussed Advanced Directives. Encouraged heart healthy diet, exercise as tolerated and adequate sleep. See patient's problem list for health risk factors to monitor. See AVS for preventative healthcare recommendation schedule. 

## 2015-08-27 NOTE — Progress Notes (Signed)
Subjective:    Patient ID: [name] Nolan, female    DOB: Jun 11, 1946, 69 y.o.   MRN: [id number]  Chief Complaint  Patient presents with  . Annual Exam    HPI Patient is in today for annual exam. She is complaining of respiratory symptoms. She has had chest and head congestion. She endorses postnasal drip, and clear rhinorrhea. She has malaise and myalgias. She has chest pain and back pain especially with coughing. No obvious fevers or chills. She has tried SELTZER at night and DayQuil during the day with marginal relief. Prior to this acute illness she had been doing fairly well. She endorses headaches frequently at night however. Has used ibuprofen and Maxalt when necessary with good results. Continues to follow with Dr. Farrel Conners endocrinology and Dr. Marge Duncans of dermatology. Denies CP/palp/SOB/fevers/GI or GU c/o. Taking meds as prescribed  Past Medical History  Diagnosis Date  . Hypothyroid   . Migraine   . Anxiety   . Occipital neuralgia   . Insomnia   . Esophageal reflux   . Prolonged depressive reaction   . Hyperlipidemia   . Menopause   . Arrhythmia 01/25/2015    Per Dr. Jaynee Eagles, [facility]  . Arthritis   . Fainting     fainted twice  . Dysphagia   . Osteopenia 02/26/2015  . Skin cancer 02/26/2015    Right ear Removed by Dr [place]    Past Surgical History  Procedure Laterality Date  . Cholecystectomy    . Tubal ligation    . Tooth implant      at least 5 years ago per pt    Family History  Problem Relation Age of Onset  . Congestive Heart Failure Mother   . Leukemia Father   . Kidney disease Brother   . Cancer Brother     stage 4 kidney cancer  . Leukemia Maternal Aunt   . Congestive Heart Failure Maternal Grandmother     Social History   Social History  . Marital Status: Married    Spouse Name: [name]  . Number of Children: 1  . Years of Education: HS   Occupational History  . Retired    Social History Main Topics  . Smoking status:  Never Smoker   . Smokeless tobacco: Never Used  . Alcohol Use: No  . Drug Use: No  . Sexual Activity: Yes    Birth Control/ Protection: Post-menopausal     Comment: lives with husband, no dietary restrictions, avoids caffeine, bananas, dairy   Other Topics Concern  . Not on file   Social History Narrative   Patient is married [name]) and lives at home with her husband.   Patient has one child.   Patient has a high school education.   Patient is right-handed.   Caffeine Use: Occasionally    Outpatient Prescriptions Prior to Visit  Medication Sig Dispense Refill  . ALPRAZolam (XANAX) 1 MG tablet Take 1/2 or one whole tablet hs prn sleep 30 tablet 4  . B Complex Vitamins (B-COMPLEX/B-12 PO) Take 2,500 mg by mouth.    . Calcium Carbonate 1500 (600 CA) MG TABS Take by mouth.    . cholecalciferol (VITAMIN D) 400 UNITS TABS tablet Take 1,000 Units by mouth.    . cyclobenzaprine (FLEXERIL) 10 MG tablet Take 1 tablet (10 mg total) by mouth at bedtime. Can also take 1/2 to one pill twice daily for headache. 90 tablet 11  . escitalopram (LEXAPRO) 10 MG tablet Take 1 tablet (10 mg  total) by mouth at bedtime. 30 tablet 5  . ferrous sulfate 325 (65 FE) MG tablet Take 325 mg by mouth daily with breakfast.    . levothyroxine (SYNTHROID, LEVOTHROID) 75 MCG tablet Take 75 mcg by mouth daily before breakfast.    . magnesium gluconate (MAGONATE) 500 MG tablet Take 500 mg by mouth daily.     . rizatriptan (MAXALT-MLT) 10 MG disintegrating tablet Take 1 tablet (10 mg total) by mouth as needed for migraine. May repeat in 2 hours if needed 15 tablet 11  . zonisamide (ZONEGRAN) 100 MG capsule Take 2 capsules (200 mg total) by mouth at bedtime. 60 capsule 11  . amoxicillin-clavulanate (AUGMENTIN) 875-125 MG per tablet Take 1 tablet by mouth 2 (two) times daily. 20 tablet 0  . pramipexole (MIRAPEX) 0.125 MG tablet 1 pill each night x 1 week, then 2 pills each night x 1 week, then 3 pills each night  thereafter. Take 90-120 minutes before bedtime. 90 tablet 5   No facility-administered medications prior to visit.    Allergies  Allergen Reactions  . Codeine   . Statins     intolerance    Review of Systems  Constitutional: Negative for fever, chills and malaise/fatigue.  HENT: Positive for congestion. Negative for hearing loss.   Eyes: Negative for discharge.  Respiratory: Positive for cough and sputum production. Negative for shortness of breath.   Cardiovascular: Negative for chest pain, palpitations and leg swelling.  Gastrointestinal: Negative for heartburn, nausea, vomiting, abdominal pain, diarrhea, constipation and blood in stool.  Genitourinary: Negative for dysuria, urgency, frequency and hematuria.  Musculoskeletal: Negative for myalgias, back pain and falls.  Skin: Negative for rash.  Neurological: Positive for headaches. Negative for dizziness, sensory change, loss of consciousness and weakness.  Endo/Heme/Allergies: Negative for environmental allergies. Does not bruise/bleed easily.  Psychiatric/Behavioral: Negative for depression and suicidal ideas. The patient is not nervous/anxious and does not have insomnia.        Objective:    Physical Exam  Constitutional: She is oriented to person, place, and time. She appears well-developed and well-nourished. No distress.  HENT:  Head: Normocephalic and atraumatic.  Nose: Nose normal.  Nasal mucosa boggy and erythematous  Eyes: Right eye exhibits no discharge. Left eye exhibits no discharge.  Neck: Normal range of motion. Neck supple.  Cardiovascular: Normal rate and regular rhythm.   No murmur heard. Pulmonary/Chest: Effort normal and breath sounds normal.  Abdominal: Soft. Bowel sounds are normal. There is no tenderness.  Musculoskeletal: She exhibits no edema.  Neurological: She is alert and oriented to person, place, and time.  Skin: Skin is warm and dry.  Psychiatric: She has a normal mood and affect.    Nursing note and vitals reviewed.   BP 128/80 mmHg  Pulse 78  Temp(Src) 98 F (36.7 C) (Oral)  Ht 5\' 5"  (1.651 m)  Wt 175 lb 4 oz (79.493 kg)  BMI 29.16 kg/m2  SpO2 95% Wt Readings from Last 3 Encounters:  08/27/15 175 lb 4 oz (79.493 kg)  05/29/15 180 lb 12.8 oz (82.01 kg)  04/25/15 178 lb 12.8 oz (81.103 kg)     Lab Results  Component Value Date   WBC 4.6 04/25/2015   HGB 12.4 02/26/2015   HCT 37.9 04/25/2015   PLT 274.0 02/26/2015   GLUCOSE 71 02/26/2015   CHOL 197 02/26/2015   TRIG 88.0 02/26/2015   HDL 52.90 02/26/2015   LDLCALC 127* 02/26/2015   ALT 15 02/26/2015   AST 26 02/26/2015  NA 137 02/26/2015   K 3.8 02/26/2015   CL 104 02/26/2015   CREATININE 0.99 02/26/2015   BUN 19 02/26/2015   CO2 26 02/26/2015   TSH 2.58 02/26/2015    Lab Results  Component Value Date   TSH 2.58 02/26/2015   Lab Results  Component Value Date   WBC 4.6 04/25/2015   HGB 12.4 02/26/2015   HCT 37.9 04/25/2015   MCV 83.8 02/26/2015   PLT 274.0 02/26/2015   Lab Results  Component Value Date   NA 137 02/26/2015   K 3.8 02/26/2015   CO2 26 02/26/2015   GLUCOSE 71 02/26/2015   BUN 19 02/26/2015   CREATININE 0.99 02/26/2015   BILITOT 0.5 02/26/2015   ALKPHOS 70 02/26/2015   AST 26 02/26/2015   ALT 15 02/26/2015   PROT 7.1 02/26/2015   ALBUMIN 4.3 02/26/2015   CALCIUM 9.9 02/26/2015   ANIONGAP 11 05/04/2014   GFR 59.39* 02/26/2015   Lab Results  Component Value Date   CHOL 197 02/26/2015   Lab Results  Component Value Date   HDL 52.90 02/26/2015   Lab Results  Component Value Date   LDLCALC 127* 02/26/2015   Lab Results  Component Value Date   TRIG 88.0 02/26/2015   Lab Results  Component Value Date   CHOLHDL 4 02/26/2015   No results found for: HGBA1C     Assessment & Plan:   Problem List Items Addressed This Visit    None      I have discontinued Ms. Stovall-Edwards's pramipexole and amoxicillin-clavulanate. I am also having her maintain  her levothyroxine, magnesium gluconate, cyclobenzaprine, rizatriptan, ALPRAZolam, zonisamide, B Complex Vitamins (B-COMPLEX/B-12 PO), cholecalciferol, ferrous sulfate, calcium carbonate, escitalopram, Potassium, and Probiotic Product (Central).  Meds ordered this encounter  Medications  . Potassium 99 MG TABS    Sig: Take by mouth daily.  . Probiotic Product ([facility] COLON HEALTH PO)    Sig: Take by mouth daily.     Penni Homans, MD

## 2015-08-27 NOTE — Assessment & Plan Note (Signed)
>>  ASSESSMENT AND PLAN FOR MIGRAINE WITHOUT AURA WRITTEN ON 08/27/2015  2:23 PM BY Bradd Canary, MD  Follows with Dr Lucia Gaskins at [facility], still having daily headaches, will return to see neurology and continue current meds

## 2015-08-28 LAB — COMPREHENSIVE METABOLIC PANEL
ALBUMIN: 4.1 g/dL (ref 3.5–5.2)
ALT: 18 U/L (ref 0–35)
AST: 25 U/L (ref 0–37)
Alkaline Phosphatase: 80 U/L (ref 39–117)
BUN: 12 mg/dL (ref 6–23)
CHLORIDE: 106 meq/L (ref 96–112)
CO2: 29 mEq/L (ref 19–32)
CREATININE: 1.02 mg/dL (ref 0.40–1.20)
Calcium: 9.9 mg/dL (ref 8.4–10.5)
GFR: 57.29 mL/min — ABNORMAL LOW (ref >60.00)
GLUCOSE: 78 mg/dL (ref 70–99)
Potassium: 4.1 mEq/L (ref 3.5–5.1)
SODIUM: 142 meq/L (ref 135–145)
TOTAL PROTEIN: 6.9 g/dL (ref 6.0–8.3)
Total Bilirubin: 0.5 mg/dL (ref 0.2–1.2)

## 2015-08-28 LAB — CBC
HCT: 41.8 % (ref 36.0–46.0)
Hemoglobin: 13.9 g/dL (ref 12.0–15.0)
MCHC: 33.1 g/dL (ref 30.0–36.0)
MCV: 92.7 fl (ref 78.0–100.0)
Platelets: 249 10*3/uL (ref 150.0–400.0)
RBC: 4.51 Mil/uL (ref 3.87–5.11)
RDW: 13.7 % (ref 11.5–15.5)
WBC: 4.6 10*3/uL (ref 4.0–10.5)

## 2015-08-28 LAB — LIPID PANEL
CHOLESTEROL: 218 mg/dL — AB (ref 0–200)
HDL: 48.5 mg/dL (ref >39.00)
LDL CALC: 141 mg/dL — AB (ref 0–99)
NonHDL: 169.47
Total CHOL/HDL Ratio: 4
Triglycerides: 144 mg/dL (ref 0.0–149.0)
VLDL: 28.8 mg/dL (ref 0.0–40.0)

## 2015-08-28 LAB — TSH: TSH: 1.26 u[IU]/mL (ref 0.35–4.50)

## 2015-08-28 LAB — FERRITIN: Ferritin: 14.9 ng/mL (ref 10.0–291.0)

## 2015-08-28 LAB — VITAMIN B12: Vitamin B-12: 1251 pg/mL — ABNORMAL HIGH (ref 211–911)

## 2015-08-28 LAB — VITAMIN D 25 HYDROXY (VIT D DEFICIENCY, FRACTURES): VITD: 33.14 ng/mL (ref 30.00–100.00)

## 2015-08-28 LAB — HEPATITIS C ANTIBODY: HCV AB: NEGATIVE

## 2015-09-01 ENCOUNTER — Encounter: Payer: Self-pay | Admitting: Family Medicine

## 2015-09-01 NOTE — Assessment & Plan Note (Signed)
Encouraged increased rest and hydration, add probiotics, zinc such as Coldeze or Xicam. Treat fevers as needed. Amoxicillin 500 mg tid

## 2015-09-18 LAB — COLOGUARD: COLOGUARD: POSITIVE

## 2015-09-28 ENCOUNTER — Telehealth: Payer: Self-pay | Admitting: Family Medicine

## 2015-09-28 NOTE — Telephone Encounter (Signed)
PCP reviewed results and instructed to call the patient inform of results and inform needs GI referral.  Called the patient did inform of results.  The patient did agree to GI referral. Will also mail a copy of results to her home.

## 2015-09-28 NOTE — Telephone Encounter (Signed)
Received phone call from [facility] that results were positive. [facility] has faxed over the results. Faxed received and on PCP's desk for review.  Have abstracted result into the patients chart.

## 2015-09-30 ENCOUNTER — Other Ambulatory Visit: Payer: Self-pay | Admitting: Family Medicine

## 2015-09-30 DIAGNOSIS — Z1211 Encounter for screening for malignant neoplasm of colon: Secondary | ICD-10-CM

## 2015-10-04 ENCOUNTER — Ambulatory Visit (AMBULATORY_SURGERY_CENTER): Payer: Self-pay

## 2015-10-04 VITALS — Ht 64.0 in | Wt 176.0 lb

## 2015-10-04 DIAGNOSIS — Z1211 Encounter for screening for malignant neoplasm of colon: Secondary | ICD-10-CM

## 2015-10-04 MED ORDER — NA SULFATE-K SULFATE-MG SULF 17.5-3.13-1.6 GM/177ML PO SOLN: 1.0000 | Freq: Once | ORAL | Status: DC

## 2015-10-04 NOTE — Progress Notes (Signed)
No egg or soy allergies Not on home 02 No previous anesthesia complications No diet or weight loss meds 

## 2015-10-18 ENCOUNTER — Other Ambulatory Visit: Payer: Self-pay

## 2015-10-18 ENCOUNTER — Other Ambulatory Visit: Payer: Self-pay | Admitting: *Deleted

## 2015-10-18 ENCOUNTER — Ambulatory Visit (AMBULATORY_SURGERY_CENTER): Payer: Medicare Other | Admitting: Gastroenterology

## 2015-10-18 ENCOUNTER — Telehealth: Payer: Self-pay | Admitting: *Deleted

## 2015-10-18 ENCOUNTER — Encounter: Payer: Self-pay | Admitting: Gastroenterology

## 2015-10-18 ENCOUNTER — Other Ambulatory Visit: Payer: Self-pay | Admitting: Neurology

## 2015-10-18 ENCOUNTER — Other Ambulatory Visit (INDEPENDENT_AMBULATORY_CARE_PROVIDER_SITE_OTHER): Payer: Medicare Other

## 2015-10-18 VITALS — BP 137/74 | HR 52 | Temp 98.6°F | Resp 22 | Ht 64.0 in | Wt 176.0 lb

## 2015-10-18 DIAGNOSIS — Z538 Procedure and treatment not carried out for other reasons: Secondary | ICD-10-CM | POA: Diagnosis not present

## 2015-10-18 DIAGNOSIS — K388 Other specified diseases of appendix: Secondary | ICD-10-CM

## 2015-10-18 DIAGNOSIS — Z1211 Encounter for screening for malignant neoplasm of colon: Secondary | ICD-10-CM | POA: Diagnosis not present

## 2015-10-18 DIAGNOSIS — D127 Benign neoplasm of rectosigmoid junction: Secondary | ICD-10-CM

## 2015-10-18 DIAGNOSIS — K389 Disease of appendix, unspecified: Secondary | ICD-10-CM | POA: Diagnosis not present

## 2015-10-18 DIAGNOSIS — R933 Abnormal findings on diagnostic imaging of other parts of digestive tract: Secondary | ICD-10-CM

## 2015-10-18 DIAGNOSIS — D128 Benign neoplasm of rectum: Secondary | ICD-10-CM

## 2015-10-18 LAB — CREATININE, SERUM: Creatinine, Ser: 1 mg/dL (ref 0.40–1.20)

## 2015-10-18 LAB — BUN: BUN: 10 mg/dL (ref 6–23)

## 2015-10-18 MED ORDER — SODIUM CHLORIDE 0.9 % IV SOLN: 500.0000 mL | INTRAVENOUS | Status: DC

## 2015-10-18 NOTE — Progress Notes (Signed)
Called to room to assist during endoscopic procedure.  Patient ID and intended procedure confirmed with present staff. Received instructions for my participation in the procedure from the performing physician.  

## 2015-10-18 NOTE — Progress Notes (Signed)
Per Dr. Havery Moros, next colonoscopy to be scheduled after CT scan and pathology results  2 bottles of contrast given to pt as well as information sheet to fill out when [facility] calls with date and time of CT scan

## 2015-10-18 NOTE — Telephone Encounter (Signed)
Per procedure report, scheduled CT abdomen/pelvis at [facility] CT on 10/21/16 at 2:00 PM. Patient has contrast and instructions for [place]. Scheduled with [facility] on 10/24/15 at 9:50 AM with Dr. Grandville Silos. Patient aware. Per MD, wait on scheduling colonoscopy until after CT results are in.

## 2015-10-18 NOTE — Progress Notes (Signed)
Stable to RR 

## 2015-10-18 NOTE — Op Note (Signed)
[place]  [address] [place], [id number]   COLONOSCOPY PROCEDURE REPORT  PATIENT: [name] Nolan, [name] Nolan  MR#: [id number] BIRTHDATE: 1945-09-17 , 70  yrs. old GENDER: female ENDOSCOPIST: Yetta Flock, MD REFERRED BY: Penni Homans MD PROCEDURE DATE:  10/18/2015 PROCEDURE:   Colonoscopy, diagnostic, Colonoscopy, screening, and Colonoscopy with snare polypectomy First Screening Colonoscopy - Avg.  risk and is 50 yrs.  old or older - No.  Prior Negative Screening - Now for repeat screening. 10 or more years since last screening  History of Adenoma - Now for follow-up colonoscopy & has been > or = to 3 yrs.  N/A  Polyps removed today? Yes ASA CLASS:   Class II INDICATIONS:positive Cologuard and Colorectal Neoplasm Risk Assessment for this procedure is average risk. MEDICATIONS: Propofol 400 mg IV and Lidocaine 40 mg IV  DESCRIPTION OF PROCEDURE:   After the risks benefits and alternatives of the procedure were thoroughly explained, informed consent was obtained.  The digital rectal exam revealed no abnormalities of the rectum.   The LB [id number]  endoscope was introduced through the anus and advanced to the terminal ileum which was intubated for a short distance. No adverse events experienced.   The quality of the prep was poor.  The instrument was then slowly withdrawn as the colon was fully examined. Estimated blood loss is zero unless otherwise noted in this procedure report.   COLON FINDINGS: The bowel preparation was poor and was not adequate for screening purposes, however the cecum and ileum was able to be reached and intubated.  The ileum was normal.  The appendical orifice was abnormal.  The based of the appendix was markedly dilated with an erythematous / ulcerated base to it.  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  The remainder of the examined colon was not well examined due to the  bowel preparation, however a roughly 1cm to 1.2cm polyp was noted in the rectosigmoid colon and removed via hot snare.  Retroflexed views revealed no abnormalities. The time to cecum = 4.8 Withdrawal time = 16.2   The scope was withdrawn and the procedure completed. COMPLICATIONS: There were no immediate complications.     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 This lesion will warrant further imaging with CT scan and warrant an evaluation for appendectomy by general surgery Rectosigmoid colon polyp removed  RECOMMENDATIONS: Await pathology results CT abdomen / pelvis Resume diet Resume medications No NSAIDs for 2 weeks post polypectomy General surgery consultation Schedule for repeat colonoscopy using different bowel preparation given positive Cologuard test  eSigned:  Yetta Flock, MD 10/18/2015 10:41 AM   cc: Penni Homans MD, the patient   PATIENT NAMEGinamarie, [name] Nolan MR#: [id number]

## 2015-10-18 NOTE — Patient Instructions (Signed)
YOU HAD AN ENDOSCOPIC PROCEDURE TODAY AT [facility]:   Refer to the procedure report that was given to you for any specific questions about what was found during the examination.  If the procedure report does not answer your questions, please call your gastroenterologist to clarify.  If you requested that your care partner not be given the details of your procedure findings, then the procedure report has been included in a sealed envelope for you to review at your convenience later.  YOU SHOULD EXPECT: Some feelings of bloating in the abdomen. Passage of more gas than usual.  Walking can help get rid of the air that was put into your GI tract during the procedure and reduce the bloating. If you had a lower endoscopy (such as a colonoscopy or flexible sigmoidoscopy) you may notice spotting of blood in your stool or on the toilet paper. If you underwent a bowel prep for your procedure, you may not have a normal bowel movement for a few days.  Please Note:  You might notice some irritation and congestion in your nose or some drainage.  This is from the oxygen used during your procedure.  There is no need for concern and it should clear up in a day or so.  SYMPTOMS TO REPORT IMMEDIATELY:   Following lower endoscopy (colonoscopy or flexible sigmoidoscopy):  Excessive amounts of blood in the stool  Significant tenderness or worsening of abdominal pains  Swelling of the abdomen that is new, acute  Fever of 100F or higher  For urgent or emergent issues, a gastroenterologist can be reached at any hour by calling [phone].  DIET: Your first meal following the procedure should be a small meal and then it is ok to progress to your normal diet. Heavy or fried foods are harder to digest and may make you feel nauseous or bloated.  Likewise, meals heavy in dairy and vegetables can increase bloating.  Drink plenty of fluids but you should avoid alcoholic beverages for 24 hours.  ACTIVITY:   You should plan to take it easy for the rest of today and you should NOT DRIVE or use heavy machinery until tomorrow (because of the sedation medicines used during the test).    FOLLOW UP: Our staff will call the number listed on your records the next business day following your procedure to check on you and address any questions or concerns that you may have regarding the information given to you following your procedure. If we do not reach you, we will leave a message.  However, if you are feeling well and you are not experiencing any problems, there is no need to return our call.  We will assume that you have returned to your regular daily activities without incident.  If any biopsies were taken you will be contacted by phone or by letter within the next 1-3 weeks.  Please call us at [phone] if you have not heard about the biopsies in 3 weeks.   SIGNATURES/CONFIDENTIALITY: You and/or your care partner have signed paperwork which will be entered into your electronic medical record.  These signatures attest to the fact that that the information above on your After Visit Summary has been reviewed and is understood.  Full responsibility of the confidentiality of this discharge information lies with you and/or your care-partner.  NO NSAIDS (Advil, Motrin, Ibuprofen, or Aleve)  Dr. Doyne Keel nurse will set you up with a CAT scan and surgery consult   Please go to  lab before leaving today  We will be contacting you to set up another colonoscopy after pathology returns

## 2015-10-18 NOTE — Telephone Encounter (Signed)
Pt needs refill on ALPRAZolam (XANAX) 1 MG tablet. Thank you. [facility]. Pt is out of medication

## 2015-10-19 ENCOUNTER — Telehealth: Payer: Self-pay | Admitting: *Deleted

## 2015-10-19 ENCOUNTER — Other Ambulatory Visit: Payer: Self-pay | Admitting: Neurology

## 2015-10-19 MED ORDER — ALPRAZOLAM 1 MG PO TABS: ORAL_TABLET | ORAL | Status: DC

## 2015-10-19 NOTE — Telephone Encounter (Signed)
  Follow up Call-  Call back number 10/18/2015  Post procedure Call Back phone  # [phone]  Permission to leave phone message Yes     Patient questions:  Do you have a fever, pain , or abdominal swelling? No. Pain Score  0 *  Have you tolerated food without any problems? Yes.    Have you been able to return to your normal activities? Yes.    Do you have any questions about your discharge instructions: Diet   No. Medications  No. Follow up visit  No.  Do you have questions or concerns about your Care? No.  Actions: * If pain score is 4 or above: No action needed, pain <4.  Pt c/o right side "aching" but states it is getting better.  She states she has passed a lot of gas.  Told to call back if aching or discomfort worsen

## 2015-10-22 ENCOUNTER — Telehealth: Payer: Self-pay | Admitting: *Deleted

## 2015-10-22 ENCOUNTER — Ambulatory Visit (INDEPENDENT_AMBULATORY_CARE_PROVIDER_SITE_OTHER)
Admission: RE | Admit: 2015-10-22 | Discharge: 2015-10-22 | Disposition: A | Discharge status: Home / Self Care | Payer: Medicare Other | Source: Ambulatory Visit | Attending: Gastroenterology | Admitting: Gastroenterology

## 2015-10-22 DIAGNOSIS — R933 Abnormal findings on diagnostic imaging of other parts of digestive tract: Secondary | ICD-10-CM | POA: Diagnosis not present

## 2015-10-22 MED ORDER — IOHEXOL 300 MG/ML  SOLN
100.0000 mL | Freq: Once | INTRAMUSCULAR | Status: AC | PRN
  Administered 2015-10-22: 100 mL via INTRAVENOUS

## 2015-10-22 NOTE — Telephone Encounter (Signed)
Received a call report on CT. Amy Esterwood, PA reviewed and no urgent findings.

## 2015-10-24 ENCOUNTER — Ambulatory Visit: Payer: Self-pay | Admitting: General Surgery

## 2015-10-25 ENCOUNTER — Telehealth: Payer: Self-pay | Admitting: *Deleted

## 2015-10-25 DIAGNOSIS — Z86010 Personal history of colonic polyps: Secondary | ICD-10-CM

## 2015-10-25 NOTE — Telephone Encounter (Signed)
Spoke with patient and scheduled colonoscopy on 10/29/15 at 3:00 PM and pre visit on 10/26/15 at 2:30 PM.

## 2015-10-25 NOTE — Telephone Encounter (Signed)
I spoke with the surgeon. They have asked that her colonoscopy be done prior to the surgery. Can you help schedule her in the next few weeks at [facility] using different prep and ensure clear liquids prior to the procedure. If no openings you can add her to a 730 slot. Thanks much

## 2015-10-25 NOTE — Telephone Encounter (Signed)
Patient called back and she is scheduled for surgery on 10/29/15. She is worried that she is scheduled for surgery prior to colonoscopy. She is worried Dr. Havery Moros will find something on the colonoscopy that would require a second surgery. She would rather have the colonoscopy and then surgery. Please, advise on what she should do.

## 2015-10-26 ENCOUNTER — Inpatient Hospital Stay (HOSPITAL_COMMUNITY): Admission: RE | Admit: 2015-10-26 | Payer: Medicare Other | Source: Ambulatory Visit

## 2015-10-26 ENCOUNTER — Ambulatory Visit (AMBULATORY_SURGERY_CENTER): Payer: Self-pay | Admitting: *Deleted

## 2015-10-26 VITALS — Ht 65.0 in | Wt 177.2 lb

## 2015-10-26 DIAGNOSIS — Z86010 Personal history of colonic polyps: Secondary | ICD-10-CM

## 2015-10-26 MED ORDER — MOVIPREP 100 G PO SOLR: ORAL | Status: DC

## 2015-10-26 NOTE — Progress Notes (Signed)
No allergies to eggs or soy. No problems with anesthesia.  Pt given Emmi instructions for colonoscopy  No oxygen use  No diet drug use  

## 2015-10-29 ENCOUNTER — Ambulatory Visit (AMBULATORY_SURGERY_CENTER): Payer: Medicare Other | Admitting: Gastroenterology

## 2015-10-29 ENCOUNTER — Encounter: Payer: Self-pay | Admitting: Gastroenterology

## 2015-10-29 ENCOUNTER — Ambulatory Visit (HOSPITAL_COMMUNITY): Admission: RE | Admit: 2015-10-29 | Payer: Medicare Other | Source: Ambulatory Visit | Admitting: General Surgery

## 2015-10-29 ENCOUNTER — Encounter (HOSPITAL_COMMUNITY): Admission: RE | Payer: Self-pay | Source: Ambulatory Visit

## 2015-10-29 VITALS — BP 128/79 | HR 55 | Temp 98.7°F | Resp 16 | Ht 65.0 in | Wt 175.0 lb

## 2015-10-29 DIAGNOSIS — D127 Benign neoplasm of rectosigmoid junction: Secondary | ICD-10-CM

## 2015-10-29 DIAGNOSIS — Z86010 Personal history of colonic polyps: Secondary | ICD-10-CM

## 2015-10-29 SURGERY — APPENDECTOMY, LAPAROSCOPIC
Anesthesia: General

## 2015-10-29 MED ORDER — SODIUM CHLORIDE 0.9 % IV SOLN: 500.0000 mL | INTRAVENOUS | Status: DC

## 2015-10-29 NOTE — Op Note (Signed)
[place]  [address] [place], [id number]   COLONOSCOPY PROCEDURE REPORT  PATIENT: [name] Nolan, [name] Nolan  MR#: [id number] BIRTHDATE: 1946/03/07 , 70  yrs. old GENDER: female ENDOSCOPIST: Yetta Flock, MD REFERRED BY: PROCEDURE DATE:  10/29/2015 PROCEDURE:   Colonoscopy, surveillance and Colonoscopy with snare polypectomy First Screening Colonoscopy - Avg.  risk and is 50 yrs.  old or older - No.  Prior Negative Screening - Now for repeat screening. N/A  History of Adenoma - Now for follow-up colonoscopy & has been > or = to 3 yrs.  N/A  Polyps removed today? 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 MEDICATIONS: Propofol 200 mg IV  DESCRIPTION OF PROCEDURE:   After the risks benefits and alternatives of the procedure were thoroughly explained, informed consent was obtained.  The digital rectal exam revealed no abnormalities of the rectum.   The LB PFC-H190 [id number]  endoscope was introduced through the anus and advanced to the terminal ileum which was intubated for a short distance. No adverse events experienced.   The quality of the prep was adequate  The instrument was then slowly withdrawn as the colon was fully examined. Estimated blood loss is zero unless otherwise noted in this procedure report.   COLON FINDINGS: The appendiceal orifice was abnormal with abnormally dilated and widened appearing base, with an erythematous appeance to the orifice.  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 appendeceal mass lesion.  The ileum was normal.  The bowel prep was considerably better than the last exam and adequate for screening purposes.  A rectosigmoid polyp, sessile, roughly 4-54mm in size was noted and removed  with cold snare.  Mild left sided diverticulosis was noted.  The polypectomy site in the rectosigmoid colon from the prior polypectomy a few weeks ago was seen and healing.  Retroflexed views revealed internal hemorrhoids. The time to cecum = 3.3 Withdrawal time = 16.8   The scope was withdrawn and the procedure completed. COMPLICATIONS: There were no immediate complications.  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

## 2015-10-29 NOTE — Progress Notes (Signed)
Called to room to assist during endoscopic procedure.  Patient ID and intended procedure confirmed with present staff. Received instructions for my participation in the procedure from the performing physician.  

## 2015-10-29 NOTE — Progress Notes (Signed)
  [facility]est[facility] Post-op Note  Patient: [name] Nolan  Procedure(s) Performed: colonoscopy  Patient Location: [facility] - Recovery Area  Anesthesia Type: Deep Sedation/Propofol  Level of Consciousness: awake, oriented and patient cooperative  Airway and Oxygen Therapy: Patient Spontanous Breathing  Post-op Pain: none  Post-op Assessment:  Post-op Vital signs reviewed, Patient's Cardiovascular Status Stable, Respiratory Function Stable, Patent Airway, No signs of Nausea or vomiting and Pain level controlled  Post-op Vital Signs: Reviewed and stable  Complications: No apparent anesthesia complications  Ladeja Pelham E 3:06 PM

## 2015-10-29 NOTE — Patient Instructions (Signed)
YOU HAD AN ENDOSCOPIC PROCEDURE TODAY AT [facility]:   Refer to the procedure report that was given to you for any specific questions about what was found during the examination.  If the procedure report does not answer your questions, please call your gastroenterologist to clarify.  If you requested that your care partner not be given the details of your procedure findings, then the procedure report has been included in a sealed envelope for you to review at your convenience later.  YOU SHOULD EXPECT: Some feelings of bloating in the abdomen. Passage of more gas than usual.  Walking can help get rid of the air that was put into your GI tract during the procedure and reduce the bloating. If you had a lower endoscopy (such as a colonoscopy or flexible sigmoidoscopy) you may notice spotting of blood in your stool or on the toilet paper. If you underwent a bowel prep for your procedure, you may not have a normal bowel movement for a few days.  Please Note:  You might notice some irritation and congestion in your nose or some drainage.  This is from the oxygen used during your procedure.  There is no need for concern and it should clear up in a day or so.  SYMPTOMS TO REPORT IMMEDIATELY:   Following lower endoscopy (colonoscopy or flexible sigmoidoscopy):  Excessive amounts of blood in the stool  Significant tenderness or worsening of abdominal pains  Swelling of the abdomen that is new, acute  Fever of 100F or higher   For urgent or emergent issues, a gastroenterologist can be reached at any hour by calling [phone].   DIET: Your first meal following the procedure should be a small meal and then it is ok to progress to your normal diet. Heavy or fried foods are harder to digest and may make you feel nauseous or bloated.  Likewise, meals heavy in dairy and vegetables can increase bloating.  Drink plenty of fluids but you should avoid alcoholic beverages for 24  hours.  ACTIVITY:  You should plan to take it easy for the rest of today and you should NOT DRIVE or use heavy machinery until tomorrow (because of the sedation medicines used during the test).    FOLLOW UP: Our staff will call the number listed on your records the next business day following your procedure to check on you and address any questions or concerns that you may have regarding the information given to you following your procedure. If we do not reach you, we will leave a message.  However, if you are feeling well and you are not experiencing any problems, there is no need to return our call.  We will assume that you have returned to your regular daily activities without incident.  If any biopsies were taken you will be contacted by phone or by letter within the next 1-3 weeks.  Please call us at [phone] if you have not heard about the biopsies in 3 weeks.    SIGNATURES/CONFIDENTIALITY: You and/or your care partner have signed paperwork which will be entered into your electronic medical record.  These signatures attest to the fact that that the information above on your After Visit Summary has been reviewed and is understood.  Full responsibility of the confidentiality of this discharge information lies with you and/or your care-partner.  Polyp, diverticulosis, hemorrhoids-handouts given  Wait biopsy results.  Cleared for appendectomy.

## 2015-10-30 ENCOUNTER — Telehealth: Payer: Self-pay

## 2015-10-30 NOTE — Telephone Encounter (Signed)
  Follow up Call-  Call back number 10/29/2015 10/18/2015  Post procedure Call Back phone  # [phone]  Permission to leave phone message Yes Yes     Patient questions:  Do you have a fever, pain , or abdominal swelling? No. Pain Score  0 *  Have you tolerated food without any problems? Yes.    Have you been able to return to your normal activities? Yes.    Do you have any questions about your discharge instructions: Diet   No. Medications  No. Follow up visit  No.  Do you have questions or concerns about your Care? No.  Actions: * If pain score is 4 or above: No action needed, pain <4.

## 2015-11-01 ENCOUNTER — Ambulatory Visit: Payer: Self-pay | Admitting: General Surgery

## 2015-11-02 ENCOUNTER — Encounter: Payer: Self-pay | Admitting: Gastroenterology

## 2015-11-06 ENCOUNTER — Encounter (HOSPITAL_COMMUNITY): Payer: Self-pay

## 2015-11-06 ENCOUNTER — Encounter (HOSPITAL_COMMUNITY)
Admission: RE | Admit: 2015-11-06 | Discharge: 2015-11-06 | Disposition: A | Discharge status: Home / Self Care | Payer: Medicare Other | Source: Ambulatory Visit | Attending: General Surgery | Admitting: General Surgery

## 2015-11-06 LAB — CBC
HCT: 41.2 % (ref 36.0–46.0)
Hemoglobin: 13.6 g/dL (ref 12.0–15.0)
MCH: 30.1 pg (ref 26.0–34.0)
MCHC: 33 g/dL (ref 30.0–36.0)
MCV: 91.2 fL (ref 78.0–100.0)
PLATELETS: 224 10*3/uL (ref 150–400)
RBC: 4.52 MIL/uL (ref 3.87–5.11)
RDW: 13.1 % (ref 11.5–15.5)
WBC: 4.7 10*3/uL (ref 4.0–10.5)

## 2015-11-06 LAB — BASIC METABOLIC PANEL
ANION GAP: 8 (ref 5–15)
BUN: 11 mg/dL (ref 6–20)
CALCIUM: 10.1 mg/dL (ref 8.9–10.3)
CO2: 25 mmol/L (ref 22–32)
Chloride: 109 mmol/L (ref 101–111)
Creatinine, Ser: 0.98 mg/dL (ref 0.44–1.00)
GFR calc Af Amer: >60 mL/min (ref >60)
GFR, EST NON AFRICAN AMERICAN: 58 mL/min — AB (ref >60)
GLUCOSE: 84 mg/dL (ref 65–99)
Potassium: 4.2 mmol/L (ref 3.5–5.1)
SODIUM: 142 mmol/L (ref 135–145)

## 2015-11-06 MED ORDER — METRONIDAZOLE IN NACL 5-0.79 MG/ML-% IV SOLN
500.0000 mg | INTRAVENOUS | Status: AC
  Administered 2015-11-07: 500 mg via INTRAVENOUS
  Filled 2015-11-06: qty 100

## 2015-11-06 MED ORDER — DEXTROSE 5 % IV SOLN
2.0000 g | INTRAVENOUS | Status: AC
  Administered 2015-11-07: 2 g via INTRAVENOUS
  Filled 2015-11-06: qty 2

## 2015-11-06 NOTE — Progress Notes (Signed)
Ebony Hail zelenak pa to review epic chart

## 2015-11-06 NOTE — Pre-Procedure Instructions (Addendum)
Bayler Stovall-Edwards  11/06/2015      [id number] - [place], [place] - [place] [address] Phone: [phone] Fax: [phone]    Your procedure is scheduled on 11/07/15.  Report to [facility] Admitting at 630 A.M.  Call this number if you have problems the morning of surgery:  [phone]   Remember:  Do not eat food or drink liquids after midnight.  Take these medicines the morning of surgery with A SIP OF WATER levothyroxine  STOP all herbel meds, nsaids (aleve,naproxen,advil,ibuprofen)Now including aspirin ,vitamins(calcium,B,D)   Do not wear jewelry, make-up or nail polish.  Do not wear lotions, powders, or perfumes.  You may wear deodorant.  Do not shave 48 hours prior to surgery.  Men may shave face and neck.  Do not bring valuables to the hospital.  [facility] is not responsible for any belongings or valuables.  Contacts, dentures or bridgework may not be worn into surgery.  Leave your suitcase in the car.  After surgery it may be brought to your room.  For patients admitted to the hospital, discharge time will be determined by your treatment team.  Patients discharged the day of surgery will not be allowed to drive home.   Name and phone number of your driver:   Special instructions:   Special Instructions: [place] - Preparing for Surgery  Before surgery, you can play an important role.  Because skin is not sterile, your skin needs to be as free of germs as possible.  You can reduce the number of germs on you skin by washing with CHG (chlorahexidine gluconate) soap before surgery.  CHG is an antiseptic cleaner which kills germs and bonds with the skin to continue killing germs even after washing.  Please DO NOT use if you have an allergy to CHG or antibacterial soaps.  If your skin becomes reddened/irritated stop using the CHG and inform your nurse when you arrive at Short Stay.  Do not shave  (including legs and underarms) for at least 48 hours prior to the first CHG shower.  You may shave your face.  Please follow these instructions carefully:   1.  Shower with CHG Soap the night before surgery and the morning of Surgery.  2.  If you choose to wash your hair, wash your hair first as usual with your normal shampoo.  3.  After you shampoo, rinse your hair and body thoroughly to remove the Shampoo.  4.  Use CHG as you would any other liquid soap.  You can apply chg directly  to the skin and wash gently with scrungie or a clean washcloth.  5.  Apply the CHG Soap to your body ONLY FROM THE NECK DOWN.  Do not use on open wounds or open sores.  Avoid contact with your eyes ears, mouth and genitals (private parts).  Wash genitals (private parts)       with your normal soap.  6.  Wash thoroughly, paying special attention to the area where your surgery will be performed.  7.  Thoroughly rinse your body with warm water from the neck down.  8.  DO NOT shower/wash with your normal soap after using and rinsing off the CHG Soap.  9.  Pat yourself dry with a clean towel.            10.  Wear clean pajamas.            11.  Place clean sheets on your bed the night of your first shower and do not sleep with pets.  Day of Surgery  Do not apply any lotions/deodorants the morning of surgery.  Please wear clean clothes to the hospital/surgery center.  Please read over the following fact sheets that you were given. Pain Booklet, Coughing and Deep Breathing and Surgical Site Infection Prevention

## 2015-11-06 NOTE — Progress Notes (Signed)
Anesthesia Chart Review: Patient is a 70 year old female scheduled for laparoscopic appendectomy with umbilical hernia repair on 11/07/15 by Dr. Jacinto Reap. [place].  History includes non-smoker, anxiety, hypothyroidism, migraines, occipital neuralgia, HLD, syncope (~ year ago in the setting of severe migraine), dysphagia, skin cancer, depression, reflux, exertional SOB, cholecystectomy '74, RLS, dysrhythmia (PVCs). PCP is Dr. Penni Homans, established since 02/2015. Seen by cardiologist Dr. Stanford Breed in 05/2014 following ED visit for chest pain and dyspnea and had a non-ischemic stress test.    Neurologist is Dr. Sarina Ill.   Meds include Xanax, Flexeril, Lexapro, levothyroxine, potassium, Maxalt, Zonegran, probiotic.   01/25/15 EKG: SR with occasional PVC, non-specific T wave abnormality.  06/20/14 Nuclear stress test: Overall Impression: Normal stress nuclear study. There is no scar or ischemia. This is a low risk scan. LV Ejection Fraction: 56%. LV Wall Motion: Normal Wall Motion.  06/12/14 Echo: Study Conclusions - Left ventricle: Fequent PVCs. Overall LVEF is probably low normal at 50% The cavity size was normal. Wall thickness was normal. No AR/AS. No MR. Trivial TR.  03/23/15 MRI brain: IMPRESSION: Minimally abnormal MRI scan of the brain showing mild changes of chronic microvascular ischemia.  Preoperative labs noted.   I called and spoke with patient. She denied chest pain. She reports chronic, stable exertional dyspnea which she feels is stable for > 1 year. No edema. Denied significant palpitations. No recent syncope. Was told she has a murmur in the past although no significant valvular abnormalities by 2015 echo. She stays active. She cleans her house and walks her dog 3-4 times a day for at least 30 minutes.   If no acute changes then I would anticipate that she could proceed as planned.   George Hugh [facility] Short Stay Center/Anesthesiology Phone [phone] 11/06/2015 3:49  PM

## 2015-11-07 ENCOUNTER — Inpatient Hospital Stay (HOSPITAL_COMMUNITY)
Admission: AD | Admit: 2015-11-07 | Discharge: 2015-11-08 | DRG: 343 | Disposition: A | Discharge status: Home / Self Care | Payer: Medicare Other | Source: Ambulatory Visit | Attending: General Surgery | Admitting: General Surgery

## 2015-11-07 ENCOUNTER — Ambulatory Visit (HOSPITAL_COMMUNITY): Payer: Medicare Other | Admitting: Certified Registered Nurse Anesthetist

## 2015-11-07 ENCOUNTER — Encounter (HOSPITAL_COMMUNITY): Payer: Self-pay | Admitting: General Practice

## 2015-11-07 ENCOUNTER — Encounter (HOSPITAL_COMMUNITY)
Admission: AD | Disposition: A | Discharge status: Home / Self Care | Payer: Self-pay | Source: Ambulatory Visit | Attending: General Surgery

## 2015-11-07 DIAGNOSIS — K429 Umbilical hernia without obstruction or gangrene: Secondary | ICD-10-CM | POA: Diagnosis present

## 2015-11-07 DIAGNOSIS — F419 Anxiety disorder, unspecified: Secondary | ICD-10-CM | POA: Diagnosis present

## 2015-11-07 DIAGNOSIS — E039 Hypothyroidism, unspecified: Secondary | ICD-10-CM | POA: Diagnosis present

## 2015-11-07 DIAGNOSIS — K8020 Calculus of gallbladder without cholecystitis without obstruction: Secondary | ICD-10-CM | POA: Diagnosis present

## 2015-11-07 DIAGNOSIS — K388 Other specified diseases of appendix: Secondary | ICD-10-CM | POA: Diagnosis present

## 2015-11-07 DIAGNOSIS — Z8371 Family history of colonic polyps: Secondary | ICD-10-CM

## 2015-11-07 DIAGNOSIS — Z79899 Other long term (current) drug therapy: Secondary | ICD-10-CM

## 2015-11-07 DIAGNOSIS — E785 Hyperlipidemia, unspecified: Secondary | ICD-10-CM | POA: Diagnosis present

## 2015-11-07 DIAGNOSIS — Z888 Allergy status to other drugs, medicaments and biological substances status: Secondary | ICD-10-CM | POA: Diagnosis not present

## 2015-11-07 DIAGNOSIS — Z885 Allergy status to narcotic agent status: Secondary | ICD-10-CM | POA: Diagnosis not present

## 2015-11-07 DIAGNOSIS — R1909 Other intra-abdominal and pelvic swelling, mass and lump: Secondary | ICD-10-CM | POA: Diagnosis present

## 2015-11-07 DIAGNOSIS — K389 Disease of appendix, unspecified: Principal | ICD-10-CM | POA: Diagnosis present

## 2015-11-07 DIAGNOSIS — E7800 Pure hypercholesterolemia, unspecified: Secondary | ICD-10-CM | POA: Diagnosis present

## 2015-11-07 HISTORY — DX: Family history of other specified conditions: Z84.89

## 2015-11-07 HISTORY — PX: LAPAROSCOPIC INCISIONAL / UMBILICAL / VENTRAL HERNIA REPAIR: SUR789

## 2015-11-07 HISTORY — PX: LAPAROSCOPIC APPENDECTOMY: SHX408

## 2015-11-07 HISTORY — DX: Basal cell carcinoma of skin of right ear and external auricular canal: C44.212

## 2015-11-07 HISTORY — PX: UMBILICAL HERNIA REPAIR: SHX196

## 2015-11-07 HISTORY — DX: Dorsalgia, unspecified: M54.9

## 2015-11-07 HISTORY — DX: Other chronic pain: G89.29

## 2015-11-07 HISTORY — PX: APPENDECTOMY: SHX54

## 2015-11-07 LAB — CBC
HEMATOCRIT: 37.6 % (ref 36.0–46.0)
Hemoglobin: 12.2 g/dL (ref 12.0–15.0)
MCH: 29.5 pg (ref 26.0–34.0)
MCHC: 32.4 g/dL (ref 30.0–36.0)
MCV: 91 fL (ref 78.0–100.0)
PLATELETS: 190 10*3/uL (ref 150–400)
RBC: 4.13 MIL/uL (ref 3.87–5.11)
RDW: 13.1 % (ref 11.5–15.5)
WBC: 6.9 10*3/uL (ref 4.0–10.5)

## 2015-11-07 LAB — CREATININE, SERUM: CREATININE: 0.86 mg/dL (ref 0.44–1.00)

## 2015-11-07 SURGERY — APPENDECTOMY, LAPAROSCOPIC
Anesthesia: General | Site: Abdomen

## 2015-11-07 MED ORDER — SODIUM CHLORIDE 0.9 % IR SOLN
Status: DC | PRN
  Administered 2015-11-07: 1000 mL

## 2015-11-07 MED ORDER — SUGAMMADEX SODIUM 200 MG/2ML IV SOLN
INTRAVENOUS | Status: DC | PRN
  Administered 2015-11-07: 150 mg via INTRAVENOUS

## 2015-11-07 MED ORDER — ONDANSETRON HCL 4 MG/2ML IJ SOLN
INTRAMUSCULAR | Status: DC | PRN
  Administered 2015-11-07: 4 mg via INTRAVENOUS

## 2015-11-07 MED ORDER — PANTOPRAZOLE SODIUM 40 MG PO TBEC
40.0000 mg | DELAYED_RELEASE_TABLET | Freq: Every day | ORAL | Status: DC
  Administered 2015-11-07 – 2015-11-08 (×2): 40 mg via ORAL
  Filled 2015-11-07 (×2): qty 1

## 2015-11-07 MED ORDER — HYDROMORPHONE HCL 1 MG/ML IJ SOLN
0.2500 mg | INTRAMUSCULAR | Status: DC | PRN
  Administered 2015-11-07 (×2): 0.5 mg via INTRAVENOUS

## 2015-11-07 MED ORDER — DIPHENHYDRAMINE HCL 12.5 MG/5ML PO ELIX: 12.5000 mg | ORAL_SOLUTION | Freq: Four times a day (QID) | ORAL | Status: DC | PRN

## 2015-11-07 MED ORDER — FENTANYL CITRATE (PF) 100 MCG/2ML IJ SOLN
INTRAMUSCULAR | Status: DC | PRN
  Administered 2015-11-07 (×2): 50 ug via INTRAVENOUS
  Administered 2015-11-07: 100 ug via INTRAVENOUS
  Administered 2015-11-07: 50 ug via INTRAVENOUS

## 2015-11-07 MED ORDER — BUPIVACAINE-EPINEPHRINE (PF) 0.25% -1:200000 IJ SOLN
INTRAMUSCULAR | Status: AC
  Filled 2015-11-07: qty 30

## 2015-11-07 MED ORDER — PROPOFOL 10 MG/ML IV BOLUS
INTRAVENOUS | Status: DC | PRN
  Administered 2015-11-07: 90 mg via INTRAVENOUS

## 2015-11-07 MED ORDER — 0.9 % SODIUM CHLORIDE (POUR BTL) OPTIME
TOPICAL | Status: DC | PRN
  Administered 2015-11-07: 1000 mL

## 2015-11-07 MED ORDER — OXYCODONE HCL 5 MG PO TABS
5.0000 mg | ORAL_TABLET | ORAL | Status: DC | PRN
  Administered 2015-11-07 (×2): 10 mg via ORAL
  Filled 2015-11-07 (×2): qty 2

## 2015-11-07 MED ORDER — MIDAZOLAM HCL 2 MG/2ML IJ SOLN
INTRAMUSCULAR | Status: AC
  Filled 2015-11-07: qty 2

## 2015-11-07 MED ORDER — BUPIVACAINE-EPINEPHRINE 0.25% -1:200000 IJ SOLN
INTRAMUSCULAR | Status: DC | PRN
  Administered 2015-11-07: 17 mL

## 2015-11-07 MED ORDER — PROPOFOL 10 MG/ML IV BOLUS
INTRAVENOUS | Status: AC
  Filled 2015-11-07: qty 20

## 2015-11-07 MED ORDER — KCL IN DEXTROSE-NACL 20-5-0.45 MEQ/L-%-% IV SOLN
INTRAVENOUS | Status: DC
  Administered 2015-11-07: 13:00:00 via INTRAVENOUS
  Filled 2015-11-07: qty 1000

## 2015-11-07 MED ORDER — FENTANYL CITRATE (PF) 250 MCG/5ML IJ SOLN
INTRAMUSCULAR | Status: AC
  Filled 2015-11-07: qty 5

## 2015-11-07 MED ORDER — FENTANYL CITRATE (PF) 100 MCG/2ML IJ SOLN: 25.0000 ug | INTRAMUSCULAR | Status: DC | PRN

## 2015-11-07 MED ORDER — HYDROMORPHONE HCL 1 MG/ML IJ SOLN
INTRAMUSCULAR | Status: AC
  Administered 2015-11-07: 0.5 mg via INTRAVENOUS
  Filled 2015-11-07: qty 1

## 2015-11-07 MED ORDER — ENOXAPARIN SODIUM 40 MG/0.4ML ~~LOC~~ SOLN
40.0000 mg | SUBCUTANEOUS | Status: DC
  Administered 2015-11-08: 40 mg via SUBCUTANEOUS
  Filled 2015-11-07: qty 0.4

## 2015-11-07 MED ORDER — CHLORHEXIDINE GLUCONATE 4 % EX LIQD: 1.0000 "application " | Freq: Once | CUTANEOUS | Status: DC

## 2015-11-07 MED ORDER — EPHEDRINE SULFATE 50 MG/ML IJ SOLN
INTRAMUSCULAR | Status: DC | PRN
  Administered 2015-11-07: 5 mg via INTRAVENOUS

## 2015-11-07 MED ORDER — PHENYLEPHRINE HCL 10 MG/ML IJ SOLN
INTRAMUSCULAR | Status: DC | PRN
  Administered 2015-11-07 (×2): 120 ug via INTRAVENOUS

## 2015-11-07 MED ORDER — OXYCODONE HCL 5 MG PO TABS
ORAL_TABLET | ORAL | Status: AC
  Filled 2015-11-07: qty 2

## 2015-11-07 MED ORDER — ACETAMINOPHEN 650 MG RE SUPP: 650.0000 mg | Freq: Four times a day (QID) | RECTAL | Status: DC | PRN

## 2015-11-07 MED ORDER — ACETAMINOPHEN 325 MG PO TABS
650.0000 mg | ORAL_TABLET | Freq: Four times a day (QID) | ORAL | Status: DC | PRN
  Administered 2015-11-07: 650 mg via ORAL
  Filled 2015-11-07: qty 2

## 2015-11-07 MED ORDER — LIDOCAINE HCL (CARDIAC) 20 MG/ML IV SOLN
INTRAVENOUS | Status: DC | PRN
  Administered 2015-11-07: 60 mg via INTRAVENOUS

## 2015-11-07 MED ORDER — ROCURONIUM BROMIDE 100 MG/10ML IV SOLN
INTRAVENOUS | Status: DC | PRN
  Administered 2015-11-07: 10 mg via INTRAVENOUS
  Administered 2015-11-07: 40 mg via INTRAVENOUS

## 2015-11-07 MED ORDER — ONDANSETRON HCL 4 MG/2ML IJ SOLN
4.0000 mg | Freq: Four times a day (QID) | INTRAMUSCULAR | Status: DC | PRN
  Administered 2015-11-07: 4 mg via INTRAVENOUS
  Filled 2015-11-07: qty 2

## 2015-11-07 MED ORDER — ONDANSETRON 4 MG PO TBDP: 4.0000 mg | ORAL_TABLET | Freq: Four times a day (QID) | ORAL | Status: DC | PRN

## 2015-11-07 MED ORDER — METHOCARBAMOL 500 MG PO TABS
500.0000 mg | ORAL_TABLET | Freq: Four times a day (QID) | ORAL | Status: DC | PRN
  Administered 2015-11-07: 500 mg via ORAL
  Filled 2015-11-07: qty 1

## 2015-11-07 MED ORDER — SUGAMMADEX SODIUM 200 MG/2ML IV SOLN
INTRAVENOUS | Status: AC
Start: 2015-11-07 — End: 2015-11-07
  Filled 2015-11-07: qty 2

## 2015-11-07 MED ORDER — LACTATED RINGERS IV SOLN
INTRAVENOUS | Status: DC | PRN
  Administered 2015-11-07 (×2): via INTRAVENOUS

## 2015-11-07 MED ORDER — DIPHENHYDRAMINE HCL 50 MG/ML IJ SOLN: 12.5000 mg | Freq: Four times a day (QID) | INTRAMUSCULAR | Status: DC | PRN

## 2015-11-07 MED ORDER — ZOLPIDEM TARTRATE 5 MG PO TABS: 5.0000 mg | ORAL_TABLET | Freq: Every evening | ORAL | Status: DC | PRN

## 2015-11-07 SURGICAL SUPPLY — 46 items
BAG SPEC RTRVL LRG 6X4 10 (ENDOMECHANICALS) ×1
CANISTER SUCTION 2500CC (MISCELLANEOUS) ×3 IMPLANT
CHLORAPREP W/TINT 26ML (MISCELLANEOUS) ×3 IMPLANT
COVER SURGICAL LIGHT HANDLE (MISCELLANEOUS) ×3 IMPLANT
CUTTER FLEX LINEAR 45M (STAPLE) ×2 IMPLANT
DEVICE SECURE STRAP 25 ABSORB (INSTRUMENTS) ×2 IMPLANT
DEVICE TROCAR PUNCTURE CLOSURE (ENDOMECHANICALS) ×3 IMPLANT
DRAPE LAPAROSCOPIC ABDOMINAL (DRAPES) ×3 IMPLANT
ELECT REM PT RETURN 9FT ADLT (ELECTROSURGICAL) ×3
ELECTRODE REM PT RTRN 9FT ADLT (ELECTROSURGICAL) ×1 IMPLANT
GLOVE BIO SURGEON STRL SZ7 (GLOVE) ×6 IMPLANT
GLOVE BIO SURGEON STRL SZ8 (GLOVE) ×5 IMPLANT
GLOVE BIOGEL PI IND STRL 7.0 (GLOVE) IMPLANT
GLOVE BIOGEL PI IND STRL 8 (GLOVE) ×1 IMPLANT
GLOVE BIOGEL PI INDICATOR 7.0 (GLOVE) ×8
GLOVE BIOGEL PI INDICATOR 8 (GLOVE) ×2
GOWN STRL REUS W/ TWL LRG LVL3 (GOWN DISPOSABLE) ×2 IMPLANT
GOWN STRL REUS W/ TWL XL LVL3 (GOWN DISPOSABLE) ×1 IMPLANT
GOWN STRL REUS W/TWL LRG LVL3 (GOWN DISPOSABLE) ×6
GOWN STRL REUS W/TWL XL LVL3 (GOWN DISPOSABLE) ×3
KIT BASIN OR (CUSTOM PROCEDURE TRAY) ×3 IMPLANT
KIT ROOM TURNOVER OR (KITS) ×3 IMPLANT
LIQUID BAND (GAUZE/BANDAGES/DRESSINGS) ×3 IMPLANT
MARKER SKIN DUAL TIP RULER LAB (MISCELLANEOUS) ×3 IMPLANT
NDL SPNL 22GX3.5 QUINCKE BK (NEEDLE) ×1 IMPLANT
NEEDLE 22X1 1/2 (OR ONLY) (NEEDLE) ×3 IMPLANT
NEEDLE SPNL 22GX3.5 QUINCKE BK (NEEDLE) ×3 IMPLANT
NS IRRIG 1000ML POUR BTL (IV SOLUTION) ×3 IMPLANT
PAD ARMBOARD 7.5X6 YLW CONV (MISCELLANEOUS) ×6 IMPLANT
POUCH SPECIMEN RETRIEVAL 10MM (ENDOMECHANICALS) ×3 IMPLANT
RELOAD 45 VASCULAR/THIN (ENDOMECHANICALS) ×3 IMPLANT
RELOAD STAPLE 45 2.5 WHT GRN (ENDOMECHANICALS) IMPLANT
SCALPEL HARMONIC ACE (MISCELLANEOUS) ×3 IMPLANT
SCISSORS LAP 5X35 DISP (ENDOMECHANICALS) ×3 IMPLANT
SET IRRIG TUBING LAPAROSCOPIC (IRRIGATION / IRRIGATOR) ×3 IMPLANT
SLEEVE ENDOPATH XCEL 5M (ENDOMECHANICALS) ×3 IMPLANT
SPECIMEN JAR SMALL (MISCELLANEOUS) ×3 IMPLANT
SUT PROLENE 0 CT 1 CR/8 (SUTURE) ×1 IMPLANT
SUT VIC AB 4-0 PS2 27 (SUTURE) ×3 IMPLANT
TOWEL OR 17X24 6PK STRL BLUE (TOWEL DISPOSABLE) ×3 IMPLANT
TRAY FOLEY CATH 16FR SILVER (SET/KITS/TRAYS/PACK) ×3 IMPLANT
TRAY LAPAROSCOPIC MC (CUSTOM PROCEDURE TRAY) ×3 IMPLANT
TROCAR XCEL 12X100 BLDLESS (ENDOMECHANICALS) ×2 IMPLANT
TROCAR XCEL BLUNT TIP 100MML (ENDOMECHANICALS) ×3 IMPLANT
TROCAR XCEL NON-BLD 5MMX100MML (ENDOMECHANICALS) ×3 IMPLANT
TUBING INSUFFLATION (TUBING) ×3 IMPLANT

## 2015-11-07 NOTE — Care Management Note (Signed)
Case Management Note  Patient Details  Name: [name] Nolan MRN: [id number] Date of Birth: 1946/05/28  Subjective/Objective:                    Action/Plan:  Initial UR completed  Expected Discharge Date:                  Expected Discharge Plan:  Home/Self Care  In-House Referral:     Discharge planning Services     Post Acute Care Choice:    Choice offered to:     DME Arranged:    DME Agency:     HH Arranged:    [place] Agency:     Status of Service:  In process, will continue to follow  Medicare Important Message Given:    Date Medicare IM Given:    Medicare IM give by:    Date Additional Medicare IM Given:    Additional Medicare Important Message give by:     If discussed at [place] of Stay Meetings, dates discussed:    Additional Comments:  Marilu Favre, RN 11/07/2015, 1:53 PM

## 2015-11-07 NOTE — Anesthesia Postprocedure Evaluation (Signed)
Anesthesia Post Note  Patient: [name] Nolan  Procedure(s) Performed: Procedure(s) (LRB): APPENDECTOMY LAPAROSCOPIC (N/A) LAPAROSCOPIC UMBILICAL HERNIA (N/A)  Patient location during evaluation: PACU Anesthesia Type: General Level of consciousness: awake and alert Pain management: pain level controlled Vital Signs Assessment: post-procedure vital signs reviewed and stable Respiratory status: spontaneous breathing, nonlabored ventilation, respiratory function stable and patient connected to nasal cannula oxygen Cardiovascular status: blood pressure returned to baseline and stable Postop Assessment: no signs of nausea or vomiting Anesthetic complications: no    Last Vitals:  Filed Vitals:   11/07/15 1122 11/07/15 1140  BP:  110/59  Pulse: 61 60  Temp: 36.7 C 36.1 C  Resp: 17 18    Last Pain:  Filed Vitals:   11/07/15 1142  PainSc: 2                  Mayfield Schoene,W. EDMOND

## 2015-11-07 NOTE — Transfer of Care (Signed)
Immediate Anesthesia Transfer of Care Note  Patient: [name] Nolan  Procedure(s) Performed: Procedure(s): APPENDECTOMY LAPAROSCOPIC (N/A) LAPAROSCOPIC UMBILICAL HERNIA (N/A)  Patient Location: PACU  Anesthesia Type:General  Level of Consciousness: awake, alert , oriented and patient cooperative  Airway & Oxygen Therapy: Patient Spontanous Breathing and Patient connected to nasal cannula oxygen  Post-op Assessment: Report given to RN and Post -op Vital signs reviewed and stable  Post vital signs: Reviewed and stable  Last Vitals:  Filed Vitals:   11/07/15 0725 11/07/15 0951  BP:    Pulse:    Temp: 36.6 C 36.2 C  Resp:  17    Complications: No apparent anesthesia complications

## 2015-11-07 NOTE — H&P (Signed)
History of Present Illness [name] Neri E. Grandville Silos MD; 10/24/2015 10:48 AM) The patient is a 70 year old female who presents with a complaint of Mass. [place] underwent a colonoscopy by Dr. Casey Cellar 10/17/14. During that time, she was found to have a mass at the base of the appendix. Additionally, 1 cm polyp was removed from the rectosigmoid. Pathology from that polyp is still pending. Dr. Havery Moros asked me to see her in consultation for appendectomy. She has been having some intermittent right lower quadrant pain that she thought was due to a pulled muscle. No change in her GI habits. Additionally, she underwent a CT scan of the abdomen and pelvis after the colonoscopy findings. This does demonstrate a mass of the appendix, consistent with a mucinous tumor. Findings are as follows:  1. There is a low-attenuation mass with a thin peripheral enhancing wall at the base of the appendix measuring 11 mm. Given the small appendiceal diverticuliti, the mass is probably chronic and has likely resulted in at least partial obstruction of the appendix for some time. The mass is nonspecific but the low-attenuation suggests a mucin-producing neoplasm. 2. No wall thickening, wall enhancement, or periappendiceal stranding to suggest acute appendicitis.   Other Problems Elbert Ewings, CMA; 10/24/2015 10:34 AM) Anxiety Disorder Cholelithiasis Hypercholesterolemia Migraine Headache Other disease, cancer, significant illness Thyroid Disease  Past Surgical History Elbert Ewings, CMA; 10/24/2015 10:34 AM) Cataract Surgery Bilateral. Colon Polyp Removal - Colonoscopy Gallbladder Surgery - Open  Diagnostic Studies History Elbert Ewings, CMA; 10/24/2015 10:34 AM) Colonoscopy within last year Mammogram 1-3 years ago Pap Smear 1-5 years ago  Allergies Elbert Ewings, CMA; 10/24/2015 10:35 AM) Codeine Sulfate *ANALGESICS - OPIOID* Statins Depletion *DIETARY PRODUCTS/DIETARY MANAGEMENT  PRODUCTS*  Medication History Elbert Ewings, CMA; 10/24/2015 10:37 AM) ALPRAZolam (1MG  Tablet, Oral) Active. Calcium 600 (1500 (600 Ca)MG Tablet, Oral) Active. Vitamin D (400UNIT Tablet, Oral) Active. Flexeril (10MG  Tablet, Oral) Active. Levothyroxine Sodium (75MCG Capsule, Oral) Active. Potassium (99MG  Tablet, Oral) Active. Probiotic Product (Oral) Active. Zonisamide (100MG  Capsule, Oral) Active. Ferrous Fumarate (325MG  Capsule, Oral) Active. Maxalt (10MG  Tablet, Oral) Active. Medications Reconciled  Social History Elbert Ewings, [place]; 10/24/2015 10:34 AM) No alcohol use No caffeine use No drug use Tobacco use Never smoker.  Family History Elbert Ewings, [place]; 10/24/2015 10:34 AM) Alcohol Abuse Brother. Arthritis Mother. Cancer Brother, Father. Colon Polyps Mother. Depression Daughter. Heart Disease Mother. Hypertension Mother. Respiratory Condition Brother.  Pregnancy / Birth History Elbert Ewings, CMA; 10/24/2015 10:34 AM) Age of menopause 40-50 Contraceptive History Oral contraceptives. Gravida 1 Maternal age 64-25 Para 1    Review of Systems Elbert Ewings CMA; 10/24/2015 10:34 AM) General Present- Weight Gain. Not Present- Appetite Loss, Chills, Fatigue, Fever, Night Sweats and Weight Loss. HEENT Present- Hearing Loss, Hoarseness, Ringing in the Ears, Seasonal Allergies and Wears glasses/contact lenses. Not Present- Earache, Nose Bleed, Oral Ulcers, Sinus Pain, Sore Throat, Visual Disturbances and Yellow Eyes. Respiratory Not Present- Bloody sputum, Chronic Cough, Difficulty Breathing, Snoring and Wheezing. Breast Not Present- Breast Mass, Breast Pain, Nipple Discharge and Skin Changes. Cardiovascular Present- Shortness of Breath. Not Present- Chest Pain, Difficulty Breathing Lying Down, Leg Cramps, Palpitations, Rapid Heart Rate and Swelling of Extremities. Gastrointestinal Present- Abdominal Pain, Bloating and Difficulty Swallowing. Not Present- Bloody  Stool, Change in Bowel Habits, Chronic diarrhea, Constipation, Excessive gas, Gets full quickly at meals, Hemorrhoids, Indigestion, Nausea, Rectal Pain and Vomiting. Female Genitourinary Not Present- Frequency, Nocturia, Painful Urination, Pelvic Pain and Urgency. Musculoskeletal Present- Back Pain. Not Present- Joint Pain, Joint Stiffness,  Muscle Pain, Muscle Weakness and Swelling of Extremities. Neurological Present- Headaches. Not Present- Decreased Memory, Fainting, Numbness, Seizures, Tingling, Tremor, Trouble walking and Weakness. Psychiatric Present- Anxiety. Not Present- Bipolar, Change in Sleep Pattern, Depression, Fearful and Frequent crying. Endocrine Present- Heat Intolerance. Not Present- Cold Intolerance, Excessive Hunger, Hair Changes, Hot flashes and New Diabetes. Hematology Not Present- Easy Bruising, Excessive bleeding, Gland problems, HIV and Persistent Infections.  Vitals Elbert Ewings CMA; 10/24/2015 10:37 AM) 10/24/2015 10:37 AM Weight: 177.8 lb Height: 65in Body Surface Area: 1.88 m Body Mass Index: 29.59 kg/m  Temp.: 97.91F  Pulse: 76 (Regular)  BP: 128/72 (Sitting, Left Arm, Standard)       Physical Exam [name] Neri E. Grandville Silos MD; 10/24/2015 10:49 AM) General Mental Status-Alert. General Appearance-Consistent with stated age. Hydration-Well hydrated. Voice-Normal.  Head and Neck Head-normocephalic, atraumatic with no lesions or palpable masses. Trachea-midline. Thyroid Gland Characteristics - normal size and consistency.  Eye Eyeball - Bilateral-Extraocular movements intact. Sclera/Conjunctiva - Bilateral-No scleral icterus.  Chest and Lung Exam Chest and lung exam reveals -quiet, even and easy respiratory effort with no use of accessory muscles and on auscultation, normal breath sounds, no adventitious sounds and normal vocal resonance. Inspection Chest Wall - Normal. Back - normal.  Cardiovascular Cardiovascular examination  reveals -normal heart sounds, regular rate and rhythm with no murmurs and normal pedal pulses bilaterally.  Abdomen Inspection Inspection of the abdomen reveals - Note: Upper midline scar, tiny umbilical hernia, mild tenderness in the right lower quadrant without mass. Palpation/Percussion Palpation and Percussion of the abdomen reveal - Soft, Non Tender, No Rebound tenderness, No Rigidity (guarding) and No hepatosplenomegaly. Auscultation Auscultation of the abdomen reveals - Bowel sounds normal.  Neurologic Neurologic evaluation reveals -alert and oriented x 3 with no impairment of recent or remote memory. Mental Status-Normal.  Musculoskeletal Global Assessment -Note: no gross deformities.  Normal Exam - Left-Upper Extremity Strength Normal and Lower Extremity Strength Normal. Normal Exam - Right-Upper Extremity Strength Normal and Lower Extremity Strength Normal.  Lymphatic Head & Neck  General Head & Neck Lymphatics: Bilateral - Description - Normal. Axillary  General Axillary Region: Bilateral - Description - Normal. Tenderness - Non Tender. Femoral & Inguinal  Generalized Femoral & Inguinal Lymphatics: Bilateral - Description - No Generalized lymphadenopathy.    Assessment & Plan [name] Neri E. 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 I have offered laparoscopic appendectomy. We discussed the procedure, risks, and benefits in detail. I also advised her we may need to convert to an open procedure in order to be sure we remove the entire appendix because the tumor is at the base. We will need to make sure the pathology from her rectosigmoid polyp is back prior to proceeding. We will try to schedule for next week.  Update: We will also fix her small umbilical hernia at the same time. Path from rectosigmoid polyp is benign. Repeat colonoscopy done with better prep and Dr. Havery Moros said there are no other  concerning colon lesions.  Georganna Skeans, MD, MPH, FACS Trauma: [phone] General Surgery: [phone]

## 2015-11-07 NOTE — Op Note (Signed)
11/07/2015  9:49 AM  PATIENT:  [name] Nolan  70 y.o. female  PRE-OPERATIVE DIAGNOSIS:  MASS OF APPENDIX (K38.9); UMBILICAL HERNIA  POST-OPERATIVE DIAGNOSIS:  MASS OF APPENDIX (K38.9); UMBILICAL HERNIA  PROCEDURE:  Procedure(s): APPENDECTOMY LAPAROSCOPIC REPAIR UMBILICAL HERNIA  SURGEON:  Surgeon(s): Georganna Skeans, MD  ASSISTANTS: none   ANESTHESIA:   local and general  EBL:  Total I/O In: 1000 [I.V.:1000] Out: -   BLOOD ADMINISTERED:none  DRAINS: none   SPECIMEN:  Excision  DISPOSITION OF SPECIMEN:  PATHOLOGY  COUNTS:  YES  DICTATION: .Dragon Dictation Findings: round mass distally on the appendix, no cecal abnormalities seen  Procedure in detail: [name] presents for appendectomy. Informed consent was obtained. She received intravenous antibiotics. She was brought to the operating room and general endotracheal anesthesia was administered by the anesthesia staff. Her abdomen was prepped and draped in sterile fashion. Foley was placed by nursing. Time out procedure was performed. Local was injected beneath her umbilicus. Infraumbilical incision was made. Subcutaneous tissues were dissected down. Her umbilical stalk was dissected off of the underlying fascia. The small umbilical hernia defect was identified. It was extended slightly and we placed a pursestring of 0 Vicryl. Hassan trocar was inserted. Abdomen was insufflated in standard fashion. Under direct vision, a 12 mm left lower quadrant and a 5 mm right abdominal port were placed. Local was used at each port site. Laparoscopic exploration revealed a mass distally on the appendix. The cecum appeared normal and was very soft without palpable mass. The base of the appendix also appeared normal. The mesoappendix was divided with the harmonic scalpel achieving excellent hemostasis. I then divided the base of the appendix taking a bit of the cecum with Endo GIA. Vascular load was used. There was excellent staple line.  Appendix was placed in a bag and removed from the left lower quadrant port site. Abdomen was irrigated. Staple line remained intact and there was no bleeding. Next, an Endo Close was used to pass 0 Vicryl suture across the umbilical defect. These were tied securely. The pursestring of 0 Vicryl was also tied closing the defect. It was airtight. Next the other ports removed under direct vision. Pneumoperitoneum was released. Wounds were irrigated and the skin was closed with running 4 Vicryl subcuticular followed by liquid band. All counts were correct. She tolerated the procedure well without apparent complication was taken recovery in stable condition. PATIENT DISPOSITION:  PACU - hemodynamically stable.   Delay start of Pharmacological VTE agent (>24hrs) due to surgical blood loss or risk of bleeding:  no  Georganna Skeans, MD, MPH, FACS Pager: [phone]  2/22/20179:49 AM

## 2015-11-07 NOTE — Anesthesia Procedure Notes (Signed)
Procedure Name: Intubation Date/Time: 11/07/2015 8:47 AM Performed by: Shirlyn Goltz Pre-anesthesia Checklist: Patient identified, Emergency Drugs available, Patient being monitored and Suction available Patient Re-evaluated:Patient Re-evaluated prior to inductionOxygen Delivery Method: Circle system utilized Preoxygenation: Pre-oxygenation with 100% oxygen Intubation Type: IV induction Ventilation: Mask ventilation without difficulty Laryngoscope Size: Mac and 3 Grade View: Grade I Tube type: Oral Tube size: 7.0 mm Number of attempts: 1 Airway Equipment and Method: Stylet Placement Confirmation: ETT inserted through vocal cords under direct vision,  positive ETCO2 and breath sounds checked- equal and bilateral Secured at: 22 cm Tube secured with: Tape Dental Injury: Teeth and Oropharynx as per pre-operative assessment

## 2015-11-07 NOTE — Anesthesia Preprocedure Evaluation (Addendum)
Anesthesia Evaluation  Patient identified by MRN, date of birth, ID band Patient awake    Reviewed: Allergy & Precautions, H&P , NPO status , Patient's Chart, lab work & pertinent test results  Airway Mallampati: II  TM Distance: >3 FB Neck ROM: Full    Dental no notable dental hx. (+) Teeth Intact, Dental Advisory Given   Pulmonary neg pulmonary ROS,    Pulmonary exam normal breath sounds clear to auscultation       Cardiovascular negative cardio ROS   Rhythm:Regular Rate:Normal     Neuro/Psych Anxiety Depression negative neurological ROS     GI/Hepatic Neg liver ROS, GERD  Medicated and Controlled,  Endo/Other  Hypothyroidism   Renal/GU negative Renal ROS  negative genitourinary   Musculoskeletal  (+) Arthritis , Osteoarthritis,    Abdominal   Peds  Hematology negative hematology ROS (+)   Anesthesia Other Findings   Reproductive/Obstetrics negative OB ROS                            Anesthesia Physical Anesthesia Plan  ASA: II  Anesthesia Plan: General   Post-op Pain Management:    Induction: Intravenous  Airway Management Planned: Oral ETT  Additional Equipment:   Intra-op Plan:   Post-operative Plan: Extubation in OR  Informed Consent: I have reviewed the patients History and Physical, chart, labs and discussed the procedure including the risks, benefits and alternatives for the proposed anesthesia with the patient or authorized representative who has indicated his/her understanding and acceptance.   Dental advisory given  Plan Discussed with: CRNA  Anesthesia Plan Comments:         Anesthesia Quick Evaluation  

## 2015-11-07 NOTE — Interval H&P Note (Signed)
History and Physical Interval Note:  11/07/2015 7:15 AM  [name] Nolan  has presented today for surgery, with the diagnosis of mas of the appendex umbilcal hernia  The various methods of treatment have been discussed with the patient and family. After consideration of risks, benefits and other options for treatment, the patient has consented to  Procedure(s): APPENDECTOMY LAPAROSCOPIC (N/A) LAPAROSCOPIC UMBILICAL HERNIA (N/A) as a surgical intervention .  The patient's history has been reviewed, patient examined, no change in status, stable for surgery.  I have reviewed the patient's chart and labs.  Questions were answered to the patient's satisfaction.     Velma Agnes E

## 2015-11-08 ENCOUNTER — Encounter (HOSPITAL_COMMUNITY): Payer: Self-pay | Admitting: General Surgery

## 2015-11-08 MED ORDER — SUMATRIPTAN SUCCINATE 100 MG PO TABS
100.0000 mg | ORAL_TABLET | ORAL | Status: DC | PRN
  Administered 2015-11-08: 100 mg via ORAL
  Filled 2015-11-08 (×2): qty 1

## 2015-11-08 MED ORDER — OXYCODONE HCL 5 MG PO TABS: 5.0000 mg | ORAL_TABLET | ORAL | Status: DC | PRN

## 2015-11-08 NOTE — Progress Notes (Signed)
Patient discharged to home with instructions. 

## 2015-11-08 NOTE — Discharge Summary (Signed)
Physician Discharge Summary  Patient ID: [name] Nolan MRN: [id number] DOB/AGE: 68/16/1947 68 y.o.  Admit date: 11/07/2015 Discharge date: 11/08/2015  Admission Diagnoses:mass of the appendix  Discharge Diagnoses: s/p lap appendectomy, umbilical hernia repair Active Problems:   Mass of appendix   Discharged Condition: good  Hospital Course: stable post-op course  Consults: None  Significant Diagnostic Studies: none  Treatments: surgery: above  Discharge Exam: Blood pressure 96/63, pulse 66, temperature 98.4 F (36.9 C), temperature source Oral, resp. rate 18, height 5\' 5"  (1.651 m), weight 80.428 kg (177 lb 5 oz), SpO2 96 %. General appearance: alert and cooperative Resp: clear to auscultation bilaterally GI: soft, incisions CDI  Disposition: 01-Home or Self Care  Discharge Instructions    Diet - low sodium heart healthy    Complete by:  As directed      Discharge instructions    Complete by:  As directed   Do not lift over 10lbs for 6 weeks. You may shower.     Increase activity slowly    Complete by:  As directed      No dressing needed    Complete by:  As directed             Medication List    TAKE these medications        ALPRAZolam 1 MG tablet  Commonly known as:  XANAX  Take 1/2 or one whole tablet hs prn sleep     B-COMPLEX/B-12 PO  Take 2,500 mg by mouth daily.     calcium carbonate 1500 (600 Ca) MG Tabs tablet  Commonly known as:  OSCAL  Take 600 mg of elemental calcium by mouth daily with breakfast.     cholecalciferol 400 units Tabs tablet  Commonly known as:  VITAMIN D  Take 400 Units by mouth daily.     cyclobenzaprine 10 MG tablet  Commonly known as:  FLEXERIL  Take 1 tablet (10 mg total) by mouth at bedtime. Can also take 1/2 to one pill twice daily for headache.     escitalopram 10 MG tablet  Commonly known as:  LEXAPRO  Take 1 tablet (10 mg total) by mouth at bedtime.     levothyroxine 75 MCG tablet  Commonly known as:   SYNTHROID, LEVOTHROID  Take 75 mcg by mouth daily before breakfast. Reported on 10/25/2015     NON FORMULARY  Take by mouth once. [facility] probiotic     oxyCODONE 5 MG immediate release tablet  Commonly known as:  Oxy IR/ROXICODONE  Take 1-2 tablets (5-10 mg total) by mouth every 4 (four) hours as needed for moderate pain or severe pain.     Potassium 99 MG Tabs  Take by mouth daily.     rizatriptan 10 MG disintegrating tablet  Commonly known as:  MAXALT-MLT  Take 1 tablet (10 mg total) by mouth as needed for migraine. May repeat in 2 hours if needed     zonisamide 100 MG capsule  Commonly known as:  ZONEGRAN  Take 2 capsules (200 mg total) by mouth at bedtime.           Follow-up Information    Follow up with Zenovia Jarred, MD.   Specialty:  General Surgery   Why:  For wound re-check as scheduled   Contact information:   [place] [id number] [phone]       Signed: Zenovia Jarred 11/08/2015, 9:12 AM

## 2015-11-21 ENCOUNTER — Other Ambulatory Visit: Payer: Self-pay | Admitting: Neurology

## 2015-11-21 MED ORDER — CYCLOBENZAPRINE HCL 10 MG PO TABS: 10.0000 mg | ORAL_TABLET | Freq: Every day | ORAL | Status: DC

## 2015-11-21 MED ORDER — ALPRAZOLAM 1 MG PO TABS: ORAL_TABLET | ORAL | Status: DC

## 2015-11-21 MED ORDER — RIZATRIPTAN BENZOATE 10 MG PO TBDP: 10.0000 mg | ORAL_TABLET | ORAL | Status: DC | PRN

## 2015-11-21 MED ORDER — ZONISAMIDE 100 MG PO CAPS: 200.0000 mg | ORAL_CAPSULE | Freq: Every day | ORAL | Status: DC

## 2015-11-23 ENCOUNTER — Other Ambulatory Visit: Payer: Self-pay | Admitting: Family Medicine

## 2015-12-12 ENCOUNTER — Encounter: Payer: Self-pay | Admitting: Neurology

## 2016-01-10 ENCOUNTER — Telehealth: Payer: Self-pay | Admitting: Neurology

## 2016-01-10 NOTE — Telephone Encounter (Signed)
Dr Jaynee Eagles- please advise, thank you  Called pt back. Offered migraine infusion tomorrow at 8am per Tina's availability. She stated the migraine infusion did not help before when she came a couple months ago. It took the edge off and as soon as she got back home that day, it came back. She declines at this time. I offered to schedule f/u next week 01/18/16 at 1000am. Pt declined stating she wants to wait and see what Dr Jaynee Eagles suggests. She took maxalt last a few days ago. I advised her to try and take this again to see if it will help with her sx. She is in agreement and will try this. She knows to go to urgent care if sx get worse or if she develops new sx.  She also has option to call on call provider at our office over weekend if needed. She verbalized understanding and appreciation.

## 2016-01-10 NOTE — Telephone Encounter (Signed)
Offer migraine infusion in office with Otila Kluver. Otherwise, if she is ok with waiting to disccuss with Dr. Jaynee Eagles, that is ok. -VRP

## 2016-01-10 NOTE — Telephone Encounter (Signed)
Pt called said she's had a migraine for 5 days. She has taken maxalt once, flexeril twice and zonegran at 5am this morning (she takes it every night )over the past 5 days. She said it has eased off some. She was inquiring if there was something else Dr Jaynee Eagles could call in for her. Pt is aware Dr Jaynee Eagles is out of the office and this can wait until she returns on Monday.

## 2016-01-12 NOTE — Telephone Encounter (Signed)
[name] Nolan, I placed a request for botox for patient for migraine. Can you see if you have that and where it stands? If we cannot get specialty pharmacy, We would do the first one buy and bill and refer out to Dr. Naaman Plummer team. Let me know thank you.

## 2016-01-14 NOTE — Telephone Encounter (Signed)
[facility] dont have a packet on this patient? I will be happy to submit her and i will also call and schedule her for the initial injection today but she will need to sign another packet because i dont have one for her. Also I will need an ICD 10.

## 2016-01-17 ENCOUNTER — Other Ambulatory Visit: Payer: Self-pay | Admitting: Neurology

## 2016-01-17 DIAGNOSIS — G43709 Chronic migraine without aura, not intractable, without status migrainosus: Secondary | ICD-10-CM

## 2016-01-17 DIAGNOSIS — G43719 Chronic migraine without aura, intractable, without status migrainosus: Secondary | ICD-10-CM | POA: Insufficient documentation

## 2016-01-17 HISTORY — DX: Chronic migraine without aura, not intractable, without status migrainosus: G43.709

## 2016-01-18 NOTE — Telephone Encounter (Signed)
Gave completed botox form to [facility]

## 2016-01-21 ENCOUNTER — Encounter: Payer: Self-pay | Admitting: Neurology

## 2016-01-21 ENCOUNTER — Telehealth: Payer: Self-pay | Admitting: Neurology

## 2016-01-21 NOTE — Telephone Encounter (Signed)
Patient called to advise, she just sent message through [place], wants to make sure Dr. Jaynee Eagles gets a chance to look at message today.

## 2016-01-22 ENCOUNTER — Encounter: Payer: Self-pay | Admitting: Neurology

## 2016-01-22 NOTE — Telephone Encounter (Signed)
I already tried her on Depakote. I will send her an email thanks

## 2016-01-23 ENCOUNTER — Encounter: Payer: Self-pay | Admitting: Neurology

## 2016-01-24 ENCOUNTER — Encounter: Payer: Self-pay | Admitting: Neurology

## 2016-01-25 ENCOUNTER — Encounter: Payer: Self-pay | Admitting: Neurology

## 2016-02-11 ENCOUNTER — Encounter: Payer: Self-pay | Admitting: Neurology

## 2016-02-12 ENCOUNTER — Encounter: Payer: Self-pay | Admitting: Neurology

## 2016-02-25 ENCOUNTER — Encounter: Payer: Self-pay | Admitting: Family Medicine

## 2016-02-25 ENCOUNTER — Ambulatory Visit (INDEPENDENT_AMBULATORY_CARE_PROVIDER_SITE_OTHER): Payer: Medicare Other | Admitting: Family Medicine

## 2016-02-25 VITALS — BP 108/72 | HR 82 | Temp 98.4°F | Ht 65.0 in | Wt 189.1 lb

## 2016-02-25 DIAGNOSIS — E669 Obesity, unspecified: Secondary | ICD-10-CM | POA: Diagnosis not present

## 2016-02-25 DIAGNOSIS — F418 Other specified anxiety disorders: Secondary | ICD-10-CM | POA: Diagnosis not present

## 2016-02-25 DIAGNOSIS — M8580 Other specified disorders of bone density and structure, unspecified site: Secondary | ICD-10-CM

## 2016-02-25 DIAGNOSIS — K5900 Constipation, unspecified: Secondary | ICD-10-CM

## 2016-02-25 DIAGNOSIS — E039 Hypothyroidism, unspecified: Secondary | ICD-10-CM

## 2016-02-25 HISTORY — DX: Constipation, unspecified: K59.00

## 2016-02-25 MED ORDER — ESCITALOPRAM OXALATE 20 MG PO TABS: 20.0000 mg | ORAL_TABLET | Freq: Every day | ORAL | Status: DC

## 2016-02-25 MED ORDER — DIAZEPAM 5 MG PO TABS: 5.0000 mg | ORAL_TABLET | Freq: Two times a day (BID) | ORAL | Status: DC | PRN

## 2016-02-25 NOTE — Progress Notes (Signed)
Pre visit review using our clinic review tool, if applicable. No additional management support is needed unless otherwise documented below in the visit note. 

## 2016-02-25 NOTE — Patient Instructions (Signed)
Try Metamucil or Benefber daily Encouraged increased hydration and fiber in diet. Daily probiotics. If bowels not moving can use MOM 2 tbls po in 4 oz of warm prune juice by mouth every 2-3 days. If no results then repeat in 4 hours with  Dulcolax suppository pr, may repeat again in 4 more hours as needed. Seek care if symptoms worsen. Consider daily Miralax and/or Dulcolax if symptoms persist.   Constipation, Adult Constipation is when a person has fewer than three bowel movements a week, has difficulty having a bowel movement, or has stools that are dry, hard, or larger than normal. As people grow older, constipation is more common. A low-fiber diet, not taking in enough fluids, and taking certain medicines may make constipation worse.  CAUSES   Certain medicines, such as antidepressants, pain medicine, iron supplements, antacids, and water pills.   Certain diseases, such as diabetes, irritable bowel syndrome (IBS), thyroid disease, or depression.   Not drinking enough water.   Not eating enough fiber-rich foods.   Stress or travel.   Lack of physical activity or exercise.   Ignoring the urge to have a bowel movement.   Using laxatives too much.  SIGNS AND SYMPTOMS   Having fewer than three bowel movements a week.   Straining to have a bowel movement.   Having stools that are hard, dry, or larger than normal.   Feeling full or bloated.   Pain in the lower abdomen.   Not feeling relief after having a bowel movement.  DIAGNOSIS  Your health care provider will take a medical history and perform a physical exam. Further testing may be done for severe constipation. Some tests may include:  A barium enema X-ray to examine your rectum, colon, and, sometimes, your small intestine.   A sigmoidoscopy to examine your lower colon.   A colonoscopy to examine your entire colon. TREATMENT  Treatment will depend on the severity of your constipation and what is causing  it. Some dietary treatments include drinking more fluids and eating more fiber-rich foods. Lifestyle treatments may include regular exercise. If these diet and lifestyle recommendations do not help, your health care provider may recommend taking over-the-counter laxative medicines to help you have bowel movements. Prescription medicines may be prescribed if over-the-counter medicines do not work.  HOME CARE INSTRUCTIONS   Eat foods that have a lot of fiber, such as fruits, vegetables, whole grains, and beans.  Limit foods high in fat and processed sugars, such as french fries, hamburgers, cookies, candies, and soda.   A fiber supplement may be added to your diet if you cannot get enough fiber from foods.   Drink enough fluids to keep your urine clear or pale yellow.   Exercise regularly or as directed by your health care provider.   Go to the restroom when you have the urge to go. Do not hold it.   Only take over-the-counter or prescription medicines as directed by your health care provider. Do not take other medicines for constipation without talking to your health care provider first.  [place] IF:   You have bright red blood in your stool.   Your constipation lasts for more than 4 days or gets worse.   You have abdominal or rectal pain.   You have thin, pencil-like stools.   You have unexplained weight loss. MAKE SURE YOU:   Understand these instructions.  Will watch your condition.  Will get help right away if you are not doing well  or get worse.   This information is not intended to replace advice given to you by your health care provider. Make sure you discuss any questions you have with your health care provider.   Document Released: 05/30/2004 Document Revised: 09/22/2014 Document Reviewed: 06/13/2013 Elsevier Interactive Patient Education [employer].

## 2016-02-26 ENCOUNTER — Telehealth: Payer: Self-pay | Admitting: Neurology

## 2016-02-26 ENCOUNTER — Encounter: Payer: Self-pay | Admitting: Neurology

## 2016-02-26 NOTE — Telephone Encounter (Signed)
[name] Nolan, patient called. Where are we on her botox? Can I schedule her? She is medicare, buy and bill, did you tell me I can just go ahead and do the botox? If so, my fault.  If so, emma can you get her schedule for me? thanks

## 2016-02-27 ENCOUNTER — Other Ambulatory Visit: Payer: Self-pay | Admitting: *Deleted

## 2016-02-27 DIAGNOSIS — G43001 Migraine without aura, not intractable, with status migrainosus: Secondary | ICD-10-CM

## 2016-02-27 MED ORDER — ONABOTULINUMTOXINA 100 UNITS IJ SOLR: INTRAMUSCULAR | Status: DC

## 2016-02-27 NOTE — Telephone Encounter (Signed)
Spoke to [employer]. Pt can go through specialty pharmacy for medication. Andee Poles is going to call pharmacy and get medication shipped. She will call pt to schedule. Ok to use 12pm slot per Dr Jaynee Eagles.

## 2016-02-27 NOTE — Telephone Encounter (Signed)
Called patient and [name] Nolan scheduled her for the 26th of this month @ 12:00.

## 2016-03-06 ENCOUNTER — Telehealth: Payer: Self-pay | Admitting: Neurology

## 2016-03-06 NOTE — Telephone Encounter (Signed)
Called the pharmacy and they stated the patient still had not given consent. The patient called our office and left a message with Mechele Claude via skype saying that the pharmacy told her she didn't need to give consent. I called the patient back and discovered she was calling her regular pharmacy, not her [facility]. I gave her the new number and asked her to call and she said she would.

## 2016-03-06 NOTE — Telephone Encounter (Signed)
Called pharmacy to schedule delivery, they informed me they were waiting on patient consent. They tried to call the patient while I was on the phone with no answer. I called the patient and requested she call the pharmacy. She said that she would. I offered her their phone number and she said she already had it.

## 2016-03-09 DIAGNOSIS — F418 Other specified anxiety disorders: Secondary | ICD-10-CM | POA: Insufficient documentation

## 2016-03-09 NOTE — Assessment & Plan Note (Signed)
On Levothyroxine, continue to monitor 

## 2016-03-09 NOTE — Assessment & Plan Note (Signed)
Encouraged to get adequate exercise, calcium and vitamin d intake 

## 2016-03-09 NOTE — Assessment & Plan Note (Signed)
Encouraged increased hydration and fiber in diet. Daily probiotics. If bowels not moving can use MOM 2 tbls po in 4 oz of warm prune juice by mouth every 2-3 days. If no results then repeat in 4 hours with  Dulcolax suppository pr, may repeat again in 4 more hours as needed. Seek care if symptoms worsen. Consider daily Miralax and/or Dulcolax if symptoms persist.  

## 2016-03-09 NOTE — Assessment & Plan Note (Signed)
Encouraged DASH diet, decrease po intake and increase exercise as tolerated. Needs 7-8 hours of sleep nightly. Avoid trans fats, eat small, frequent meals every 4-5 hours with lean proteins, complex carbs and healthy fats. Minimize simple carbs, GMO foods. 

## 2016-03-09 NOTE — Progress Notes (Signed)
Patient ID: [name] Nolan, female   DOB: 01-23-1946, 70 y.o.   MRN: [id number]   Subjective:    Patient ID: [name] Nolan, female    DOB: [birth year], 70 y.o.   MRN: [id number]  Chief Complaint  Patient presents with  . Follow-up    HPI Patient is in today for follow up. Has been struggling with anxiety and anhedonia for several months secondary to family members illness. Notes some constpation over the past 3 days. No abdominal pain or nausea. Denies CP/palp/SOB/HA/congestion/fevers or GU c/o. Taking meds as prescribed  Past Medical History  Diagnosis Date  . Hypothyroid   . Anxiety   . Occipital neuralgia   . Insomnia   . Prolonged depressive reaction   . Hyperlipidemia   . Menopause   . Fainting     fainted twice  . Dysphagia   . Osteopenia 02/26/2015  . Medicare annual wellness visit, subsequent 08/27/2015  . Low ferritin 08/27/2015    "took supplements for awhile" (11/07/2015)  . Preventative health care 08/27/2015  . Acute bronchitis 08/15/2015  . Depression   . Heart murmur   . Osteoporosis     osteopenia  . Esophageal reflux     occ  . Arrhythmia 01/25/2015    Per Dr. Jaynee Eagles, [facility]; hx PVC  . Family history of adverse reaction to anesthesia     "daughter gets bad PONV"  . Migraine     "under control w/daily RX right now" (11/07/2015)  . Arthritis     "knees" (11/07/2015)  . Chronic back pain     "mid-back; stops at the very lowest part of my back" (11/07/2015)  . Basal cell carcinoma of right ear 02/26/2015    Removed by Dr [place]  . Constipation 02/25/2016    Past Surgical History  Procedure Laterality Date  . Tooth implant      at least 5 years ago per pt  . Colonoscopy  2006  . Appendectomy  11/07/2015  . Laparoscopic incisional / umbilical / ventral hernia repair  11/07/2015    UHR  . Cholecystectomy open  1974  . Tubal ligation  1973  . Basal cell carcinoma excision Right 02/26/2015    ear  . Laparoscopic appendectomy N/A  11/07/2015    Procedure: APPENDECTOMY LAPAROSCOPIC;  Surgeon: Georganna Skeans, MD;  Location: [facility];  Service: General;  Laterality: N/A;  . Umbilical hernia repair N/A 11/07/2015    Procedure: LAPAROSCOPIC UMBILICAL HERNIA;  Surgeon: Georganna Skeans, MD;  Location[facility];  Service: General;  Laterality: N/A;    Family History  Problem Relation Age of Onset  . Congestive Heart Failure Mother   . Leukemia Father   . Kidney disease Brother   . Cancer Brother     stage 4 kidney cancer  . Leukemia Maternal Aunt   . Congestive Heart Failure Maternal Grandmother   . Colon cancer Neg Hx     Social History   Social History  . Marital Status: Married    Spouse Name: [name]  . Number of Children: 1  . Years of Education: HS   Occupational History  . Retired    Social History Main Topics  . Smoking status: Never Smoker   . Smokeless tobacco: Never Used  . Alcohol Use: No  . Drug Use: No  . Sexual Activity: Yes    Birth Control/ Protection: Post-menopausal     Comment: lives with husband, no dietary restrictions, avoids caffeine, bananas, dairy   Other Topics Concern  .  Not on file   Social History Narrative   Patient is married [name] Nolan) and lives at home with her husband.   Patient has one child.   Patient has a high school education.   Patient is right-handed.   Caffeine Use: Occasionally    Outpatient Prescriptions Prior to Visit  Medication Sig Dispense Refill  . B Complex Vitamins (B-COMPLEX/B-12 PO) Take 2,500 mg by mouth daily.     . Calcium Carbonate 1500 (600 CA) MG TABS Take 600 mg of elemental calcium by mouth daily with breakfast.     . cholecalciferol (VITAMIN D) 400 UNITS TABS tablet Take 400 Units by mouth daily.     . cyclobenzaprine (FLEXERIL) 10 MG tablet Take 1 tablet (10 mg total) by mouth at bedtime. Can also take 1/2 to one pill twice daily for headache. 90 tablet 11  . levothyroxine (SYNTHROID, LEVOTHROID) 75 MCG tablet Take 75 mcg by mouth daily before  breakfast. Reported on 10/25/2015    . NON FORMULARY Take by mouth once. [facility] probiotic    . rizatriptan (MAXALT-MLT) 10 MG disintegrating tablet Take 1 tablet (10 mg total) by mouth as needed for migraine. May repeat in 2 hours if needed 15 tablet 11  . zonisamide (ZONEGRAN) 100 MG capsule Take 2 capsules (200 mg total) by mouth at bedtime. 60 capsule 11  . ALPRAZolam (XANAX) 1 MG tablet Take 1/2 or one whole tablet hs prn sleep 30 tablet 5  . escitalopram (LEXAPRO) 10 MG tablet TAKE 1 TABLET (10 MG TOTAL) BY MOUTH AT BEDTIME. 30 tablet 5  . oxyCODONE (OXY IR/ROXICODONE) 5 MG immediate release tablet Take 1-2 tablets (5-10 mg total) by mouth every 4 (four) hours as needed for moderate pain or severe pain. 40 tablet 0  . Potassium 99 MG TABS Take by mouth daily.     No facility-administered medications prior to visit.    Allergies  Allergen Reactions  . Statins Other (See Comments)    Muscle pain  . Codeine Nausea And Vomiting    Review of Systems  Constitutional: Negative for fever and malaise/fatigue.  HENT: Negative for congestion.   Eyes: Negative for blurred vision.  Respiratory: Negative for shortness of breath.   Cardiovascular: Negative for chest pain, palpitations and leg swelling.  Gastrointestinal: Positive for abdominal pain and constipation. Negative for nausea and blood in stool.  Genitourinary: Negative for dysuria and frequency.  Musculoskeletal: Negative for falls.  Skin: Negative for rash.  Neurological: Negative for dizziness and loss of consciousness.  Endo/Heme/Allergies: Negative for environmental allergies.  Psychiatric/Behavioral: Positive for depression.       Objective:    Physical Exam  Constitutional: She is oriented to person, place, and time. She appears well-developed and well-nourished. No distress.  HENT:  Head: Normocephalic and atraumatic.  Nose: Nose normal.  Eyes: Right eye exhibits no discharge. Left eye exhibits no discharge.  Neck:  Normal range of motion. Neck supple.  Cardiovascular: Normal rate and regular rhythm.   No murmur heard. Pulmonary/Chest: Effort normal and breath sounds normal.  Abdominal: Soft. Bowel sounds are normal. There is no tenderness.  Musculoskeletal: She exhibits no edema.  Neurological: She is alert and oriented to person, place, and time.  Skin: Skin is warm and dry.  Psychiatric: She has a normal mood and affect.  Nursing note and vitals reviewed.   BP 108/72 mmHg  Pulse 82  Temp(Src) 98.4 F (36.9 C) (Oral)  Ht 5\' 5"  (1.651 m)  Wt 189 lb 2 oz (85.787  kg)  BMI 31.47 kg/m2  SpO2 97% Wt Readings from Last 3 Encounters:  02/25/16 189 lb 2 oz (85.787 kg)  11/07/15 177 lb 5 oz (80.428 kg)  11/06/15 177 lb 5 oz (80.428 kg)     Lab Results  Component Value Date   WBC 6.9 11/07/2015   HGB 12.2 11/07/2015   HCT 37.6 11/07/2015   PLT 190 11/07/2015   GLUCOSE 84 11/06/2015   CHOL 218* 08/27/2015   TRIG 144.0 08/27/2015   HDL 48.50 08/27/2015   LDLCALC 141* 08/27/2015   ALT 18 08/27/2015   AST 25 08/27/2015   NA 142 11/06/2015   K 4.2 11/06/2015   CL 109 11/06/2015   CREATININE 0.86 11/07/2015   BUN 11 11/06/2015   CO2 25 11/06/2015   TSH 1.26 08/27/2015    Lab Results  Component Value Date   TSH 1.26 08/27/2015   Lab Results  Component Value Date   WBC 6.9 11/07/2015   HGB 12.2 11/07/2015   HCT 37.6 11/07/2015   MCV 91.0 11/07/2015   PLT 190 11/07/2015   Lab Results  Component Value Date   NA 142 11/06/2015   K 4.2 11/06/2015   CO2 25 11/06/2015   GLUCOSE 84 11/06/2015   BUN 11 11/06/2015   CREATININE 0.86 11/07/2015   BILITOT 0.5 08/27/2015   ALKPHOS 80 08/27/2015   AST 25 08/27/2015   ALT 18 08/27/2015   PROT 6.9 08/27/2015   ALBUMIN 4.1 08/27/2015   CALCIUM 10.1 11/06/2015   ANIONGAP 8 11/06/2015   GFR 57.29* 08/27/2015   Lab Results  Component Value Date   CHOL 218* 08/27/2015   Lab Results  Component Value Date   HDL 48.50 08/27/2015    Lab Results  Component Value Date   LDLCALC 141* 08/27/2015   Lab Results  Component Value Date   TRIG 144.0 08/27/2015   Lab Results  Component Value Date   CHOLHDL 4 08/27/2015   No results found for: HGBA1C     Assessment & Plan:   Problem List Items Addressed This Visit    Osteopenia    Encouraged to get adequate exercise, calcium and vitamin d intake      Obesity    Encouraged DASH diet, decrease po intake and increase exercise as tolerated. Needs 7-8 hours of sleep nightly. Avoid trans fats, eat small, frequent meals every 4-5 hours with lean proteins, complex carbs and healthy fats. Minimize simple carbs, GMO foods.      Hypothyroidism    On Levothyroxine, continue to monitor      Depression with anxiety    Has been under a great deal of stress secondary to her daughter and husband's illness. May use Diazepam prn sparingly but declines daily med for now. Will call if worsens.      Relevant Medications   escitalopram (LEXAPRO) 20 MG tablet   diazepam (VALIUM) 5 MG tablet   Constipation - Primary    Encouraged increased hydration and fiber in diet. Daily probiotics. If bowels not moving can use MOM 2 tbls po in 4 oz of warm prune juice by mouth every 2-3 days. If no results then repeat in 4 hours with  Dulcolax suppository pr, may repeat again in 4 more hours as needed. Seek care if symptoms worsen. Consider daily Miralax and/or Dulcolax if symptoms persist.       Relevant Medications   escitalopram (LEXAPRO) 20 MG tablet   diazepam (VALIUM) 5 MG tablet      I  have discontinued Ms. Stovall-Edwards's Potassium, oxyCODONE, ALPRAZolam, and escitalopram. I am also having her start on escitalopram and diazepam. Additionally, I am having her maintain her levothyroxine, B Complex Vitamins (B-COMPLEX/B-12 PO), cholecalciferol, calcium carbonate, NON FORMULARY, zonisamide, rizatriptan, and cyclobenzaprine.  Meds ordered this encounter  Medications  . escitalopram  (LEXAPRO) 20 MG tablet    Sig: Take 1 tablet (20 mg total) by mouth daily.    Dispense:  90 tablet    Refill:  1  . diazepam (VALIUM) 5 MG tablet    Sig: Take 1 tablet (5 mg total) by mouth every 12 (twelve) hours as needed for anxiety.    Dispense:  30 tablet    Refill:  1    Anxiety, pain     Penni Homans, MD

## 2016-03-09 NOTE — Assessment & Plan Note (Signed)
>>  ASSESSMENT AND PLAN FOR OBESITY WRITTEN ON 03/09/2016  3:13 PM BY BLYTH, STACEY A, MD  Encouraged DASH diet, decrease po intake and increase exercise as tolerated. Needs 7-8 hours of sleep nightly. Avoid trans fats, eat small, frequent meals every 4-5 hours with lean proteins, complex carbs and healthy fats. Minimize simple carbs, GMO foods.

## 2016-03-09 NOTE — Assessment & Plan Note (Signed)
Has been under a great deal of stress secondary to her daughter and husband's illness. May use Diazepam prn sparingly but declines daily med for now. Will call if worsens.

## 2016-03-09 NOTE — Assessment & Plan Note (Signed)
>>  ASSESSMENT AND PLAN FOR HYPOTHYROIDISM WRITTEN ON 03/09/2016  3:21 PM BY BLYTH, STACEY A, MD  On Levothyroxine, continue to monitor

## 2016-03-10 ENCOUNTER — Ambulatory Visit (INDEPENDENT_AMBULATORY_CARE_PROVIDER_SITE_OTHER): Payer: Medicare Other | Admitting: Neurology

## 2016-03-10 VITALS — BP 135/87 | HR 65 | Ht 65.0 in

## 2016-03-10 DIAGNOSIS — G43719 Chronic migraine without aura, intractable, without status migrainosus: Secondary | ICD-10-CM

## 2016-03-10 NOTE — Progress Notes (Signed)
Consent Form Botulism Toxin Injection For Chronic Migraine  Botulism toxin has been approved by the Federal drug administration for treatment of chronic migraine. Botulism toxin does not cure chronic migraine and it may not be effective in some patients.  The administration of botulism toxin is accomplished by injecting a small amount of toxin into the muscles of the neck and head. Dosage must be titrated for each individual. Any benefits resulting from botulism toxin tend to wear off after 3 months with a repeat injection required if benefit is to be maintained. Injections are usually done every 3-4 months with maximum effect peak achieved by about 2 or 3 weeks. Botulism toxin is expensive and you should be sure of what costs you will incur resulting from the injection.  The side effects of botulism toxin use for chronic migraine may include:   -Transient, and usually mild, facial weakness with facial injections  -Transient, and usually mild, head or neck weakness with head/neck injections  -Reduction or loss of forehead facial animation due to forehead muscle              weakness  -Eyelid drooping  -Dry eye  -Pain at the site of injection or bruising at the site of injection  -Double vision  -Potential unknown long term risks  Contraindications: You should not have Botox if you are pregnant, nursing, allergic to albumin, have an infection, skin condition, or muscle weakness at the site of the injection, or have myasthenia gravis, Lambert-Eaton syndrome, or ALS.  It is also possible that as with any injection, there may be an allergic reaction or no effect from the medication. Reduced effectiveness after repeated injections is sometimes seen and rarely infection at the injection site may occur. All care will be taken to prevent these side effects. If therapy is given over a long time, atrophy and wasting in the muscle injected may occur. Occasionally the patient's become refractory to  treatment because they develop antibodies to the toxin. In this event, therapy needs to be modified.  I have read the above information and consent to the administration of botulism toxin.  Discussed, patient signature on file, acknowledged risks  ______________  _____   _________________  Patient signature     Date   Witness signature       BOTOX PROCEDURE NOTE FOR MIGRAINE HEADACHE    Contraindications and precautions discussed with patient(above). Aseptic procedure was observed and patient tolerated procedure. Procedure performed by Dr. Georgia Dom  The condition has existed for more than 6 months, and pt does not have a diagnosis of ALS, Myasthenia Gravis or Lambert-Eaton Syndrome. Risks and benefits of injections discussed and pt agrees to proceed with the procedure. Written consent obtained  These injections are medically necessary. This is her first injections injections. These injections do not cause sedations or hallucinations which the oral therapies may cause.  Indication/Diagnosis: chronic migraine BOTOX(J0585) injection was performed according to protocol by [employer]. 200 units of BOTOX was dissolved into 4 cc NS.  NDC: [id number]  Botox-100unitsx2 vials  Lot: [id number]  Expiration: 09/2018  [id number]   0.9% Sodium Chloride- 54mL total  [id number]  Expiration: 07/2017  NDC: [id number]    Description of procedure:  The patient was placed in a sitting position. The standard protocol was used for Botox as follows, with 5 units of Botox injected at each site:   -Procerus muscle, midline injection  -Corrugator muscle, bilateral injection  -Frontalis muscle, bilateral injection, with 2 sites each side, medial  injection was performed in the upper one third of the frontalis muscle, in the region vertical from the medial inferior edge of the superior orbital rim. The lateral injection was again in the upper one third of the forehead vertically above the  lateral limbus of the cornea, 1.5 cm lateral to the medial injection site.  -Temporalis muscle injection, 4 sites, bilaterally. The first injection was 3 cm above the tragus of the ear, second injection site was 1.5 cm to 3 cm up from the first injection site in line with the tragus of the ear. The third injection site was 1.5-3 cm forward between the first 2 injection sites. The fourth injection site was 1.5 cm posterior to the second injection site.  -Occipitalis muscle injection, 3 sites, bilaterally. The first injection was done one half way between the occipital protuberance and the tip of the mastoid process behind the ear. The second injection site was done lateral and superior to the first, 1 fingerbreadth from the first injection. The third injection site was 1 fingerbreadth superiorly and medially from the first injection site.  -Cervical paraspinal muscle injection, 2 sites, bilateral knee first injection site was 1 cm from the midline of the cervical spine, 3 cm inferior to the lower border of the occipital protuberance. The second injection site was 1.5 cm superiorly and laterally to the first injection site.  -Trapezius muscle injection was performed at 3 sites, bilaterally. The first injection site was in the upper trapezius muscle halfway between the inflection point of the neck, and the acromion. The second injection site was one half way between the acromion and the first injection site. The third injection was done between the first injection site and the inflection point of the neck.   Will return for repeat injection in 3 months.   A 200 unit sof Botox was used, 155 units were injected, the rest of the Botox was wasted. The patient tolerated the procedure well, there were no complications of the above procedure.

## 2016-03-10 NOTE — Progress Notes (Signed)
Botox-100unitsx2 vials Lot: [id number] Expiration: 09/2018 [id number]  0.9% Sodium Chloride- 25mL total [id number] Expiration: 07/2017 NDC: [id number]

## 2016-05-18 IMAGING — CT CT ABD-PELV W/ CM
2 of 5 series · 15 of 46 positions shown, 17 images · IV contrast (omnipaque)
Comparison: None.

CLINICAL DATA: Abnormal colonoscopy. Abnormal appendix. Right lower
quadrant pain.

EXAM:
CT ABDOMEN AND PELVIS WITH CONTRAST
TECHNIQUE: Multidetector CT imaging of the abdomen and pelvis was performed
using the standard protocol following bolus administration of
intravenous contrast.
CONTRAST:  100mL OMNIPAQUE IOHEXOL 300 MG/ML  SOLN

[Series 2: abd/ pelvis · axial · 0.77mm/px · z∈[+823,+1213]mm · 12 of 88 slices shown, 14 images]
[im 5/88  soft-tissue]
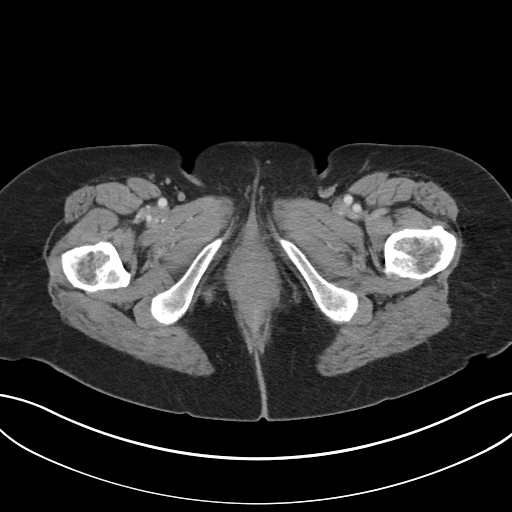
[im 5/88  bone]
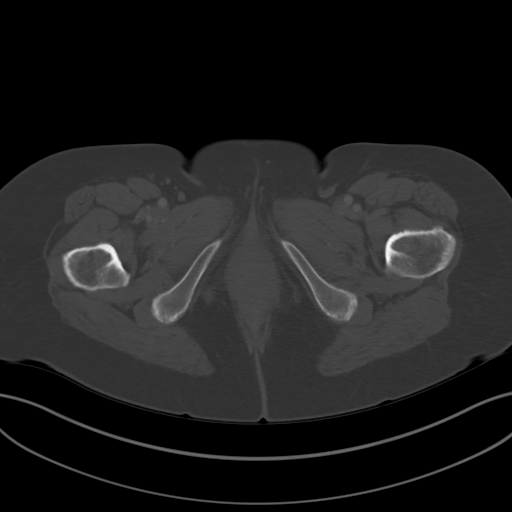
[im 14/88  soft-tissue]
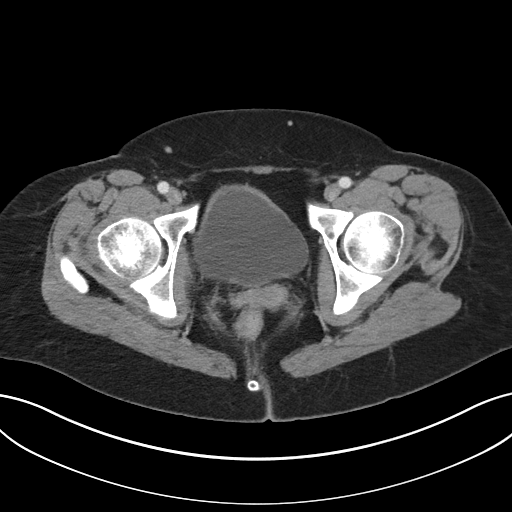
[im 18/88  soft-tissue]
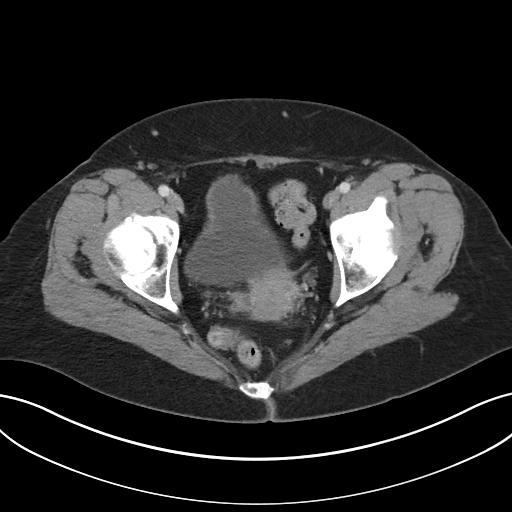
[im 27/88  soft-tissue]
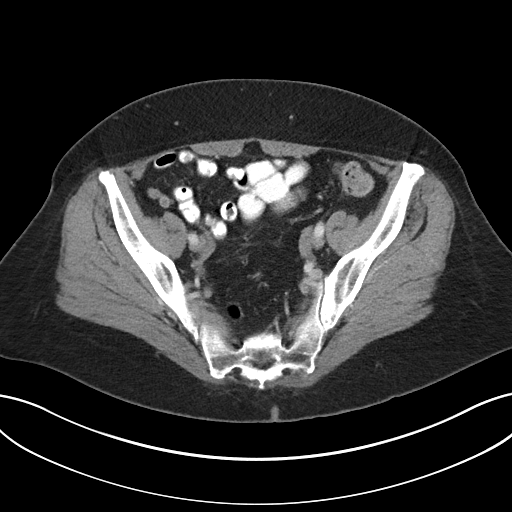
[im 35/88  soft-tissue]
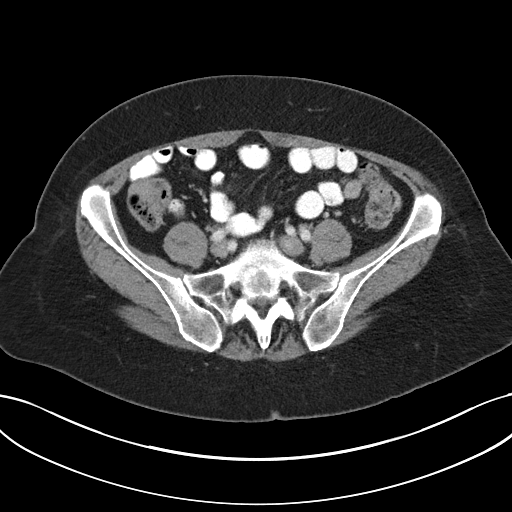
[im 40/88  soft-tissue]
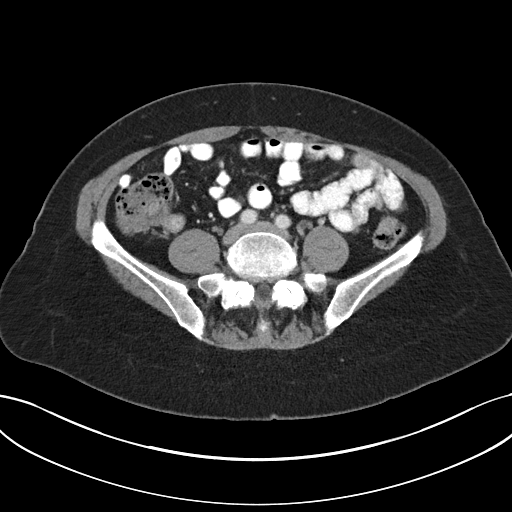
[im 48/88  soft-tissue]
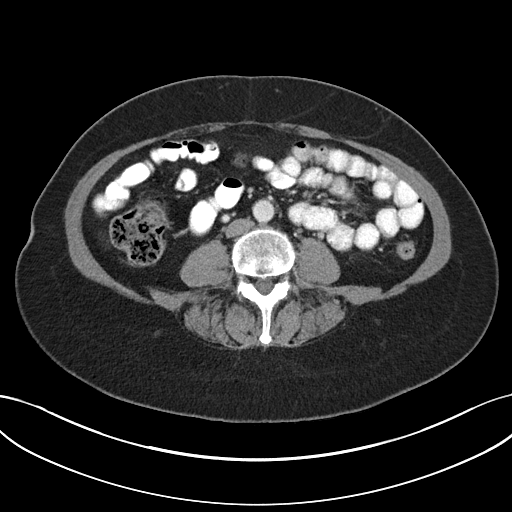
[im 53/88  soft-tissue]
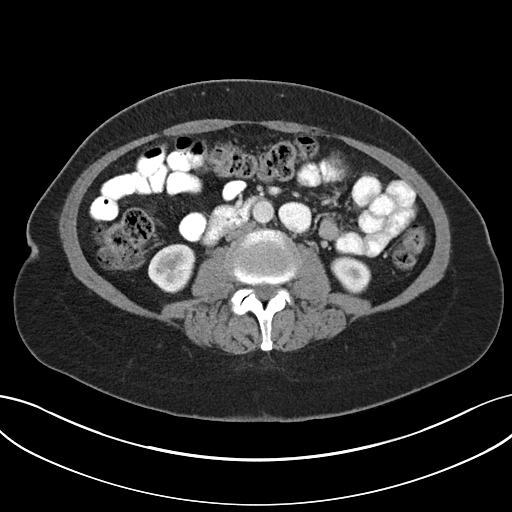
[im 61/88  soft-tissue]
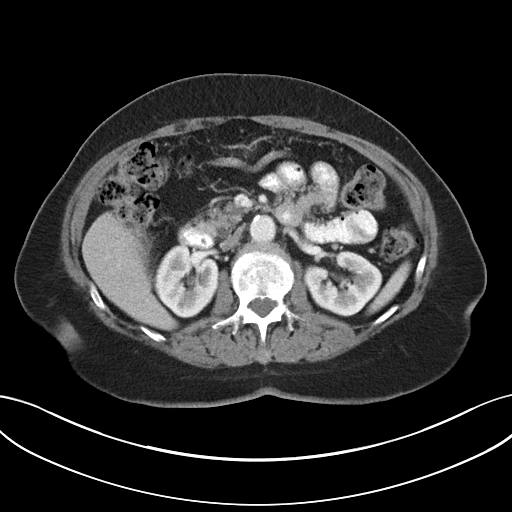
[im 61/88  bone]
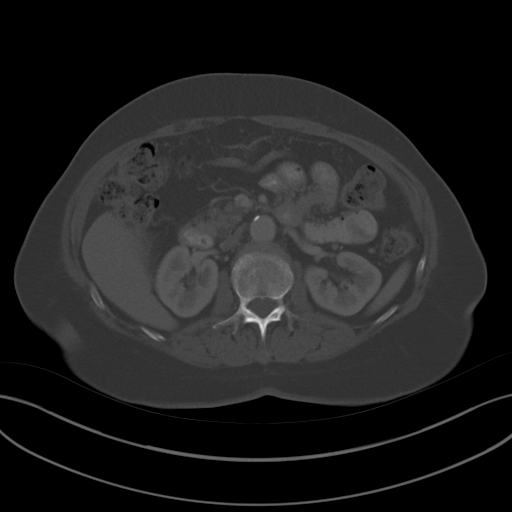
[im 70/88  soft-tissue]
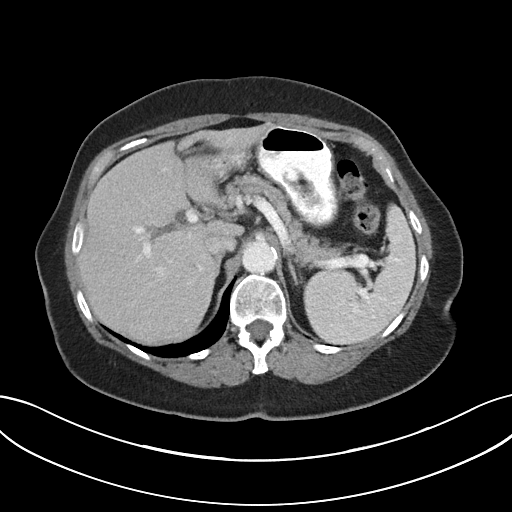
[im 74/88  soft-tissue]
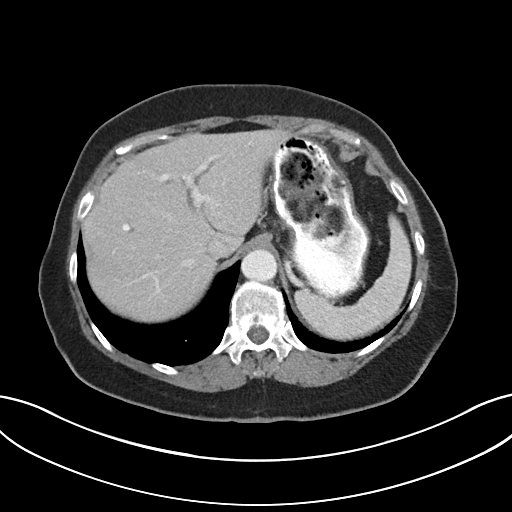
[im 83/88  soft-tissue]
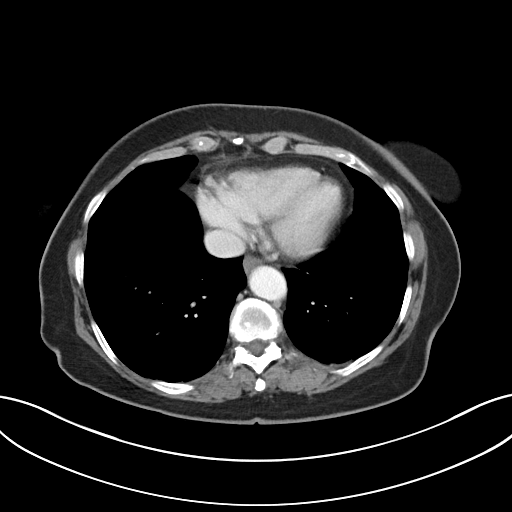

[Series 5: coronal soft tissue · coronal · 0.70mm/px · 3 of 75 slices shown]
[im 25/75  soft-tissue]
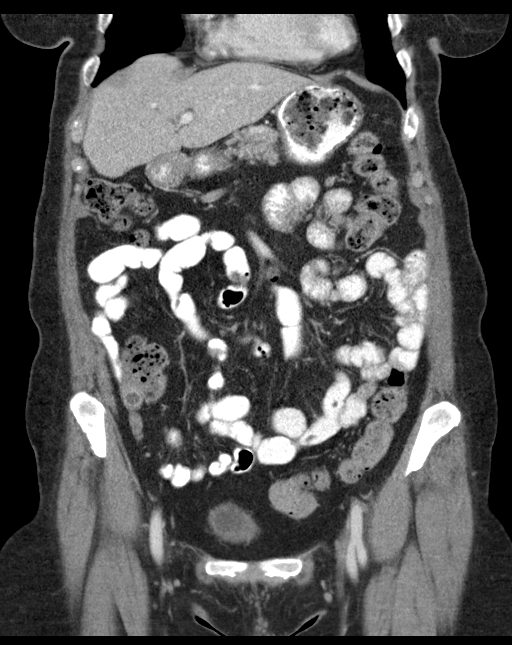
[im 33/75  soft-tissue]
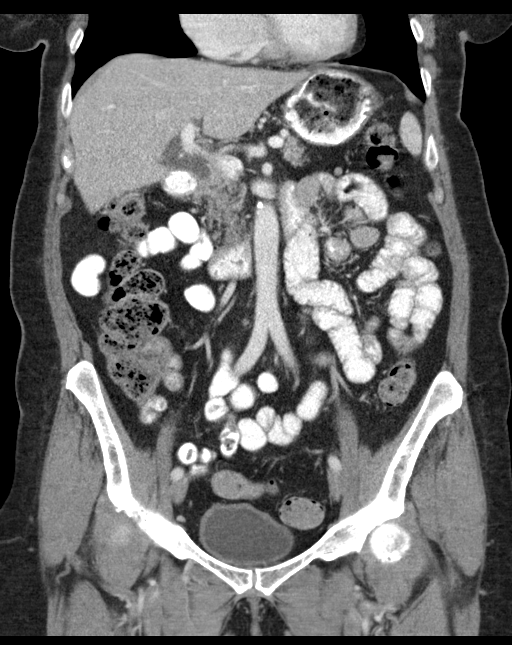
[im 42/75  soft-tissue]
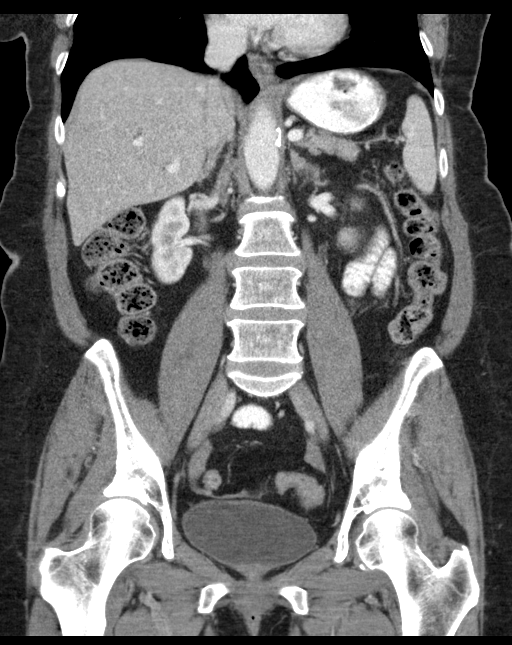

[15 of 46 positions shown; findings below may reference images not displayed]

FINDINGS: Normal lung bases.

No free air or fluid. There is a mass at the base of the appendix
best seen on coronal image 26. There appears to be a thin enhancing
wall. The mass measures up to 11 mm on coronal imaging. The
attenuation is 20 Hounsfield units which is relatively low. There is
resulting prominence of the appendix which measures up to 8 mm in
caliber. There also appears to be some fluid in the appendix,
particularly distally as seen on coronal image 30. There are also 2
or 3 focal outpouchings from the appendix, likely small diverticuli,
suggesting that the appendiceal process is chronic. There is no wall
enhancement or adjacent stranding to suggest acute appendicitis. The
remainder of the colon is normal. The stomach and small bowel are
normal as well.

The patient is status post cholecystectomy with resulting prominence
of the intra and extrahepatic biliary ducts. The liver, portal vein,
spleen, adrenal glands, pancreas, and kidneys are normal in
appearance. The abdominal aorta is non aneurysmal with scattered
atherosclerotic change. No adenopathy. A fat containing umbilical
hernia is identified.

No adenopathy or mass is otherwise seen in the pelvis. The uterus
and ovaries are normal in appearance. The bladder is unremarkable.

The visualized bones demonstrate scattered degenerative changes. No
other bony abnormalities.
IMPRESSION: 1. There is a low-attenuation mass with a thin peripheral enhancing
wall at the base of the appendix measuring 11 mm. Given the small
appendiceal diverticuliti, the mass is probably chronic and has
likely resulted in at least partial obstruction of the appendix for
some time. The mass is nonspecific but the low-attenuation suggests
a mucin-producing neoplasm.
2. No wall thickening, wall enhancement, or periappendiceal
stranding to suggest acute appendicitis.

These results will be called to the ordering clinician or
representative by the Radiologist Assistant, and communication
documented in the PACS or zVision Dashboard.

## 2016-05-27 ENCOUNTER — Encounter: Payer: Self-pay | Admitting: Family Medicine

## 2016-05-27 ENCOUNTER — Ambulatory Visit (INDEPENDENT_AMBULATORY_CARE_PROVIDER_SITE_OTHER): Payer: Medicare Other | Admitting: Family Medicine

## 2016-05-27 DIAGNOSIS — R232 Flushing: Secondary | ICD-10-CM

## 2016-05-27 DIAGNOSIS — M8580 Other specified disorders of bone density and structure, unspecified site: Secondary | ICD-10-CM | POA: Diagnosis not present

## 2016-05-27 DIAGNOSIS — N951 Menopausal and female climacteric states: Secondary | ICD-10-CM

## 2016-05-27 DIAGNOSIS — E785 Hyperlipidemia, unspecified: Secondary | ICD-10-CM

## 2016-05-27 DIAGNOSIS — E039 Hypothyroidism, unspecified: Secondary | ICD-10-CM | POA: Diagnosis not present

## 2016-05-27 DIAGNOSIS — G43009 Migraine without aura, not intractable, without status migrainosus: Secondary | ICD-10-CM

## 2016-05-27 DIAGNOSIS — F418 Other specified anxiety disorders: Secondary | ICD-10-CM

## 2016-05-27 HISTORY — DX: Flushing: R23.2

## 2016-05-27 MED ORDER — VENLAFAXINE HCL ER 75 MG PO CP24: 75.0000 mg | ORAL_CAPSULE | Freq: Every day | ORAL | 1 refills | Status: DC

## 2016-05-27 NOTE — Patient Instructions (Signed)
Melatonin 2-10 mg at bed for insomnia Encouraged good sleep hygiene such as dark, quiet room. No blue/green glowing lights such as computer screens in bedroom. No alcohol or stimulants in evening. Cut down on caffeine as able. Regular exercise is helpful but not just prior to bed time.   Insomnia Insomnia is a sleep disorder that makes it difficult to fall asleep or to stay asleep. Insomnia can cause tiredness (fatigue), low energy, difficulty concentrating, mood swings, and poor performance at work or school.  There are three different ways to classify insomnia:  Difficulty falling asleep.  Difficulty staying asleep.  Waking up too early in the morning. Any type of insomnia can be long-term (chronic) or short-term (acute). Both are common. Short-term insomnia usually lasts for three months or less. Chronic insomnia occurs at least three times a week for longer than three months. CAUSES  Insomnia may be caused by another condition, situation, or substance, such as:  Anxiety.  Certain medicines.  Gastroesophageal reflux disease (GERD) or other gastrointestinal conditions.  Asthma or other breathing conditions.  Restless legs syndrome, sleep apnea, or other sleep disorders.  Chronic pain.  Menopause. This may include hot flashes.  Stroke.  Abuse of alcohol, tobacco, or illegal drugs.  Depression.  Caffeine.   Neurological disorders, such as Alzheimer disease.  An overactive thyroid (hyperthyroidism). The cause of insomnia may not be known. RISK FACTORS Risk factors for insomnia include:  Gender. Women are more commonly affected than men.  Age. Insomnia is more common as you get older.  Stress. This may involve your professional or personal life.  Income. Insomnia is more common in people with lower income.  Lack of exercise.   Irregular work schedule or night shifts.  Traveling between different time zones. SIGNS AND SYMPTOMS If you have insomnia, trouble  falling asleep or trouble staying asleep is the main symptom. This may lead to other symptoms, such as:  Feeling fatigued.  Feeling nervous about going to sleep.  Not feeling rested in the morning.  Having trouble concentrating.  Feeling irritable, anxious, or depressed. TREATMENT  Treatment for insomnia depends on the cause. If your insomnia is caused by an underlying condition, treatment will focus on addressing the condition. Treatment may also include:   Medicines to help you sleep.  Counseling or therapy.  Lifestyle adjustments. HOME CARE INSTRUCTIONS   Take medicines only as directed by your health care provider.  Keep regular sleeping and waking hours. Avoid naps.  Keep a sleep diary to help you and your health care provider figure out what could be causing your insomnia. Include:   When you sleep.  When you wake up during the night.  How well you sleep.   How rested you feel the next day.  Any side effects of medicines you are taking.  What you eat and drink.   Make your bedroom a comfortable place where it is easy to fall asleep:  Put up shades or special blackout curtains to block light from outside.  Use a white noise machine to block noise.  Keep the temperature cool.   Exercise regularly as directed by your health care provider. Avoid exercising right before bedtime.  Use relaxation techniques to manage stress. Ask your health care provider to suggest some techniques that may work well for you. These may include:  Breathing exercises.  Routines to release muscle tension.  Visualizing peaceful scenes.  Cut back on alcohol, caffeinated beverages, and cigarettes, especially close to bedtime. These can disrupt your  sleep.  Do not overeat or eat spicy foods right before bedtime. This can lead to digestive discomfort that can make it hard for you to sleep.  Limit screen use before bedtime. This includes:  Watching TV.  Using your smartphone,  tablet, and computer.  Stick to a routine. This can help you fall asleep faster. Try to do a quiet activity, brush your teeth, and go to bed at the same time each night.  Get out of bed if you are still awake after 15 minutes of trying to sleep. Keep the lights down, but try reading or doing a quiet activity. When you feel sleepy, go back to bed.  Make sure that you drive carefully. Avoid driving if you feel very sleepy.  Keep all follow-up appointments as directed by your health care provider. This is important. SEEK MEDICAL CARE IF:   You are tired throughout the day or have trouble in your daily routine due to sleepiness.  You continue to have sleep problems or your sleep problems get worse. SEEK IMMEDIATE MEDICAL CARE IF:   You have serious thoughts about hurting yourself or someone else.   This information is not intended to replace advice given to you by your health care provider. Make sure you discuss any questions you have with your health care provider.   Document Released: 08/29/2000 Document Revised: 05/23/2015 Document Reviewed: 06/02/2014 Elsevier Interactive Patient Education [employer].

## 2016-05-27 NOTE — Progress Notes (Signed)
Pre visit review using our clinic review tool, if applicable. No additional management support is needed unless otherwise documented below in the visit note. 

## 2016-05-27 NOTE — Assessment & Plan Note (Signed)
On Levothyroxine, continue to monitor 

## 2016-05-27 NOTE — Assessment & Plan Note (Signed)
>>  ASSESSMENT AND PLAN FOR MIGRAINE WITHOUT AURA WRITTEN ON 05/27/2016  3:27 PM BY BLYTH, STACEY A, MD  Continues to struggle with headaches and is following with Dr Lucia Gaskins at [facility], has had one Botox injection series which helped initially but then when it wore off Headaches were worse. Has an appt for next week for second Botox if good response then will proceed with nerve block

## 2016-05-27 NOTE — Assessment & Plan Note (Signed)
Encouraged heart healthy diet, increase exercise, avoid trans fats, consider a krill oil cap daily 

## 2016-05-27 NOTE — Assessment & Plan Note (Signed)
Struggling with the stress of her husband's Parkinson's disease and is having more hot flashes. Switch from Lexapro 1/2 tab daily then switch to Venlafaxine 75 mg

## 2016-05-27 NOTE — Assessment & Plan Note (Signed)
Encouraged to get adequate exercise, calcium and vitamin d intake 

## 2016-05-27 NOTE — Assessment & Plan Note (Signed)
>>  ASSESSMENT AND PLAN FOR HYPOTHYROIDISM WRITTEN ON 05/27/2016  3:24 PM BY BLYTH, STACEY A, MD  On Levothyroxine, continue to monitor

## 2016-05-27 NOTE — Assessment & Plan Note (Signed)
Encouraged small, frequent meals with lean proteins and minimal complex carbs. Increase exercisem get extra sleep

## 2016-05-27 NOTE — Assessment & Plan Note (Signed)
Continues to struggle with headaches and is following with Dr Jaynee Eagles at [facility], has had one Botox injection series which helped initially but then when it wore off Headaches were worse. Has an appt for next week for second Botox if good response then will proceed with nerve block

## 2016-05-29 ENCOUNTER — Telehealth: Payer: Self-pay | Admitting: Neurology

## 2016-05-29 NOTE — Telephone Encounter (Signed)
[facility] called to schedule botox shipment. Please call [phone]

## 2016-06-01 NOTE — Progress Notes (Signed)
Patient ID: [name] Nolan, female   DOB: 1946/08/11, 70 y.o.   MRN: [id number]   Subjective:    Patient ID: [name] Nolan, female    DOB: Sep 03, 1946, 70 y.o.   MRN: [id number]  Chief Complaint  Patient presents with  . Follow-up    HPI Patient is in today for follow up. She is feeling well today but is still struggling with hot flashes, headahces and anxiety, depression. No recent illness or acute concerns. Is following with neurology for migraines and has had some botox injections which have been temporarily helpful. Denies CP/palp/SOB/HA/congestion/fevers/GI or GU c/o. Taking meds as prescribed  Past Medical History:  Diagnosis Date  . Acute bronchitis 08/15/2015  . Anxiety   . Arrhythmia 01/25/2015   Per Dr. Jaynee Eagles, [facility]; hx PVC  . Arthritis    "knees" (11/07/2015)  . Basal cell carcinoma of right ear 02/26/2015   Removed by Dr [place]  . Chronic back pain    "mid-back; stops at the very lowest part of my back" (11/07/2015)  . Constipation 02/25/2016  . Depression   . Dysphagia   . Esophageal reflux    occ  . Fainting    fainted twice  . Family history of adverse reaction to anesthesia    "daughter gets bad PONV"  . Heart murmur   . Hot flashes 05/27/2016  . Hyperlipidemia   . Hypothyroid   . Insomnia   . Low ferritin 08/27/2015   "took supplements for awhile" (11/07/2015)  . Medicare annual wellness visit, subsequent 08/27/2015  . Menopause   . Migraine    "under control w/daily RX right now" (11/07/2015)  . Occipital neuralgia   . Osteopenia 02/26/2015  . Osteoporosis    osteopenia  . Preventative health care 08/27/2015  . Prolonged depressive reaction     Past Surgical History:  Procedure Laterality Date  . APPENDECTOMY  11/07/2015  . BASAL CELL CARCINOMA EXCISION Right 02/26/2015   ear  . CHOLECYSTECTOMY OPEN  1974  . COLONOSCOPY  2006  . LAPAROSCOPIC APPENDECTOMY N/A 11/07/2015   Procedure: APPENDECTOMY LAPAROSCOPIC;  Surgeon:  Georganna Skeans, MD;  Location: [facility];  Service: General;  Laterality: N/A;  . LAPAROSCOPIC INCISIONAL / UMBILICAL / [facility]  11/07/2015   UHR  . Tooth implant     at least 5 years ago per pt  . TUBAL LIGATION  1973  . UMBILICAL HERNIA REPAIR N/A 11/07/2015   Procedure: LAPAROSCOPIC UMBILICAL HERNIA;  Surgeon: Georganna Skeans, MD;  Location: [facility] OR;  Service: General;  Laterality: N/A;    Family History  Problem Relation Age of Onset  . Congestive Heart Failure Mother   . Leukemia Father   . Kidney disease Brother   . Cancer Brother     stage 4 kidney cancer  . Leukemia Maternal Aunt   . Congestive Heart Failure Maternal Grandmother   . Colon cancer Neg Hx     Social History   Social History  . Marital status: Married    Spouse name: [name]  . Number of children: 1  . Years of education: HS   Occupational History  . Retired    Social History Main Topics  . Smoking status: Never Smoker  . Smokeless tobacco: Never Used  . Alcohol use No  . Drug use: No  . Sexual activity: Yes    Birth control/ protection: Post-menopausal     Comment: lives with husband, no dietary restrictions, avoids caffeine, bananas, dairy   Other Topics Concern  .  Not on file   Social History Narrative   Patient is married [name]) and lives at home with her husband.   Patient has one child.   Patient has a high school education.   Patient is right-handed.   Caffeine Use: Occasionally    Outpatient Medications Prior to Visit  Medication Sig Dispense Refill  . B Complex Vitamins (B-COMPLEX/B-12 PO) Take 2,500 mg by mouth daily.     . botulinum toxin Type A (BOTOX) 100 units SOLR injection To be administered by provider in office to facial and neck muscles every three months. 2 vial 3  . Calcium Carbonate 1500 (600 CA) MG TABS Take 600 mg of elemental calcium by mouth daily with breakfast.     . cholecalciferol (VITAMIN D) 400 UNITS TABS tablet Take 400 Units by mouth daily.     .  cyclobenzaprine (FLEXERIL) 10 MG tablet Take 1 tablet (10 mg total) by mouth at bedtime. Can also take 1/2 to one pill twice daily for headache. 90 tablet 11  . diazepam (VALIUM) 5 MG tablet Take 1 tablet (5 mg total) by mouth every 12 (twelve) hours as needed for anxiety. 30 tablet 1  . escitalopram (LEXAPRO) 20 MG tablet Take 1 tablet (20 mg total) by mouth daily. 90 tablet 1  . levothyroxine (SYNTHROID, LEVOTHROID) 75 MCG tablet Take 75 mcg by mouth daily before breakfast. Reported on 10/25/2015    . NON FORMULARY Take by mouth once. Phillips probiotic    . rizatriptan (MAXALT-MLT) 10 MG disintegrating tablet Take 1 tablet (10 mg total) by mouth as needed for migraine. May repeat in 2 hours if needed 15 tablet 11  . zonisamide (ZONEGRAN) 100 MG capsule Take 2 capsules (200 mg total) by mouth at bedtime. 60 capsule 11   No facility-administered medications prior to visit.     Allergies  Allergen Reactions  . Statins Other (See Comments)    Muscle pain  . Codeine Nausea And Vomiting    Review of Systems  Constitutional: Positive for malaise/fatigue. Negative for fever.  HENT: Negative for congestion.   Eyes: Negative for blurred vision.  Respiratory: Negative for shortness of breath.   Cardiovascular: Negative for chest pain, palpitations and leg swelling.  Gastrointestinal: Negative for abdominal pain, blood in stool and nausea.  Genitourinary: Negative for dysuria and frequency.  Musculoskeletal: Positive for myalgias. Negative for falls.  Skin: Negative for rash.  Neurological: Positive for headaches. Negative for dizziness and loss of consciousness.  Endo/Heme/Allergies: Negative for environmental allergies.  Psychiatric/Behavioral: Negative for depression. The patient is not nervous/anxious.        Objective:    Physical Exam  Constitutional: She is oriented to person, place, and time. She appears well-developed and well-nourished. No distress.  HENT:  Head: Normocephalic  and atraumatic.  Nose: Nose normal.  Eyes: Right eye exhibits no discharge. Left eye exhibits no discharge.  Neck: Normal range of motion. Neck supple.  Cardiovascular: Normal rate and regular rhythm.   No murmur heard. Pulmonary/Chest: Effort normal and breath sounds normal.  Abdominal: Soft. Bowel sounds are normal. There is no tenderness.  Musculoskeletal: She exhibits no edema.  Neurological: She is alert and oriented to person, place, and time.  Skin: Skin is warm and dry.  Psychiatric: She has a normal mood and affect.  Nursing note and vitals reviewed.   BP 104/73 (BP Location: Left Arm, Patient Position: Sitting, Cuff Size: Normal)   Pulse 79   Temp 98.4 F (36.9 C) (Oral)  Wt 184 lb 3.2 oz (83.6 kg)   SpO2 98%   BMI 30.65 kg/m  Wt Readings from Last 3 Encounters:  05/27/16 184 lb 3.2 oz (83.6 kg)  02/25/16 189 lb 2 oz (85.8 kg)  11/07/15 177 lb 5 oz (80.4 kg)     Lab Results  Component Value Date   WBC 6.9 11/07/2015   HGB 12.2 11/07/2015   HCT 37.6 11/07/2015   PLT 190 11/07/2015   GLUCOSE 84 11/06/2015   CHOL 218 (H) 08/27/2015   TRIG 144.0 08/27/2015   HDL 48.50 08/27/2015   LDLCALC 141 (H) 08/27/2015   ALT 18 08/27/2015   AST 25 08/27/2015   NA 142 11/06/2015   K 4.2 11/06/2015   CL 109 11/06/2015   CREATININE 0.86 11/07/2015   BUN 11 11/06/2015   CO2 25 11/06/2015   TSH 1.26 08/27/2015    Lab Results  Component Value Date   TSH 1.26 08/27/2015   Lab Results  Component Value Date   WBC 6.9 11/07/2015   HGB 12.2 11/07/2015   HCT 37.6 11/07/2015   MCV 91.0 11/07/2015   PLT 190 11/07/2015   Lab Results  Component Value Date   NA 142 11/06/2015   K 4.2 11/06/2015   CO2 25 11/06/2015   GLUCOSE 84 11/06/2015   BUN 11 11/06/2015   CREATININE 0.86 11/07/2015   BILITOT 0.5 08/27/2015   ALKPHOS 80 08/27/2015   AST 25 08/27/2015   ALT 18 08/27/2015   PROT 6.9 08/27/2015   ALBUMIN 4.1 08/27/2015   CALCIUM 10.1 11/06/2015   ANIONGAP 8  11/06/2015   GFR 57.29 (L) 08/27/2015   Lab Results  Component Value Date   CHOL 218 (H) 08/27/2015   Lab Results  Component Value Date   HDL 48.50 08/27/2015   Lab Results  Component Value Date   LDLCALC 141 (H) 08/27/2015   Lab Results  Component Value Date   TRIG 144.0 08/27/2015   Lab Results  Component Value Date   CHOLHDL 4 08/27/2015   No results found for: HGBA1C     Assessment & Plan:   Problem List Items Addressed This Visit    Migraine without aura    Continues to struggle with headaches and is following with Dr Jaynee Eagles at [facility], has had one Botox injection series which helped initially but then when it wore off Headaches were worse. Has an appt for next week for second Botox if good response then will proceed with nerve block      Relevant Medications   venlafaxine XR (EFFEXOR XR) 75 MG 24 hr capsule   Hyperlipidemia    Encouraged heart healthy diet, increase exercise, avoid trans fats, consider a krill oil cap daily      Hypothyroidism    On Levothyroxine, continue to monitor      Osteopenia    Encouraged to get adequate exercise, calcium and vitamin d intake      Depression with anxiety    Struggling with the stress of her husband's Parkinson's disease and is having more hot flashes. Switch from Lexapro 1/2 tab daily then switch to Venlafaxine 75 mg      Hot flashes    Encouraged small, frequent meals with lean proteins and minimal complex carbs. Increase exercisem get extra sleep       Other Visit Diagnoses   None.     I am having Ms. Stovall-Edwards start on venlafaxine XR. I am also having her maintain her levothyroxine, B Complex Vitamins (  B-COMPLEX/B-12 PO), cholecalciferol, calcium carbonate, NON FORMULARY, zonisamide, rizatriptan, cyclobenzaprine, escitalopram, diazepam, and botulinum toxin Type A.  Meds ordered this encounter  Medications  . venlafaxine XR (EFFEXOR XR) 75 MG 24 hr capsule    Sig: Take 1 capsule (75 mg total) by mouth  daily with breakfast.    Dispense:  90 capsule    Refill:  1     BLYTH, STACEY, MD

## 2016-06-03 ENCOUNTER — Ambulatory Visit (INDEPENDENT_AMBULATORY_CARE_PROVIDER_SITE_OTHER): Payer: Medicare Other | Admitting: Neurology

## 2016-06-03 VITALS — BP 121/80 | HR 75

## 2016-06-03 DIAGNOSIS — G43719 Chronic migraine without aura, intractable, without status migrainosus: Secondary | ICD-10-CM | POA: Diagnosis not present

## 2016-06-03 MED ORDER — TRAMADOL HCL 50 MG PO TABS: 50.0000 mg | ORAL_TABLET | Freq: Four times a day (QID) | ORAL | 3 refills | Status: DC | PRN

## 2016-06-03 NOTE — Progress Notes (Signed)

## 2016-06-03 NOTE — Progress Notes (Signed)
Botox-100unitsx2 vials Lot: [id number] Expiration: 12/2018 NDC: [id number]  0.9% Sodium Chloride- 46mL total [id number] Expiration: 01/2018 NDC: [id number]

## 2016-06-10 ENCOUNTER — Encounter: Payer: Self-pay | Admitting: Neurology

## 2016-06-12 ENCOUNTER — Telehealth: Payer: Self-pay | Admitting: Neurology

## 2016-06-12 ENCOUNTER — Encounter: Payer: Self-pay | Admitting: Neurology

## 2016-06-12 NOTE — Telephone Encounter (Signed)
Dr Jaynee Eagles- patient last saw you 06/03/16. She also sent a mychart re this yesterday. Please advise

## 2016-06-12 NOTE — Telephone Encounter (Signed)
Pt called to advise the traMADol (ULTRAM) 50 MG tablet is not working. She is wanting to know if something else could be called in. She said oxycodone makes her sick

## 2016-06-19 ENCOUNTER — Ambulatory Visit: Payer: Medicare Other | Admitting: Neurology

## 2016-07-07 ENCOUNTER — Other Ambulatory Visit: Payer: Self-pay | Admitting: Family Medicine

## 2016-07-07 DIAGNOSIS — F418 Other specified anxiety disorders: Secondary | ICD-10-CM

## 2016-07-07 DIAGNOSIS — K5900 Constipation, unspecified: Secondary | ICD-10-CM

## 2016-07-08 NOTE — Telephone Encounter (Signed)
Faxed hardcopy for Valium to [facility]

## 2016-08-18 ENCOUNTER — Telehealth: Payer: Self-pay | Admitting: Family Medicine

## 2016-08-18 MED ORDER — ALPRAZOLAM 1 MG PO TABS: ORAL_TABLET | ORAL | 0 refills | Status: DC

## 2016-08-18 NOTE — Telephone Encounter (Signed)
Patient aware of change Faxed hardcopy for alprazolam to [facility] in [place]

## 2016-08-18 NOTE — Telephone Encounter (Signed)
Ok to d/c Diazepam and restart most recent Alprazolam prescription with #30 and no refills til seen

## 2016-08-18 NOTE — Telephone Encounter (Signed)
Caller name: Deici Relationship to patient: self Can be reached: [phone] Pharmacy[facility] [place]   Reason for call: Pt said the valium make her overly tired but has horrible nightmares and can't sleep. She is asking to have her xanax again. She is out. Pt scheduled to come in 09/01/16.

## 2016-08-20 ENCOUNTER — Encounter: Payer: Self-pay | Admitting: Neurology

## 2016-08-20 NOTE — Telephone Encounter (Signed)
Called and spoke to wife. Advised per AA,MD we cannot do both appt on 08/26/16 when she comes for her botox. This is only a 13min appt unfortunately and we would have to schedule an appt for [facility] as well. We want to make sure to have enough time for his appt too.   I advised AA,MD would like him to come this week to be evaluated by NP since he has had several fals. Dr Jaynee Eagles has no openings this week but advised she can stop in while he is here to speak with him while seeing NP.She verbalized understanding.    She stated he has a cane is uses, but only sometimes. While walking the dog one day, the dog was pulling him forward and caused him to fall. He has fallenx2. Maybe more per wife.He tilts forward while walking. She denies him hitting his head when he has fallen.  He set up physical therapy at the [place] [facility] in [place] that is starting on 09/19/15. She is not sure why he set up PT there and not where Dr Jaynee Eagles wanted to refer him to.   He has gone to MD he follows at [facility] who recommends a a ramp be installed at home, handicap shower be installed. Wife was unable to go to this appt with him. She stated he was not able to get approval because it is not his house he lives in. She is going to go to next follow up appt with him to get this set up. She also stated they offered him a scooter at this appt, but he declined. He later went home that day and told his wife that he declined and unable to tell her why.   Made appt with MM, NP tomorrow 08/21/16 at 9am, check in 845am. Wife verbalized understanding.

## 2016-08-26 ENCOUNTER — Ambulatory Visit (INDEPENDENT_AMBULATORY_CARE_PROVIDER_SITE_OTHER): Payer: Medicare Other | Admitting: Neurology

## 2016-08-26 ENCOUNTER — Encounter: Payer: Self-pay | Admitting: Neurology

## 2016-08-26 VITALS — BP 146/83 | HR 66 | Ht 65.0 in | Wt 186.8 lb

## 2016-08-26 DIAGNOSIS — G43719 Chronic migraine without aura, intractable, without status migrainosus: Secondary | ICD-10-CM

## 2016-08-26 NOTE — Progress Notes (Signed)
[facility]   Consent Form Botulism Toxin Injection For Chronic Migraine  Botulism toxin has been approved by the Federal drug administration for treatment of chronic migraine. Botulism toxin does not cure chronic migraine and it may not be effective in some patients.  The administration of botulism toxin is accomplished by injecting a small amount of toxin into the muscles of the neck and head. Dosage must be titrated for each individual. Any benefits resulting from botulism toxin tend to wear off after 3 months with a repeat injection required if benefit is to be maintained. Injections are usually done every 3-4 months with maximum effect peak achieved by about 2 or 3 weeks. Botulism toxin is expensive and you should be sure of what costs you will incur resulting from the injection.  The side effects of botulism toxin use for chronic migraine may include:              -Transient, and usually mild, facial weakness with facial injections             -Transient, and usually mild, head or neck weakness with head/neck injections             -Reduction or loss of forehead facial animation due to forehead muscle              weakness             -Eyelid drooping             -Dry eye             -Pain at the site of injection or bruising at the site of injection             -Double vision             -Potential unknown long term risks  Contraindications: You should not have Botox if you are pregnant, nursing, allergic to albumin, have an infection, skin condition, or muscle weakness at the site of the injection, or have myasthenia gravis, Lambert-Eaton syndrome, or ALS.  It is also possible that as with any injection, there may be an allergic reaction or no effect from the medication. Reduced effectiveness after repeated injections is sometimes seen and rarely infection at the injection site may occur. All care will be taken to prevent these side effects. If therapy is  given over a long time, atrophy and wasting in the muscle injected may occur. Occasionally the patient's become refractory to treatment because they develop antibodies to the toxin. In this event, therapy needs to be modified.  I have read the above information and consent to the administration of botulism toxin.               ______________                    _____                          _________________             Patient signature                     Date                             Witness signature       BOTOX PROCEDURE NOTE FOR MIGRAINE HEADACHE    Contraindications and precautions  discussed with patient(above). Aseptic procedure was observed and patient tolerated procedure. Procedure performed by Dr. Georgia Dom  The condition has existed for more than 6 months, and pt does not have a diagnosis of ALS, Myasthenia Gravis or Lambert-Eaton Syndrome. Risks and benefits of injections discussed and pt agrees to proceed with the procedure. Written consent obtained  These injections are medically necessary. He receives good benefits from these injections. These injections do not cause sedations or hallucinations which the oral therapies may cause.  Indication/Diagnosis: chronic migraine BOTOX(J0585) injection was performed according to protocol by [employer]. 200 units of BOTOX was dissolved into 4 cc NS.  NDC: [id number]  Botox-100unitsx2 vials Lot: [id number] Expiration: 02/2019 [id number]  0.9% Sodium Chloride bactreiostatic- 58mL total Lot: [id number] Expiration: 02/13/2018 NDC: [id number]  Dx: [id number] Specialty pharmacy              Description of procedure:  The patient was placed in a sitting position. The standard protocol was used for Botox as follows, with 5 units of Botox injected at each site:   -Procerus muscle, midline injection  -Corrugator muscle, bilateral injection  -Frontalis muscle, bilateral  injection, with 2 sites each side, medial injection was performed in the upper one third of the frontalis muscle, in the region vertical from the medial inferior edge of the superior orbital rim. The lateral injection was again in the upper one third of the forehead vertically above the lateral limbus of the cornea, 1.5 cm lateral to the medial injection site.  -Temporalis muscle injection, 4 sites, bilaterally. The first injection was 3 cm above the tragus of the ear, second injection site was 1.5 cm to 3 cm up from the first injection site in line with the tragus of the ear. The third injection site was 1.5-3 cm forward between the first 2 injection sites. The fourth injection site was 1.5 cm posterior to the second injection site.  -Occipitalis muscle injection, 3 sites, bilaterally. The first injection was done one half way between the occipital protuberance and the tip of the mastoid process behind the ear. The second injection site was done lateral and superior to the first, 1 fingerbreadth from the first injection. The third injection site was 1 fingerbreadth superiorly and medially from the first injection site.  -Cervical paraspinal muscle injection, 2 sites, bilateral knee first injection site was 1 cm from the midline of the cervical spine, 3 cm inferior to the lower border of the occipital protuberance. The second injection site was 1.5 cm superiorly and laterally to the first injection site.  -Trapezius muscle injection was performed at 3 sites, bilaterally. The first injection site was in the upper trapezius muscle halfway between the inflection point of the neck, and the acromion. The second injection site was one half way between the acromion and the first injection site. The third injection was done between the first injection site and the inflection point of the neck.   Will return for repeat injection in 3 months.   A 200 unit sof Botox was used, 155 units were injected, the  rest of the Botox was wasted. The patient tolerated the procedure well, there were no complications of the above procedure.

## 2016-08-26 NOTE — Progress Notes (Signed)
Botox-100unitsx2 vials Lot: [id number] Expiration: 02/2019 [id number]  0.9% Sodium Chloride bactreiostatic- 31mL total Lot: [id number] Expiration: 02/13/2018 NDC: [id number]  Dx: [id number] Specialty pharmacy

## 2016-09-01 ENCOUNTER — Ambulatory Visit (INDEPENDENT_AMBULATORY_CARE_PROVIDER_SITE_OTHER): Payer: Medicare Other | Admitting: Family Medicine

## 2016-09-01 ENCOUNTER — Encounter: Payer: Self-pay | Admitting: Family Medicine

## 2016-09-01 VITALS — BP 136/86 | HR 72 | Temp 99.0°F | Resp 16 | Ht 65.0 in | Wt 185.0 lb

## 2016-09-01 DIAGNOSIS — E538 Deficiency of other specified B group vitamins: Secondary | ICD-10-CM

## 2016-09-01 DIAGNOSIS — F418 Other specified anxiety disorders: Secondary | ICD-10-CM | POA: Diagnosis not present

## 2016-09-01 DIAGNOSIS — Z0001 Encounter for general adult medical examination with abnormal findings: Secondary | ICD-10-CM | POA: Diagnosis not present

## 2016-09-01 DIAGNOSIS — R519 Headache, unspecified: Secondary | ICD-10-CM

## 2016-09-01 DIAGNOSIS — I499 Cardiac arrhythmia, unspecified: Secondary | ICD-10-CM

## 2016-09-01 DIAGNOSIS — E039 Hypothyroidism, unspecified: Secondary | ICD-10-CM

## 2016-09-01 DIAGNOSIS — Z0000 Encounter for general adult medical examination without abnormal findings: Secondary | ICD-10-CM

## 2016-09-01 DIAGNOSIS — E785 Hyperlipidemia, unspecified: Secondary | ICD-10-CM

## 2016-09-01 DIAGNOSIS — R51 Headache: Secondary | ICD-10-CM | POA: Diagnosis not present

## 2016-09-01 HISTORY — DX: Deficiency of other specified B group vitamins: E53.8

## 2016-09-01 MED ORDER — VENLAFAXINE HCL ER 75 MG PO CP24: 75.0000 mg | ORAL_CAPSULE | Freq: Every day | ORAL | 1 refills | Status: DC

## 2016-09-01 NOTE — Progress Notes (Signed)
Pre visit review using our clinic review tool, if applicable. No additional management support is needed unless otherwise documented below in the visit note. 

## 2016-09-01 NOTE — Assessment & Plan Note (Signed)
Encouraged heart healthy diet, increase exercise, avoid trans fats, consider a krill oil cap daily 

## 2016-09-01 NOTE — Assessment & Plan Note (Signed)
>>  ASSESSMENT AND PLAN FOR HYPOTHYROIDISM WRITTEN ON 09/01/2016  2:05 PM BY BLYTH, STACEY A, MD  On Levothyroxine, continue to monitor

## 2016-09-01 NOTE — Patient Instructions (Signed)
Preventive Care 68 Years and Older, Female Preventive care refers to lifestyle choices and visits with your health care provider that can promote health and wellness. What does preventive care include?  A yearly physical exam. This is also called an annual well check.  Dental exams once or twice a year.  Routine eye exams. Ask your health care provider how often you should have your eyes checked.  Personal lifestyle choices, including:  Daily care of your teeth and gums.  Regular physical activity.  Eating a healthy diet.  Avoiding tobacco and drug use.  Limiting alcohol use.  Practicing safe sex.  Taking low-dose aspirin every day.  Taking vitamin and mineral supplements as recommended by your health care provider. What happens during an annual well check? The services and screenings done by your health care provider during your annual well check will depend on your age, overall health, lifestyle risk factors, and family history of disease. Counseling  Your health care provider may ask you questions about your:  Alcohol use.  Tobacco use.  Drug use.  Emotional well-being.  Home and relationship well-being.  Sexual activity.  Eating habits.  History of falls.  Memory and ability to understand (cognition).  Work and work environment.  Reproductive health. Screening  You may have the following tests or measurements:  Height, weight, and BMI.  Blood pressure.  Lipid and cholesterol levels. These may be checked every 5 years, or more frequently if you are over 50 years old.  Skin check.  Lung cancer screening. You may have this screening every year starting at age 55 if you have a 30-pack-year history of smoking and currently smoke or have quit within the past 15 years.  Fecal occult blood test (FOBT) of the stool. You may have this test every year starting at age 50.  Flexible sigmoidoscopy or colonoscopy. You may have a sigmoidoscopy every 5 years or  a colonoscopy every 10 years starting at age 50.  Hepatitis C blood test.  Hepatitis B blood test.  Sexually transmitted disease (STD) testing.  Diabetes screening. This is done by checking your blood sugar (glucose) after you have not eaten for a while (fasting). You may have this done every 1-3 years.  Bone density scan. This is done to screen for osteoporosis. You may have this done starting at age 68.  Mammogram. This may be done every 1-2 years. Talk to your health care provider about how often you should have regular mammograms. Talk with your health care provider about your test results, treatment options, and if necessary, the need for more tests. Vaccines  Your health care provider may recommend certain vaccines, such as:  Influenza vaccine. This is recommended every year.  Tetanus, diphtheria, and acellular pertussis (Tdap, Td) vaccine. You may need a Td booster every 10 years.  Varicella vaccine. You may need this if you have not been vaccinated.  Zoster vaccine. You may need this after age 60.  Measles, mumps, and rubella (MMR) vaccine. You may need at least one dose of MMR if you were born in 1957 or later. You may also need a second dose.  Pneumococcal 13-valent conjugate (PCV13) vaccine. One dose is recommended after age 68.  Pneumococcal polysaccharide (PPSV23) vaccine. One dose is recommended after age 68.  Meningococcal vaccine. You may need this if you have certain conditions.  Hepatitis A vaccine. You may need this if you have certain conditions or if you travel or work in places where you may be exposed to   hepatitis A.  Hepatitis B vaccine. You may need this if you have certain conditions or if you travel or work in places where you may be exposed to hepatitis B.  Haemophilus influenzae type b (Hib) vaccine. You may need this if you have certain conditions. Talk to your health care provider about which screenings and vaccines you need and how often you need  them. This information is not intended to replace advice given to you by your health care provider. Make sure you discuss any questions you have with your health care provider. Document Released: 09/28/2015 Document Revised: 05/21/2016 Document Reviewed: 07/03/2015 Elsevier Interactive Patient Education  2017 Elsevier Inc.  

## 2016-09-01 NOTE — Assessment & Plan Note (Signed)
On Levothyroxine, continue to monitor 

## 2016-09-01 NOTE — Assessment & Plan Note (Signed)
Encouraged DASH diet, decrease po intake and increase exercise as tolerated. Needs 7-8 hours of sleep nightly. Avoid trans fats, eat small, frequent meals every 4-5 hours with lean proteins, complex carbs and healthy fats. Minimize simple carbs 

## 2016-09-01 NOTE — Assessment & Plan Note (Signed)
Patient encouraged to maintain heart healthy diet, regular exercise, adequate sleep. Consider daily probiotics. Take medications as prescribed. Given and reviewed copy of ACP documents from [place] Secretary of State and encouraged to complete and return 

## 2016-09-01 NOTE — Assessment & Plan Note (Signed)
Struggling with the upcoming 5 th anniversary of her husband's death and now her 2nd husband is struggling with Parkinson's disease and her brother is dying of metastatic cancer.

## 2016-09-01 NOTE — Assessment & Plan Note (Signed)
Encouraged increased hydration, 64 ounces of clear fluids daily. Minimize alcohol and caffeine. Eat small frequent meals with lean proteins and complex carbs. Avoid high and low blood sugars. Get adequate sleep, 7-8 hours a night. Needs exercise daily preferably in the morning.  

## 2016-09-01 NOTE — Assessment & Plan Note (Signed)
Encouraged to get adequate exercise, calcium and vitamin d intake 

## 2016-09-01 NOTE — Progress Notes (Signed)
Subjective:   [name] Nolan is a 70 y.o. female who presents for Medicare Annual (Subsequent) preventive examination.  Review of Systems:  No ROS.  Medicare Wellness Visit.  Cardiac Risk Factors include: advanced age (>2men, >47 women);obesity (BMI >30kg/m2);sedentary lifestyle  Sleep patterns: has difficulty falling asleep. Takes Xanax PRN. Sleeps 6-7 hrs nightly. Sometimes gets up to void. Home Safety/Smoke Alarms: Feels safe in home. Smoke alarms in place.  Living environment; residence and Firearm Safety: Lives w/ husband who has Parkinson's and 2 dogs. 1-story house/ trailer, number of outside stairs: 8, can live on one level. Firearms stored in a locked place. Seat Belt Safety/Bike Helmet: Wears seat belt.   Counseling:   Eye Exam-  Dental- Follows w/ Dr. Nathen May every 6 months. 1 dental implant, partial plate on bottom.  Female:   Pap- N/A due to age      45- last 09/18/14, BI-RADS CATEGORY  1: Negative.      Dexa scan- last 09/18/14, low bone mass        CCS- last 10/29/15 w/ Dr. Gardiner Cellar. 1 polyp removed, mild diverticulosis, small internal hemorrhoids. 3 year recall.      Objective:     Vitals: BP 136/86 (BP Location: Left Arm, Cuff Size: Normal)   Pulse 72   Temp 99 F (37.2 C) (Oral)   Resp 16   Ht 5\' 5"  (1.651 m)   Wt 185 lb (83.9 kg)   SpO2 98%   BMI 30.79 kg/m   Body mass index is 30.79 kg/m.   Tobacco History  Smoking Status  . Never Smoker  Smokeless Tobacco  . Never Used     Counseling given: Not Answered   Past Medical History:  Diagnosis Date  . Acute bronchitis 08/15/2015  . Anxiety   . Arrhythmia 01/25/2015   Per Dr. Jaynee Eagles, [facility]; hx PVC  . Arthritis    "knees" (11/07/2015)  . Basal cell carcinoma of right ear 02/26/2015   Removed by Dr [place]  . Chronic back pain    "mid-back; stops at the very lowest part of my back" (11/07/2015)  . Constipation 02/25/2016  . Depression   . Dysphagia   .  Esophageal reflux    occ  . Fainting    fainted twice  . Family history of adverse reaction to anesthesia    "daughter gets bad PONV"  . Heart murmur   . Hot flashes 05/27/2016  . Hyperlipidemia   . Hypothyroid   . Insomnia   . Low ferritin 08/27/2015   "took supplements for awhile" (11/07/2015)  . Medicare annual wellness visit, subsequent 08/27/2015  . Menopause   . Migraine    "under control w/daily RX right now" (11/07/2015)  . Occipital neuralgia   . Osteopenia 02/26/2015  . Osteoporosis    osteopenia  . Preventative health care 08/27/2015  . Prolonged depressive reaction   . Vitamin B12 deficiency 09/01/2016   Past Surgical History:  Procedure Laterality Date  . APPENDECTOMY  11/07/2015  . BASAL CELL CARCINOMA EXCISION Right 02/26/2015   ear  . CHOLECYSTECTOMY OPEN  1974  . COLONOSCOPY  2006  . LAPAROSCOPIC APPENDECTOMY N/A 11/07/2015   Procedure: APPENDECTOMY LAPAROSCOPIC;  Surgeon: Georganna Skeans, MD;  Location: [facility];  Service: General;  Laterality: N/A;  . LAPAROSCOPIC INCISIONAL / UMBILICAL / Hand  11/07/2015   UHR  . Tooth implant     at least 5 years ago per pt  . TUBAL LIGATION  1973  .  UMBILICAL HERNIA REPAIR N/A 11/07/2015   Procedure: LAPAROSCOPIC UMBILICAL HERNIA;  Surgeon: Georganna Skeans, MD;  Location: [facility] OR;  Service: General;  Laterality: N/A;   Family History  Problem Relation Age of Onset  . Congestive Heart Failure Mother   . Leukemia Father   . Kidney disease Brother   . Cancer Brother     stage 4 kidney cancer, metastatic  . Kidney cancer Brother   . Leukemia Maternal Aunt   . Congestive Heart Failure Maternal Grandmother   . Arthritis Sister   . Colon cancer Neg Hx    History  Sexual Activity  . Sexual activity: Yes  . Birth control/ protection: Post-menopausal    Comment: lives with husband, no dietary restrictions, avoids caffeine, bananas, dairy    Outpatient Encounter Prescriptions as of 09/01/2016  Medication Sig    . ALPRAZolam (XANAX) 1 MG tablet Take 1/2 or one whole tablet hs prn sleep  . B Complex Vitamins (B-COMPLEX/B-12 PO) Take 2,500 mg by mouth daily.   . botulinum toxin Type A (BOTOX) 100 units SOLR injection To be administered by provider in office to facial and neck muscles every three months.  . Calcium Carbonate 1500 (600 CA) MG TABS Take 600 mg of elemental calcium by mouth daily with breakfast.   . cyclobenzaprine (FLEXERIL) 10 MG tablet Take 1 tablet (10 mg total) by mouth at bedtime. Can also take 1/2 to one pill twice daily for headache.  . levothyroxine (SYNTHROID, LEVOTHROID) 75 MCG tablet Take 75 mcg by mouth daily before breakfast. Reported on 10/25/2015  . NON FORMULARY Take by mouth once. [facility] probiotic  . rizatriptan (MAXALT-MLT) 10 MG disintegrating tablet Take 1 tablet (10 mg total) by mouth as needed for migraine. May repeat in 2 hours if needed  . venlafaxine XR (EFFEXOR XR) 75 MG 24 hr capsule Take 1 capsule (75 mg total) by mouth daily with breakfast.  . [DISCONTINUED] traMADol (ULTRAM) 50 MG tablet Take 1 tablet (50 mg total) by mouth every 6 (six) hours as needed.  . [DISCONTINUED] venlafaxine XR (EFFEXOR XR) 75 MG 24 hr capsule Take 1 capsule (75 mg total) by mouth daily with breakfast.   No facility-administered encounter medications on file as of 09/01/2016.     Activities of Daily Living In your present state of health, do you have any difficulty performing the following activities: 09/01/2016 11/07/2015  Hearing? N -  Vision? N -  Difficulty concentrating or making decisions? N -  Walking or climbing stairs? N -  Dressing or bathing? N -  Doing errands, shopping? N N  Preparing Food and eating ? N -  Using the Toilet? N -  In the past six months, have you accidently leaked urine? N -  Do you have problems with loss of bowel control? N -  Managing your Medications? N -  Managing your Finances? N -  Housekeeping or managing your Housekeeping? N -  Some recent  data might be hidden    Patient Care Team: Mosie Lukes, MD as PCP - General (Family Medicine) Melvenia Beam, MD as Consulting Physician (Neurology) Delrae Rend, MD as Consulting Physician (Endocrinology) Lavonna Monarch, MD as Consulting Physician (Dermatology)    Assessment:    Physical assessment deferred to PCP.  Exercise Activities and Dietary recommendations Current Exercise Habits: Home exercise routine, Type of exercise: walking, Time (Minutes): 30, Frequency (Times/Week): 7, Weekly Exercise (Minutes/Week): 210  Diet (meal preparation, eat out, water intake, caffeinated beverages, dairy products, fruits and  vegetables): Eats vegetables and meat. Meals vary. Maybe beans and cornbread w/ onion. Able to cook, but goes out to eat frequently; eats lots of salads.  Goals    . Lose 10 lbs by next year.   (pt-stated)      Fall Risk Fall Risk  09/01/2016 08/27/2015 05/29/2015 05/16/2014  Falls in the past year? No - No No  Risk for fall due to : - History of fall(s) History of fall(s) -   Depression Screen PHQ 2/9 Scores 09/01/2016 08/27/2015 05/29/2015 05/16/2014  PHQ - 2 Score 2 1 0 2  PHQ- 9 Score 6 - - 4  Exception Documentation - - Patient refusal -     Cognitive Function MMSE - Mini Mental State Exam 09/01/2016  Orientation to time 5  Orientation to Place 5  Registration 3  Attention/ Calculation 5  Recall 3  Language- name 2 objects 2  Language- repeat 1  Language- follow 3 step command 3  Language- read & follow direction 1  Write a sentence 1  Copy design 1  Total score 30        Immunization History  Administered Date(s) Administered  . Influenza,inj,Quad PF,36+ Mos 05/29/2015  . Influenza-Unspecified 05/16/2014, 04/24/2016  . Pneumococcal Conjugate-13 09/04/2014  . Pneumococcal Polysaccharide-23 08/27/2015  . Tdap 08/24/2014  . Zoster 08/28/2014   Screening Tests Health Maintenance  Topic Date Due  . MAMMOGRAM  09/18/2016  . COLON CANCER  SCREENING ANNUAL FOBT  10/28/2016  . COLONOSCOPY  10/28/2018  . TETANUS/TDAP  08/24/2024  . INFLUENZA VACCINE  Completed  . DEXA SCAN  Completed  . ZOSTAVAX  Addressed  . Hepatitis C Screening  Completed  . PNA vac Low Risk Adult  Completed      Plan:    Follow-up w/ PCP as directed. Bring a copy of your advance directives to your next office visit.  During the course of the visit the patient was educated and counseled about the following appropriate screening and preventive services:   Vaccines to include Pneumoccal, Influenza, Hepatitis B, Td, Zostavax, HCV  Cardiovascular Disease  Colorectal cancer screening  Bone density screening  Diabetes screening  Glaucoma screening  Mammography/PAP  Nutrition counseling   Patient Instructions (the written plan) was given to the patient.   Dorrene German, RN  09/01/2016

## 2016-09-01 NOTE — Assessment & Plan Note (Signed)
>>  ASSESSMENT AND PLAN FOR OBESITY WRITTEN ON 09/01/2016  2:06 PM BY BLYTH, STACEY A, MD  Encouraged DASH diet, decrease po intake and increase exercise as tolerated. Needs 7-8 hours of sleep nightly. Avoid trans fats, eat small, frequent meals every 4-5 hours with lean proteins, complex carbs and healthy fats. Minimize simple carbs

## 2016-09-01 NOTE — Progress Notes (Signed)
Subjective:    Patient ID: [name] Nolan, female    DOB: 11-17-1945, 70 y.o.   MRN: [id number]  Chief Complaint  Patient presents with  . Annual Exam  . Medicare Wellness    HPI Patient is in today for annual preventative exam and follow up on numerous medical concerns. She is having a good response to the Botox injections. Gets headaches very infrequently. Is under a great deal of stress with a second husband with Parkinson's disease and hearing loss. She is getting increasingly isolated. Struggles. Has had 2 weeks of URI symptoms with low grade fever, malaise, cough productive of yellow phlegm. Is improving now. Used Mucinex DM with good results Denies CP/palp/SOB//GI or GU c/o. Taking meds as prescribed  Past Medical History:  Diagnosis Date  . Acute bronchitis 08/15/2015  . Anxiety   . Arrhythmia 01/25/2015   Per Dr. Jaynee Eagles, [facility]; hx PVC  . Arthritis    "knees" (11/07/2015)  . Basal cell carcinoma of right ear 02/26/2015   Removed by Dr [place]  . Chronic back pain    "mid-back; stops at the very lowest part of my back" (11/07/2015)  . Constipation 02/25/2016  . Depression   . Dysphagia   . Esophageal reflux    occ  . Fainting    fainted twice  . Family history of adverse reaction to anesthesia    "daughter gets bad PONV"  . Heart murmur   . Hot flashes 05/27/2016  . Hyperlipidemia   . Hypothyroid   . Insomnia   . Low ferritin 08/27/2015   "took supplements for awhile" (11/07/2015)  . Medicare annual wellness visit, subsequent 08/27/2015  . Menopause   . Migraine    "under control w/daily RX right now" (11/07/2015)  . Occipital neuralgia   . Osteopenia 02/26/2015  . Osteoporosis    osteopenia  . Preventative health care 08/27/2015  . Prolonged depressive reaction   . Vitamin B12 deficiency 09/01/2016    Past Surgical History:  Procedure Laterality Date  . APPENDECTOMY  11/07/2015  . BASAL CELL CARCINOMA EXCISION Right 02/26/2015   ear  .  CHOLECYSTECTOMY OPEN  1974  . COLONOSCOPY  2006  . LAPAROSCOPIC APPENDECTOMY N/A 11/07/2015   Procedure: APPENDECTOMY LAPAROSCOPIC;  Surgeon: Georganna Skeans, MD;  Location: [facility];  Service: General;  Laterality: N/A;  . LAPAROSCOPIC INCISIONAL / UMBILICAL / [place]  11/07/2015   UHR  . Tooth implant     at least 5 years ago per pt  . TUBAL LIGATION  1973  . UMBILICAL HERNIA REPAIR N/A 11/07/2015   Procedure: LAPAROSCOPIC UMBILICAL HERNIA;  Surgeon: Georganna Skeans, MD;  Location: [facility] OR;  Service: General;  Laterality: N/A;    Family History  Problem Relation Age of Onset  . Congestive Heart Failure Mother   . Leukemia Father   . Kidney disease Brother   . Cancer Brother     stage 4 kidney cancer, metastatic  . Kidney cancer Brother   . Leukemia Maternal Aunt   . Congestive Heart Failure Maternal Grandmother   . Arthritis Sister   . Colon cancer Neg Hx     Social History   Social History  . Marital status: Married    Spouse name: [name]  . Number of children: 1  . Years of education: HS   Occupational History  . Retired    Social History Main Topics  . Smoking status: Never Smoker  . Smokeless tobacco: Never Used  . Alcohol use No  .  Drug use: No  . Sexual activity: Yes    Birth control/ protection: Post-menopausal     Comment: lives with husband, no dietary restrictions, avoids caffeine, bananas, dairy   Other Topics Concern  . Not on file   Social History Narrative   Patient is married [name]) and lives at home with her husband.   Patient has one child.   Patient has a high school education.   Patient is right-handed.   Caffeine Use: Occasionally    Outpatient Medications Prior to Visit  Medication Sig Dispense Refill  . ALPRAZolam (XANAX) 1 MG tablet Take 1/2 or one whole tablet hs prn sleep 30 tablet 0  . B Complex Vitamins (B-COMPLEX/B-12 PO) Take 2,500 mg by mouth daily.     . botulinum toxin Type A (BOTOX) 100 units SOLR injection To be  administered by provider in office to facial and neck muscles every three months. 2 vial 3  . Calcium Carbonate 1500 (600 CA) MG TABS Take 600 mg of elemental calcium by mouth daily with breakfast.     . cyclobenzaprine (FLEXERIL) 10 MG tablet Take 1 tablet (10 mg total) by mouth at bedtime. Can also take 1/2 to one pill twice daily for headache. 90 tablet 11  . levothyroxine (SYNTHROID, LEVOTHROID) 75 MCG tablet Take 75 mcg by mouth daily before breakfast. Reported on 10/25/2015    . NON FORMULARY Take by mouth once. Phillips probiotic    . rizatriptan (MAXALT-MLT) 10 MG disintegrating tablet Take 1 tablet (10 mg total) by mouth as needed for migraine. May repeat in 2 hours if needed 15 tablet 11  . traMADol (ULTRAM) 50 MG tablet Take 1 tablet (50 mg total) by mouth every 6 (six) hours as needed. 30 tablet 3  . venlafaxine XR (EFFEXOR XR) 75 MG 24 hr capsule Take 1 capsule (75 mg total) by mouth daily with breakfast. 90 capsule 1   No facility-administered medications prior to visit.     Allergies  Allergen Reactions  . Statins Other (See Comments)    Muscle pain  . Codeine Nausea And Vomiting    Review of Systems  Constitutional: Negative for fever.  Eyes: Negative for blurred vision.  Respiratory: Negative for cough and shortness of breath.   Cardiovascular: Negative for chest pain and palpitations.  Gastrointestinal: Negative for vomiting.  Musculoskeletal: Negative for back pain.  Skin: Negative for rash.  Neurological: Positive for headaches. Negative for loss of consciousness.  Psychiatric/Behavioral: Positive for depression. The patient is nervous/anxious.        Objective:    Physical Exam  Constitutional: She is oriented to person, place, and time. She appears well-developed and well-nourished. No distress.  HENT:  Head: Normocephalic and atraumatic.  Eyes: Conjunctivae are normal.  Neck: Normal range of motion. No thyromegaly present.  Cardiovascular: Normal rate and  regular rhythm.   Pulmonary/Chest: Effort normal and breath sounds normal. She has no wheezes.  Abdominal: Soft. Bowel sounds are normal. There is no tenderness.  Musculoskeletal: Normal range of motion. She exhibits no edema or deformity.  Neurological: She is alert and oriented to person, place, and time.  Skin: Skin is warm and dry. She is not diaphoretic.  Psychiatric: She has a normal mood and affect.    BP 136/86 (BP Location: Left Arm, Cuff Size: Normal)   Pulse 72   Temp 99 F (37.2 C) (Oral)   Resp 16   Ht 5\' 5"  (1.651 m)   Wt 185 lb (83.9 kg)  SpO2 98%   BMI 30.79 kg/m  Wt Readings from Last 3 Encounters:  09/01/16 185 lb (83.9 kg)  08/26/16 186 lb 12.8 oz (84.7 kg)  05/27/16 184 lb 3.2 oz (83.6 kg)     Lab Results  Component Value Date   WBC 5.3 09/01/2016   HGB 10.8 (L) 09/01/2016   HCT 33.5 (L) 09/01/2016   PLT 348.0 09/01/2016   GLUCOSE 97 09/01/2016   CHOL 194 09/01/2016   TRIG 104.0 09/01/2016   HDL 57.10 09/01/2016   LDLCALC 116 (H) 09/01/2016   ALT 19 09/01/2016   AST 31 09/01/2016   NA 141 09/01/2016   K 4.0 09/01/2016   CL 107 09/01/2016   CREATININE 0.90 09/01/2016   BUN 11 09/01/2016   CO2 28 09/01/2016   TSH 1.29 09/01/2016    Lab Results  Component Value Date   TSH 1.29 09/01/2016   Lab Results  Component Value Date   WBC 5.3 09/01/2016   HGB 10.8 (L) 09/01/2016   HCT 33.5 (L) 09/01/2016   MCV 77.1 (L) 09/01/2016   PLT 348.0 09/01/2016   Lab Results  Component Value Date   NA 141 09/01/2016   K 4.0 09/01/2016   CO2 28 09/01/2016   GLUCOSE 97 09/01/2016   BUN 11 09/01/2016   CREATININE 0.90 09/01/2016   BILITOT 0.4 09/01/2016   ALKPHOS 79 09/01/2016   AST 31 09/01/2016   ALT 19 09/01/2016   PROT 7.0 09/01/2016   ALBUMIN 4.1 09/01/2016   CALCIUM 9.5 09/01/2016   ANIONGAP 8 11/06/2015   GFR 65.99 09/01/2016   Lab Results  Component Value Date   CHOL 194 09/01/2016   Lab Results  Component Value Date   HDL 57.10  09/01/2016   Lab Results  Component Value Date   LDLCALC 116 (H) 09/01/2016   Lab Results  Component Value Date   TRIG 104.0 09/01/2016   Lab Results  Component Value Date   CHOLHDL 3 09/01/2016   No results found for: HGBA1C  I acted as a Education administrator for Dr. Charlett Blake.  Guerry Bruin, [place].     Assessment & Plan:   Problem List Items Addressed This Visit    Hyperlipidemia    Encouraged heart healthy diet, increase exercise, avoid trans fats, consider a krill oil cap daily      Relevant Orders   Lipid panel (Completed)   Hypothyroidism    On Levothyroxine, continue to monitor      Relevant Orders   Comprehensive metabolic panel (Completed)   Lipid panel (Completed)   TSH (Completed)   Morning headache   Relevant Medications   venlafaxine XR (EFFEXOR XR) 75 MG 24 hr capsule   Other Relevant Orders   CBC (Completed)   Comprehensive metabolic panel (Completed)   Arrhythmia   Vitamin B12 deficiency   Relevant Orders   Vitamin B12 (Completed)    Other Visit Diagnoses    Encounter for Medicare annual wellness exam    -  Primary      I have discontinued Ms. Stovall-Edwards's traMADol. I am also having her maintain her levothyroxine, B Complex Vitamins (B-COMPLEX/B-12 PO), calcium carbonate, NON FORMULARY, rizatriptan, cyclobenzaprine, botulinum toxin Type A, ALPRAZolam, and venlafaxine XR.  Meds ordered this encounter  Medications  . venlafaxine XR (EFFEXOR XR) 75 MG 24 hr capsule    Sig: Take 1 capsule (75 mg total) by mouth daily with breakfast.    Dispense:  90 capsule    Refill:  1    CMA  served as Education administrator in visit. Interview, physical exam and plan are performed by me. documentation reviewed and attested to Penni Homans, MD

## 2016-09-01 NOTE — Assessment & Plan Note (Signed)
>>  ASSESSMENT AND PLAN FOR MIGRAINE WITHOUT AURA WRITTEN ON 09/01/2016  2:08 PM BY BLYTH, STACEY A, MD  Encouraged increased hydration, 64 ounces of clear fluids daily. Minimize alcohol and caffeine. Eat small frequent meals with lean proteins and complex carbs. Avoid high and low blood sugars. Get adequate sleep, 7-8 hours a night. Needs exercise daily preferably in the morning.

## 2016-09-02 ENCOUNTER — Other Ambulatory Visit: Payer: Self-pay | Admitting: Family Medicine

## 2016-09-02 DIAGNOSIS — D649 Anemia, unspecified: Secondary | ICD-10-CM

## 2016-09-02 DIAGNOSIS — K625 Hemorrhage of anus and rectum: Secondary | ICD-10-CM

## 2016-09-02 LAB — LIPID PANEL
CHOLESTEROL: 194 mg/dL (ref 0–200)
HDL: 57.1 mg/dL (ref >39.00)
LDL Cholesterol: 116 mg/dL — ABNORMAL HIGH (ref 0–99)
NONHDL: 136.76
Total CHOL/HDL Ratio: 3
Triglycerides: 104 mg/dL (ref 0.0–149.0)
VLDL: 20.8 mg/dL (ref 0.0–40.0)

## 2016-09-02 LAB — CBC
HEMATOCRIT: 33.5 % — AB (ref 36.0–46.0)
Hemoglobin: 10.8 g/dL — ABNORMAL LOW (ref 12.0–15.0)
MCHC: 32.2 g/dL (ref 30.0–36.0)
MCV: 77.1 fl — ABNORMAL LOW (ref 78.0–100.0)
Platelets: 348 10*3/uL (ref 150.0–400.0)
RBC: 4.34 Mil/uL (ref 3.87–5.11)
RDW: 16.9 % — AB (ref 11.5–15.5)
WBC: 5.3 10*3/uL (ref 4.0–10.5)

## 2016-09-02 LAB — COMPREHENSIVE METABOLIC PANEL
ALBUMIN: 4.1 g/dL (ref 3.5–5.2)
ALK PHOS: 79 U/L (ref 39–117)
ALT: 19 U/L (ref 0–35)
AST: 31 U/L (ref 0–37)
BILIRUBIN TOTAL: 0.4 mg/dL (ref 0.2–1.2)
BUN: 11 mg/dL (ref 6–23)
CO2: 28 mEq/L (ref 19–32)
CREATININE: 0.9 mg/dL (ref 0.40–1.20)
Calcium: 9.5 mg/dL (ref 8.4–10.5)
Chloride: 107 mEq/L (ref 96–112)
GFR: 65.99 mL/min (ref >60.00)
GLUCOSE: 97 mg/dL (ref 70–99)
Potassium: 4 mEq/L (ref 3.5–5.1)
SODIUM: 141 meq/L (ref 135–145)
TOTAL PROTEIN: 7 g/dL (ref 6.0–8.3)

## 2016-09-02 LAB — VITAMIN B12: Vitamin B-12: 223 pg/mL (ref 211–911)

## 2016-09-02 LAB — TSH: TSH: 1.29 u[IU]/mL (ref 0.35–4.50)

## 2016-09-02 MED ORDER — FERROUS FUMARATE 324 (106 FE) MG PO TABS: 1.0000 | ORAL_TABLET | Freq: Every day | ORAL | 3 refills | Status: DC

## 2016-09-11 ENCOUNTER — Encounter: Payer: Self-pay | Admitting: Neurology

## 2016-09-13 ENCOUNTER — Encounter: Payer: Self-pay | Admitting: Neurology

## 2016-09-16 ENCOUNTER — Encounter: Payer: Self-pay | Admitting: Family Medicine

## 2016-09-17 ENCOUNTER — Other Ambulatory Visit (INDEPENDENT_AMBULATORY_CARE_PROVIDER_SITE_OTHER): Payer: Medicare Other

## 2016-09-17 DIAGNOSIS — K625 Hemorrhage of anus and rectum: Secondary | ICD-10-CM

## 2016-09-17 DIAGNOSIS — D649 Anemia, unspecified: Secondary | ICD-10-CM | POA: Diagnosis not present

## 2016-09-17 LAB — FECAL OCCULT BLOOD, IMMUNOCHEMICAL: Fecal Occult Bld: NEGATIVE

## 2016-09-17 NOTE — Telephone Encounter (Signed)
I called and spoke with the patient to discuss her previous injection and the payment. She said that her insurance sent another letter advising that they did pay it. The patient's medication came through speciality pharmacy so it had been paid before her apt. She said that she was worried about her next apt because they told her it would run out on March 8th and her apt was the 15th. She was referring to the PA, and I reassured her that his would be no problem because we would request a new PA before her next apt and she could keep the apt she has scheduled.

## 2016-09-17 NOTE — Telephone Encounter (Signed)
Called and spoke with wife since she had not read mychart message yet. I relayed my mychart message from earlier. She is going to set up separate mychart for husband to avoid confusion.   She would like rx sinemet 25-100mg  1 tab 4 times daily sent to [facility] because this will be covered there for them. Advised I will have WID sign rx since AA,MD out of office today and fax for them. She verbalized understanding.

## 2016-09-18 ENCOUNTER — Other Ambulatory Visit: Payer: Self-pay | Admitting: Family Medicine

## 2016-09-18 NOTE — Telephone Encounter (Signed)
Last refill  08/18/2016  #30 with 0 refills Last office visit  05/27/2016

## 2016-09-18 NOTE — Telephone Encounter (Signed)
Faxed hardcopy for Alprazolam to [facility] in [place]

## 2016-09-23 ENCOUNTER — Other Ambulatory Visit (INDEPENDENT_AMBULATORY_CARE_PROVIDER_SITE_OTHER): Payer: Medicare Other

## 2016-09-23 DIAGNOSIS — D649 Anemia, unspecified: Secondary | ICD-10-CM

## 2016-09-23 LAB — CBC
HCT: 36.3 % (ref 36.0–46.0)
HEMOGLOBIN: 11.9 g/dL — AB (ref 12.0–15.0)
MCHC: 32.8 g/dL (ref 30.0–36.0)
MCV: 79.2 fl (ref 78.0–100.0)
Platelets: 286 10*3/uL (ref 150.0–400.0)
RBC: 4.58 Mil/uL (ref 3.87–5.11)
RDW: 21 % — ABNORMAL HIGH (ref 11.5–15.5)
WBC: 4 10*3/uL (ref 4.0–10.5)

## 2016-09-24 NOTE — Progress Notes (Signed)
RN AWV note reviewed. Agree with documention and plan. 

## 2016-10-01 IMAGING — US US SOFT TISSUE HEAD/NECK
1 series · 14 of 25 positions shown · non-contrast
Comparison: Ultrasound March 14, 2013.

CLINICAL DATA: Hypothyroidism.

EXAM:
THYROID ULTRASOUND
TECHNIQUE: Ultrasound examination of the thyroid gland and adjacent soft
tissues was performed.

[Series 1: us soft tissue head/neck · 0.06mm/px · 14 of 26 slices shown]
[im 1/26]
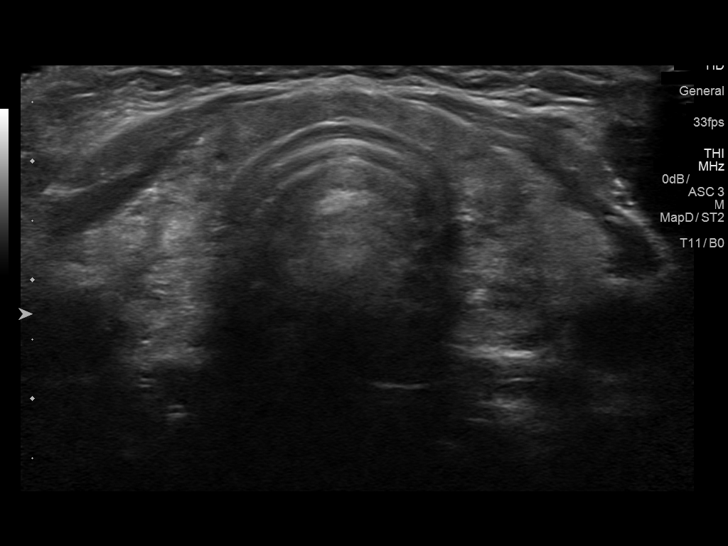
[im 3/26]
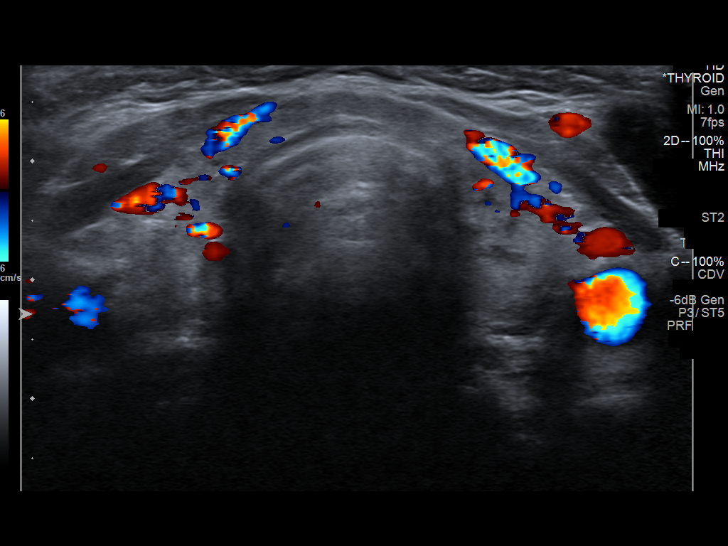
[im 5/26]
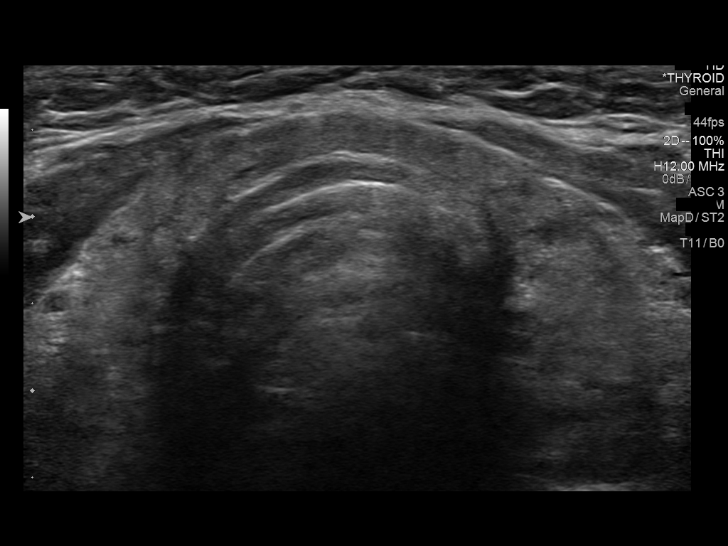
[im 7/26]
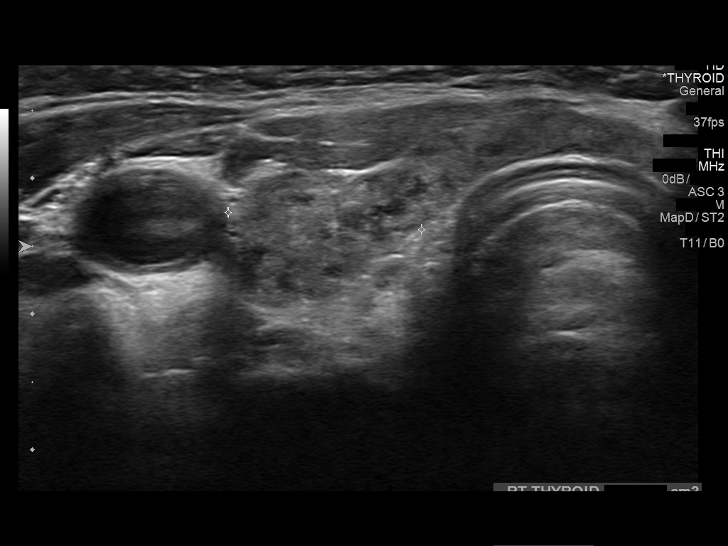
[im 9/26]
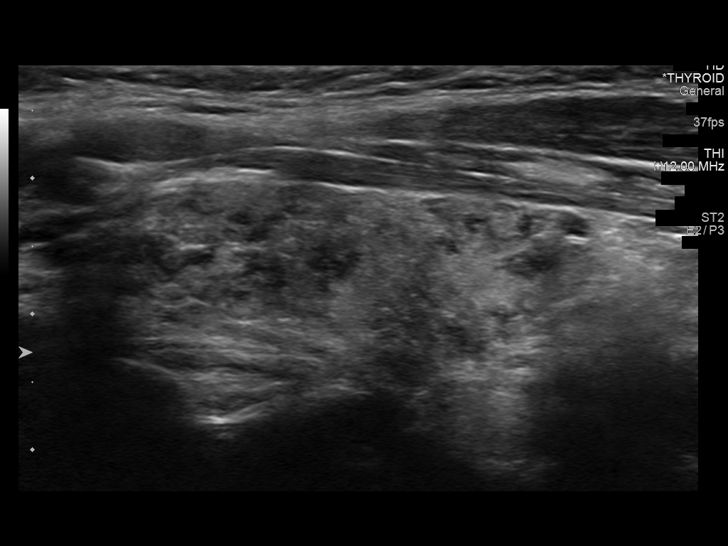
[im 10/26]
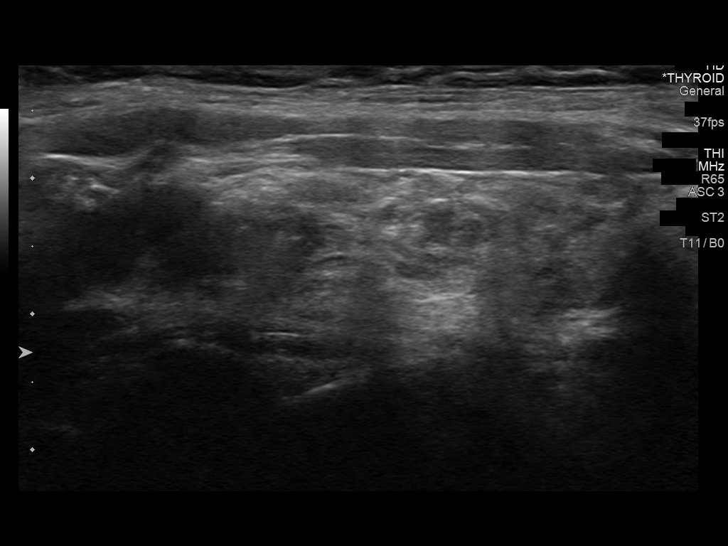
[im 12/26]
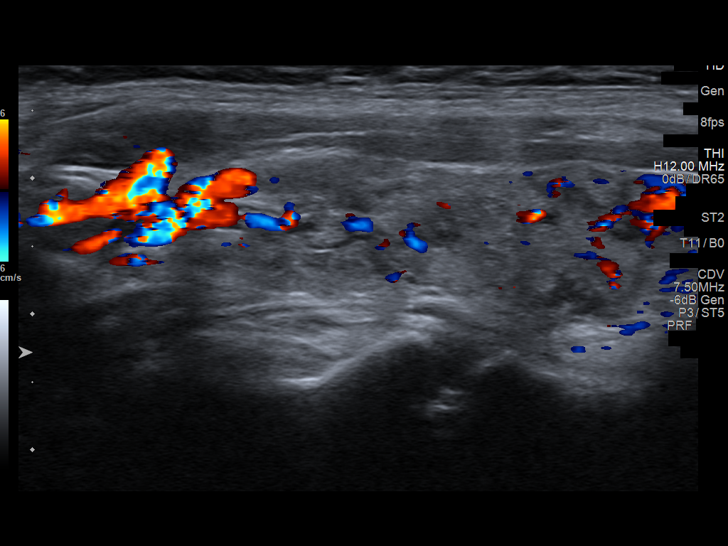
[im 14/26]
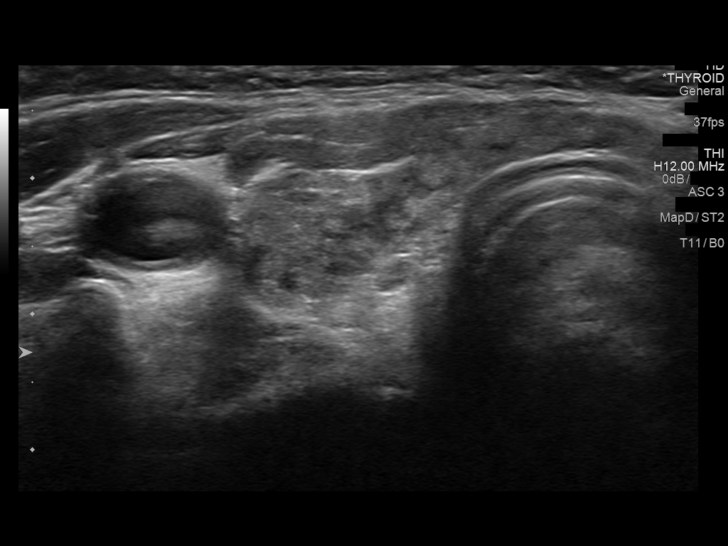
[im 16/26]
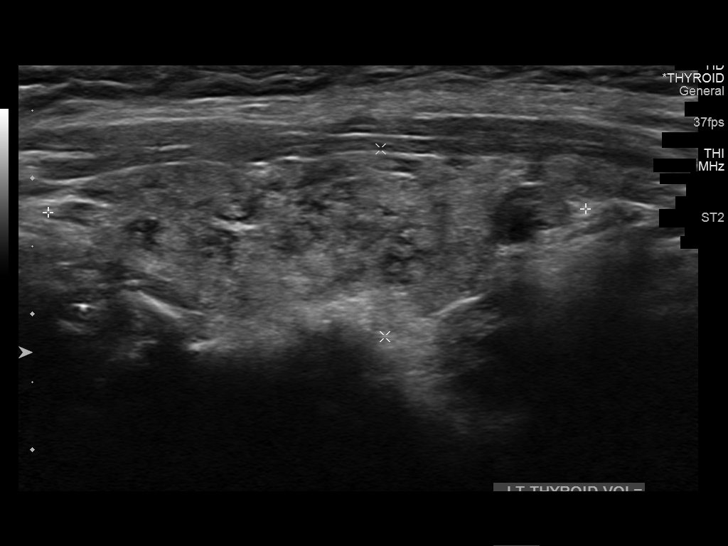
[im 17/26]
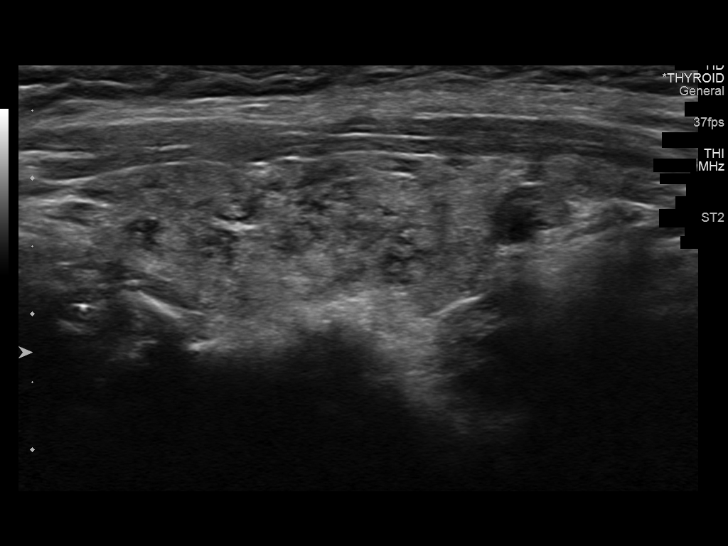
[im 19/26]
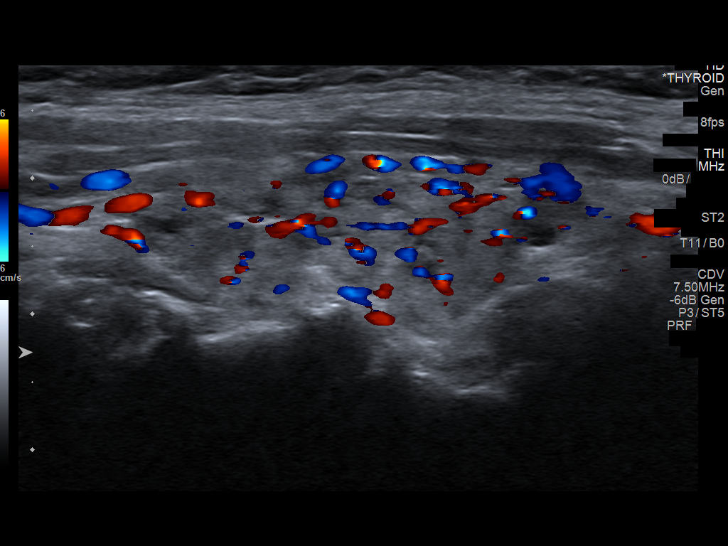
[im 21/26]
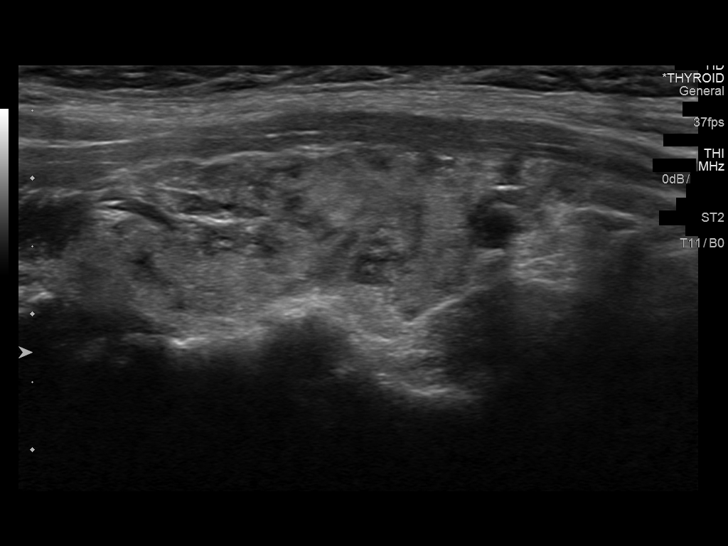
[im 23/26]
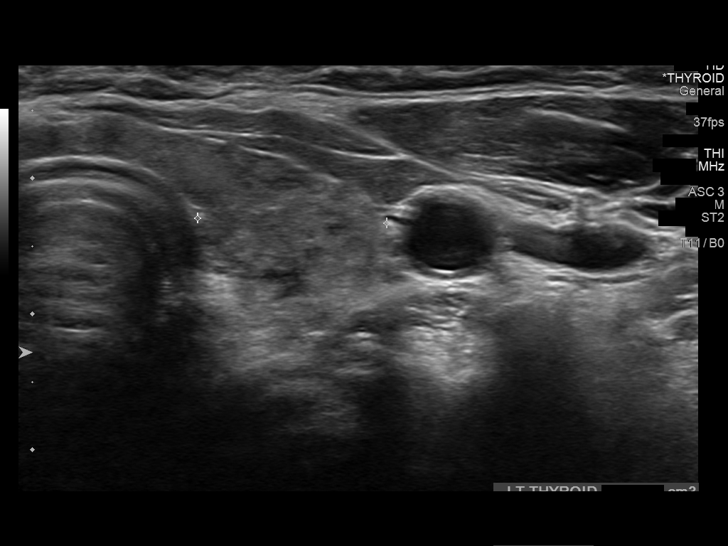
[im 26/26]
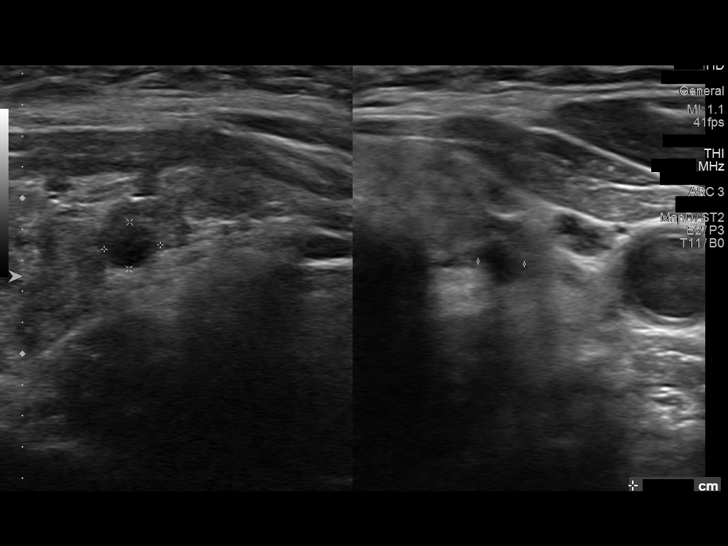

[14 of 25 positions shown; findings below may reference images not displayed]

FINDINGS: Right thyroid lobe

Measurements: 3.7 x 1.4 x 1.4 cm. Diffusely heterogeneous
echotexture is noted which is not significantly changed.

Left thyroid lobe

Measurements: 4.0 x 1.4 x 1.4 cm. 4 mm cystic nodule is noted in
inferior pole. Diffusely heterogeneous echotexture is noted which is
not significantly changed.

Isthmus

Thickness: 2 mm.  No nodules visualized.

Lymphadenopathy

None visualized.
IMPRESSION: Diffusely heterogeneous echotexture is noted throughout both thyroid
lobes which is not significantly changed compared to prior exam,
suggesting possible chronic thyroiditis. 4 mm hypoechoic nodule is
seen in lower pole of left thyroid lobe. No dominant mass or nodule
is noted.

## 2016-10-15 ENCOUNTER — Encounter: Payer: Self-pay | Admitting: Family Medicine

## 2016-10-16 ENCOUNTER — Other Ambulatory Visit: Payer: Self-pay | Admitting: Family Medicine

## 2016-10-16 MED ORDER — ALPRAZOLAM 1 MG PO TABS: ORAL_TABLET | ORAL | 1 refills | Status: DC

## 2016-11-27 ENCOUNTER — Ambulatory Visit (INDEPENDENT_AMBULATORY_CARE_PROVIDER_SITE_OTHER): Payer: Medicare Other | Admitting: Neurology

## 2016-11-27 ENCOUNTER — Telehealth: Payer: Self-pay

## 2016-11-27 ENCOUNTER — Encounter: Payer: Self-pay | Admitting: Neurology

## 2016-11-27 VITALS — BP 130/83 | HR 71 | Wt 189.2 lb

## 2016-11-27 DIAGNOSIS — G43719 Chronic migraine without aura, intractable, without status migrainosus: Secondary | ICD-10-CM | POA: Diagnosis not present

## 2016-11-27 NOTE — Telephone Encounter (Signed)
Pt no-showed her Botox appt today.

## 2016-11-27 NOTE — Progress Notes (Signed)

## 2016-12-15 ENCOUNTER — Encounter: Payer: Self-pay | Admitting: Family Medicine

## 2016-12-15 ENCOUNTER — Other Ambulatory Visit: Payer: Self-pay | Admitting: Family Medicine

## 2016-12-15 NOTE — Telephone Encounter (Signed)
Requesting:   alprazolam Contract   None UDS    NONe Last OV    09/01/2016----next appt is on 03/02/2017 Last Refill    #30 with 1 refill on 10/16/2016  Please Advise

## 2016-12-29 ENCOUNTER — Other Ambulatory Visit: Payer: Self-pay | Admitting: Neurology

## 2017-01-07 ENCOUNTER — Other Ambulatory Visit: Payer: Self-pay | Admitting: Family Medicine

## 2017-01-07 ENCOUNTER — Encounter: Payer: Self-pay | Admitting: Family Medicine

## 2017-01-07 DIAGNOSIS — E039 Hypothyroidism, unspecified: Secondary | ICD-10-CM

## 2017-01-26 ENCOUNTER — Encounter: Payer: Self-pay | Admitting: Neurology

## 2017-01-26 ENCOUNTER — Telehealth: Payer: Self-pay | Admitting: Neurology

## 2017-01-26 NOTE — Telephone Encounter (Signed)
PPer pt request, 4:30 Botox appt r/s to 3:00 after her husband's appt w/ Dr. Jaynee Eagles on 6/28.

## 2017-01-26 NOTE — Telephone Encounter (Signed)
Patient called and want to know if she could have her injections right after her husbands apt at 2:00. Please call and advise.

## 2017-02-05 ENCOUNTER — Other Ambulatory Visit: Payer: Self-pay | Admitting: Neurology

## 2017-02-05 DIAGNOSIS — G43001 Migraine without aura, not intractable, with status migrainosus: Secondary | ICD-10-CM

## 2017-02-11 ENCOUNTER — Encounter: Payer: Self-pay | Admitting: Family Medicine

## 2017-02-12 ENCOUNTER — Encounter: Payer: Self-pay | Admitting: Family Medicine

## 2017-02-12 MED ORDER — ALPRAZOLAM 1 MG PO TABS: ORAL_TABLET | ORAL | 0 refills | Status: DC

## 2017-02-12 NOTE — Addendum Note (Signed)
Addended by: Sharon Seller B on: 02/12/2017 07:12 AM   Modules accepted: Orders

## 2017-02-18 DIAGNOSIS — E038 Other specified hypothyroidism: Secondary | ICD-10-CM | POA: Insufficient documentation

## 2017-03-02 ENCOUNTER — Ambulatory Visit (INDEPENDENT_AMBULATORY_CARE_PROVIDER_SITE_OTHER): Payer: Medicare Other | Admitting: Family Medicine

## 2017-03-02 ENCOUNTER — Encounter: Payer: Self-pay | Admitting: Family Medicine

## 2017-03-02 DIAGNOSIS — M8580 Other specified disorders of bone density and structure, unspecified site: Secondary | ICD-10-CM

## 2017-03-02 DIAGNOSIS — E039 Hypothyroidism, unspecified: Secondary | ICD-10-CM | POA: Diagnosis not present

## 2017-03-02 DIAGNOSIS — E669 Obesity, unspecified: Secondary | ICD-10-CM

## 2017-03-02 DIAGNOSIS — M170 Bilateral primary osteoarthritis of knee: Secondary | ICD-10-CM | POA: Diagnosis not present

## 2017-03-02 DIAGNOSIS — M1710 Unilateral primary osteoarthritis, unspecified knee: Secondary | ICD-10-CM

## 2017-03-02 DIAGNOSIS — E785 Hyperlipidemia, unspecified: Secondary | ICD-10-CM | POA: Diagnosis not present

## 2017-03-02 DIAGNOSIS — R790 Abnormal level of blood mineral: Secondary | ICD-10-CM

## 2017-03-02 DIAGNOSIS — E559 Vitamin D deficiency, unspecified: Secondary | ICD-10-CM | POA: Diagnosis not present

## 2017-03-02 DIAGNOSIS — F418 Other specified anxiety disorders: Secondary | ICD-10-CM | POA: Diagnosis not present

## 2017-03-02 DIAGNOSIS — K5900 Constipation, unspecified: Secondary | ICD-10-CM

## 2017-03-02 DIAGNOSIS — E538 Deficiency of other specified B group vitamins: Secondary | ICD-10-CM

## 2017-03-02 DIAGNOSIS — M179 Osteoarthritis of knee, unspecified: Secondary | ICD-10-CM

## 2017-03-02 DIAGNOSIS — Z8639 Personal history of other endocrine, nutritional and metabolic disease: Secondary | ICD-10-CM | POA: Insufficient documentation

## 2017-03-02 HISTORY — DX: Unilateral primary osteoarthritis, unspecified knee: M17.10

## 2017-03-02 HISTORY — DX: Vitamin D deficiency, unspecified: E55.9

## 2017-03-02 HISTORY — DX: Osteoarthritis of knee, unspecified: M17.9

## 2017-03-02 NOTE — Assessment & Plan Note (Signed)
Taking SL Vitamin B 12 check level

## 2017-03-02 NOTE — Assessment & Plan Note (Signed)
Check vitamin D level today 

## 2017-03-02 NOTE — Patient Instructions (Signed)
maintain Constipation, Adult Constipation is when a person:  Poops (has a bowel movement) fewer times in a week than normal.  Has a hard time pooping.  Has poop that is dry, hard, or bigger than normal.  Follow these instructions at home: Eating and drinking   Eat foods that have a lot of fiber, such as: ? Fresh fruits and vegetables. ? Whole grains. ? Beans.  Eat less of foods that are high in fat, low in fiber, or overly processed, such as: ? [place] fries. ? Hamburgers. ? Cookies. ? Candy. ? Soda.  Drink enough fluid to keep your pee (urine) clear or pale yellow. General instructions  Exercise regularly or as told by your doctor.  Go to the restroom when you feel like you need to poop. Do not hold it in.  Take over-the-counter and prescription medicines only as told by your doctor. These include any fiber supplements.  Do pelvic floor retraining exercises, such as: ? Doing deep breathing while relaxing your lower belly (abdomen). ? Relaxing your pelvic floor while pooping.  Watch your condition for any changes.  Keep all follow-up visits as told by your doctor. This is important. Contact a doctor if:  You have pain that gets worse.  You have a fever.  You have not pooped for 4 days.  You throw up (vomit).  You are not hungry.  You lose weight.  You are bleeding from the anus.  You have thin, pencil-like poop (stool). Get help right away if:  You have a fever, and your symptoms suddenly get worse.  You leak poop or have blood in your poop.  Your belly feels hard or bigger than normal (is bloated).  You have very bad belly pain.  You feel dizzy or you faint. This information is not intended to replace advice given to you by your health care provider. Make sure you discuss any questions you have with your health care provider. Document Released: 02/18/2008 Document Revised: 03/21/2016 Document Reviewed: 02/20/2016 Elsevier Interactive Patient  Education  2017 [employer].  increased hydration and fiber in diet. Daily probiotics. If bowels not moving can use MOM 2 tbls po in 4 oz of warm prune juice by mouth every 2-3 days. If no results then repeat in 4 hours with  Dulcolax suppository pr, may repeat again in 4 more hours as needed. Seek care if symptoms worsen. Consider daily Miralax and/or Dulcolax if symptoms persist.

## 2017-03-02 NOTE — Assessment & Plan Note (Signed)
>>  ASSESSMENT AND PLAN FOR OBESITY WRITTEN ON 03/02/2017  1:28 PM BY BLYTH, STACEY A, MD  Encouraged DASH diet, decrease po intake and increase exercise as tolerated. Needs 7-8 hours of sleep nightly. Avoid trans fats, eat small, frequent meals every 4-5 hours with lean proteins, complex carbs and healthy fats. Minimize simple carbs bariatric referral.

## 2017-03-02 NOTE — Assessment & Plan Note (Signed)
Encouraged heart healthy diet, increase exercise, avoid trans fats, consider a krill oil cap daily 

## 2017-03-02 NOTE — Assessment & Plan Note (Signed)
>>  ASSESSMENT AND PLAN FOR HYPOTHYROIDISM WRITTEN ON 03/02/2017  1:36 PM BY BLYTH, STACEY A, MD  On Levothyroxine, continue to monitor

## 2017-03-02 NOTE — Assessment & Plan Note (Signed)
Left knee required steroid shot recently, was performed by Dr Cay Schillings of ortho.

## 2017-03-02 NOTE — Assessment & Plan Note (Signed)
On Levothyroxine, continue to monitor 

## 2017-03-02 NOTE — Assessment & Plan Note (Signed)
Encouraged to get adequate exercise, calcium and vitamin d intake 

## 2017-03-02 NOTE — Assessment & Plan Note (Signed)
Encouraged DASH diet, decrease po intake and increase exercise as tolerated. Needs 7-8 hours of sleep nightly. Avoid trans fats, eat small, frequent meals every 4-5 hours with lean proteins, complex carbs and healthy fats. Minimize simple carbs bariatric referral 

## 2017-03-02 NOTE — Assessment & Plan Note (Signed)
Has lost several family members this year but does not feel it is overwhelming her. No change in meds

## 2017-03-02 NOTE — Assessment & Plan Note (Signed)
Check cbc 

## 2017-03-02 NOTE — Progress Notes (Signed)
Subjective:  I acted as a Education administrator for [name] Nolan. [name] Nolan, [place]  Patient ID: [name] Nolan, female    DOB: June 13, 1946, 71 y.o.   MRN: [id number]  No chief complaint on file.   HPI  Patient is in today for a follow up. Patient has no acute concerns.No recent febrile illness or acute hospitalizations. Denies CP/palp/SOB/HA/congestion/fevers/GI or GU c/o. Taking meds as prescribed. She has had a flare of left knee pain but has some relief with a steroid injection from ortho. She is sad due to the recent death of her 4 year old brother's death from cancer. Still struggles with constipation but moves her bowels comfortably every 2-3 days. No bloody or tarry stool. Denies CP/palp/SOB/HA/congestion/fevers or GU c/o. Taking meds as prescribed.   Patient Care Team: [name] Lukes, MD as PCP - General (Family Medicine) [name] Beam, MD as Consulting Physician (Neurology) [name] Rend, MD as Consulting Physician (Endocrinology) [name] Monarch, MD as Consulting Physician (Dermatology)   Past Medical History:  Diagnosis Date  . Acute bronchitis 08/15/2015  . Anxiety   . Anxiety state 05/07/2014   Widowed in 2013 after caring for her husband with Lewy Body Dementia for 6 years   . Arrhythmia 01/25/2015   Per Dr. Jaynee Nolan, [facility]; hx PVC  . Arthritis    "knees" (11/07/2015)  . 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 Basal cell carcinoma of right ear 02/26/2015   Removed by Dr [name] Nolan  . Chronic back pain    "mid-back; stops at the very lowest part of my back" (11/07/2015)  . Constipation 02/25/2016  . Depression   . Dysphagia   . Esophageal reflux    occ  . Fainting    fainted twice  . Family history of adverse reaction to anesthesia    "daughter gets bad PONV"  . Heart murmur   . Hot flashes 05/27/2016  . Hyperlipidemia   . Hypothyroid   . Insomnia   . Low ferritin 08/27/2015   "took supplements for awhile" (11/07/2015)  . Medicare annual wellness visit,  subsequent 08/27/2015  . Menopause   . Migraine    "under control w/daily RX right now" (11/07/2015)  . Occipital neuralgia   . Osteopenia 02/26/2015  . Osteoporosis    osteopenia  . Preventative health care 08/27/2015  . Prolonged depressive reaction   . Vitamin B12 deficiency 09/01/2016  . Vitamin D deficiency 03/02/2017    Past Surgical History:  Procedure Laterality Date  . APPENDECTOMY  11/07/2015  . BASAL CELL CARCINOMA EXCISION Right 02/26/2015   ear  . CHOLECYSTECTOMY OPEN  1974  . COLONOSCOPY  2006  . LAPAROSCOPIC APPENDECTOMY N/A 11/07/2015   Procedure: APPENDECTOMY LAPAROSCOPIC;  Surgeon: [name] Skeans, MD;  Location: [facility];  Service: General;  Laterality: N/A;  . LAPAROSCOPIC INCISIONAL / UMBILICAL / [place]  11/07/2015   UHR  . Tooth implant     at least 5 years ago per pt  . TUBAL LIGATION  1973  . UMBILICAL HERNIA REPAIR N/A 11/07/2015   Procedure: LAPAROSCOPIC UMBILICAL HERNIA;  Surgeon: [name] Skeans, MD;  Location[facility];  Service: General;  Laterality: N/A;    Family History  Problem Relation Age of Onset  . Congestive Heart Failure Mother   . Leukemia Father   . Kidney disease Brother   . Cancer Brother        stage 4 kidney cancer, metastatic  . Kidney cancer Brother   . Leukemia Maternal Aunt   . Congestive  Heart Failure Maternal Grandmother   . Arthritis Sister   . Colon cancer Neg Hx     Social History   Social History  . Marital status: Married    Spouse name: [name] Nolan  . Number of children: 1  . Years of education: HS   Occupational History  . Retired    Social History Main Topics  . Smoking status: Never Smoker  . Smokeless tobacco: Never Used  . Alcohol use No  . Drug use: No  . Sexual activity: Yes    Birth control/ protection: Post-menopausal     Comment: lives with husband, no dietary restrictions, avoids caffeine, bananas, dairy   Other Topics Concern  . Not on file   Social History Narrative   Patient is  married [name] Nolan) and lives at home with her husband.   Patient has one child.   Patient has a high school education.   Patient is right-handed.   Caffeine Use: Occasionally    Outpatient Medications Prior to Visit  Medication Sig Dispense Refill  . B Complex Vitamins (B-COMPLEX/B-12 PO) Take 2,500 mg by mouth daily.     Marland Kitchen BOTOX 100 units SOLR injection INJECT 155 UNITS INTO FACIAL AND NECK MUSCLES EVERY 3 MONTHS (GIVEN AT MD OFFICE, DISCARD UNUSED PORTION AFTER FIRST USE) 2 vial 3  . cyclobenzaprine (FLEXERIL) 10 MG tablet TAKE 1 TABLET BY MOUTH AT BEDTIME. CAN ALSO TAKE 1/2 -1 TABLET TWICE DAILY FOR HEADACHE 90 tablet 11  . rizatriptan (MAXALT-MLT) 10 MG disintegrating tablet Take 1 tablet (10 mg total) by mouth as needed for migraine. May repeat in 2 hours if needed 15 tablet 11  . venlafaxine XR (EFFEXOR XR) 75 MG 24 hr capsule Take 1 capsule (75 mg total) by mouth daily with breakfast. 90 capsule 1  . levothyroxine (SYNTHROID, LEVOTHROID) 75 MCG tablet Take 75 mcg by mouth daily before breakfast. Reported on 10/25/2015    . ALPRAZolam (XANAX) 1 MG tablet Take 1/2 to 1 tablet at bedtime as needed for sleep 30 tablet 0   No facility-administered medications prior to visit.     Allergies  Allergen Reactions  . Statins Other (See Comments)    Muscle pain  . Codeine Nausea And Vomiting    Review of Systems  Constitutional: Positive for malaise/fatigue. Negative for fever.  HENT: Negative for congestion.   Eyes: Negative for blurred vision.  Respiratory: Negative for cough and shortness of breath.   Cardiovascular: Negative for chest pain, palpitations and leg swelling.  Gastrointestinal: Positive for constipation. Negative for vomiting.  Musculoskeletal: Positive for joint pain. Negative for back pain.  Skin: Negative for rash.  Neurological: Negative for loss of consciousness and headaches.  Psychiatric/Behavioral: Positive for depression.       Objective:    Physical Exam    Constitutional: She is oriented to person, place, and time. She appears well-developed and well-nourished. No distress.  HENT:  Head: Normocephalic and atraumatic.  Nose: Nose normal.  Eyes: Conjunctivae are normal. Right eye exhibits no discharge. Left eye exhibits no discharge.  Neck: Normal range of motion. Neck supple. No thyromegaly present.  Cardiovascular: Normal rate and regular rhythm.   No murmur heard. Pulmonary/Chest: Effort normal and breath sounds normal. She has no wheezes.  Abdominal: Soft. Bowel sounds are normal. There is no tenderness.  Musculoskeletal: Normal range of motion. She exhibits no edema or deformity.  Neurological: She is alert and oriented to person, place, and time.  Skin: Skin is warm and dry. She is  not diaphoretic.  Psychiatric: She has a normal mood and affect.  Nursing note and vitals reviewed.   BP 122/86 (BP Location: Left Arm, Patient Position: Sitting, Cuff Size: Normal)   Pulse 74   Temp 98.6 F (37 C) (Oral)   Resp 18   Wt 184 lb 12.8 oz (83.8 kg)   SpO2 98%   BMI 30.75 kg/m  Wt Readings from Last 3 Encounters:  03/02/17 184 lb 12.8 oz (83.8 kg)  11/27/16 189 lb 3.2 oz (85.8 kg)  09/01/16 185 lb (83.9 kg)   BP Readings from Last 3 Encounters:  03/02/17 122/86  11/27/16 130/83  09/01/16 136/86     Immunization History  Administered Date(s) Administered  . Influenza,inj,Quad PF,36+ Mos 05/29/2015  . Influenza-Unspecified 05/16/2014, 04/24/2016  . Pneumococcal Conjugate-13 09/04/2014  . Pneumococcal Polysaccharide-23 08/27/2015  . Tdap 08/24/2014  . Zoster 08/28/2014    Health Maintenance  Topic Date Due  . MAMMOGRAM  09/18/2016  . INFLUENZA VACCINE  05/27/2017 (Originally 04/15/2017)  . COLON CANCER SCREENING ANNUAL FOBT  09/17/2017  . COLONOSCOPY  10/28/2018  . TETANUS/TDAP  08/24/2024  . DEXA SCAN  Completed  . Hepatitis C Screening  Completed  . PNA vac Low Risk Adult  Completed    Lab Results  Component Value  Date   WBC 4.0 09/23/2016   HGB 11.9 (L) 09/23/2016   HCT 36.3 09/23/2016   PLT 286.0 09/23/2016   GLUCOSE 97 09/01/2016   CHOL 194 09/01/2016   TRIG 104.0 09/01/2016   HDL 57.10 09/01/2016   LDLCALC 116 (H) 09/01/2016   ALT 19 09/01/2016   AST 31 09/01/2016   NA 141 09/01/2016   K 4.0 09/01/2016   CL 107 09/01/2016   CREATININE 0.90 09/01/2016   BUN 11 09/01/2016   CO2 28 09/01/2016   TSH 1.29 09/01/2016    Lab Results  Component Value Date   TSH 1.29 09/01/2016   Lab Results  Component Value Date   WBC 4.0 09/23/2016   HGB 11.9 (L) 09/23/2016   HCT 36.3 09/23/2016   MCV 79.2 09/23/2016   PLT 286.0 09/23/2016   Lab Results  Component Value Date   NA 141 09/01/2016   K 4.0 09/01/2016   CO2 28 09/01/2016   GLUCOSE 97 09/01/2016   BUN 11 09/01/2016   CREATININE 0.90 09/01/2016   BILITOT 0.4 09/01/2016   ALKPHOS 79 09/01/2016   AST 31 09/01/2016   ALT 19 09/01/2016   PROT 7.0 09/01/2016   ALBUMIN 4.1 09/01/2016   CALCIUM 9.5 09/01/2016   ANIONGAP 8 11/06/2015   GFR 65.99 09/01/2016   Lab Results  Component Value Date   CHOL 194 09/01/2016   Lab Results  Component Value Date   HDL 57.10 09/01/2016   Lab Results  Component Value Date   LDLCALC 116 (H) 09/01/2016   Lab Results  Component Value Date   TRIG 104.0 09/01/2016   Lab Results  Component Value Date   CHOLHDL 3 09/01/2016   No results found for: HGBA1C       Assessment & Plan:   Problem List Items Addressed This Visit    Hyperlipidemia    Encouraged heart healthy diet, increase exercise, avoid trans fats, consider a krill oil cap daily      Relevant Orders   Lipid panel   Hypothyroidism    On Levothyroxine, continue to monitor      Relevant Medications   levothyroxine (SYNTHROID) 75 MCG tablet   Other Relevant Orders  TSH   Obesity    Encouraged DASH diet, decrease po intake and increase exercise as tolerated. Needs 7-8 hours of sleep nightly. Avoid trans fats, eat  small, frequent meals every 4-5 hours with lean proteins, complex carbs and healthy fats. Minimize simple carbs bariatric referral.      Osteopenia    Encouraged to get adequate exercise, calcium and vitamin d intake      Relevant Orders   Comprehensive metabolic panel   Low ferritin    Check cbc      Relevant Orders   CBC   Constipation    Encouraged increased hydration and fiber in diet. Daily probiotics. If bowels not moving can use MOM 2 tbls po in 4 oz of warm prune juice by mouth every 2-3 days. If no results then repeat in 4 hours with  Dulcolax suppository pr, may repeat again in 4 more hours as needed. Seek care if symptoms worsen. Consider daily Miralax and/or Dulcolax if symptoms persist.       Relevant Orders   Comprehensive metabolic panel   Depression with anxiety    Has lost several family members this year but does not feel it is overwhelming her. No change in meds      Vitamin B12 deficiency    Taking SL Vitamin B 12 check level      Relevant Orders   CBC   Vitamin B12   Arthritis of knee, degenerative    Left knee required steroid shot recently, was performed by Dr Cay Schillings of ortho.      Vitamin D deficiency    Check vitamin D level today      Relevant Orders   Vitamin D (25 hydroxy)      I have discontinued Ms. Stovall-Edwards's ALPRAZolam. I am also having her maintain her B Complex Vitamins (B-COMPLEX/B-12 PO), rizatriptan, venlafaxine XR, cyclobenzaprine, BOTOX, and levothyroxine.  Meds ordered this encounter  Medications  . levothyroxine (SYNTHROID) 75 MCG tablet    Sig: Take 1 tablet by mouth daily.    CMA served as Education administrator during this visit. History, Physical and Plan performed by medical provider. Documentation and orders reviewed and attested to.  Penni Homans, MD

## 2017-03-02 NOTE — Assessment & Plan Note (Signed)
Encouraged increased hydration and fiber in diet. Daily probiotics. If bowels not moving can use MOM 2 tbls po in 4 oz of warm prune juice by mouth every 2-3 days. If no results then repeat in 4 hours with  Dulcolax suppository pr, may repeat again in 4 more hours as needed. Seek care if symptoms worsen. Consider daily Miralax and/or Dulcolax if symptoms persist.  

## 2017-03-12 ENCOUNTER — Ambulatory Visit (INDEPENDENT_AMBULATORY_CARE_PROVIDER_SITE_OTHER): Payer: Medicare Other | Admitting: Neurology

## 2017-03-12 ENCOUNTER — Telehealth: Payer: Self-pay | Admitting: Neurology

## 2017-03-12 VITALS — BP 126/87 | HR 76 | Ht 65.0 in | Wt 187.6 lb

## 2017-03-12 DIAGNOSIS — G43719 Chronic migraine without aura, intractable, without status migrainosus: Secondary | ICD-10-CM | POA: Diagnosis not present

## 2017-03-12 NOTE — Telephone Encounter (Signed)
Please call pt to schedule BOTOX appt. Thank you. °

## 2017-03-12 NOTE — Progress Notes (Signed)
Botox 100 units/vial x 2 vials from [place] [phone] Lot [id number] Exp 12 2020  Diluted in 4 ml of Bacteriostatic 0.9% NaCl NDC 7185-5015-86 Lot [id number] Exp [id number]

## 2017-03-16 NOTE — Telephone Encounter (Signed)
I called and left a VM asking the patient to call me back.  °

## 2017-03-19 NOTE — Progress Notes (Signed)

## 2017-05-10 ENCOUNTER — Encounter: Payer: Self-pay | Admitting: Family Medicine

## 2017-05-11 MED ORDER — VENLAFAXINE HCL ER 75 MG PO CP24: 75.0000 mg | ORAL_CAPSULE | Freq: Every day | ORAL | 1 refills | Status: DC

## 2017-06-02 ENCOUNTER — Telehealth: Payer: Self-pay | Admitting: Neurology

## 2017-06-02 NOTE — Telephone Encounter (Signed)
Ray with [id number] called office to schedule time for delivery of patients Botox.  Please call

## 2017-06-08 NOTE — Telephone Encounter (Signed)
Patient has been scheduled

## 2017-06-09 ENCOUNTER — Encounter: Payer: Self-pay | Admitting: Neurology

## 2017-06-09 ENCOUNTER — Ambulatory Visit (INDEPENDENT_AMBULATORY_CARE_PROVIDER_SITE_OTHER): Payer: Medicare Other | Admitting: Neurology

## 2017-06-09 VITALS — BP 130/85 | HR 87 | Ht 65.0 in | Wt 187.0 lb

## 2017-06-09 DIAGNOSIS — G43719 Chronic migraine without aura, intractable, without status migrainosus: Secondary | ICD-10-CM

## 2017-06-09 NOTE — Progress Notes (Signed)

## 2017-06-09 NOTE — Progress Notes (Signed)
Botox-100unitsx2 vials Lot: [id number] Expiration: 12/2019 NDC: 7846-9629-52 [id number]  0.9% Sodium Chloride- 40mL total Lot: [id number] Expiration: 12/2018 NDC: 44034-742-59  Dx: D63.875 SP

## 2017-06-10 ENCOUNTER — Ambulatory Visit: Payer: Medicare Other | Admitting: Neurology

## 2017-06-19 ENCOUNTER — Telehealth: Payer: Self-pay | Admitting: *Deleted

## 2017-06-19 NOTE — Telephone Encounter (Signed)
Spoke with patient who stated she received Aimovig in the mail one week ago. Advised her that going forward the Rx will be sent to [facility] [place]. Patient stated that the Botox she is receiving is "working great". She does not want to begin Aimovig, and stated she has no intention of taking it at this time. This RN questioned if she would like to hold of on sending [facility] to [facility], and she stated she would. This RN advised she call for any questions, problems or if she decides she wants Aimovig Rx sent to [facility]. She verbalized understanding of call.

## 2017-06-24 ENCOUNTER — Telehealth: Payer: Self-pay | Admitting: Neurology

## 2017-06-24 NOTE — Telephone Encounter (Signed)
Pt called to schedule botox appt. Last botox was 9/25, next appt is scheduled for 09/16/17. Thank you

## 2017-06-29 NOTE — Telephone Encounter (Signed)
Noted, thank you

## 2017-07-12 ENCOUNTER — Encounter: Payer: Self-pay | Admitting: Neurology

## 2017-08-21 ENCOUNTER — Telehealth: Payer: Self-pay | Admitting: Family Medicine

## 2017-08-21 NOTE — Telephone Encounter (Signed)
Pt did not pick up their Rx for XANAX dated 02/12/2017 RX was shredded

## 2017-09-02 NOTE — Progress Notes (Signed)
Subjective:   [name] Nolan is a 71 y.o. female who presents for Medicare Annual (Subsequent) preventive examination.  Review of Systems:  No ROS.  Medicare Wellness Visit. Additional risk factors are reflected in the social history. Cardiac Risk Factors include: advanced age (>105mn, >>101women);dyslipidemia Sleep patterns: Takes Flexeril. Sleeps 6-8 hrs. Home Safety/Smoke Alarms: Feels safe in home. Smoke alarms in place.  Living environment; residence and Firearm Safety: 1-story house/ trailer, number of outside stairs: 8, can live on one level, no firearms, firearms stored safely. Lives with husband who has parkinson's. Seat Belt Safety/Bike Helmet: Wears seat belt.  Female:   Pap- No longer doing routine screening due to age.      Mammo- lst 09/18/14: normal. ordered     Dexa scan-  Last 09/18/14: low bone mass . ordered    CCS- Last 10/29/15: 3 yr recall. Dr.Armbruster.      Objective:     Vitals: BP 140/86 (BP Location: Left Arm, Patient Position: Sitting, Cuff Size: Normal)   Pulse 95   Temp 98.7 F (37.1 C) (Oral)   Resp 18   Wt 211 lb 3.2 oz (95.8 kg)   SpO2 98%   BMI 35.15 kg/m   Body mass index is 35.15 kg/m.  Advanced Directives 09/03/2017 09/01/2016 11/07/2015 11/07/2015 11/06/2015 10/26/2015 10/04/2015  Does Patient Have a Medical Advance Directive? Yes Yes - Yes Yes Yes Yes  Type of Advance Directive [place]Living will [place]um[place] will - [place] will Living will;Healthcare Power of Attorney Living will;Healthcare Power of AAlexanderLiving will  Does patient want to make changes to medical advance directive? No - Patient declined - No - Patient declined - No - Patient declined - -  Copy of H[place] Chart? No - copy requested No - copy requested - Yes No - copy requested - -    Tobacco Social History   Tobacco Use  Smoking Status  Never Smoker  Smokeless Tobacco Never Used     Counseling given: Not Answered   Clinical Intake: Pain : No/denies pain   Past Medical History:  Diagnosis Date  . Acute bronchitis 08/15/2015  . Anxiety   . Anxiety state 05/07/2014   Widowed in 2013 after caring for her husband with Lewy Body Dementia for 6 years   . Arrhythmia 01/25/2015   Per Dr. AJaynee Eagles [facility]; hx PVC  . Arthritis    "knees" (11/07/2015)  . 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 Basal cell carcinoma of right ear 02/26/2015   [place] Dr [place] . Chronic back pain    "mid-back; stops at the very lowest part of my back" (11/07/2015)  . Colon polyp 09/03/2017  . Constipation 02/25/2016  . Depression   . Dysphagia   . Esophageal reflux    occ  . Fainting    fainted twice  . Family history of adverse reaction to anesthesia    "daughter gets bad PONV"  . Heart murmur   . Hot flashes 05/27/2016  . Hyperlipidemia   . Hypothyroid   . Insomnia   . Low ferritin 08/27/2015   "took supplements for awhile" (11/07/2015)  . Medicare annual wellness visit, subsequent 08/27/2015  . Menopause   . Migraine    "under control w/daily RX right now" (11/07/2015)  . Occipital neuralgia   . Osteopenia 02/26/2015  . Osteoporosis    osteopenia  . Preventative health care 08/27/2015  .  Prolonged depressive reaction   . Vitamin B12 deficiency 09/01/2016  . Vitamin D deficiency 03/02/2017   Past Surgical History:  Procedure Laterality Date  . APPENDECTOMY  11/07/2015  . BASAL CELL CARCINOMA EXCISION Right 02/26/2015   ear  . CHOLECYSTECTOMY OPEN  1974  . COLONOSCOPY  2006  . LAPAROSCOPIC APPENDECTOMY N/A 11/07/2015   Procedure: APPENDECTOMY LAPAROSCOPIC;  Surgeon: Georganna Skeans, MD;  Location: [facility];  Service: General;  Laterality: N/A;  . LAPAROSCOPIC INCISIONAL / UMBILICAL / [place]  11/07/2015   UHR  . Tooth implant     at least 5 years ago per pt  . TUBAL LIGATION  1973  . UMBILICAL  HERNIA REPAIR N/A 11/07/2015   Procedure: LAPAROSCOPIC UMBILICAL HERNIA;  Surgeon: Georganna Skeans, MD;  Location: [facility] OR;  Service: General;  Laterality: N/A;   Family History  Problem Relation Age of Onset  . Congestive Heart Failure Mother   . Leukemia Father   . Kidney disease Brother   . Cancer Brother        stage 4 kidney cancer, metastatic  . Kidney cancer Brother   . Leukemia Maternal Aunt   . Congestive Heart Failure Maternal Grandmother   . Arthritis Sister   . Colon cancer Neg Hx    Social History   Socioeconomic History  . Marital status: Married    Spouse name: [name]  . Number of children: 1  . Years of education: HS  . Highest education level: None  Social Needs  . Financial resource strain: None  . Food insecurity - worry: None  . Food insecurity - inability: None  . Transportation needs - medical: None  . Transportation needs - non-medical: None  Occupational History  . Occupation: Retired  Tobacco Use  . Smoking status: Never Smoker  . Smokeless tobacco: Never Used  Substance and Sexual Activity  . Alcohol use: No    Alcohol/week: 0.0 oz  . Drug use: No  . Sexual activity: Yes    Birth control/protection: Post-menopausal    Comment: lives with husband, no dietary restrictions, avoids caffeine, bananas, dairy  Other Topics Concern  . None  Social History Narrative   Patient is married [name]) and lives at home with her husband.   Patient has one child.   Patient has a high school education.   Patient is right-handed.   Caffeine Use: Occasionally    Outpatient Encounter Medications as of 09/03/2017  Medication Sig  . B Complex Vitamins (B-COMPLEX/B-12 PO) Take 2,500 mg by mouth daily.   Marland Kitchen BOTOX 100 units SOLR injection INJECT 155 UNITS INTO FACIAL AND NECK MUSCLES EVERY 3 MONTHS (GIVEN AT MD OFFICE, DISCARD UNUSED PORTION AFTER FIRST USE)  . cyclobenzaprine (FLEXERIL) 10 MG tablet TAKE 1 TABLET BY MOUTH AT BEDTIME. CAN ALSO TAKE 1/2 -1 TABLET TWICE  DAILY FOR HEADACHE  . levothyroxine (SYNTHROID) 75 MCG tablet Take 1 tablet by mouth daily.  . rizatriptan (MAXALT-MLT) 10 MG disintegrating tablet Take 1 tablet (10 mg total) by mouth as needed for migraine. May repeat in 2 hours if needed  . [DISCONTINUED] venlafaxine XR (EFFEXOR XR) 75 MG 24 hr capsule Take 1 capsule (75 mg total) by mouth daily with breakfast.  . Venlafaxine HCl 150 MG TB24 Take 1 tablet (150 mg total) by mouth daily.   No facility-administered encounter medications on file as of 09/03/2017.     Activities of Daily Living In your present state of health, do you have any difficulty performing the following  activities: 09/03/2017  Hearing? N  Vision? N  Comment Wears glasses. [facility] yearly  Difficulty concentrating or making decisions? N  Walking or climbing stairs? N  Dressing or bathing? N  Doing errands, shopping? N  Preparing Food and eating ? N  Using the Toilet? N  In the past six months, have you accidently leaked urine? N  Do you have problems with loss of bowel control? N  Managing your Medications? N  Managing your Finances? N  Housekeeping or managing your Housekeeping? N  Some recent data might be hidden    Patient Care Team: Mosie Lukes, MD as PCP - General (Family Medicine) Melvenia Beam, MD as Consulting Physician (Neurology) Delrae Rend, MD as Consulting Physician (Endocrinology) Lavonna Monarch, MD as Consulting Physician (Dermatology)    Assessment:   This is a routine wellness examination for [name] Nolan. Physical assessment deferred to PCP.  Exercise Activities and Dietary recommendations Current Exercise Habits: Home exercise routine, Type of exercise: walking, Time (Minutes): 60, Frequency (Times/Week): 6, Weekly Exercise (Minutes/Week): 360, Intensity: Mild Diet (meal preparation, eat out, water intake, caffeinated beverages, dairy products, fruits and vegetables): well balanced   Goals    . Lose 10 lbs by next year.    (pt-stated)       Fall Risk Fall Risk  09/03/2017 09/01/2016 08/27/2015 05/29/2015 05/16/2014  Falls in the past year? No No - No No  Risk for fall due to : - - History of fall(s) History of fall(s) -   Depression Screen PHQ 2/9 Scores 09/03/2017 09/01/2016 08/27/2015 05/29/2015  PHQ - 2 Score 0 2 1 0  PHQ- 9 Score - 6 - -  Exception Documentation - - - Patient refusal     Cognitive Function MMSE - Mini Mental State Exam 09/01/2016  Orientation to time 5  Orientation to Place 5  Registration 3  Attention/ Calculation 5  Recall 3  Language- name 2 objects 2  Language- repeat 1  Language- follow 3 step command 3  Language- read & follow direction 1  Write a sentence 1  Copy design 1  Total score 30        Immunization History  Administered Date(s) Administered  . Influenza, High Dose Seasonal PF 06/24/2017  . Influenza,inj,Quad PF,6+ Mos 05/29/2015  . Influenza-Unspecified 05/16/2014, 04/24/2016  . Pneumococcal Conjugate-13 09/04/2014  . Pneumococcal Polysaccharide-23 08/27/2015  . Tdap 08/24/2014  . Zoster 08/28/2014    Screening Tests Health Maintenance  Topic Date Due  . MAMMOGRAM  09/18/2016  . COLON CANCER SCREENING ANNUAL FOBT  09/17/2017  . COLONOSCOPY  10/28/2018  . TETANUS/TDAP  08/24/2024  . INFLUENZA VACCINE  Completed  . DEXA SCAN  Completed  . Hepatitis C Screening  Completed  . PNA vac Low Risk Adult  Completed      Plan:   Follow up with PCP as directed  Continue to eat heart healthy diet (full of fruits, vegetables, whole grains, lean protein, water--limit salt, fat, and sugar intake) and increase physical activity as tolerated.  Continue doing brain stimulating activities (puzzles, reading, adult coloring books, staying active) to keep memory sharp.   I have personally reviewed and noted the following in the patient's chart:   . Medical and social history . Use of alcohol, tobacco or illicit drugs  . Current medications and  supplements . Functional ability and status . Nutritional status . Physical activity . Advanced directives . List of other physicians . Hospitalizations, surgeries, and ER visits in previous 39  months . Vitals . Screenings to include cognitive, depression, and falls . Referrals and appointments  In addition, I have reviewed and discussed with patient certain preventive protocols, quality metrics, and best practice recommendations. A written personalized care plan for preventive services as well as general preventive health recommendations were provided to patient.     Shela Nevin, [place]  09/03/2017    Medical screening examination was performed by Health Coach and as supervising physician I was immediately available for consultation/collaboration. I have reviewed documentation and agree with assessment and plan.  Penni Homans, MD

## 2017-09-03 ENCOUNTER — Ambulatory Visit (INDEPENDENT_AMBULATORY_CARE_PROVIDER_SITE_OTHER): Payer: Medicare Other | Admitting: Family Medicine

## 2017-09-03 ENCOUNTER — Encounter: Payer: Self-pay | Admitting: Family Medicine

## 2017-09-03 VITALS — BP 140/86 | HR 95 | Temp 98.7°F | Resp 18 | Wt 211.2 lb

## 2017-09-03 DIAGNOSIS — E782 Mixed hyperlipidemia: Secondary | ICD-10-CM

## 2017-09-03 DIAGNOSIS — D508 Other iron deficiency anemias: Secondary | ICD-10-CM | POA: Diagnosis not present

## 2017-09-03 DIAGNOSIS — R790 Abnormal level of blood mineral: Secondary | ICD-10-CM

## 2017-09-03 DIAGNOSIS — Z860100 Personal history of colon polyps, unspecified: Secondary | ICD-10-CM | POA: Insufficient documentation

## 2017-09-03 DIAGNOSIS — Z0000 Encounter for general adult medical examination without abnormal findings: Secondary | ICD-10-CM

## 2017-09-03 DIAGNOSIS — M8580 Other specified disorders of bone density and structure, unspecified site: Secondary | ICD-10-CM | POA: Diagnosis not present

## 2017-09-03 DIAGNOSIS — Z780 Asymptomatic menopausal state: Secondary | ICD-10-CM | POA: Diagnosis not present

## 2017-09-03 DIAGNOSIS — F418 Other specified anxiety disorders: Secondary | ICD-10-CM | POA: Diagnosis not present

## 2017-09-03 DIAGNOSIS — D649 Anemia, unspecified: Secondary | ICD-10-CM

## 2017-09-03 DIAGNOSIS — Z0001 Encounter for general adult medical examination with abnormal findings: Secondary | ICD-10-CM

## 2017-09-03 DIAGNOSIS — E538 Deficiency of other specified B group vitamins: Secondary | ICD-10-CM | POA: Diagnosis not present

## 2017-09-03 DIAGNOSIS — E559 Vitamin D deficiency, unspecified: Secondary | ICD-10-CM

## 2017-09-03 DIAGNOSIS — E039 Hypothyroidism, unspecified: Secondary | ICD-10-CM

## 2017-09-03 DIAGNOSIS — K635 Polyp of colon: Secondary | ICD-10-CM

## 2017-09-03 DIAGNOSIS — Z1231 Encounter for screening mammogram for malignant neoplasm of breast: Secondary | ICD-10-CM

## 2017-09-03 DIAGNOSIS — Z1239 Encounter for other screening for malignant neoplasm of breast: Secondary | ICD-10-CM

## 2017-09-03 HISTORY — DX: Anemia, unspecified: D64.9

## 2017-09-03 HISTORY — DX: Personal history of colon polyps, unspecified: Z86.0100

## 2017-09-03 HISTORY — DX: Polyp of colon: K63.5

## 2017-09-03 LAB — RETICULOCYTES
ABS RETIC: 87400 {cells}/uL — AB (ref 20000–8000)
RETIC CT PCT: 2 %

## 2017-09-03 MED ORDER — VENLAFAXINE HCL ER 150 MG PO TB24: 150.0000 mg | ORAL_TABLET | Freq: Every day | ORAL | 5 refills | Status: DC

## 2017-09-03 NOTE — Assessment & Plan Note (Signed)
Patient encouraged to maintain heart healthy diet, regular exercise, adequate sleep. Consider daily probiotics. Take medications as prescribed. Declines pap has no history of abnormal paps

## 2017-09-03 NOTE — Assessment & Plan Note (Signed)
Check level 

## 2017-09-03 NOTE — Assessment & Plan Note (Signed)
Encouraged heart healthy diet, increase exercise, avoid trans fats, consider a krill oil cap daily 

## 2017-09-03 NOTE — Assessment & Plan Note (Signed)
On Levothyroxine, continue to monitor 

## 2017-09-03 NOTE — Assessment & Plan Note (Signed)
check

## 2017-09-03 NOTE — Assessment & Plan Note (Signed)
Increase leafy greens, consider increased lean red meat and using cast iron cookware. Continue to monitor, report any concerns 

## 2017-09-03 NOTE — Assessment & Plan Note (Signed)
Encouraged to get adequate exercise, calcium and vitamin d intake 

## 2017-09-03 NOTE — Patient Instructions (Addendum)
Dr Tat for Parkinson's Disease  Noom  DASH/MIND diet  Shingrix is the new shingles snot, 2 shots over 2-6 months  Encouraged to get adequate exercise, calcium and vitamin d intake. Bone density shows osteopenia, which is thinner than normal but not as bad as osteoporosis. Recommend calcium intake of 1200 to 1500 mg daily, divided into roughly 3 doses. Best source is the diet and a single dairy serving is about 500 mg, a supplement of calcium citrate once or twice daily to balance diet is fine if not getting enough in diet. Also need Vitamin D 2000 IU caps, 1 cap daily if not already taking vitamin D. Also recommend weight baring exercise on hips and upper body to keep bones strong Preventive Care 65 Years and Older, Female Preventive care refers to lifestyle choices and visits with your health care provider that can promote health and wellness. What does preventive care include?  A yearly physical exam. This is also called an annual well check.  Dental exams once or twice a year.  Routine eye exams. Ask your health care provider how often you should have your eyes checked.  Personal lifestyle choices, including: ? Daily care of your teeth and gums. ? Regular physical activity. ? Eating a healthy diet. ? Avoiding tobacco and drug use. ? Limiting alcohol use. ? Practicing safe sex. ? Taking low-dose aspirin every day. ? Taking vitamin and mineral supplements as recommended by your health care provider. What happens during an annual well check? The services and screenings done by your health care provider during your annual well check will depend on your age, overall health, lifestyle risk factors, and family history of disease. Counseling Your health care provider may ask you questions about your:  Alcohol use.  Tobacco use.  Drug use.  Emotional well-being.  Home and relationship well-being.  Sexual activity.  Eating habits.  History of falls.  Memory and ability to  understand (cognition).  Work and work Statistician.  Reproductive health.  Screening You may have the following tests or measurements:  Height, weight, and BMI.  Blood pressure.  Lipid and cholesterol levels. These may be checked every 5 years, or more frequently if you are over 70 years old.  Skin check.  Lung cancer screening. You may have this screening every year starting at age 53 if you have a 30-pack-year history of smoking and currently smoke or have quit within the past 15 years.  Fecal occult blood test (FOBT) of the stool. You may have this test every year starting at age 61.  Flexible sigmoidoscopy or colonoscopy. You may have a sigmoidoscopy every 5 years or a colonoscopy every 10 years starting at age 34.  Hepatitis C blood test.  Hepatitis B blood test.  Sexually transmitted disease (STD) testing.  Diabetes screening. This is done by checking your blood sugar (glucose) after you have not eaten for a while (fasting). You may have this done every 1-3 years.  Bone density scan. This is done to screen for osteoporosis. You may have this done starting at age 81.  Mammogram. This may be done every 1-2 years. Talk to your health care provider about how often you should have regular mammograms.  Talk with your health care provider about your test results, treatment options, and if necessary, the need for more tests. Vaccines Your health care provider may recommend certain vaccines, such as:  Influenza vaccine. This is recommended every year.  Tetanus, diphtheria, and acellular pertussis (Tdap, Td) vaccine. You may  need a Td booster every 10 years.  Varicella vaccine. You may need this if you have not been vaccinated.  Zoster vaccine. You may need this after age 58.  Measles, mumps, and rubella (MMR) vaccine. You may need at least one dose of MMR if you were born in 1957 or later. You may also need a second dose.  Pneumococcal 13-valent conjugate (PCV13)  vaccine. One dose is recommended after age 23.  Pneumococcal polysaccharide (PPSV23) vaccine. One dose is recommended after age 94.  Meningococcal vaccine. You may need this if you have certain conditions.  Hepatitis A vaccine. You may need this if you have certain conditions or if you travel or work in places where you may be exposed to hepatitis A.  Hepatitis B vaccine. You may need this if you have certain conditions or if you travel or work in places where you may be exposed to hepatitis B.  Haemophilus influenzae type b (Hib) vaccine. You may need this if you have certain conditions.  Talk to your health care provider about which screenings and vaccines you need and how often you need them. This information is not intended to replace advice given to you by your health care provider. Make sure you discuss any questions you have with your health care provider. Document Released: 09/28/2015 Document Revised: 05/21/2016 Document Reviewed: 07/03/2015 Elsevier Interactive Patient Education  [employer].

## 2017-09-03 NOTE — Progress Notes (Signed)
Patient ID: Aradia Estey, female   DOB: June 09, 1946, 71 y.o.   MRN: [id number]   Subjective:    Patient ID: [name], female    DOB: Dec 23, 1945, 71 y.o.   MRN: [id number]  Chief Complaint  Patient presents with  . Medicare Wellness    HPI Patient is in today for annual exam and follow-up on chronic medical conditions including vitamin D deficiency, hypothyroidism, obesity and hyperlipidemia.  Physically she offers only minor complaints but stress has been admitted to fast for this year.  She continues to care for her husband who has significantly worsening Parkinson's disease and in 2023/02/06 her youngest brother died.  She is frustrated with her weight gain but acknowledges it is it is because she is eating poorly and sleeping poorly.  No polyuria or polydipsia.  Is doing well with activities of daily living and tries to stay as active as possible. Denies CP/palp/SOB/HA/congestion/fevers/GI or GU c/o. Taking meds as prescribed  Past Medical History:  Diagnosis Date  . Acute bronchitis 08/15/2015  . Anxiety   . Anxiety state 05/07/2014   Widowed in 2013 after caring for her husband with Lewy Body Dementia for 6 years   . Arrhythmia 01/25/2015   Per Dr. Jaynee Eagles, [facility]; hx PVC  . Arthritis    "knees" (11/07/2015)  . 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 Basal cell carcinoma of right ear 02/26/2015   Removed by Dr [place]  . Chronic back pain    "mid-back; stops at the very lowest part of my back" (11/07/2015)  . Colon polyp 09/03/2017  . Constipation 02/25/2016  . Depression   . Dysphagia   . Esophageal reflux    occ  . Fainting    fainted twice  . Family history of adverse reaction to anesthesia    "daughter gets bad PONV"  . Heart murmur   . Hot flashes 05/27/2016  . Hyperlipidemia   . Hypothyroid   . Insomnia   . Low ferritin 08/27/2015   "took supplements for awhile" (11/07/2015)  . Medicare annual wellness visit, subsequent 08/27/2015  .  Menopause   . Migraine    "under control w/daily RX right now" (11/07/2015)  . Occipital neuralgia   . Osteopenia 02/26/2015  . Osteoporosis    osteopenia  . Preventative health care 08/27/2015  . Prolonged depressive reaction   . Vitamin B12 deficiency 09/01/2016  . Vitamin D deficiency 03/02/2017    Past Surgical History:  Procedure Laterality Date  . APPENDECTOMY  11/07/2015  . BASAL CELL CARCINOMA EXCISION Right 02/26/2015   ear  . CHOLECYSTECTOMY OPEN  1974  . COLONOSCOPY  2006  . LAPAROSCOPIC APPENDECTOMY N/A 11/07/2015   Procedure: APPENDECTOMY LAPAROSCOPIC;  Surgeon: Georganna Skeans, MD;  Location: [facility];  Service: General;  Laterality: N/A;  . LAPAROSCOPIC INCISIONAL / UMBILICAL / [place]  11/07/2015   UHR  . Tooth implant     at least 5 years ago per pt  . TUBAL LIGATION  1973  . UMBILICAL HERNIA REPAIR N/A 11/07/2015   Procedure: LAPAROSCOPIC UMBILICAL HERNIA;  Surgeon: Georganna Skeans, MD;  Location[facility];  Service: General;  Laterality: N/A;    Family History  Problem Relation Age of Onset  . Congestive Heart Failure Mother   . Leukemia Father   . Kidney disease Brother   . Cancer Brother        stage 4 kidney cancer, metastatic  . Kidney cancer Brother   . Leukemia Maternal Aunt   .  Congestive Heart Failure Maternal Grandmother   . Arthritis Sister   . Colon cancer Neg Hx     Social History   Socioeconomic History  . Marital status: Married    Spouse name: [name]  . Number of children: 1  . Years of education: HS  . Highest education level: Not on file  Social Needs  . Financial resource strain: Not on file  . Food insecurity - worry: Not on file  . Food insecurity - inability: Not on file  . Transportation needs - medical: Not on file  . Transportation needs - non-medical: Not on file  Occupational History  . Occupation: Retired  Tobacco Use  . Smoking status: Never Smoker  . Smokeless tobacco: Never Used  Substance and Sexual Activity   . Alcohol use: No    Alcohol/week: 0.0 oz  . Drug use: No  . Sexual activity: Yes    Birth control/protection: Post-menopausal    Comment: lives with husband, no dietary restrictions, avoids caffeine, bananas, dairy  Other Topics Concern  . Not on file  Social History Narrative   Patient is married [name]) and lives at home with her husband.   Patient has one child.   Patient has a high school education.   Patient is right-handed.   Caffeine Use: Occasionally    Outpatient Medications Prior to Visit  Medication Sig Dispense Refill  . B Complex Vitamins (B-COMPLEX/B-12 PO) Take 2,500 mg by mouth daily.     Marland Kitchen BOTOX 100 units SOLR injection INJECT 155 UNITS INTO FACIAL AND NECK MUSCLES EVERY 3 MONTHS (GIVEN AT MD OFFICE, DISCARD UNUSED PORTION AFTER FIRST USE) 2 vial 3  . cyclobenzaprine (FLEXERIL) 10 MG tablet TAKE 1 TABLET BY MOUTH AT BEDTIME. CAN ALSO TAKE 1/2 -1 TABLET TWICE DAILY FOR HEADACHE 90 tablet 11  . levothyroxine (SYNTHROID) 75 MCG tablet Take 1 tablet by mouth daily.    . rizatriptan (MAXALT-MLT) 10 MG disintegrating tablet Take 1 tablet (10 mg total) by mouth as needed for migraine. May repeat in 2 hours if needed 15 tablet 11  . venlafaxine XR (EFFEXOR XR) 75 MG 24 hr capsule Take 1 capsule (75 mg total) by mouth daily with breakfast. 90 capsule 1   No facility-administered medications prior to visit.     Allergies  Allergen Reactions  . Statins Other (See Comments)    Muscle pain  . Codeine Nausea And Vomiting    Review of Systems  Constitutional: Positive for malaise/fatigue. Negative for chills and fever.  HENT: Negative for congestion and hearing loss.   Eyes: Negative for discharge.  Respiratory: Negative for cough, sputum production and shortness of breath.   Cardiovascular: Negative for chest pain, palpitations and leg swelling.  Gastrointestinal: Negative for abdominal pain, blood in stool, constipation, diarrhea, heartburn, nausea and vomiting.    Genitourinary: Negative for dysuria, frequency, hematuria and urgency.  Musculoskeletal: Negative for back pain, falls and myalgias.  Skin: Negative for rash.  Neurological: Negative for dizziness, sensory change, loss of consciousness, weakness and headaches.  Endo/Heme/Allergies: Negative for environmental allergies. Does not bruise/bleed easily.  Psychiatric/Behavioral: Positive for depression. Negative for suicidal ideas. The patient is nervous/anxious. The patient does not have insomnia.        Objective:    Physical Exam  Constitutional: She is oriented to person, place, and time. She appears well-developed and well-nourished. No distress.  HENT:  Head: Normocephalic and atraumatic.  Eyes: Conjunctivae are normal.  Neck: Neck supple. No thyromegaly present.  Cardiovascular: Normal  rate, regular rhythm and normal heart sounds.  No murmur heard. Pulmonary/Chest: Effort normal and breath sounds normal. No respiratory distress.  Abdominal: Soft. Bowel sounds are normal. She exhibits no distension and no mass. There is no tenderness.  Musculoskeletal: She exhibits no edema.  Lymphadenopathy:    She has no cervical adenopathy.  Neurological: She is alert and oriented to person, place, and time.  Skin: Skin is warm and dry.  Psychiatric: She has a normal mood and affect. Her behavior is normal.    BP 140/86 (BP Location: Left Arm, Patient Position: Sitting, Cuff Size: Normal)   Pulse 95   Temp 98.7 F (37.1 C) (Oral)   Resp 18   Wt 211 lb 3.2 oz (95.8 kg)   SpO2 98%   BMI 35.15 kg/m  Wt Readings from Last 3 Encounters:  09/03/17 211 lb 3.2 oz (95.8 kg)  06/09/17 187 lb (84.8 kg)  03/12/17 187 lb 9.6 oz (85.1 kg)     Lab Results  Component Value Date   WBC 4.0 09/23/2016   HGB 11.9 (L) 09/23/2016   HCT 36.3 09/23/2016   PLT 286.0 09/23/2016   GLUCOSE 97 09/01/2016   CHOL 194 09/01/2016   TRIG 104.0 09/01/2016   HDL 57.10 09/01/2016   LDLCALC 116 (H) 09/01/2016    ALT 19 09/01/2016   AST 31 09/01/2016   NA 141 09/01/2016   K 4.0 09/01/2016   CL 107 09/01/2016   CREATININE 0.90 09/01/2016   BUN 11 09/01/2016   CO2 28 09/01/2016   TSH 1.29 09/01/2016    Lab Results  Component Value Date   TSH 1.29 09/01/2016   Lab Results  Component Value Date   WBC 4.0 09/23/2016   HGB 11.9 (L) 09/23/2016   HCT 36.3 09/23/2016   MCV 79.2 09/23/2016   PLT 286.0 09/23/2016   Lab Results  Component Value Date   NA 141 09/01/2016   K 4.0 09/01/2016   CO2 28 09/01/2016   GLUCOSE 97 09/01/2016   BUN 11 09/01/2016   CREATININE 0.90 09/01/2016   BILITOT 0.4 09/01/2016   ALKPHOS 79 09/01/2016   AST 31 09/01/2016   ALT 19 09/01/2016   PROT 7.0 09/01/2016   ALBUMIN 4.1 09/01/2016   CALCIUM 9.5 09/01/2016   ANIONGAP 8 11/06/2015   GFR 65.99 09/01/2016   Lab Results  Component Value Date   CHOL 194 09/01/2016   Lab Results  Component Value Date   HDL 57.10 09/01/2016   Lab Results  Component Value Date   LDLCALC 116 (H) 09/01/2016   Lab Results  Component Value Date   TRIG 104.0 09/01/2016   Lab Results  Component Value Date   CHOLHDL 3 09/01/2016   No results found for: HGBA1C     Assessment & Plan:   Problem List Items Addressed This Visit    Hyperlipidemia    Encouraged heart healthy diet, increase exercise, avoid trans fats, consider a krill oil cap daily      Relevant Orders   Lipid panel   Hypothyroidism    On Levothyroxine, continue to monitor      Relevant Orders   TSH   Osteopenia    Encouraged to get adequate exercise, calcium and vitamin d intake      Relevant Orders   Comprehensive metabolic panel   Preventative health care    Patient encouraged to maintain heart healthy diet, regular exercise, adequate sleep. Consider daily probiotics. Take medications as prescribed. Declines pap has no history  of abnormal paps      Low ferritin    Check level      Relevant Orders   Ferritin   Depression with  anxiety    Very stressed due to brother died and her husband has worsening Parkinson's disease and she is the full time caregiver. She has been very stressed but is still taking th  Try increase the Venlafaxine ER 75 mg po bid.disp #180 with 1 rf      Relevant Medications   Venlafaxine HCl 150 MG TB24   Vitamin B12 deficiency    Check Vitamin B 12 level      Relevant Orders   Vitamin B12   Vitamin D deficiency    check      Relevant Orders   Comprehensive metabolic panel   VITAMIN D 25 Hydroxy (Vit-D Deficiency, Fractures)   Anemia    Increase leafy greens, consider increased lean red meat and using cast iron cookware. Continue to monitor, report any concerns      Relevant Orders   CBC   Ferritin   Retic    Other Visit Diagnoses    Breast cancer screening    -  Primary   Relevant Orders   MM SCREENING BREAST TOMO BILATERAL   Postmenopausal       Relevant Orders   DG Bone Density   Encounter for Medicare annual wellness exam          I have discontinued Kayna Stovall-Edwards's venlafaxine XR. I am also having her start on Venlafaxine HCl. Additionally, I am having her maintain her B Complex Vitamins (B-COMPLEX/B-12 PO), rizatriptan, cyclobenzaprine, BOTOX, and levothyroxine.  Meds ordered this encounter  Medications  . Venlafaxine HCl 150 MG TB24    Sig: Take 1 tablet (150 mg total) by mouth daily.    Dispense:  30 each    Refill:  5     Penni Homans, MD

## 2017-09-03 NOTE — Assessment & Plan Note (Signed)
Is on schedule to have a repeat colonoscopy in 3 years

## 2017-09-03 NOTE — Assessment & Plan Note (Addendum)
Very stressed due to brother died and her husband has worsening Parkinson's disease and she is the full time caregiver. She has been very stressed but is still taking th  Try increase the Venlafaxine ER 75 mg po bid.disp #180 with 1 rf

## 2017-09-03 NOTE — Assessment & Plan Note (Signed)
Check Vitamin B 12 level

## 2017-09-03 NOTE — Assessment & Plan Note (Signed)
>>  ASSESSMENT AND PLAN FOR HYPOTHYROIDISM WRITTEN ON 09/03/2017  1:51 PM BY BLYTH, STACEY A, MD  On Levothyroxine, continue to monitor

## 2017-09-04 LAB — LIPID PANEL
CHOL/HDL RATIO: 4
CHOLESTEROL: 221 mg/dL — AB (ref 0–200)
HDL: 59.1 mg/dL (ref >39.00)
LDL CALC: 126 mg/dL — AB (ref 0–99)
NonHDL: 161.68
TRIGLYCERIDES: 180 mg/dL — AB (ref 0.0–149.0)
VLDL: 36 mg/dL (ref 0.0–40.0)

## 2017-09-04 LAB — COMPREHENSIVE METABOLIC PANEL
ALBUMIN: 4.1 g/dL (ref 3.5–5.2)
ALT: 17 U/L (ref 0–35)
AST: 26 U/L (ref 0–37)
Alkaline Phosphatase: 109 U/L (ref 39–117)
BUN: 12 mg/dL (ref 6–23)
CALCIUM: 9 mg/dL (ref 8.4–10.5)
CHLORIDE: 105 meq/L (ref 96–112)
CO2: 29 meq/L (ref 19–32)
Creatinine, Ser: 0.99 mg/dL (ref 0.40–1.20)
GFR: 58.94 mL/min — AB (ref >60.00)
Glucose, Bld: 71 mg/dL (ref 70–99)
POTASSIUM: 4 meq/L (ref 3.5–5.1)
Sodium: 141 mEq/L (ref 135–145)
Total Bilirubin: 0.4 mg/dL (ref 0.2–1.2)
Total Protein: 7.3 g/dL (ref 6.0–8.3)

## 2017-09-04 LAB — CBC
HEMATOCRIT: 36.6 % (ref 36.0–46.0)
Hemoglobin: 11.5 g/dL — ABNORMAL LOW (ref 12.0–15.0)
MCHC: 31.6 g/dL (ref 30.0–36.0)
MCV: 84.2 fl (ref 78.0–100.0)
PLATELETS: 340 10*3/uL (ref 150.0–400.0)
RBC: 4.34 Mil/uL (ref 3.87–5.11)
RDW: 16.8 % — ABNORMAL HIGH (ref 11.5–15.5)
WBC: 6.1 10*3/uL (ref 4.0–10.5)

## 2017-09-04 LAB — VITAMIN B12: VITAMIN B 12: 113 pg/mL — AB (ref 211–911)

## 2017-09-04 LAB — VITAMIN D 25 HYDROXY (VIT D DEFICIENCY, FRACTURES): VITD: 22.37 ng/mL — ABNORMAL LOW (ref 30.00–100.00)

## 2017-09-04 LAB — TSH: TSH: 2.31 u[IU]/mL (ref 0.35–4.50)

## 2017-09-04 LAB — FERRITIN: Ferritin: 6.1 ng/mL — ABNORMAL LOW (ref 10.0–291.0)

## 2017-09-16 ENCOUNTER — Encounter: Payer: Self-pay | Admitting: Neurology

## 2017-09-16 ENCOUNTER — Ambulatory Visit (INDEPENDENT_AMBULATORY_CARE_PROVIDER_SITE_OTHER): Payer: Medicare Other | Admitting: Neurology

## 2017-09-16 ENCOUNTER — Telehealth: Payer: Self-pay | Admitting: Neurology

## 2017-09-16 VITALS — BP 132/88 | HR 80

## 2017-09-16 DIAGNOSIS — G43719 Chronic migraine without aura, intractable, without status migrainosus: Secondary | ICD-10-CM | POA: Diagnosis not present

## 2017-09-16 NOTE — Progress Notes (Signed)
Botox-100 units x 2 vials Lot: [id number] Expiration: 01/2020 NDC: 3790-2409-73  Bacteriostatic 0.9% Sodium Chloride- 78mL total Lot: [id number] Expiration: 01/14/2019 NDC: 4268-3419-62  Dx: I29.798 S/P //BCrn

## 2017-09-16 NOTE — Telephone Encounter (Signed)
Please call pt to schedule BOTOX appt. Thank you. °

## 2017-09-16 NOTE — Progress Notes (Signed)

## 2017-09-17 ENCOUNTER — Other Ambulatory Visit: Payer: Self-pay

## 2017-09-17 MED ORDER — VENLAFAXINE HCL ER 150 MG PO TB24: 150.0000 mg | ORAL_TABLET | Freq: Every day | ORAL | 5 refills | Status: DC

## 2017-10-06 NOTE — Telephone Encounter (Signed)
I called and scheduled the patient for her next injection.  °

## 2017-10-29 ENCOUNTER — Encounter (INDEPENDENT_AMBULATORY_CARE_PROVIDER_SITE_OTHER): Payer: Medicare Other

## 2017-11-02 ENCOUNTER — Encounter (INDEPENDENT_AMBULATORY_CARE_PROVIDER_SITE_OTHER): Payer: Self-pay | Admitting: Family Medicine

## 2017-11-02 ENCOUNTER — Ambulatory Visit (INDEPENDENT_AMBULATORY_CARE_PROVIDER_SITE_OTHER): Payer: Medicare Other | Admitting: Family Medicine

## 2017-11-02 VITALS — BP 136/86 | HR 82 | Temp 98.3°F | Ht 65.0 in | Wt 215.0 lb

## 2017-11-02 DIAGNOSIS — E063 Autoimmune thyroiditis: Secondary | ICD-10-CM

## 2017-11-02 DIAGNOSIS — E038 Other specified hypothyroidism: Secondary | ICD-10-CM

## 2017-11-02 DIAGNOSIS — E7849 Other hyperlipidemia: Secondary | ICD-10-CM

## 2017-11-02 DIAGNOSIS — E538 Deficiency of other specified B group vitamins: Secondary | ICD-10-CM

## 2017-11-02 DIAGNOSIS — Z6835 Body mass index (BMI) 35.0-35.9, adult: Secondary | ICD-10-CM

## 2017-11-02 DIAGNOSIS — R5383 Other fatigue: Secondary | ICD-10-CM | POA: Insufficient documentation

## 2017-11-02 DIAGNOSIS — Z1331 Encounter for screening for depression: Secondary | ICD-10-CM | POA: Diagnosis not present

## 2017-11-02 DIAGNOSIS — E559 Vitamin D deficiency, unspecified: Secondary | ICD-10-CM | POA: Diagnosis not present

## 2017-11-02 DIAGNOSIS — Z0289 Encounter for other administrative examinations: Secondary | ICD-10-CM

## 2017-11-02 DIAGNOSIS — R0602 Shortness of breath: Secondary | ICD-10-CM | POA: Diagnosis not present

## 2017-11-02 HISTORY — DX: Shortness of breath: R06.02

## 2017-11-02 HISTORY — DX: Other fatigue: R53.83

## 2017-11-02 NOTE — Progress Notes (Signed)
.  Office: [phone]  /  Fax: [phone]   HPI:   Chief Complaint: OBESITY  [name] Nolan (MR# [id number]) is a 72 y.o. female who presents on 11/02/2017 for obesity evaluation and treatment. Current BMI is Body mass index is 35.78 kg/m.Marland Kitchen [name] Nolan has struggled with obesity for years and has been unsuccessful in either losing weight or maintaining long term weight loss. [name] Nolan attended our information session and states she is currently in the action stage of change and ready to dedicate time achieving and maintaining a healthier weight.   [name] Nolan states her family eats meals together she thinks her family will eat healthier with  her her desired weight loss is 55 lbs she started gaining weight when remarried in 2015 her heaviest weight ever was 219 lbs she is a picky eater and doesn't like to eat healthier foods  she has significant food cravings issues  she snacks frequently in the evenings she skips meals frequently she frequently makes poor food choices she struggles with emotional eating    Fatigue [name] Nolan feels her energy is lower than it should be. This has worsened with weight gain and has not worsened recently. Dell admits to daytime somnolence and  admits to waking up still tired. Patient is at risk for obstructive sleep apnea. Patent has a history of symptoms of daytime fatigue. Patient generally gets 8 hours of sleep per night, and states they generally have generally restful sleep. Snoring is present. Apneic episodes are not present. Epworth Sleepiness Score is 3.  Dyspnea on exertion [name] Nolan notes increasing shortness of breath with exercising and seems to be worsening over time with weight gain. She notes getting out of breath sooner with activity than she used to. This has not gotten worse recently. [name] Nolan denies orthopnea.  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 She is on Synthroid and she admits fatigue, but denies palpitations and  admits heat intolerance.  Hyperlipidemia [name] Nolan has hyperlipidemia and has been trying to improve her cholesterol levels with intensive lifestyle modification including a low saturated fat diet, exercise and weight loss. [name] Nolan states she gets severe myalgias in her legs on statins to where she cannot walk. She denies any chest pain or claudication.  Vitamin D Deficiency [name] Nolan has a diagnosis of vitamin D deficiency. She is not on Vit D, no recent labs. She notes fatigue and denies nausea, vomiting or muscle weakness.  Vitamin B12 Deficiency [name] Nolan has a diagnosis of B12 insufficiency and notes fatigue. [name] Nolan has no history of anemia and notes fatigue. She is not a vegetarian and not on metformin or B12 vitamin. She does not have a history of weight loss surgery.   Depression Screen [name] Nolan's Food and Mood (modified PHQ-9) score was  Depression screen PHQ 2/9 11/02/2017  Decreased Interest 3  Down, Depressed, Hopeless 3  PHQ - 2 Score 6  Altered sleeping 3  Tired, decreased energy 3  Change in appetite 3  Feeling bad or failure about yourself  1  Trouble concentrating 1  Moving slowly or fidgety/restless 0  Suicidal thoughts 0  PHQ-9 Score 17  Difficult doing work/chores Somewhat difficult    ALLERGIES: Allergies  Allergen Reactions  . Statins Other (See Comments)    Muscle pain  . Codeine Nausea And Vomiting    MEDICATIONS: Current Outpatient Medications on File Prior to Visit  Medication Sig Dispense Refill  . BOTOX 100 units SOLR injection INJECT 155 UNITS INTO FACIAL AND NECK MUSCLES EVERY 3 MONTHS (GIVEN AT MD OFFICE,  DISCARD UNUSED PORTION AFTER FIRST USE) 2 vial 3  . cyclobenzaprine (FLEXERIL) 10 MG tablet TAKE 1 TABLET BY MOUTH AT BEDTIME. CAN ALSO TAKE 1/2 -1 TABLET TWICE DAILY FOR HEADACHE 90 tablet 11  . levothyroxine (SYNTHROID) 75 MCG tablet Take 1 tablet by mouth daily.    . rizatriptan (MAXALT-MLT) 10 MG disintegrating tablet Take 1 tablet (10 mg total) by  mouth as needed for migraine. May repeat in 2 hours if needed 15 tablet 11  . Venlafaxine HCl 150 MG TB24 Take 1 tablet (150 mg total) by mouth daily. 30 each 5   No current facility-administered medications on file prior to visit.     PAST MEDICAL HISTORY: Past Medical History:  Diagnosis Date  . Acute bronchitis 08/15/2015  . Anxiety   . Anxiety state 05/07/2014   Widowed in 2013 after caring for her husband with Lewy Body Dementia for 6 years   . Arrhythmia 01/25/2015   Per Dr. Jaynee Eagles, [facility]; hx PVC  . Arthritis    "knees" (11/07/2015)  . 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 Basal cell carcinoma of right ear 02/26/2015   Removed by Dr [place]  . Chronic back pain    "mid-back; stops at the very lowest part of my back" (11/07/2015)  . Colon polyp 09/03/2017  . Constipation 02/25/2016  . Depression   . Dysphagia   . Dyspnea   . Esophageal reflux    occ  . Fainting    fainted twice  . Family history of adverse reaction to anesthesia    "daughter gets bad PONV"  . Gallbladder problem   . Hashimoto's disease   . Heart murmur   . Hot flashes 05/27/2016  . Hyperlipidemia   . Hypothyroid   . Insomnia   . Joint pain   . Low ferritin 08/27/2015   "took supplements for awhile" (11/07/2015)  . Medicare annual wellness visit, subsequent 08/27/2015  . Menopause   . Migraine    "under control w/daily RX right now" (11/07/2015)  . Occipital neuralgia   . Osteopenia 02/26/2015  . Osteoporosis    osteopenia  . Preventative health care 08/27/2015  . Prolonged depressive reaction   . Swallowing difficulty   . Vitamin B12 deficiency 09/01/2016  . Vitamin D deficiency 03/02/2017    PAST SURGICAL HISTORY: Past Surgical History:  Procedure Laterality Date  . APPENDECTOMY  11/07/2015  . BASAL CELL CARCINOMA EXCISION Right 02/26/2015   ear  . CHOLECYSTECTOMY OPEN  1974  . COLONOSCOPY  2006  . LAPAROSCOPIC APPENDECTOMY N/A 11/07/2015   Procedure: APPENDECTOMY  LAPAROSCOPIC;  Surgeon: Georganna Skeans, MD;  Location: [facility];  Service: General;  Laterality: N/A;  . LAPAROSCOPIC INCISIONAL / UMBILICAL / [place]  11/07/2015   UHR  . Tooth implant     at least 5 years ago per pt  . TUBAL LIGATION  1973  . UMBILICAL HERNIA REPAIR N/A 11/07/2015   Procedure: LAPAROSCOPIC UMBILICAL HERNIA;  Surgeon: Georganna Skeans, MD;  Location: [facility];  Service: General;  Laterality: N/A;    SOCIAL HISTORY: Social History   Tobacco Use  . Smoking status: Never Smoker  . Smokeless tobacco: Never Used  Substance Use Topics  . Alcohol use: No    Alcohol/week: 0.0 oz  . Drug use: No    FAMILY HISTORY: Family History  Problem Relation Age of Onset  . Congestive Heart Failure Mother   . Hypertension Mother   . Hyperlipidemia Mother   . Heart disease Mother   .  Obesity Mother   . Leukemia Father   . Obesity Father   . Kidney disease Brother   . Cancer Brother        stage 4 kidney cancer, metastatic  . Kidney cancer Brother   . Leukemia Maternal Aunt   . Congestive Heart Failure Maternal Grandmother   . Arthritis Sister   . Colon cancer Neg Hx     ROS: Review of Systems  Constitutional: Positive for malaise/fatigue. Negative for weight loss.       Positive heat intolerance + Trouble sleeping  HENT: Positive for tinnitus.        + Decreased hearing + Nasal stuffiness + Hoarseness  Eyes:       + Wear glasses or contacts  Respiratory: Positive for cough and shortness of breath (with exertion).   Cardiovascular: Negative for chest pain, palpitations, orthopnea and claudication.  Gastrointestinal: Negative for nausea and vomiting.       + Swallowing difficulty  Musculoskeletal: Positive for myalgias.       Negative muscle weakness  Skin:       + Dryness  Psychiatric/Behavioral: Positive for depression. Negative for suicidal ideas. The patient is nervous/anxious.        + Stress    PHYSICAL EXAM: Blood pressure 136/86, pulse 82,  temperature 98.3 F (36.8 C), temperature source Oral, height 5\' 5"  (1.651 m), weight 215 lb (97.5 kg), SpO2 99 %. Body mass index is 35.78 kg/m. Physical Exam  Constitutional: She is oriented to person, place, and time. She appears well-developed and well-nourished.  HENT:  Head: Normocephalic and atraumatic.  Nose: Nose normal.  Eyes: EOM are normal. No scleral icterus.  Neck: Normal range of motion. Neck supple. No thyromegaly present.  Cardiovascular: Normal rate and regular rhythm.  Pulmonary/Chest: Effort normal. No respiratory distress.  Abdominal: Soft. There is no tenderness.  + Obesity  Musculoskeletal:  Range of Motion normal in all 4 extremities Trace edema noted in bilateral extremities  Neurological: She is alert and oriented to person, place, and time. Coordination normal.  Skin: Skin is warm and dry.  Psychiatric: She has a normal mood and affect. Her behavior is normal.  Vitals reviewed.   RECENT LABS AND TESTS: BMET    Component Value Date/Time   NA 141 09/03/2017 1446   K 4.0 09/03/2017 1446   CL 105 09/03/2017 1446   CO2 29 09/03/2017 1446   GLUCOSE 71 09/03/2017 1446   BUN 12 09/03/2017 1446   CREATININE 0.99 09/03/2017 1446   CALCIUM 9.0 09/03/2017 1446   GFRNONAA >60 11/07/2015 1324   GFRAA >60 11/07/2015 1324   No results found for: HGBA1C No results found for: INSULIN CBC    Component Value Date/Time   WBC 6.1 09/03/2017 1446   RBC 4.34 09/03/2017 1446   HGB 11.5 (L) 09/03/2017 1446   HGB 12.2 04/25/2015 1533   HCT 36.6 09/03/2017 1446   HCT 37.9 04/25/2015 1533   PLT 340.0 09/03/2017 1446   PLT 272 04/25/2015 1533   MCV 84.2 09/03/2017 1446   MCV 87 04/25/2015 1533   MCH 29.5 11/07/2015 1324   MCHC 31.6 09/03/2017 1446   RDW 16.8 (H) 09/03/2017 1446   RDW 15.5 (H) 04/25/2015 1533   LYMPHSABS 1.9 12/13/2014 1621   MONOABS 0.5 12/13/2014 1621   EOSABS 0.1 12/13/2014 1621   BASOSABS 0.1 12/13/2014 1621   Iron/TIBC/Ferritin/ %Sat     Component Value Date/Time   FERRITIN 6.1 (L) 09/03/2017 1446   FERRITIN 36  05/24/2015 1535   Lipid Panel     Component Value Date/Time   CHOL 221 (H) 09/03/2017 1446   TRIG 180.0 (H) 09/03/2017 1446   HDL 59.10 09/03/2017 1446   CHOLHDL 4 09/03/2017 1446   VLDL 36.0 09/03/2017 1446   LDLCALC 126 (H) 09/03/2017 1446   Hepatic Function Panel     Component Value Date/Time   PROT 7.3 09/03/2017 1446   ALBUMIN 4.1 09/03/2017 1446   AST 26 09/03/2017 1446   ALT 17 09/03/2017 1446   ALKPHOS 109 09/03/2017 1446   BILITOT 0.4 09/03/2017 1446      Component Value Date/Time   TSH 2.31 09/03/2017 1446   Vitamin D No recent labs  ECG  shows NSR with a rate of 80 BPM INDIRECT CALORIMETER done today shows a VO2 of 187 and a REE of 1299. Her calculated basal metabolic rate is 1610 thus her basal metabolic rate is worse than expected.    ASSESSMENT AND PLAN: Other fatigue - Plan: EKG 12-Lead, CBC With Differential, Comprehensive metabolic panel, Hemoglobin A1c, Insulin, random  Shortness of breath on exertion - Plan: CBC With Differential  Hypothyroidism due to Hashimoto's thyroiditis - Plan: T3, T4, free, TSH  Other hyperlipidemia - Plan: Lipid Panel With LDL/HDL Ratio  Vitamin D deficiency - Plan: VITAMIN D 25 Hydroxy (Vit-D Deficiency, Fractures)  B12 nutritional deficiency - Plan: Vitamin B12  Depression screening  Class 2 severe obesity with serious comorbidity and body mass index (BMI) of 35.0 to 35.9 in adult, unspecified obesity type (HCC)  PLAN:  Fatigue [name] Nolan was informed that her fatigue may be related to obesity, depression or many other causes. Labs will be ordered, and in the meanwhile [name] Nolan has agreed to work on diet, exercise and weight loss to help with fatigue. Proper sleep hygiene was discussed including the need for 7-8 hours of quality sleep each night. A sleep study was not ordered based on symptoms and Epworth score.  Dyspnea on exertion [name] Nolan's  shortness of breath appears to be obesity related and exercise induced. She has agreed to work on weight loss and gradually increase exercise to treat her exercise induced shortness of breath. If [name] Nolan follows our instructions and loses weight without improvement of her shortness of breath, we will plan to refer to pulmonology. We will monitor this condition regularly. [name] Nolan agrees to this plan.  Hashimoto's Hypothyroid [name] Nolan was informed of the importance of good thyroid control to help with weight loss efforts. She was also informed that supertheraputic thyroid levels are dangerous and will not improve weight loss results. [name] Nolan agrees to continue taking Synthroid as prescribed. We will check labs and [name] Nolan agrees to follow up with our clinic in 2 weeks.  Hyperlipidemia [name] Nolan was informed of the American Heart Association Guidelines emphasizing intensive lifestyle modifications as the first line treatment for hyperlipidemia. We discussed many lifestyle modifications today in depth, and [name] Nolan will continue to work on decreasing saturated fats such as fatty red meat, butter and many fried foods. She will also increase vegetables and lean protein in her diet and continue to work on exercise and weight loss efforts. We will check labs and [name] Nolan agrees to follow up with our clinic in  2 weeks.  Vitamin D Deficiency [name] Nolan was informed that low vitamin D levels contributes to fatigue and are associated with obesity, breast, and colon cancer. She will follow up for routine testing of vitamin D, at least 2-3 times per year. She was informed of the risk of  over-replacement of vitamin D and agrees to not increase her dose unless she discusses this with [place] first. We will check labs and [name] Nolan agrees to follow up with our clinic in 2 weeks.  Vitamin B12 Deficiency [name] Nolan will work on increasing B12 rich foods in her diet. B12 supplementation was not prescribed today. We will check labs and [name] Nolan agrees  to follow up with our clinic in 2 weeks.  Depression Screen [name] Nolan had a strongly positive depression screening. Depression is commonly associated with obesity and often results in emotional eating behaviors. We will monitor this closely and work on CBT to help improve the non-hunger eating patterns. Referral to Psychology may be required if no improvement is seen as she continues in our clinic.  Obesity Pasha is currently in the action stage of change and her goal is to continue with weight loss efforts She has agreed to follow the Category 2 plan Girl has been instructed to work up to a goal of 150 minutes of combined cardio and strengthening exercise per week for weight loss and overall health benefits. We discussed the following Behavioral Modification Strategies today: increasing lean protein intake and work on meal planning and easy cooking plans  Marshay has agreed to follow up with our clinic in 2 weeks. She was informed of the importance of frequent follow up visits to maximize her success with intensive lifestyle modifications for her multiple health conditions. She was informed we would discuss her lab results at her next visit unless there is a critical issue that needs to be addressed sooner. Trude agreed to keep her next visit at the agreed upon time to discuss these results.    OBESITY BEHAVIORAL INTERVENTION VISIT  Today's visit was # 1 out of 22.  Starting weight: 215 lbs Starting date: 11/02/17 Today's weight : 215 lbs  Today's date: 11/02/2017 Total lbs lost to date: 0 (Patients must lose 7 lbs in the first 6 months to continue with counseling)   ASK: We discussed the diagnosis of obesity with Indiah Stovall-Edwards today and Haylea agreed to give [place] permission to discuss obesity behavioral modification therapy today.  ASSESS: Lovelle has the diagnosis of obesity and her BMI today is 35.78 Ernesto is in the action stage of change   ADVISE: Dezerae was educated on  the multiple health risks of obesity as well as the benefit of weight loss to improve her health. She was advised of the need for long term treatment and the importance of lifestyle modifications.  AGREE: Multiple dietary modification options and treatment options were discussed and  Jhanvi agreed to the above obesity treatment plan.   I, Trixie Dredge, am acting as transcriptionist for Dennard Nip, MD   I have reviewed the above documentation for accuracy and completeness, and I agree with the above. -Dennard Nip, MD

## 2017-11-03 LAB — COMPREHENSIVE METABOLIC PANEL
A/G RATIO: 1.5 (ref 1.2–2.2)
ALT: 17 IU/L (ref 0–32)
AST: 26 IU/L (ref 0–40)
Albumin: 4.4 g/dL (ref 3.5–4.8)
Alkaline Phosphatase: 117 IU/L (ref 39–117)
BUN/Creatinine Ratio: 10 — ABNORMAL LOW (ref 12–28)
BUN: 10 mg/dL (ref 8–27)
Bilirubin Total: 0.2 mg/dL (ref 0.0–1.2)
CALCIUM: 9.8 mg/dL (ref 8.7–10.3)
CO2: 21 mmol/L (ref 20–29)
Chloride: 101 mmol/L (ref 96–106)
Creatinine, Ser: 0.97 mg/dL (ref 0.57–1.00)
GFR, EST AFRICAN AMERICAN: 68 mL/min/{1.73_m2} (ref >59)
GFR, EST NON AFRICAN AMERICAN: 59 mL/min/{1.73_m2} — AB (ref >59)
GLOBULIN, TOTAL: 2.9 g/dL (ref 1.5–4.5)
Glucose: 85 mg/dL (ref 65–99)
POTASSIUM: 4.4 mmol/L (ref 3.5–5.2)
SODIUM: 141 mmol/L (ref 134–144)
TOTAL PROTEIN: 7.3 g/dL (ref 6.0–8.5)

## 2017-11-03 LAB — CBC WITH DIFFERENTIAL
BASOS: 1 %
Basophils Absolute: 0.1 10*3/uL (ref 0.0–0.2)
EOS (ABSOLUTE): 0.2 10*3/uL (ref 0.0–0.4)
Eos: 3 %
Hematocrit: 33.8 % — ABNORMAL LOW (ref 34.0–46.6)
Hemoglobin: 10.4 g/dL — ABNORMAL LOW (ref 11.1–15.9)
IMMATURE GRANS (ABS): 0 10*3/uL (ref 0.0–0.1)
Immature Granulocytes: 0 %
LYMPHS: 23 %
Lymphocytes Absolute: 1.4 10*3/uL (ref 0.7–3.1)
MCH: 24.9 pg — ABNORMAL LOW (ref 26.6–33.0)
MCHC: 30.8 g/dL — ABNORMAL LOW (ref 31.5–35.7)
MCV: 81 fL (ref 79–97)
MONOS ABS: 0.4 10*3/uL (ref 0.1–0.9)
Monocytes: 6 %
Neutrophils Absolute: 4 10*3/uL (ref 1.4–7.0)
Neutrophils: 67 %
RBC: 4.18 x10E6/uL (ref 3.77–5.28)
RDW: 15.7 % — ABNORMAL HIGH (ref 12.3–15.4)
WBC: 6 10*3/uL (ref 3.4–10.8)

## 2017-11-03 LAB — LIPID PANEL WITH LDL/HDL RATIO
Cholesterol, Total: 229 mg/dL — ABNORMAL HIGH (ref 100–199)
HDL: 57 mg/dL (ref >39)
LDL Calculated: 136 mg/dL — ABNORMAL HIGH (ref 0–99)
LDL/HDL RATIO: 2.4 ratio (ref 0.0–3.2)
Triglycerides: 182 mg/dL — ABNORMAL HIGH (ref 0–149)
VLDL Cholesterol Cal: 36 mg/dL (ref 5–40)

## 2017-11-03 LAB — INSULIN, RANDOM: INSULIN: 12.4 u[IU]/mL (ref 2.6–24.9)

## 2017-11-03 LAB — VITAMIN B12: Vitamin B-12: 215 pg/mL — ABNORMAL LOW (ref 232–1245)

## 2017-11-03 LAB — HEMOGLOBIN A1C
Est. average glucose Bld gHb Est-mCnc: 108 mg/dL
Hgb A1c MFr Bld: 5.4 % (ref 4.8–5.6)

## 2017-11-03 LAB — TSH: TSH: 5.88 u[IU]/mL — ABNORMAL HIGH (ref 0.450–4.500)

## 2017-11-03 LAB — T4, FREE: FREE T4: 1.04 ng/dL (ref 0.82–1.77)

## 2017-11-03 LAB — T3: T3 TOTAL: 98 ng/dL (ref 71–180)

## 2017-11-03 LAB — VITAMIN D 25 HYDROXY (VIT D DEFICIENCY, FRACTURES): Vit D, 25-Hydroxy: 25.3 ng/mL — ABNORMAL LOW (ref 30.0–100.0)

## 2017-11-04 ENCOUNTER — Other Ambulatory Visit: Payer: Self-pay | Admitting: Family Medicine

## 2017-11-17 ENCOUNTER — Ambulatory Visit (INDEPENDENT_AMBULATORY_CARE_PROVIDER_SITE_OTHER): Payer: Medicare Other | Admitting: Family Medicine

## 2017-11-17 VITALS — BP 128/79 | HR 95 | Temp 97.5°F | Ht 65.0 in | Wt 214.0 lb

## 2017-11-17 DIAGNOSIS — Z6835 Body mass index (BMI) 35.0-35.9, adult: Secondary | ICD-10-CM | POA: Diagnosis not present

## 2017-11-17 DIAGNOSIS — D508 Other iron deficiency anemias: Secondary | ICD-10-CM

## 2017-11-17 DIAGNOSIS — E538 Deficiency of other specified B group vitamins: Secondary | ICD-10-CM | POA: Diagnosis not present

## 2017-11-17 DIAGNOSIS — E8881 Metabolic syndrome: Secondary | ICD-10-CM | POA: Diagnosis not present

## 2017-11-17 DIAGNOSIS — D649 Anemia, unspecified: Secondary | ICD-10-CM | POA: Insufficient documentation

## 2017-11-17 DIAGNOSIS — E038 Other specified hypothyroidism: Secondary | ICD-10-CM

## 2017-11-17 DIAGNOSIS — E559 Vitamin D deficiency, unspecified: Secondary | ICD-10-CM

## 2017-11-17 HISTORY — DX: Anemia, unspecified: D64.9

## 2017-11-17 MED ORDER — VITAMIN D (ERGOCALCIFEROL) 1.25 MG (50000 UNIT) PO CAPS: 50000.0000 [IU] | ORAL_CAPSULE | ORAL | 0 refills | Status: DC

## 2017-11-17 NOTE — Progress Notes (Signed)
Office: [phone]  /  Fax: [phone]   HPI:   Chief Complaint: OBESITY [name] Nolan is here to discuss her progress with her obesity treatment plan. She is on the Category 2 plan and is following her eating plan approximately 90 % of the time. She states she is walking for 30-60 minutes 5-7 times per week. [name] Nolan struggled to eat all her protein at dinner. She snacked on sweet things and feels deprived at times. She is exhausted with her caregiver responsibilities and is very tempted to use food for comfort.  Her weight is 214 lb (97.1 kg) today and has had a weight loss of 1 pound over a period of 2 weeks since her last visit. She has lost 1 lbs since starting treatment with [place].  Vitamin D Deficiency [name] Nolan has a new diagnosis of vitamin D deficiency. She is not on Vit D, she notes fatigue and denies nausea, vomiting or muscle weakness.  Vitamin B12 Deficiency [name] Nolan has a diagnosis of B12 insufficiency and notes fatigue. She is on OTC B12 sublingual tabs (5,000 mg), level slowly improving but not yet at goal. She traditionally doesn't like to eat much meat. She does not have a previous diagnosis of pernicious anemia. She does not have a history of weight loss surgery.   Iron Deficiency Anemia [name] Nolan has a diagnosis of anemia. She states she donated 1 pint of blood before having her labs done. She notes fatigue.  Hypothyroid [name] Nolan has a diagnosis of hypothyroidism. Her TSH slightly elevated, T3 and T4 on lower end of normal but she feels significant fatigue. She is on Synthroid. She denies hot or cold intolerance or palpitations.  Insulin Resistance [name] Nolan has a new diagnosis of insulin resistance based on her elevated fasting insulin level >5. Glucose and A1c well controlled but fasting insulin mildly elevated, she notes polyphagia worse in evening. Although [name] Nolan's blood glucose readings are still under good control, insulin resistance puts her at greater risk of metabolic syndrome  and diabetes. She is not taking metformin currently and continues to work on diet and exercise to decrease risk of diabetes.  ALLERGIES: Allergies  Allergen Reactions  . Statins Other (See Comments)    Muscle pain  . Codeine Nausea And Vomiting    MEDICATIONS: Current Outpatient Medications on File Prior to Visit  Medication Sig Dispense Refill  . BOTOX 100 units SOLR injection INJECT 155 UNITS INTO FACIAL AND NECK MUSCLES EVERY 3 MONTHS (GIVEN AT MD OFFICE, DISCARD UNUSED PORTION AFTER FIRST USE) 2 vial 3  . cyclobenzaprine (FLEXERIL) 10 MG tablet TAKE 1 TABLET BY MOUTH AT BEDTIME. CAN ALSO TAKE 1/2 -1 TABLET TWICE DAILY FOR HEADACHE 90 tablet 11  . levothyroxine (SYNTHROID) 75 MCG tablet Take 1 tablet by mouth daily.    . rizatriptan (MAXALT-MLT) 10 MG disintegrating tablet Take 1 tablet (10 mg total) by mouth as needed for migraine. May repeat in 2 hours if needed 15 tablet 11  . Venlafaxine HCl 150 MG TB24 Take 1 tablet (150 mg total) by mouth daily. 30 each 5  . venlafaxine XR (EFFEXOR-XR) 75 MG 24 hr capsule TAKE 1 CAPSULE (75 MG TOTAL) BY MOUTH DAILY WITH BREAKFAST. 30 capsule 1   No current facility-administered medications on file prior to visit.     PAST MEDICAL HISTORY: Past Medical History:  Diagnosis Date  . Acute bronchitis 08/15/2015  . Anxiety   . Anxiety state 05/07/2014   Widowed in 2013 after caring for her husband with Lewy Body Dementia for  6 years   . Arrhythmia 01/25/2015   Per Dr. Jaynee Eagles, [facility]; hx PVC  . Arthritis    "knees" (11/07/2015)  . 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 Basal cell carcinoma of right ear 02/26/2015   Removed by Dr Syble Creek  . Chronic back pain    "mid-back; stops at the very lowest part of my back" (11/07/2015)  . Colon polyp 09/03/2017  . Constipation 02/25/2016  . Depression   . Dysphagia   . Dyspnea   . Esophageal reflux    occ  . Fainting    fainted twice  . Family history of adverse reaction to  anesthesia    "daughter gets bad PONV"  . Gallbladder problem   . Hashimoto's disease   . Heart murmur   . Hot flashes 05/27/2016  . Hyperlipidemia   . Hypothyroid   . Insomnia   . Joint pain   . Low ferritin 08/27/2015   "took supplements for awhile" (11/07/2015)  . Medicare annual wellness visit, subsequent 08/27/2015  . Menopause   . Migraine    "under control w/daily RX right now" (11/07/2015)  . Occipital neuralgia   . Osteopenia 02/26/2015  . Osteoporosis    osteopenia  . Preventative health care 08/27/2015  . Prolonged depressive reaction   . Swallowing difficulty   . Vitamin B12 deficiency 09/01/2016  . Vitamin D deficiency 03/02/2017    PAST SURGICAL HISTORY: Past Surgical History:  Procedure Laterality Date  . APPENDECTOMY  11/07/2015  . BASAL CELL CARCINOMA EXCISION Right 02/26/2015   ear  . CHOLECYSTECTOMY OPEN  1974  . COLONOSCOPY  2006  . LAPAROSCOPIC APPENDECTOMY N/A 11/07/2015   Procedure: APPENDECTOMY LAPAROSCOPIC;  Surgeon: Georganna Skeans, MD;  Location: [facility];  Service: General;  Laterality: N/A;  . LAPAROSCOPIC INCISIONAL / UMBILICAL / [place]  11/07/2015   UHR  . Tooth implant     at least 5 years ago per pt  . TUBAL LIGATION  1973  . UMBILICAL HERNIA REPAIR N/A 11/07/2015   Procedure: LAPAROSCOPIC UMBILICAL HERNIA;  Surgeon: Georganna Skeans, MD;  Location: [facility];  Service: General;  Laterality: N/A;    SOCIAL HISTORY: Social History   Tobacco Use  . Smoking status: Never Smoker  . Smokeless tobacco: Never Used  Substance Use Topics  . Alcohol use: No    Alcohol/week: 0.0 oz  . Drug use: No    FAMILY HISTORY: Family History  Problem Relation Age of Onset  . Congestive Heart Failure Mother   . Hypertension Mother   . Hyperlipidemia Mother   . Heart disease Mother   . Obesity Mother   . Leukemia Father   . Obesity Father   . Kidney disease Brother   . Cancer Brother        stage 4 kidney cancer, metastatic  . Kidney cancer  Brother   . Leukemia Maternal Aunt   . Congestive Heart Failure Maternal Grandmother   . Arthritis Sister   . Colon cancer Neg Hx     ROS: Review of Systems  Constitutional: Positive for malaise/fatigue and weight loss.       Negative hot/cold intolerance  Cardiovascular: Negative for palpitations.  Gastrointestinal: Negative for nausea and vomiting.  Musculoskeletal:       Negative muscle weakness  Endo/Heme/Allergies:       Positive polyphagia    PHYSICAL EXAM: Blood pressure 128/79, pulse 95, temperature (!) 97.5 F (36.4 C), temperature source Oral, height 5\' 5"  (1.651 m), weight 214  lb (97.1 kg), SpO2 98 %. Body mass index is 35.61 kg/m. Physical Exam  Constitutional: She is oriented to person, place, and time. She appears well-developed and well-nourished.  Cardiovascular: Normal rate.  Pulmonary/Chest: Effort normal.  Musculoskeletal: Normal range of motion.  Neurological: She is oriented to person, place, and time.  Skin: Skin is warm and dry.  Psychiatric: She has a normal mood and affect. Her behavior is normal.  Vitals reviewed.   RECENT LABS AND TESTS: BMET    Component Value Date/Time   NA 141 11/02/2017 1109   K 4.4 11/02/2017 1109   CL 101 11/02/2017 1109   CO2 21 11/02/2017 1109   GLUCOSE 85 11/02/2017 1109   GLUCOSE 71 09/03/2017 1446   BUN 10 11/02/2017 1109   CREATININE 0.97 11/02/2017 1109   CALCIUM 9.8 11/02/2017 1109   GFRNONAA 59 (L) 11/02/2017 1109   GFRAA 68 11/02/2017 1109   Lab Results  Component Value Date   HGBA1C 5.4 11/02/2017   Lab Results  Component Value Date   INSULIN 12.4 11/02/2017   CBC    Component Value Date/Time   WBC 6.0 11/02/2017 1109   WBC 6.1 09/03/2017 1446   RBC 4.18 11/02/2017 1109   RBC 4.34 09/03/2017 1446   HGB 10.4 (L) 11/02/2017 1109   HCT 33.8 (L) 11/02/2017 1109   PLT 340.0 09/03/2017 1446   PLT 272 04/25/2015 1533   MCV 81 11/02/2017 1109   MCH 24.9 (L) 11/02/2017 1109   MCH 29.5  11/07/2015 1324   MCHC 30.8 (L) 11/02/2017 1109   MCHC 31.6 09/03/2017 1446   RDW 15.7 (H) 11/02/2017 1109   LYMPHSABS 1.4 11/02/2017 1109   MONOABS 0.5 12/13/2014 1621   EOSABS 0.2 11/02/2017 1109   BASOSABS 0.1 11/02/2017 1109   Iron/TIBC/Ferritin/ %Sat    Component Value Date/Time   FERRITIN 6.1 (L) 09/03/2017 1446   FERRITIN 36 05/24/2015 1535   Lipid Panel     Component Value Date/Time   CHOL 229 (H) 11/02/2017 1109   TRIG 182 (H) 11/02/2017 1109   HDL 57 11/02/2017 1109   CHOLHDL 4 09/03/2017 1446   VLDL 36.0 09/03/2017 1446   LDLCALC 136 (H) 11/02/2017 1109   Hepatic Function Panel     Component Value Date/Time   PROT 7.3 11/02/2017 1109   ALBUMIN 4.4 11/02/2017 1109   AST 26 11/02/2017 1109   ALT 17 11/02/2017 1109   ALKPHOS 117 11/02/2017 1109   BILITOT 0.2 11/02/2017 1109      Component Value Date/Time   TSH 5.880 (H) 11/02/2017 1109   TSH 2.31 09/03/2017 1446   TSH 1.29 09/01/2016 1528  Results for ALDENA, WORM (MRN [id number]) as of 11/18/2017 09:56  Ref. Range 11/02/2017 11:09  Vitamin D, 25-Hydroxy Latest Ref Range: 30.0 - 100.0 ng/mL 25.3 (L)    ASSESSMENT AND PLAN: Vitamin D deficiency  B12 nutritional deficiency  Other iron deficiency anemia  Other specified hypothyroidism  Insulin resistance  Class 2 severe obesity with serious comorbidity and body mass index (BMI) of 35.0 to 35.9 in adult, unspecified obesity type ([place])  PLAN:  Vitamin D Deficiency [name] Nolan was informed that low vitamin D levels contributes to fatigue and are associated with obesity, breast, and colon cancer. [name] Nolan agrees to start prescription Vit D @50 ,000 IU every week #4 with no refills. She will follow up for routine testing of vitamin D, at least 2-3 times per year. She was informed of the risk of over-replacement of vitamin D and agrees  to not increase her dose unless she discusses this with [place] first. We will recheck labs in 3 months and [name] Nolan agrees to  follow up with our clinic in 2 weeks.  Vitamin B12 Deficiency [name] Nolan will work on increasing B12 rich foods in her diet. She will continue B12 OTC and she continue on B12 rich diet. We will recheck labs in 3 months and [name] Nolan agrees to follow up with our clinic in 2 weeks.   Iron Deficiency Anemia The diagnosis of Iron deficiency anemia was discussed with Nichol and was explained in detail. She was given suggestions of iron rich foods. [name] Nolan will continue iron rich diet and she is encouraged to start cooking with and iron skillet to help increase iron consumption. We will recheck labs in 3 months and [name] Nolan agrees to follow up with our clinic in 2 weeks.   Hypothyroid [name] Nolan was informed of the importance of good thyroid control to help with weight loss efforts. She was also informed that supertheraputic thyroid levels are dangerous and will not improve weight loss results. [name] Nolan agrees to increase Synthroid to 75 mg daily but take 1/2 pill extra 1 time daily per week. We will recheck labs in 6 weeks and [name] Nolan agrees to follow up with our clinic in 2 weeks.  Insulin Resistance [name] Nolan will continue to work on weight loss, diet, exercise, and decreasing simple carbohydrates in her diet to help decrease the risk of diabetes. We dicussed metformin including benefits and risks. She was informed that eating too many simple carbohydrates or too many calories at one sitting increases the likelihood of GI side effects. [name] Nolan declined metformin for now and prescription was not written today. Keilany agreed to follow up with our clinic in 2 weeks as directed to monitor her progress.  Obesity Neva is currently in the action stage of change. As such, her goal is to continue with weight loss efforts She has agreed to follow the Category 2 plan Berkley has been instructed to work up to a goal of 150 minutes of combined cardio and strengthening exercise per week for weight loss and overall health benefits. We  discussed the following Behavioral Modification Strategies today: increasing lean protein intake, decreasing simple carbohydrates, work on meal planning and easy cooking plans, no skipping meals, and emotional eating strategies   Lamees has agreed to follow up with our clinic in 2 weeks. She was informed of the importance of frequent follow up visits to maximize her success with intensive lifestyle modifications for her multiple health conditions.   OBESITY BEHAVIORAL INTERVENTION VISIT  Today's visit was # 2 out of 22.  Starting weight: 215 lbs Starting date: 11/02/17 Today's weight : 214 lbs  Today's date: 11/17/2017 Total lbs lost to date: 1 (Patients must lose 7 lbs in the first 6 months to continue with counseling)   ASK: We discussed the diagnosis of obesity with Ardis Stovall-Edwards today and Amarii agreed to give [place] permission to discuss obesity behavioral modification therapy today.  ASSESS: Milea has the diagnosis of obesity and her BMI today is 35.61 Georgiann is in the action stage of change   ADVISE: Carynn was educated on the multiple health risks of obesity as well as the benefit of weight loss to improve her health. She was advised of the need for long term treatment and the importance of lifestyle modifications.  AGREE: Multiple dietary modification options and treatment options were discussed and  Ivelisse agreed to the above obesity treatment plan.  Clide Dales  Hassell Done, am acting as transcriptionist for Dennard Nip, MD  I have reviewed the above documentation for accuracy and completeness, and I agree with the above. -Dennard Nip, MD

## 2017-12-01 ENCOUNTER — Ambulatory Visit (INDEPENDENT_AMBULATORY_CARE_PROVIDER_SITE_OTHER): Payer: Medicare Other | Admitting: Family Medicine

## 2017-12-01 VITALS — BP 124/80 | HR 86 | Temp 98.3°F | Ht 65.0 in | Wt 213.0 lb

## 2017-12-01 DIAGNOSIS — E8881 Metabolic syndrome: Secondary | ICD-10-CM | POA: Diagnosis not present

## 2017-12-01 DIAGNOSIS — Z6835 Body mass index (BMI) 35.0-35.9, adult: Secondary | ICD-10-CM

## 2017-12-02 NOTE — Progress Notes (Signed)
Office: [phone]  /  Fax: [phone]   HPI:   Chief Complaint: OBESITY [name] Nolan is here to discuss her progress with her obesity treatment plan. She is on the Category 2 plan and is following her eating plan approximately 95 % of the time. She states she is walking for 60 minutes 7 times per week. [name] continues to do well with weight loss but is frustrated that her weight loss isn't faster. She walks her dogs 1 time daily for 30 minutes.  Her weight is 213 lb (96.6 kg) today and has had a weight loss of 1 pound over a period of 2 weeks since her last visit. She has lost 2 lbs since starting treatment with [place].  Insulin Resistance [name] Nolan was diagnosed with insulin resistance and has more questions about what this is and what she can do about it. Although [name] Nolan's blood glucose readings are still under good control, insulin resistance puts her at greater risk of metabolic syndrome and diabetes. She is not taking metformin currently and continues to work on diet and exercise to decrease risk of diabetes.  ALLERGIES: Allergies  Allergen Reactions  . Statins Other (See Comments)    Muscle pain  . Codeine Nausea And Vomiting    MEDICATIONS: Current Outpatient Medications on File Prior to Visit  Medication Sig Dispense Refill  . BOTOX 100 units SOLR injection INJECT 155 UNITS INTO FACIAL AND NECK MUSCLES EVERY 3 MONTHS (GIVEN AT MD OFFICE, DISCARD UNUSED PORTION AFTER FIRST USE) 2 vial 3  . cyclobenzaprine (FLEXERIL) 10 MG tablet TAKE 1 TABLET BY MOUTH AT BEDTIME. CAN ALSO TAKE 1/2 -1 TABLET TWICE DAILY FOR HEADACHE 90 tablet 11  . levothyroxine (SYNTHROID) 88 MCG tablet Take 1 tablet by mouth daily.    . rizatriptan (MAXALT-MLT) 10 MG disintegrating tablet Take 1 tablet (10 mg total) by mouth as needed for migraine. May repeat in 2 hours if needed 15 tablet 11  . Venlafaxine HCl 150 MG TB24 Take 1 tablet (150 mg total) by mouth daily. 30 each 5  . Vitamin D, Ergocalciferol, (DRISDOL)  50000 units CAPS capsule Take 1 capsule (50,000 Units total) by mouth every 7 (seven) days. 4 capsule 0   No current facility-administered medications on file prior to visit.     PAST MEDICAL HISTORY: Past Medical History:  Diagnosis Date  . Acute bronchitis 08/15/2015  . Anxiety   . Anxiety state 05/07/2014   Widowed in 2013 after caring for her husband with Lewy Body Dementia for 6 years   . Arrhythmia 01/25/2015   Per Dr. Jaynee Eagles, [facility]; hx PVC  . Arthritis    "knees" (11/07/2015)  . 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 Basal cell carcinoma of right ear 02/26/2015   Removed by Dr Syble Creek  . Chronic back pain    "mid-back; stops at the very lowest part of my back" (11/07/2015)  . Colon polyp 09/03/2017  . Constipation 02/25/2016  . Depression   . Dysphagia   . Dyspnea   . Esophageal reflux    occ  . Fainting    fainted twice  . Family history of adverse reaction to anesthesia    "daughter gets bad PONV"  . Gallbladder problem   . Hashimoto's disease   . Heart murmur   . Hot flashes 05/27/2016  . Hyperlipidemia   . Hypothyroid   . Insomnia   . Joint pain   . Low ferritin 08/27/2015   "took supplements for awhile" (11/07/2015)  . Medicare  annual wellness visit, subsequent 08/27/2015  . Menopause   . Migraine    "under control w/daily RX right now" (11/07/2015)  . Occipital neuralgia   . Osteopenia 02/26/2015  . Osteoporosis    osteopenia  . Preventative health care 08/27/2015  . Prolonged depressive reaction   . Swallowing difficulty   . Vitamin B12 deficiency 09/01/2016  . Vitamin D deficiency 03/02/2017    PAST SURGICAL HISTORY: Past Surgical History:  Procedure Laterality Date  . APPENDECTOMY  11/07/2015  . BASAL CELL CARCINOMA EXCISION Right 02/26/2015   ear  . CHOLECYSTECTOMY OPEN  1974  . COLONOSCOPY  2006  . LAPAROSCOPIC APPENDECTOMY N/A 11/07/2015   Procedure: APPENDECTOMY LAPAROSCOPIC;  Surgeon: Georganna Skeans, MD;  Location: [facility];   Service: General;  Laterality: N/A;  . LAPAROSCOPIC INCISIONAL / UMBILICAL / [place]  11/07/2015   UHR  . Tooth implant     at least 5 years ago per pt  . TUBAL LIGATION  1973  . UMBILICAL HERNIA REPAIR N/A 11/07/2015   Procedure: LAPAROSCOPIC UMBILICAL HERNIA;  Surgeon: Georganna Skeans, MD;  Location: [facility];  Service: General;  Laterality: N/A;    SOCIAL HISTORY: Social History   Tobacco Use  . Smoking status: Never Smoker  . Smokeless tobacco: Never Used  Substance Use Topics  . Alcohol use: No    Alcohol/week: 0.0 oz  . Drug use: No    FAMILY HISTORY: Family History  Problem Relation Age of Onset  . Congestive Heart Failure Mother   . Hypertension Mother   . Hyperlipidemia Mother   . Heart disease Mother   . Obesity Mother   . Leukemia Father   . Obesity Father   . Kidney disease Brother   . Cancer Brother        stage 4 kidney cancer, metastatic  . Kidney cancer Brother   . Leukemia Maternal Aunt   . Congestive Heart Failure Maternal Grandmother   . Arthritis Sister   . Colon cancer Neg Hx     ROS: Review of Systems  Constitutional: Positive for weight loss.    PHYSICAL EXAM: Blood pressure 124/80, pulse 86, temperature 98.3 F (36.8 C), temperature source Oral, height 5\' 5"  (1.651 m), weight 213 lb (96.6 kg), SpO2 98 %. Body mass index is 35.45 kg/m. Physical Exam  Constitutional: She is oriented to person, place, and time. She appears well-developed and well-nourished.  Cardiovascular: Normal rate.  Pulmonary/Chest: Effort normal.  Musculoskeletal: Normal range of motion.  Neurological: She is oriented to person, place, and time.  Skin: Skin is warm and dry.  Psychiatric: She has a normal mood and affect. Her behavior is normal.  Vitals reviewed.   RECENT LABS AND TESTS: BMET    Component Value Date/Time   NA 141 11/02/2017 1109   K 4.4 11/02/2017 1109   CL 101 11/02/2017 1109   CO2 21 11/02/2017 1109   GLUCOSE 85 11/02/2017 1109    GLUCOSE 71 09/03/2017 1446   BUN 10 11/02/2017 1109   CREATININE 0.97 11/02/2017 1109   CALCIUM 9.8 11/02/2017 1109   GFRNONAA 59 (L) 11/02/2017 1109   GFRAA 68 11/02/2017 1109   Lab Results  Component Value Date   HGBA1C 5.4 11/02/2017   Lab Results  Component Value Date   INSULIN 12.4 11/02/2017   CBC    Component Value Date/Time   WBC 6.0 11/02/2017 1109   WBC 6.1 09/03/2017 1446   RBC 4.18 11/02/2017 1109   RBC 4.34 09/03/2017 1446  HGB 10.4 (L) 11/02/2017 1109   HCT 33.8 (L) 11/02/2017 1109   PLT 340.0 09/03/2017 1446   PLT 272 04/25/2015 1533   MCV 81 11/02/2017 1109   MCH 24.9 (L) 11/02/2017 1109   MCH 29.5 11/07/2015 1324   MCHC 30.8 (L) 11/02/2017 1109   MCHC 31.6 09/03/2017 1446   RDW 15.7 (H) 11/02/2017 1109   LYMPHSABS 1.4 11/02/2017 1109   MONOABS 0.5 12/13/2014 1621   EOSABS 0.2 11/02/2017 1109   BASOSABS 0.1 11/02/2017 1109   Iron/TIBC/Ferritin/ %Sat    Component Value Date/Time   FERRITIN 6.1 (L) 09/03/2017 1446   FERRITIN 36 05/24/2015 1535   Lipid Panel     Component Value Date/Time   CHOL 229 (H) 11/02/2017 1109   TRIG 182 (H) 11/02/2017 1109   HDL 57 11/02/2017 1109   CHOLHDL 4 09/03/2017 1446   VLDL 36.0 09/03/2017 1446   LDLCALC 136 (H) 11/02/2017 1109   Hepatic Function Panel     Component Value Date/Time   PROT 7.3 11/02/2017 1109   ALBUMIN 4.4 11/02/2017 1109   AST 26 11/02/2017 1109   ALT 17 11/02/2017 1109   ALKPHOS 117 11/02/2017 1109   BILITOT 0.2 11/02/2017 1109      Component Value Date/Time   TSH 5.880 (H) 11/02/2017 1109   TSH 2.31 09/03/2017 1446   TSH 1.29 09/01/2016 1528    ASSESSMENT AND PLAN: Insulin resistance  Class 2 severe obesity with serious comorbidity and body mass index (BMI) of 35.0 to 35.9 in adult, unspecified obesity type ([facility])  PLAN:  Insulin Resistance Domitila was educated approximately 20 minutes about insulin resistance and risk of developing diabetes mellitus, diet, exercise, and  weight loss were discussed in depth. She will continue to work on weight loss, exercise, and decreasing simple carbohydrates in her diet to help decrease the risk of diabetes. We dicussed metformin including benefits and risks. She was informed that eating too many simple carbohydrates or too many calories at one sitting increases the likelihood of GI side effects. Velta declined metformin for now and prescription was not written today. Ameerah agrees to follow up with our clinic in 3 weeks as directed to monitor her progress.  We spent > than 50% of the 30 minute visit on the counseling as documented in the note.  Obesity Destinie is currently in the action stage of change. As such, her goal is to continue with weight loss efforts She has agreed to follow the Category 2 plan Cambree has been instructed to work up to a goal of 150 minutes of combined cardio and strengthening exercise per week for weight loss and overall health benefits. We discussed the following Behavioral Modification Strategies today: increasing lean protein intake, decreasing simple carbohydrates  and work on meal planning and easy cooking plans Mariann was educated on the importance of slow and steady weight loss and congratulated her on continuing to lose weight.  Tinya has agreed to follow up with our clinic in 3 weeks. She was informed of the importance of frequent follow up visits to maximize her success with intensive lifestyle modifications for her multiple health conditions.   OBESITY BEHAVIORAL INTERVENTION VISIT  Today's visit was # 3 out of 22.  Starting weight: 215 lbs Starting date: 11/02/17 Today's weight : 213 lbs  Today's date: 12/01/2017 Total lbs lost to date: 2 (Patients must lose 7 lbs in the first 6 months to continue with counseling)   ASK: We discussed the diagnosis of obesity with [facility]ov[facility] today and Anahi agreed to give [place] permission to discuss obesity behavioral modification therapy  today.  ASSESS: Nuriya has the diagnosis of obesity and her BMI today is 35.44 Violet is in the action stage of change   ADVISE: Lindsea was educated on the multiple health risks of obesity as well as the benefit of weight loss to improve her health. She was advised of the need for long term treatment and the importance of lifestyle modifications.  AGREE: Multiple dietary modification options and treatment options were discussed and  Fynlee agreed to the above obesity treatment plan.  I, Trixie Dredge, am acting as transcriptionist for Dennard Nip, MD  I have reviewed the above documentation for accuracy and completeness, and I agree with the above. -Dennard Nip, MD

## 2017-12-04 ENCOUNTER — Telehealth: Payer: Self-pay | Admitting: Neurology

## 2017-12-04 NOTE — Telephone Encounter (Signed)
Veda/Briova [phone] called regarding PA for botox. She said to call [phone]

## 2017-12-07 NOTE — Telephone Encounter (Signed)
I called and obtained PA.   [id number] (03/09/18)

## 2017-12-08 ENCOUNTER — Telehealth: Payer: Self-pay | Admitting: Neurology

## 2017-12-08 NOTE — Telephone Encounter (Signed)
Patients Botox authorization [id number](03/09/28) medication is received through [place].

## 2017-12-09 NOTE — Telephone Encounter (Signed)
I called and spoke with the patient regarding her botox rx. I told her the pharmacy is awaiting consent for the medication, she stated that she would call and give consent.

## 2017-12-12 ENCOUNTER — Other Ambulatory Visit (INDEPENDENT_AMBULATORY_CARE_PROVIDER_SITE_OTHER): Payer: Self-pay | Admitting: Family Medicine

## 2017-12-15 ENCOUNTER — Other Ambulatory Visit (HOSPITAL_BASED_OUTPATIENT_CLINIC_OR_DEPARTMENT_OTHER): Payer: Medicare Other

## 2017-12-15 ENCOUNTER — Ambulatory Visit (HOSPITAL_BASED_OUTPATIENT_CLINIC_OR_DEPARTMENT_OTHER): Payer: Medicare Other

## 2017-12-16 ENCOUNTER — Ambulatory Visit (INDEPENDENT_AMBULATORY_CARE_PROVIDER_SITE_OTHER): Payer: Medicare Other | Admitting: Neurology

## 2017-12-16 ENCOUNTER — Encounter: Payer: Self-pay | Admitting: Neurology

## 2017-12-16 VITALS — BP 118/78 | Ht 65.0 in | Wt 217.8 lb

## 2017-12-16 DIAGNOSIS — G43711 Chronic migraine without aura, intractable, with status migrainosus: Secondary | ICD-10-CM

## 2017-12-16 NOTE — Progress Notes (Signed)
 Consent Form Botulism Toxin Injection For Chronic Migraine  Interval history: Tremendous improvement, >> 50% decrease in frequency and severity  Reviewed orally with patient, additionally signature is on file:  Botulism toxin has been approved by the Federal drug administration for treatment of chronic migraine. Botulism toxin does not cure chronic migraine and it may not be effective in some patients.  The administration of botulism toxin is accomplished by injecting a small amount of toxin into the muscles of the neck and head. Dosage must be titrated for each individual. Any benefits resulting from botulism toxin tend to wear off after 3 months with a repeat injection required if benefit is to be maintained. Injections are usually done every 3-4 months with maximum effect peak achieved by about 2 or 3 weeks. Botulism toxin is expensive and you should be sure of what costs you will incur resulting from the injection.  The side effects of botulism toxin use for chronic migraine may include:   -Transient, and usually mild, facial weakness with facial injections  -Transient, and usually mild, head or neck weakness with head/neck injections  -Reduction or loss of forehead facial animation due to forehead muscle weakness  -Eyelid drooping  -Dry eye  -Pain at the site of injection or bruising at the site of injection  -Double vision  -Potential unknown long term risks  Contraindications: You should not have Botox if you are pregnant, nursing, allergic to albumin, have an infection, skin condition, or muscle weakness at the site of the injection, or have myasthenia gravis, Lambert-Eaton syndrome, or ALS.  It is also possible that as with any injection, there may be an allergic reaction or no effect from the medication. Reduced effectiveness after repeated injections is sometimes seen and rarely infection at the injection site may occur. All care will be taken to prevent these side effects. If  therapy is given over a long time, atrophy and wasting in the muscle injected may occur. Occasionally the patient's become refractory to treatment because they develop antibodies to the toxin. In this event, therapy needs to be modified.  I have read the above information and consent to the administration of botulism toxin.    BOTOX PROCEDURE NOTE FOR MIGRAINE HEADACHE    Contraindications and precautions discussed with patient(above). Aseptic procedure was observed and patient tolerated procedure. Procedure performed by Dr. Toni Ahern  The condition has existed for more than 6 months, and pt does not have a diagnosis of ALS, Myasthenia Gravis or Lambert-Eaton Syndrome.  Risks and benefits of injections discussed and pt agrees to proceed with the procedure.  Written consent obtained  These injections are medically necessary. Pt  receives good benefits from these injections. These injections do not cause sedations or hallucinations which the oral therapies may cause.  Indication/Diagnosis: chronic migraine BOTOX(J0585) injection was performed according to protocol by [employer]. 200 units of BOTOX was dissolved into 4 cc NS.   NDC: [address]-01   Description of procedure:  The patient was placed in a sitting position. The standard protocol was used for Botox as follows, with 5 units of Botox injected at each site:   -Procerus muscle, midline injection  -Corrugator muscle, bilateral injection  -Frontalis muscle, bilateral injection, with 2 sites each side, medial injection was performed in the upper one third of the frontalis muscle, in the region vertical from the medial inferior edge of the superior orbital rim. The lateral injection was again in the upper one third of the forehead vertically above the lateral   limbus of the cornea, 1.5 cm lateral to the medial injection site.  -Temporalis muscle injection, 4 sites, bilaterally. The first injection was 3 cm above the tragus of the ear,  second injection site was 1.5 cm to 3 cm up from the first injection site in line with the tragus of the ear. The third injection site was 1.5-3 cm forward between the first 2 injection sites. The fourth injection site was 1.5 cm posterior to the second injection site.  -Occipitalis muscle injection, 3 sites, bilaterally. The first injection was done one half way between the occipital protuberance and the tip of the mastoid process behind the ear. The second injection site was done lateral and superior to the first, 1 fingerbreadth from the first injection. The third injection site was 1 fingerbreadth superiorly and medially from the first injection site.  -Cervical paraspinal muscle injection, 2 sites, bilateral knee first injection site was 1 cm from the midline of the cervical spine, 3 cm inferior to the lower border of the occipital protuberance. The second injection site was 1.5 cm superiorly and laterally to the first injection site.  -Trapezius muscle injection was performed at 3 sites, bilaterally. The first injection site was in the upper trapezius muscle halfway between the inflection point of the neck, and the acromion. The second injection site was one half way between the acromion and the first injection site. The third injection was done between the first injection site and the inflection point of the neck.   Will return for repeat injection in 3 months.   A 200 unit sof Botox was used, 155 units were injected, the rest of the Botox was wasted. The patient tolerated the procedure well, there were no complications of the above procedure.   

## 2017-12-16 NOTE — Progress Notes (Signed)
Botox- 100 units x 2 vials Lot: [id number] Expiration: 05/2020 NDC: 4540-9811-91  Bacteriostatic 0.9% Sodium Chloride- 66mL total Lot: [id number] Expiration: 01/14/2019 NDC: 6213-0865-78  Dx: I69.629 S/P

## 2017-12-23 ENCOUNTER — Ambulatory Visit (INDEPENDENT_AMBULATORY_CARE_PROVIDER_SITE_OTHER): Payer: Medicare Other | Admitting: Family Medicine

## 2017-12-23 ENCOUNTER — Encounter (INDEPENDENT_AMBULATORY_CARE_PROVIDER_SITE_OTHER): Payer: Self-pay

## 2018-01-04 ENCOUNTER — Ambulatory Visit (INDEPENDENT_AMBULATORY_CARE_PROVIDER_SITE_OTHER): Payer: Medicare Other | Admitting: Family Medicine

## 2018-01-04 VITALS — BP 130/86 | HR 80 | Temp 99.3°F | Ht 65.0 in | Wt 211.0 lb

## 2018-01-04 DIAGNOSIS — Z6835 Body mass index (BMI) 35.0-35.9, adult: Secondary | ICD-10-CM

## 2018-01-04 DIAGNOSIS — E559 Vitamin D deficiency, unspecified: Secondary | ICD-10-CM

## 2018-01-04 DIAGNOSIS — F3289 Other specified depressive episodes: Secondary | ICD-10-CM | POA: Diagnosis not present

## 2018-01-04 MED ORDER — BUPROPION HCL ER (SR) 150 MG PO TB12: 150.0000 mg | ORAL_TABLET | Freq: Every day | ORAL | 0 refills | Status: DC

## 2018-01-04 MED ORDER — VITAMIN D (ERGOCALCIFEROL) 1.25 MG (50000 UNIT) PO CAPS: 50000.0000 [IU] | ORAL_CAPSULE | ORAL | 0 refills | Status: DC

## 2018-01-04 NOTE — Progress Notes (Signed)
Office: [phone]  /  Fax: [phone]   HPI:   Chief Complaint: OBESITY [name] Nolan is here to discuss her progress with her obesity treatment plan. She is on the Category 2 plan and is following her eating plan approximately 50 % of the time. She states she is walking for 30-60 minutes 7 times per week. [name] Nolan continues to do well with weight loss but is frustrated it isn't going faster. She has had increased stress with husband's chronic illness and newly recent falls. She is feeling overwhelmed.  Her weight is 211 lb (95.7 kg) today and has had a weight loss of 2 pounds over a period of 4 weeks since her last visit. She has lost 4 lbs since starting treatment with [place].  Vitamin D Deficiency [name] Nolan has a diagnosis of vitamin D deficiency. She is stable on prescription Vit D, not yet at goal. She denies nausea, vomiting or muscle weakness.  Depression with emotional eating behaviors [name] Nolan notes decreased mood and increase in frustration while caring for her sick husband. She has been stable on Effexor but notes using food for comfort more recently. [name] Nolan struggles with emotional eating and using food for comfort to the extent that it is negatively impacting her health. She often snacks when she is not hungry. [name] Nolan sometimes feels she is out of control and then feels guilty that she made poor food choices. She has been working on behavior modification techniques to help reduce her emotional eating and has been somewhat successful. She shows no sign of suicidal or homicidal ideations.  Depression screen [facility] 2/9 11/02/2017 09/03/2017 09/01/2016 08/27/2015 05/29/2015  Decreased Interest 3 0 1 0 0  Down, Depressed, Hopeless 3 0 1 1 0  PHQ - 2 Score 6 0 2 1 0  Altered sleeping 3 - 1 - -  Tired, decreased energy 3 - 0 - -  Change in appetite 3 - 3 - -  Feeling bad or failure about yourself  1 - 0 - -  Trouble concentrating 1 - 0 - -  Moving slowly or fidgety/restless 0 - 0 - -  Suicidal  thoughts 0 - 0 - -  PHQ-9 Score 17 - 6 - -  Difficult doing work/chores Somewhat difficult - - - -   ALLERGIES: Allergies  Allergen Reactions  . Statins Other (See Comments)    Muscle pain  . Codeine Nausea And Vomiting    MEDICATIONS: Current Outpatient Medications on File Prior to Visit  Medication Sig Dispense Refill  . BOTOX 100 units SOLR injection INJECT 155 UNITS INTO FACIAL AND NECK MUSCLES EVERY 3 MONTHS (GIVEN AT MD OFFICE, DISCARD UNUSED PORTION AFTER FIRST USE) 2 vial 3  . cyclobenzaprine (FLEXERIL) 10 MG tablet TAKE 1 TABLET BY MOUTH AT BEDTIME. CAN ALSO TAKE 1/2 -1 TABLET TWICE DAILY FOR HEADACHE 90 tablet 11  . levothyroxine (SYNTHROID) 88 MCG tablet Take 1 tablet by mouth daily.    . rizatriptan (MAXALT-MLT) 10 MG disintegrating tablet Take 1 tablet (10 mg total) by mouth as needed for migraine. May repeat in 2 hours if needed 15 tablet 11  . Venlafaxine HCl 150 MG TB24 Take 1 tablet (150 mg total) by mouth daily. 30 each 5   No current facility-administered medications on file prior to visit.     PAST MEDICAL HISTORY: Past Medical History:  Diagnosis Date  . Acute bronchitis 08/15/2015  . Anxiety   . Anxiety state 05/07/2014   Widowed in 2013 after caring for her husband with  Lewy Body Dementia for 6 years   . Arrhythmia 01/25/2015   Per Dr. Jaynee Eagles, [facility]; hx PVC  . Arthritis    "knees" (11/07/2015)  . 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 Basal cell carcinoma of right ear 02/26/2015   Removed by Dr [place]  . Chronic back pain    "mid-back; stops at the very lowest part of my back" (11/07/2015)  . Colon polyp 09/03/2017  . Constipation 02/25/2016  . Depression   . Dysphagia   . Dyspnea   . Esophageal reflux    occ  . Fainting    fainted twice  . Family history of adverse reaction to anesthesia    "daughter gets bad PONV"  . Gallbladder problem   . Hashimoto's disease   . Heart murmur   . Hot flashes 05/27/2016  . Hyperlipidemia     . Hypothyroid   . Insomnia   . Joint pain   . Low ferritin 08/27/2015   "took supplements for awhile" (11/07/2015)  . Medicare annual wellness visit, subsequent 08/27/2015  . Menopause   . Migraine    "under control w/daily RX right now" (11/07/2015)  . Occipital neuralgia   . Osteopenia 02/26/2015  . Osteoporosis    osteopenia  . Preventative health care 08/27/2015  . Prolonged depressive reaction   . Swallowing difficulty   . Vitamin B12 deficiency 09/01/2016  . Vitamin D deficiency 03/02/2017    PAST SURGICAL HISTORY: Past Surgical History:  Procedure Laterality Date  . APPENDECTOMY  11/07/2015  . BASAL CELL CARCINOMA EXCISION Right 02/26/2015   ear  . CHOLECYSTECTOMY OPEN  1974  . COLONOSCOPY  2006  . LAPAROSCOPIC APPENDECTOMY N/A 11/07/2015   Procedure: APPENDECTOMY LAPAROSCOPIC;  Surgeon: Georganna Skeans, MD;  Location: [facility];  Service: General;  Laterality: N/A;  . LAPAROSCOPIC INCISIONAL / UMBILICAL / [place]  11/07/2015   UHR  . Tooth implant     at least 5 years ago per pt  . TUBAL LIGATION  1973  . UMBILICAL HERNIA REPAIR N/A 11/07/2015   Procedure: LAPAROSCOPIC UMBILICAL HERNIA;  Surgeon: Georganna Skeans, MD;  Location: [facility];  Service: General;  Laterality: N/A;    SOCIAL HISTORY: Social History   Tobacco Use  . Smoking status: Never Smoker  . Smokeless tobacco: Never Used  Substance Use Topics  . Alcohol use: No    Alcohol/week: 0.0 oz  . Drug use: No    FAMILY HISTORY: Family History  Problem Relation Age of Onset  . Congestive Heart Failure Mother   . Hypertension Mother   . Hyperlipidemia Mother   . Heart disease Mother   . Obesity Mother   . Leukemia Father   . Obesity Father   . Kidney disease Brother   . Cancer Brother        stage 4 kidney cancer, metastatic  . Kidney cancer Brother   . Leukemia Maternal Aunt   . Congestive Heart Failure Maternal Grandmother   . Arthritis Sister   . Colon cancer Neg Hx     ROS: Review  of Systems  Constitutional: Positive for weight loss.  Gastrointestinal: Negative for nausea and vomiting.  Musculoskeletal:       Negative muscle weakness  Psychiatric/Behavioral: Positive for depression. Negative for suicidal ideas.    PHYSICAL EXAM: Blood pressure 130/86, pulse 80, temperature 99.3 F (37.4 C), temperature source Oral, height 5\' 5"  (1.651 m), weight 211 lb (95.7 kg), SpO2 98 %. Body mass index is 35.11 kg/m. Physical  Exam  Constitutional: She is oriented to person, place, and time. She appears well-developed and well-nourished.  Cardiovascular: Normal rate.  Pulmonary/Chest: Effort normal.  Musculoskeletal: Normal range of motion.  Neurological: She is oriented to person, place, and time.  Skin: Skin is warm and dry.  Psychiatric: She has a normal mood and affect. Her behavior is normal.  Vitals reviewed.   RECENT LABS AND TESTS: BMET    Component Value Date/Time   NA 141 11/02/2017 1109   K 4.4 11/02/2017 1109   CL 101 11/02/2017 1109   CO2 21 11/02/2017 1109   GLUCOSE 85 11/02/2017 1109   GLUCOSE 71 09/03/2017 1446   BUN 10 11/02/2017 1109   CREATININE 0.97 11/02/2017 1109   CALCIUM 9.8 11/02/2017 1109   GFRNONAA 59 (L) 11/02/2017 1109   GFRAA 68 11/02/2017 1109   Lab Results  Component Value Date   HGBA1C 5.4 11/02/2017   Lab Results  Component Value Date   INSULIN 12.4 11/02/2017   CBC    Component Value Date/Time   WBC 6.0 11/02/2017 1109   WBC 6.1 09/03/2017 1446   RBC 4.18 11/02/2017 1109   RBC 4.34 09/03/2017 1446   HGB 10.4 (L) 11/02/2017 1109   HCT 33.8 (L) 11/02/2017 1109   PLT 340.0 09/03/2017 1446   PLT 272 04/25/2015 1533   MCV 81 11/02/2017 1109   MCH 24.9 (L) 11/02/2017 1109   MCH 29.5 11/07/2015 1324   MCHC 30.8 (L) 11/02/2017 1109   MCHC 31.6 09/03/2017 1446   RDW 15.7 (H) 11/02/2017 1109   LYMPHSABS 1.4 11/02/2017 1109   MONOABS 0.5 12/13/2014 1621   EOSABS 0.2 11/02/2017 1109   BASOSABS 0.1 11/02/2017 1109    Iron/TIBC/Ferritin/ %Sat    Component Value Date/Time   FERRITIN 6.1 (L) 09/03/2017 1446   FERRITIN 36 05/24/2015 1535   Lipid Panel     Component Value Date/Time   CHOL 229 (H) 11/02/2017 1109   TRIG 182 (H) 11/02/2017 1109   HDL 57 11/02/2017 1109   CHOLHDL 4 09/03/2017 1446   VLDL 36.0 09/03/2017 1446   LDLCALC 136 (H) 11/02/2017 1109   Hepatic Function Panel     Component Value Date/Time   PROT 7.3 11/02/2017 1109   ALBUMIN 4.4 11/02/2017 1109   AST 26 11/02/2017 1109   ALT 17 11/02/2017 1109   ALKPHOS 117 11/02/2017 1109   BILITOT 0.2 11/02/2017 1109      Component Value Date/Time   TSH 5.880 (H) 11/02/2017 1109   TSH 2.31 09/03/2017 1446   TSH 1.29 09/01/2016 1528  Results for SUDIE, BANDEL (MRN [id number]) as of 01/04/2018 17:20  Ref. Range 11/02/2017 11:09  Vitamin D, 25-Hydroxy Latest Ref Range: 30.0 - 100.0 ng/mL 25.3 (L)    ASSESSMENT AND PLAN: Vitamin D deficiency - Plan: Vitamin D, Ergocalciferol, (DRISDOL) 50000 units CAPS capsule  Other depression - With emotional eating  - Plan: buPROPion (WELLBUTRIN SR) 150 MG 12 hr tablet  Class 2 severe obesity with serious comorbidity and body mass index (BMI) of 35.0 to 35.9 in adult, unspecified obesity type (HCC)  PLAN:  Vitamin D Deficiency Westyn was informed that low vitamin D levels contributes to fatigue and are associated with obesity, breast, and colon cancer. Montzerrat agrees to continue taking prescription Vit D @50 ,000 IU every week #4 and we will refill for 1 month. She will follow up for routine testing of vitamin D, at least 2-3 times per year. She was informed of the risk of over-replacement of  vitamin D and agrees to not increase her dose unless she discusses this with [place] first. Riyanshi agrees to follow up with our clinic in 2 to 3 weeks.  Depression with Emotional Eating Behaviors We discussed behavior modification techniques today to help [name] Nolan deal with her emotional eating and  depression. [name] Nolan agrees to start Wellbutrin SR 150 mg q AM #30 with no refills and she agrees to follow up with our clinic in 2 to 3 weeks.   Obesity [name] Nolan is currently in the action stage of change. As such, her goal is to continue with weight loss efforts She has agreed to follow the Category 2 plan [name] Nolan has been instructed to work up to a goal of 150 minutes of combined cardio and strengthening exercise per week for weight loss and overall health benefits. We discussed the following Behavioral Modification Strategies today: increasing lean protein intake, decreasing simple carbohydrates, no skipping meals,  and emotional eating strategies [name] Nolan was reassured she is losing weight at a healthy pace.  [name] Nolan has agreed to follow up with our clinic in 2 to 3 weeks. She was informed of the importance of frequent follow up visits to maximize her success with intensive lifestyle modifications for her multiple health conditions.   OBESITY BEHAVIORAL INTERVENTION VISIT  Today's visit was # 4 out of 22.  Starting weight: 215 lbs Starting date: 11/02/17 Today's weight : 211 lbs  Today's date: 01/04/2018 Total lbs lost to date: 4 (Patients must lose 7 lbs in the first 6 months to continue with counseling)   ASK: We discussed the diagnosis of obesity with [name] Nolan today and [place] agreed to give [place] permission to discuss obesity behavioral modification therapy today.  ASSESS: [name] Nolan has the diagnosis of obesity and her BMI today is 35.11 [name] Nolan is in the action stage of change   ADVISE: Rossana was educated on the multiple health risks of obesity as well as the benefit of weight loss to improve her health. She was advised of the need for long term treatment and the importance of lifestyle modifications.  AGREE: Multiple dietary modification options and treatment options were discussed and  [name] Nolan agreed to the above obesity treatment plan.  I, Trixie Dredge, am acting as  transcriptionist for Dennard Nip, MD  I have reviewed the above documentation for accuracy and completeness, and I agree with the above. -Dennard Nip, MD

## 2018-01-13 ENCOUNTER — Ambulatory Visit (HOSPITAL_BASED_OUTPATIENT_CLINIC_OR_DEPARTMENT_OTHER)
Admission: RE | Admit: 2018-01-13 | Discharge: 2018-01-13 | Disposition: A | Discharge status: Home / Self Care | Payer: Medicare Other | Source: Ambulatory Visit | Attending: Family Medicine | Admitting: Family Medicine

## 2018-01-13 DIAGNOSIS — Z1231 Encounter for screening mammogram for malignant neoplasm of breast: Secondary | ICD-10-CM | POA: Diagnosis not present

## 2018-01-13 DIAGNOSIS — Z780 Asymptomatic menopausal state: Secondary | ICD-10-CM | POA: Insufficient documentation

## 2018-01-13 DIAGNOSIS — Z1239 Encounter for other screening for malignant neoplasm of breast: Secondary | ICD-10-CM

## 2018-01-14 ENCOUNTER — Other Ambulatory Visit (HOSPITAL_BASED_OUTPATIENT_CLINIC_OR_DEPARTMENT_OTHER): Payer: Medicare Other

## 2018-01-25 ENCOUNTER — Ambulatory Visit (INDEPENDENT_AMBULATORY_CARE_PROVIDER_SITE_OTHER): Payer: Medicare Other | Admitting: Family Medicine

## 2018-01-25 VITALS — BP 108/75 | HR 105 | Temp 99.2°F | Ht 65.0 in | Wt 213.0 lb

## 2018-01-25 DIAGNOSIS — F3289 Other specified depressive episodes: Secondary | ICD-10-CM | POA: Diagnosis not present

## 2018-01-25 DIAGNOSIS — E559 Vitamin D deficiency, unspecified: Secondary | ICD-10-CM

## 2018-01-25 DIAGNOSIS — Z6835 Body mass index (BMI) 35.0-35.9, adult: Secondary | ICD-10-CM | POA: Diagnosis not present

## 2018-01-25 MED ORDER — VITAMIN D (ERGOCALCIFEROL) 1.25 MG (50000 UNIT) PO CAPS: 50000.0000 [IU] | ORAL_CAPSULE | ORAL | 0 refills | Status: DC

## 2018-01-25 MED ORDER — BUPROPION HCL ER (SR) 200 MG PO TB12: 200.0000 mg | ORAL_TABLET | Freq: Every day | ORAL | 0 refills | Status: DC

## 2018-01-26 NOTE — Progress Notes (Signed)
Office: [phone]  /  Fax: [phone]   HPI:   Chief Complaint: OBESITY [name] Nolan is here to discuss her progress with her obesity treatment plan. She is on the Category 2 plan and is following her eating plan approximately 45 to 50 % of the time. She states she is walking 60 minutes 7 times per week. [name] notes increased stress while caring for her husband who is deaf and losing his sight, and has advanced Parkinson's disease. She has been concentrating on him, instead of herself, so weight loss has not been a priority. Her weight is 213 lb (96.6 kg) today and has had a weight gain of 2 pounds over a period of 3 weeks since her last visit. She has lost 2 lbs since starting treatment with [place].  Vitamin D deficiency [name] has a diagnosis of vitamin D deficiency. She is stable on vit D and denies nausea, vomiting or muscle weakness.  Depression with emotional eating behaviors [name]'s mood decreased  In the last 2 to 3 weeks with caregiver stress and husband;s worsening health. She notes increased emotional eating and doesn't feel her Wellbutrin is helping anymore. [name] struggles with emotional eating and using food for comfort to the extent that it is negatively impacting her health. She often snacks when she is not hungry. [name] sometimes feels she is out of control and then feels guilty that she made poor food choices. She has been working on behavior modification techniques to help reduce her emotional eating and has been somewhat successful. She shows no sign of suicidal or homicidal ideations.  Depression screen [facility] 2/9 11/02/2017 09/03/2017 09/01/2016 08/27/2015 05/29/2015  Decreased Interest 3 0 1 0 0  Down, Depressed, Hopeless 3 0 1 1 0  PHQ - 2 Score 6 0 2 1 0  Altered sleeping 3 - 1 - -  Tired, decreased energy 3 - 0 - -  Change in appetite 3 - 3 - -  Feeling bad or failure about yourself  1 - 0 - -  Trouble concentrating 1 - 0 - -  Moving slowly or fidgety/restless 0 - 0 -  -  Suicidal thoughts 0 - 0 - -  PHQ-9 Score 17 - 6 - -  Difficult doing work/chores Somewhat difficult - - - -      ALLERGIES: Allergies  Allergen Reactions  . Statins Other (See Comments)    Muscle pain  . Codeine Nausea And Vomiting    MEDICATIONS: Current Outpatient Medications on File Prior to Visit  Medication Sig Dispense Refill  . BOTOX 100 units SOLR injection INJECT 155 UNITS INTO FACIAL AND NECK MUSCLES EVERY 3 MONTHS (GIVEN AT MD OFFICE, DISCARD UNUSED PORTION AFTER FIRST USE) 2 vial 3  . cyclobenzaprine (FLEXERIL) 10 MG tablet TAKE 1 TABLET BY MOUTH AT BEDTIME. CAN ALSO TAKE 1/2 -1 TABLET TWICE DAILY FOR HEADACHE 90 tablet 11  . levothyroxine (SYNTHROID) 88 MCG tablet Take 1 tablet by mouth daily.    . rizatriptan (MAXALT-MLT) 10 MG disintegrating tablet Take 1 tablet (10 mg total) by mouth as needed for migraine. May repeat in 2 hours if needed 15 tablet 11  . Venlafaxine HCl 150 MG TB24 Take 1 tablet (150 mg total) by mouth daily. 30 each 5   No current facility-administered medications on file prior to visit.     PAST MEDICAL HISTORY: Past Medical History:  Diagnosis Date  . Acute bronchitis 08/15/2015  . Anxiety   . Anxiety state 05/07/2014   Widowed in  2013 after caring for her husband with Lewy Body Dementia for 6 years   . Arrhythmia 01/25/2015   Per Dr. Jaynee Eagles, [facility]; hx PVC  . Arthritis    "knees" (11/07/2015)  . 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 Basal cell carcinoma of right ear 02/26/2015   Removed by Dr Syble Creek  . Chronic back pain    "mid-back; stops at the very lowest part of my back" (11/07/2015)  . Colon polyp 09/03/2017  . Constipation 02/25/2016  . Depression   . Dysphagia   . Dyspnea   . Esophageal reflux    occ  . Fainting    fainted twice  . Family history of adverse reaction to anesthesia    "daughter gets bad PONV"  . Gallbladder problem   . Hashimoto's disease   . Heart murmur   . Hot flashes 05/27/2016    . Hyperlipidemia   . Hypothyroid   . Insomnia   . Joint pain   . Low ferritin 08/27/2015   "took supplements for awhile" (11/07/2015)  . Medicare annual wellness visit, subsequent 08/27/2015  . Menopause   . Migraine    "under control w/daily RX right now" (11/07/2015)  . Occipital neuralgia   . Osteopenia 02/26/2015  . Osteoporosis    osteopenia  . Preventative health care 08/27/2015  . Prolonged depressive reaction   . Swallowing difficulty   . Vitamin B12 deficiency 09/01/2016  . Vitamin D deficiency 03/02/2017    PAST SURGICAL HISTORY: Past Surgical History:  Procedure Laterality Date  . APPENDECTOMY  11/07/2015  . BASAL CELL CARCINOMA EXCISION Right 02/26/2015   ear  . CHOLECYSTECTOMY OPEN  1974  . COLONOSCOPY  2006  . LAPAROSCOPIC APPENDECTOMY N/A 11/07/2015   Procedure: APPENDECTOMY LAPAROSCOPIC;  Surgeon: Georganna Skeans, MD;  Location: [facility];  Service: General;  Laterality: N/A;  . LAPAROSCOPIC INCISIONAL / UMBILICAL / [place]  11/07/2015   UHR  . Tooth implant     at least 5 years ago per pt  . TUBAL LIGATION  1973  . UMBILICAL HERNIA REPAIR N/A 11/07/2015   Procedure: LAPAROSCOPIC UMBILICAL HERNIA;  Surgeon: Georganna Skeans, MD;  Location: [place];  Service: General;  Laterality: N/A;    SOCIAL HISTORY: Social History   Tobacco Use  . Smoking status: Never Smoker  . Smokeless tobacco: Never Used  Substance Use Topics  . Alcohol use: No    Alcohol/week: 0.0 oz  . Drug use: No    FAMILY HISTORY: Family History  Problem Relation Age of Onset  . Congestive Heart Failure Mother   . Hypertension Mother   . Hyperlipidemia Mother   . Heart disease Mother   . Obesity Mother   . Leukemia Father   . Obesity Father   . Kidney disease Brother   . Cancer Brother        stage 4 kidney cancer, metastatic  . Kidney cancer Brother   . Leukemia Maternal Aunt   . Congestive Heart Failure Maternal Grandmother   . Arthritis Sister   . Colon cancer Neg Hx      ROS: Review of Systems  Constitutional: Negative for weight loss.  Gastrointestinal: Negative for nausea and vomiting.  Musculoskeletal:       Negative for muscle weakness  Psychiatric/Behavioral: Positive for depression. Negative for suicidal ideas.       Positive for Stress    PHYSICAL EXAM: Blood pressure 108/75, pulse (!) 105, temperature 99.2 F (37.3 C), temperature source Oral, height 5'  5" (1.651 m), weight 213 lb (96.6 kg), SpO2 93 %. Body mass index is 35.45 kg/m. Physical Exam  Constitutional: She is oriented to person, place, and time. She appears well-developed and well-nourished.  Cardiovascular: Normal rate.  Pulmonary/Chest: Effort normal.  Musculoskeletal: Normal range of motion.  Neurological: She is oriented to person, place, and time.  Skin: Skin is warm and dry.  Psychiatric: She has a normal mood and affect. Her behavior is normal.  Vitals reviewed.   RECENT LABS AND TESTS: BMET    Component Value Date/Time   NA 141 11/02/2017 1109   K 4.4 11/02/2017 1109   CL 101 11/02/2017 1109   CO2 21 11/02/2017 1109   GLUCOSE 85 11/02/2017 1109   GLUCOSE 71 09/03/2017 1446   BUN 10 11/02/2017 1109   CREATININE 0.97 11/02/2017 1109   CALCIUM 9.8 11/02/2017 1109   GFRNONAA 59 (L) 11/02/2017 1109   GFRAA 68 11/02/2017 1109   Lab Results  Component Value Date   HGBA1C 5.4 11/02/2017   Lab Results  Component Value Date   INSULIN 12.4 11/02/2017   CBC    Component Value Date/Time   WBC 6.0 11/02/2017 1109   WBC 6.1 09/03/2017 1446   RBC 4.18 11/02/2017 1109   RBC 4.34 09/03/2017 1446   HGB 10.4 (L) 11/02/2017 1109   HCT 33.8 (L) 11/02/2017 1109   PLT 340.0 09/03/2017 1446   PLT 272 04/25/2015 1533   MCV 81 11/02/2017 1109   MCH 24.9 (L) 11/02/2017 1109   MCH 29.5 11/07/2015 1324   MCHC 30.8 (L) 11/02/2017 1109   MCHC 31.6 09/03/2017 1446   RDW 15.7 (H) 11/02/2017 1109   LYMPHSABS 1.4 11/02/2017 1109   MONOABS 0.5 12/13/2014 1621   EOSABS  0.2 11/02/2017 1109   BASOSABS 0.1 11/02/2017 1109   Iron/TIBC/Ferritin/ %Sat    Component Value Date/Time   FERRITIN 6.1 (L) 09/03/2017 1446   FERRITIN 36 05/24/2015 1535   Lipid Panel     Component Value Date/Time   CHOL 229 (H) 11/02/2017 1109   TRIG 182 (H) 11/02/2017 1109   HDL 57 11/02/2017 1109   CHOLHDL 4 09/03/2017 1446   VLDL 36.0 09/03/2017 1446   LDLCALC 136 (H) 11/02/2017 1109   Hepatic Function Panel     Component Value Date/Time   PROT 7.3 11/02/2017 1109   ALBUMIN 4.4 11/02/2017 1109   AST 26 11/02/2017 1109   ALT 17 11/02/2017 1109   ALKPHOS 117 11/02/2017 1109   BILITOT 0.2 11/02/2017 1109      Component Value Date/Time   TSH 5.880 (H) 11/02/2017 1109   TSH 2.31 09/03/2017 1446   TSH 1.29 09/01/2016 1528   Results for PERMELIA, BAMBA (MRN [id number]) as of 01/26/2018 11:27  Ref. Range 11/02/2017 11:09  Vitamin D, 25-Hydroxy Latest Ref Range: 30.0 - 100.0 ng/mL 25.3 (L)   ASSESSMENT AND PLAN: Vitamin D deficiency - Plan: Vitamin D, Ergocalciferol, (DRISDOL) 50000 units CAPS capsule  Other depression - with emotional eating - Plan: buPROPion (WELLBUTRIN SR) 200 MG 12 hr tablet  Class 2 severe obesity with serious comorbidity and body mass index (BMI) of 35.0 to 35.9 in adult, unspecified obesity type (HCC)  PLAN:  Vitamin D Deficiency Alzena was informed that low vitamin D levels contributes to fatigue and are associated with obesity, breast, and colon cancer. She agrees to continue to take prescription Vit D @50 ,000 IU every week #4 with no refills and will follow up for routine testing of vitamin D, at  least 2-3 times per year. She was informed of the risk of over-replacement of vitamin D and agrees to not increase her dose unless she discusses this with [place] first. Burgandy agrees to follow up with our clinic in 2 to 3 weeks.  Depression with Emotional Eating Behaviors We discussed behavior modification techniques today to help Teylor deal with  her emotional eating and depression. She has agreed to increase Wellbutrin SR to 200 mg qAM #30 with no refills and follow up as directed.  Obesity Joyell is currently in the action stage of change. As such, her goal is to continue with weight loss efforts She has agreed to portion control better and make smarter food choices, such as increase vegetables and decrease simple carbohydrates  Darlis has been instructed to work up to a goal of 150 minutes of combined cardio and strengthening exercise per week for weight loss and overall health benefits. We discussed the following Behavioral Modification Strategies today: increasing lean protein intake, decreasing simple carbohydrates  and emotional eating strategies  Koda has agreed to follow up with our clinic in 2 to 3 weeks fasting. She was informed of the importance of frequent follow up visits to maximize her success with intensive lifestyle modifications for her multiple health conditions.   OBESITY BEHAVIORAL INTERVENTION VISIT  Today's visit was # 5 out of 22.  Starting weight: 215 lbs Starting date: 11/02/17 Today's weight : 213 lbs Today's date: 01/25/2018 Total lbs lost to date: 2 (Patients must lose 7 lbs in the first 6 months to continue with counseling)   ASK: We discussed the diagnosis of obesity with Margee Stovall-Edwards today and Latifah agreed to give [place] permission to discuss obesity behavioral modification therapy today.  ASSESS: Merrit has the diagnosis of obesity and her BMI today is 35.44 Natisha is in the action stage of change   ADVISE: Nixon was educated on the multiple health risks of obesity as well as the benefit of weight loss to improve her health. She was advised of the need for long term treatment and the importance of lifestyle modifications.  AGREE: Multiple dietary modification options and treatment options were discussed and  Everest agreed to the above obesity treatment plan.  I, Doreene Nest, am  acting as transcriptionist for Dennard Nip, MD  I have reviewed the above documentation for accuracy and completeness, and I agree with the above. -Dennard Nip, MD

## 2018-02-02 ENCOUNTER — Encounter: Payer: Self-pay | Admitting: Neurology

## 2018-02-05 NOTE — Telephone Encounter (Signed)
Spoke with associate at [facility] who confirmed that they have a prescription that was on hold for Sinemet IR 25-100 every 3 hours. Pt also has other prescription for Sinemet CR at bedtime. They will fill the Sinemet IR for the patient per pt's wife's request (she is on DPR) but it can only be a one month supply d/t pt not having Rx insurance.

## 2018-02-17 ENCOUNTER — Other Ambulatory Visit (INDEPENDENT_AMBULATORY_CARE_PROVIDER_SITE_OTHER): Payer: Self-pay | Admitting: Family Medicine

## 2018-02-17 DIAGNOSIS — F3289 Other specified depressive episodes: Secondary | ICD-10-CM

## 2018-02-18 ENCOUNTER — Ambulatory Visit (INDEPENDENT_AMBULATORY_CARE_PROVIDER_SITE_OTHER): Payer: Medicare Other | Admitting: Family Medicine

## 2018-02-18 VITALS — BP 119/79 | HR 82 | Temp 98.2°F | Ht 65.0 in | Wt 217.0 lb

## 2018-02-18 DIAGNOSIS — E559 Vitamin D deficiency, unspecified: Secondary | ICD-10-CM | POA: Diagnosis not present

## 2018-02-18 DIAGNOSIS — F3289 Other specified depressive episodes: Secondary | ICD-10-CM | POA: Diagnosis not present

## 2018-02-18 DIAGNOSIS — Z6836 Body mass index (BMI) 36.0-36.9, adult: Secondary | ICD-10-CM

## 2018-02-18 MED ORDER — BUPROPION HCL ER (SR) 200 MG PO TB12: 200.0000 mg | ORAL_TABLET | Freq: Every day | ORAL | 1 refills | Status: DC

## 2018-02-18 MED ORDER — VITAMIN D (ERGOCALCIFEROL) 1.25 MG (50000 UNIT) PO CAPS: 50000.0000 [IU] | ORAL_CAPSULE | ORAL | 1 refills | Status: DC

## 2018-02-18 NOTE — Progress Notes (Signed)
Office: [phone]  /  Fax: [phone]   HPI:   Chief Complaint: OBESITY Emilianna is here to discuss her progress with her obesity treatment plan. She is on the portion control better and make smarter food choices plan and is following her eating plan approximately 50 % of the time. She states she is walking for 60 minutes 7 times per week. Suezette is continually under stress caring for her husband, and isn't able to concentrate on weight loss. She is still comfort eating significantly, and is frustrated with herself. Her weight is 217 lb (98.4 kg) today and has had a weight gain  of 4 pounds over a period of 3 weeks since her last visit. She has gained 2 lbs since starting treatment with [place].  Vitamin D deficiency Demoni has a diagnosis of vitamin D deficiency. She is stable on vit D and denies nausea, vomiting or muscle weakness.  Depression with emotional eating behaviors Takyla increased Wellbutrin, but she still struggles with emotional eating, especially while under stress with husband's constant care and no help at home. Ellary struggles with emotional eating and using food for comfort to the extent that it is negatively impacting her health. She often snacks when she is not hungry. Kehaulani sometimes feels she is out of control and then feels guilty that she made poor food choices. She has been working on behavior modification techniques to help reduce her emotional eating and has been somewhat successful. She shows no sign of suicidal or homicidal ideations.  Depression screen [facility] 2/9 11/02/2017 09/03/2017 09/01/2016 08/27/2015 05/29/2015  Decreased Interest 3 0 1 0 0  Down, Depressed, Hopeless 3 0 1 1 0  PHQ - 2 Score 6 0 2 1 0  Altered sleeping 3 - 1 - -  Tired, decreased energy 3 - 0 - -  Change in appetite 3 - 3 - -  Feeling bad or failure about yourself  1 - 0 - -  Trouble concentrating 1 - 0 - -  Moving slowly or fidgety/restless 0 - 0 - -  Suicidal thoughts 0 - 0 - -  PHQ-9  Score 17 - 6 - -  Difficult doing work/chores Somewhat difficult - - - -      ALLERGIES: Allergies  Allergen Reactions  . Statins Other (See Comments)    Muscle pain  . Codeine Nausea And Vomiting    MEDICATIONS: Current Outpatient Medications on File Prior to Visit  Medication Sig Dispense Refill  . BOTOX 100 units SOLR injection INJECT 155 UNITS INTO FACIAL AND NECK MUSCLES EVERY 3 MONTHS (GIVEN AT MD OFFICE, DISCARD UNUSED PORTION AFTER FIRST USE) 2 vial 3  . cyclobenzaprine (FLEXERIL) 10 MG tablet TAKE 1 TABLET BY MOUTH AT BEDTIME. CAN ALSO TAKE 1/2 -1 TABLET TWICE DAILY FOR HEADACHE 90 tablet 11  . levothyroxine (SYNTHROID) 88 MCG tablet Take 1 tablet by mouth daily.    . rizatriptan (MAXALT-MLT) 10 MG disintegrating tablet Take 1 tablet (10 mg total) by mouth as needed for migraine. May repeat in 2 hours if needed 15 tablet 11  . Venlafaxine HCl 150 MG TB24 Take 1 tablet (150 mg total) by mouth daily. 30 each 5   No current facility-administered medications on file prior to visit.     PAST MEDICAL HISTORY: Past Medical History:  Diagnosis Date  . Acute bronchitis 08/15/2015  . Anxiety   . Anxiety state 05/07/2014   Widowed in 2013 after caring for her husband with Lewy Body Dementia for 6  years   . Arrhythmia 01/25/2015   Per Dr. Jaynee Eagles, [facility]; hx PVC  . Arthritis    "knees" (11/07/2015)  . 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 Basal cell carcinoma of right ear 02/26/2015   Removed by Dr Syble Creek  . Chronic back pain    "mid-back; stops at the very lowest part of my back" (11/07/2015)  . Colon polyp 09/03/2017  . Constipation 02/25/2016  . Depression   . Dysphagia   . Dyspnea   . Esophageal reflux    occ  . Fainting    fainted twice  . Family history of adverse reaction to anesthesia    "daughter gets bad PONV"  . Gallbladder problem   . Hashimoto's disease   . Heart murmur   . Hot flashes 05/27/2016  . Hyperlipidemia   . Hypothyroid   .  Insomnia   . Joint pain   . Low ferritin 08/27/2015   "took supplements for awhile" (11/07/2015)  . Medicare annual wellness visit, subsequent 08/27/2015  . Menopause   . Migraine    "under control w/daily RX right now" (11/07/2015)  . Occipital neuralgia   . Osteopenia 02/26/2015  . Osteoporosis    osteopenia  . Preventative health care 08/27/2015  . Prolonged depressive reaction   . Swallowing difficulty   . Vitamin B12 deficiency 09/01/2016  . Vitamin D deficiency 03/02/2017    PAST SURGICAL HISTORY: Past Surgical History:  Procedure Laterality Date  . APPENDECTOMY  11/07/2015  . BASAL CELL CARCINOMA EXCISION Right 02/26/2015   ear  . CHOLECYSTECTOMY OPEN  1974  . COLONOSCOPY  2006  . LAPAROSCOPIC APPENDECTOMY N/A 11/07/2015   Procedure: APPENDECTOMY LAPAROSCOPIC;  Surgeon: Georganna Skeans, MD;  Location: [facility];  Service: General;  Laterality: N/A;  . LAPAROSCOPIC INCISIONAL / UMBILICAL / [place]  11/07/2015   UHR  . Tooth implant     at least 5 years ago per pt  . TUBAL LIGATION  1973  . UMBILICAL HERNIA REPAIR N/A 11/07/2015   Procedure: LAPAROSCOPIC UMBILICAL HERNIA;  Surgeon: Georganna Skeans, MD;  Location: [facility];  Service: General;  Laterality: N/A;    SOCIAL HISTORY: Social History   Tobacco Use  . Smoking status: Never Smoker  . Smokeless tobacco: Never Used  Substance Use Topics  . Alcohol use: No    Alcohol/week: 0.0 oz  . Drug use: No    FAMILY HISTORY: Family History  Problem Relation Age of Onset  . Congestive Heart Failure Mother   . Hypertension Mother   . Hyperlipidemia Mother   . Heart disease Mother   . Obesity Mother   . Leukemia Father   . Obesity Father   . Kidney disease Brother   . Cancer Brother        stage 4 kidney cancer, metastatic  . Kidney cancer Brother   . Leukemia Maternal Aunt   . Congestive Heart Failure Maternal Grandmother   . Arthritis Sister   . Colon cancer Neg Hx     ROS: Review of Systems    Constitutional: Negative for weight loss.  Gastrointestinal: Negative for nausea and vomiting.  Musculoskeletal:       Negative for muscle weakness  Psychiatric/Behavioral: Positive for depression. Negative for suicidal ideas.       Positive for Stress    PHYSICAL EXAM: Blood pressure 119/79, pulse 82, temperature 98.2 F (36.8 C), temperature source Oral, height 5\' 5"  (1.651 m), weight 217 lb (98.4 kg), SpO2 95 %. Body mass  index is 36.11 kg/m. Physical Exam  RECENT LABS AND TESTS: BMET    Component Value Date/Time   NA 141 11/02/2017 1109   K 4.4 11/02/2017 1109   CL 101 11/02/2017 1109   CO2 21 11/02/2017 1109   GLUCOSE 85 11/02/2017 1109   GLUCOSE 71 09/03/2017 1446   BUN 10 11/02/2017 1109   CREATININE 0.97 11/02/2017 1109   CALCIUM 9.8 11/02/2017 1109   GFRNONAA 59 (L) 11/02/2017 1109   GFRAA 68 11/02/2017 1109   Lab Results  Component Value Date   HGBA1C 5.4 11/02/2017   Lab Results  Component Value Date   INSULIN 12.4 11/02/2017   CBC    Component Value Date/Time   WBC 6.0 11/02/2017 1109   WBC 6.1 09/03/2017 1446   RBC 4.18 11/02/2017 1109   RBC 4.34 09/03/2017 1446   HGB 10.4 (L) 11/02/2017 1109   HCT 33.8 (L) 11/02/2017 1109   PLT 340.0 09/03/2017 1446   PLT 272 04/25/2015 1533   MCV 81 11/02/2017 1109   MCH 24.9 (L) 11/02/2017 1109   MCH 29.5 11/07/2015 1324   MCHC 30.8 (L) 11/02/2017 1109   MCHC 31.6 09/03/2017 1446   RDW 15.7 (H) 11/02/2017 1109   LYMPHSABS 1.4 11/02/2017 1109   MONOABS 0.5 12/13/2014 1621   EOSABS 0.2 11/02/2017 1109   BASOSABS 0.1 11/02/2017 1109   Iron/TIBC/Ferritin/ %Sat    Component Value Date/Time   FERRITIN 6.1 (L) 09/03/2017 1446   FERRITIN 36 05/24/2015 1535   Lipid Panel     Component Value Date/Time   CHOL 229 (H) 11/02/2017 1109   TRIG 182 (H) 11/02/2017 1109   HDL 57 11/02/2017 1109   CHOLHDL 4 09/03/2017 1446   VLDL 36.0 09/03/2017 1446   LDLCALC 136 (H) 11/02/2017 1109   Hepatic Function Panel      Component Value Date/Time   PROT 7.3 11/02/2017 1109   ALBUMIN 4.4 11/02/2017 1109   AST 26 11/02/2017 1109   ALT 17 11/02/2017 1109   ALKPHOS 117 11/02/2017 1109   BILITOT 0.2 11/02/2017 1109      Component Value Date/Time   TSH 5.880 (H) 11/02/2017 1109   TSH 2.31 09/03/2017 1446   TSH 1.29 09/01/2016 1528   Results for ANELISE, STARON (MRN [id number]) as of 02/18/2018 12:03  Ref. Range 11/02/2017 11:09  Vitamin D, 25-Hydroxy Latest Ref Range: 30.0 - 100.0 ng/mL 25.3 (L)   ASSESSMENT AND PLAN: Vitamin D deficiency - Plan: Vitamin D, Ergocalciferol, (DRISDOL) 50000 units CAPS capsule  Other depression - with emotional eating - Plan: buPROPion (WELLBUTRIN SR) 200 MG 12 hr tablet  Class 2 severe obesity with serious comorbidity and body mass index (BMI) of 36.0 to 36.9 in adult, unspecified obesity type (HCC)  PLAN:  Vitamin D Deficiency Hermela was informed that low vitamin D levels contributes to fatigue and are associated with obesity, breast, and colon cancer. She agrees to continue to take prescription Vit D @50 ,000 IU every week. We will refill for 2 months and she will follow up for routine testing of vitamin D, at least 2-3 times per year. She was informed of the risk of over-replacement of vitamin D and agrees to not increase her dose unless she discusses this with [place] first. Saleen agrees to follow up as directed.  Depression with Emotional Eating Behaviors We discussed behavior modification techniques today to help Shiya deal with her emotional eating and depression. She has agreed to continue Wellbutrin SR 150 mg daily. We will refill for  2 months and she agreed to follow up as directed.  Obesity Katessa is currently in the action stage of change. As such, her goal is to continue with weight loss efforts She has agreed to change to maintaining weight for the next 4 to 6 weeks while she deals with her husband's health. She has agreed to portion control better and  make smarter food choices, such as increase vegetables and decrease simple carbohydrates  Meko has been instructed to work up to a goal of 150 minutes of combined cardio and strengthening exercise per week for weight loss and overall health benefits. We discussed the following Behavioral Modification Strategies today: increasing lean protein intake, work on meal planning and easy cooking plans and emotional eating strategies  Winnifred has agreed to follow up with our clinic in 4 to 6 weeks. She was informed of the importance of frequent follow up visits to maximize her success with intensive lifestyle modifications for her multiple health conditions.   OBESITY BEHAVIORAL INTERVENTION VISIT  Today's visit was # 6 out of 22.  Starting weight: 215 lbs Starting date: 11/02/17 Today's weight : 217 lbs Today's date: 02/18/2018 Total lbs lost to date: 0 (Patients must lose 7 lbs in the first 6 months to continue with counseling)   ASK: We discussed the diagnosis of obesity with Lainee Stovall-Edwards today and Blanka agreed to give [place] permission to discuss obesity behavioral modification therapy today.  ASSESS: Latisha has the diagnosis of obesity and her BMI today is 36.11 Aparna is in the action stage of change   ADVISE: Naylani was educated on the multiple health risks of obesity as well as the benefit of weight loss to improve her health. She was advised of the need for long term treatment and the importance of lifestyle modifications.  AGREE: Multiple dietary modification options and treatment options were discussed and  Shekita agreed to the above obesity treatment plan.  I, Doreene Nest, am acting as transcriptionist for Dennard Nip, MD  I have reviewed the above documentation for accuracy and completeness, and I agree with the above. -Dennard Nip, MD

## 2018-03-04 ENCOUNTER — Other Ambulatory Visit: Payer: Self-pay | Admitting: Neurology

## 2018-03-04 DIAGNOSIS — G43001 Migraine without aura, not intractable, with status migrainosus: Secondary | ICD-10-CM

## 2018-03-25 ENCOUNTER — Ambulatory Visit (INDEPENDENT_AMBULATORY_CARE_PROVIDER_SITE_OTHER): Payer: Medicare Other | Admitting: Neurology

## 2018-03-25 VITALS — BP 129/81 | HR 96 | Ht 65.0 in | Wt 226.4 lb

## 2018-03-25 DIAGNOSIS — G43719 Chronic migraine without aura, intractable, without status migrainosus: Secondary | ICD-10-CM

## 2018-03-25 NOTE — Progress Notes (Signed)
Botox- 100 units x 2 vials Lot: [id number] Expiration: 09/2020 NDC: 7227-7375-05  Bacteriostatic 0.9% Sodium Chloride- 43mL total Lot: [id number] Expiration: 01/14/2019 NDC: 2479-9800-12  Dx: J93.594 S/P  Consent on file

## 2018-03-25 NOTE — Progress Notes (Signed)
Consent Form Botulism Toxin Injection For Chronic Migraine  Interval history: Tremendous improvement, >> 50% decrease in frequency and severity  Reviewed orally with patient, additionally signature is on file:  Botulism toxin has been approved by the Federal drug administration for treatment of chronic migraine. Botulism toxin does not cure chronic migraine and it may not be effective in some patients.  The administration of botulism toxin is accomplished by injecting a small amount of toxin into the muscles of the neck and head. Dosage must be titrated for each individual. Any benefits resulting from botulism toxin tend to wear off after 3 months with a repeat injection required if benefit is to be maintained. Injections are usually done every 3-4 months with maximum effect peak achieved by about 2 or 3 weeks. Botulism toxin is expensive and you should be sure of what costs you will incur resulting from the injection.  The side effects of botulism toxin use for chronic migraine may include:   -Transient, and usually mild, facial weakness with facial injections  -Transient, and usually mild, head or neck weakness with head/neck injections  -Reduction or loss of forehead facial animation due to forehead muscle weakness  -Eyelid drooping  -Dry eye  -Pain at the site of injection or bruising at the site of injection  -Double vision  -Potential unknown long term risks  Contraindications: You should not have Botox if you are pregnant, nursing, allergic to albumin, have an infection, skin condition, or muscle weakness at the site of the injection, or have myasthenia gravis, Lambert-Eaton syndrome, or ALS.  It is also possible that as with any injection, there may be an allergic reaction or no effect from the medication. Reduced effectiveness after repeated injections is sometimes seen and rarely infection at the injection site may occur. All care will be taken to prevent these side effects. If  therapy is given over a long time, atrophy and wasting in the muscle injected may occur. Occasionally the patient's become refractory to treatment because they develop antibodies to the toxin. In this event, therapy needs to be modified.  I have read the above information and consent to the administration of botulism toxin.    BOTOX PROCEDURE NOTE FOR MIGRAINE HEADACHE    Contraindications and precautions discussed with patient(above). Aseptic procedure was observed and patient tolerated procedure. Procedure performed by Dr. Georgia Dom  The condition has existed for more than 6 months, and pt does not have a diagnosis of ALS, Myasthenia Gravis or Lambert-Eaton Syndrome.  Risks and benefits of injections discussed and pt agrees to proceed with the procedure.  Written consent obtained  These injections are medically necessary. Pt  receives good benefits from these injections. These injections do not cause sedations or hallucinations which the oral therapies may cause.  Indication/Diagnosis: chronic migraine BOTOX(J0585) injection was performed according to protocol by [employer]. 200 units of BOTOX was dissolved into 4 cc NS.   NDC: [address]-09   Description of procedure:  The patient was placed in a sitting position. The standard protocol was used for Botox as follows, with 5 units of Botox injected at each site:   -Procerus muscle, midline injection  -Corrugator muscle, bilateral injection  -Frontalis muscle, bilateral injection, with 2 sites each side, medial injection was performed in the upper one third of the frontalis muscle, in the region vertical from the medial inferior edge of the superior orbital rim. The lateral injection was again in the upper one third of the forehead vertically above the lateral  limbus of the cornea, 1.5 cm lateral to the medial injection site.  -Temporalis muscle injection, 4 sites, bilaterally. The first injection was 3 cm above the tragus of the ear,  second injection site was 1.5 cm to 3 cm up from the first injection site in line with the tragus of the ear. The third injection site was 1.5-3 cm forward between the first 2 injection sites. The fourth injection site was 1.5 cm posterior to the second injection site.  -Occipitalis muscle injection, 3 sites, bilaterally. The first injection was done one half way between the occipital protuberance and the tip of the mastoid process behind the ear. The second injection site was done lateral and superior to the first, 1 fingerbreadth from the first injection. The third injection site was 1 fingerbreadth superiorly and medially from the first injection site.  -Cervical paraspinal muscle injection, 2 sites, bilateral knee first injection site was 1 cm from the midline of the cervical spine, 3 cm inferior to the lower border of the occipital protuberance. The second injection site was 1.5 cm superiorly and laterally to the first injection site.  -Trapezius muscle injection was performed at 3 sites, bilaterally. The first injection site was in the upper trapezius muscle halfway between the inflection point of the neck, and the acromion. The second injection site was one half way between the acromion and the first injection site. The third injection was done between the first injection site and the inflection point of the neck.   Will return for repeat injection in 3 months.   A 200 unit sof Botox was used, 155 units were injected, the rest of the Botox was wasted. The patient tolerated the procedure well, there were no complications of the above procedure.

## 2018-03-31 ENCOUNTER — Ambulatory Visit (INDEPENDENT_AMBULATORY_CARE_PROVIDER_SITE_OTHER): Payer: Medicare Other | Admitting: Family Medicine

## 2018-03-31 VITALS — BP 105/70 | HR 75 | Temp 98.6°F | Ht 65.0 in | Wt 220.0 lb

## 2018-03-31 DIAGNOSIS — F3289 Other specified depressive episodes: Secondary | ICD-10-CM | POA: Diagnosis not present

## 2018-03-31 DIAGNOSIS — Z6836 Body mass index (BMI) 36.0-36.9, adult: Secondary | ICD-10-CM

## 2018-03-31 NOTE — Progress Notes (Signed)
Office: [phone]  /  Fax: [phone]   HPI:   Chief Complaint: OBESITY [name] Nolan is here to discuss her progress with her obesity treatment plan. She is on the portion control better and make smarter food choices, such as increase vegetables and decrease simple carbohydrates and is following her eating plan approximately 0 % of the time. She states she is walking for 30-60 minutes 7 times per week. [name] Nolan is off track while her husband's health worsens. She states she is not in the action stage of change currently but worried about more weight gain.  Her weight is 220 lb (99.8 kg) today and has gained 3 pounds since her last visit. She has lost 0 lbs since starting treatment with [place].  Depression with emotional eating behaviors [name] Nolan's Wellbutrin increased to 200 mg q AM but this caused xerostomia and she had increased mouth lesions so stopped 1 week ago and states she is now feeling better. [name] Nolan struggles with emotional eating and using food for comfort to the extent that it is negatively impacting her health. She often snacks when she is not hungry. [name] Nolan sometimes feels she is out of control and then feels guilty that she made poor food choices. She has been working on behavior modification techniques to help reduce her emotional eating and has been somewhat successful. She shows no sign of suicidal or homicidal ideations.  Depression screen [facility] 2/9 11/02/2017 09/03/2017 09/01/2016 08/27/2015 05/29/2015  Decreased Interest 3 0 1 0 0  Down, Depressed, Hopeless 3 0 1 1 0  PHQ - 2 Score 6 0 2 1 0  Altered sleeping 3 - 1 - -  Tired, decreased energy 3 - 0 - -  Change in appetite 3 - 3 - -  Feeling bad or failure about yourself  1 - 0 - -  Trouble concentrating 1 - 0 - -  Moving slowly or fidgety/restless 0 - 0 - -  Suicidal thoughts 0 - 0 - -  PHQ-9 Score 17 - 6 - -  Difficult doing work/chores Somewhat difficult - - - -    ALLERGIES: Allergies  Allergen Reactions  . Statins  Other (See Comments)    Muscle pain  . Codeine Nausea And Vomiting    MEDICATIONS: Current Outpatient Medications on File Prior to Visit  Medication Sig Dispense Refill  . BOTOX 100 units SOLR injection INJECT 155 UNITS INTO FACIAL AND NECK MUSCLES EVERY 3 MONTHS (GIVEN AT MD OFFICE, DISCARD UNUSED PORTION AFTER FIRST USE) 2 vial 3  . cyclobenzaprine (FLEXERIL) 10 MG tablet TAKE 1 TABLET BY MOUTH AT BEDTIME. CAN ALSO TAKE 1/2 -1 TABLET TWICE DAILY FOR HEADACHE 90 tablet 11  . levothyroxine (SYNTHROID) 88 MCG tablet Take 1 tablet by mouth daily.    . rizatriptan (MAXALT-MLT) 10 MG disintegrating tablet Take 1 tablet (10 mg total) by mouth as needed for migraine. May repeat in 2 hours if needed 15 tablet 11  . Venlafaxine HCl 150 MG TB24 Take 1 tablet (150 mg total) by mouth daily. 30 each 5  . Vitamin D, Ergocalciferol, (DRISDOL) 50000 units CAPS capsule Take 1 capsule (50,000 Units total) by mouth every 7 (seven) days. 4 capsule 1   No current facility-administered medications on file prior to visit.     PAST MEDICAL HISTORY: Past Medical History:  Diagnosis Date  . Acute bronchitis 08/15/2015  . Anxiety   . Anxiety state 05/07/2014   Widowed in 2013 after caring for her husband with Lewy Body Dementia for  6 years   . Arrhythmia 01/25/2015   Per Dr. Jaynee Eagles, [facility]; hx PVC  . Arthritis    "knees" (11/07/2015)  . 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 Basal cell carcinoma of right ear 02/26/2015   Removed by Dr [place]  . Chronic back pain    "mid-back; stops at the very lowest part of my back" (11/07/2015)  . Colon polyp 09/03/2017  . Constipation 02/25/2016  . Depression   . Dysphagia   . Dyspnea   . Esophageal reflux    occ  . Fainting    fainted twice  . Family history of adverse reaction to anesthesia    "daughter gets bad PONV"  . Gallbladder problem   . Hashimoto's disease   . Heart murmur   . Hot flashes 05/27/2016  . Hyperlipidemia   . Hypothyroid    . Insomnia   . Joint pain   . Low ferritin 08/27/2015   "took supplements for awhile" (11/07/2015)  . Medicare annual wellness visit, subsequent 08/27/2015  . Menopause   . Migraine    "under control w/daily RX right now" (11/07/2015)  . Occipital neuralgia   . Osteopenia 02/26/2015  . Osteoporosis    osteopenia  . Preventative health care 08/27/2015  . Prolonged depressive reaction   . Swallowing difficulty   . Vitamin B12 deficiency 09/01/2016  . Vitamin D deficiency 03/02/2017    PAST SURGICAL HISTORY: Past Surgical History:  Procedure Laterality Date  . APPENDECTOMY  11/07/2015  . BASAL CELL CARCINOMA EXCISION Right 02/26/2015   ear  . CHOLECYSTECTOMY OPEN  1974  . COLONOSCOPY  2006  . LAPAROSCOPIC APPENDECTOMY N/A 11/07/2015   Procedure: APPENDECTOMY LAPAROSCOPIC;  Surgeon: Georganna Skeans, MD;  Location: [facility];  Service: General;  Laterality: N/A;  . LAPAROSCOPIC INCISIONAL / UMBILICAL / [place]  11/07/2015   UHR  . Tooth implant     at least 5 years ago per pt  . TUBAL LIGATION  1973  . UMBILICAL HERNIA REPAIR N/A 11/07/2015   Procedure: LAPAROSCOPIC UMBILICAL HERNIA;  Surgeon: Georganna Skeans, MD;  Location: [facility];  Service: General;  Laterality: N/A;    SOCIAL HISTORY: Social History   Tobacco Use  . Smoking status: Never Smoker  . Smokeless tobacco: Never Used  Substance Use Topics  . Alcohol use: No    Alcohol/week: 0.0 oz  . Drug use: No    FAMILY HISTORY: Family History  Problem Relation Age of Onset  . Congestive Heart Failure Mother   . Hypertension Mother   . Hyperlipidemia Mother   . Heart disease Mother   . Obesity Mother   . Leukemia Father   . Obesity Father   . Kidney disease Brother   . Cancer Brother        stage 4 kidney cancer, metastatic  . Kidney cancer Brother   . Leukemia Maternal Aunt   . Congestive Heart Failure Maternal Grandmother   . Arthritis Sister   . Colon cancer Neg Hx     ROS: Review of Systems    Constitutional: Negative for weight loss.  Psychiatric/Behavioral: Positive for depression. Negative for suicidal ideas.    PHYSICAL EXAM: Blood pressure 105/70, pulse 75, temperature 98.6 F (37 C), temperature source Oral, height 5\' 5"  (1.651 m), weight 220 lb (99.8 kg), SpO2 94 %. Body mass index is 36.61 kg/m. Physical Exam  Constitutional: She is oriented to person, place, and time. She appears well-developed and well-nourished.  Cardiovascular: Normal rate.  Pulmonary/Chest:  Effort normal.  Musculoskeletal: Normal range of motion.  Neurological: She is oriented to person, place, and time.  Skin: Skin is warm and dry.  Psychiatric: She has a normal mood and affect. Her behavior is normal.  Vitals reviewed.   RECENT LABS AND TESTS: BMET    Component Value Date/Time   NA 141 11/02/2017 1109   K 4.4 11/02/2017 1109   CL 101 11/02/2017 1109   CO2 21 11/02/2017 1109   GLUCOSE 85 11/02/2017 1109   GLUCOSE 71 09/03/2017 1446   BUN 10 11/02/2017 1109   CREATININE 0.97 11/02/2017 1109   CALCIUM 9.8 11/02/2017 1109   GFRNONAA 59 (L) 11/02/2017 1109   GFRAA 68 11/02/2017 1109   Lab Results  Component Value Date   HGBA1C 5.4 11/02/2017   Lab Results  Component Value Date   INSULIN 12.4 11/02/2017   CBC    Component Value Date/Time   WBC 6.0 11/02/2017 1109   WBC 6.1 09/03/2017 1446   RBC 4.18 11/02/2017 1109   RBC 4.34 09/03/2017 1446   HGB 10.4 (L) 11/02/2017 1109   HCT 33.8 (L) 11/02/2017 1109   PLT 340.0 09/03/2017 1446   PLT 272 04/25/2015 1533   MCV 81 11/02/2017 1109   MCH 24.9 (L) 11/02/2017 1109   MCH 29.5 11/07/2015 1324   MCHC 30.8 (L) 11/02/2017 1109   MCHC 31.6 09/03/2017 1446   RDW 15.7 (H) 11/02/2017 1109   LYMPHSABS 1.4 11/02/2017 1109   MONOABS 0.5 12/13/2014 1621   EOSABS 0.2 11/02/2017 1109   BASOSABS 0.1 11/02/2017 1109   Iron/TIBC/Ferritin/ %Sat    Component Value Date/Time   FERRITIN 6.1 (L) 09/03/2017 1446   FERRITIN 36 05/24/2015  1535   Lipid Panel     Component Value Date/Time   CHOL 229 (H) 11/02/2017 1109   TRIG 182 (H) 11/02/2017 1109   HDL 57 11/02/2017 1109   CHOLHDL 4 09/03/2017 1446   VLDL 36.0 09/03/2017 1446   LDLCALC 136 (H) 11/02/2017 1109   Hepatic Function Panel     Component Value Date/Time   PROT 7.3 11/02/2017 1109   ALBUMIN 4.4 11/02/2017 1109   AST 26 11/02/2017 1109   ALT 17 11/02/2017 1109   ALKPHOS 117 11/02/2017 1109   BILITOT 0.2 11/02/2017 1109      Component Value Date/Time   TSH 5.880 (H) 11/02/2017 1109   TSH 2.31 09/03/2017 1446   TSH 1.29 09/01/2016 1528    ASSESSMENT AND PLAN: Other depression - with emotional eating  Class 2 severe obesity with serious comorbidity and body mass index (BMI) of 36.0 to 36.9 in adult, unspecified obesity type (HCC)  PLAN:  Depression with Emotional Eating Behaviors We discussed behavior modification techniques today to help [name] Nolan deal with her emotional eating and depression. [name] Nolan agrees to discontinue, we discussed cognitive behavioral therapy to help decrease emotional eating. [name] Nolan agrees to follow up in 2 months.  We spent > than 50% of the 15 minute visit on the counseling as documented in the note.  Obesity [name] Nolan is not currently in the action stage of change. As such, her goal is to maintain weight for now She has agreed to portion control better and make smarter food choices, such as increase vegetables and decrease simple carbohydrates  [name] Nolan has been instructed to work up to a goal of 150 minutes of combined cardio and strengthening exercise per week for weight loss and overall health benefits. We discussed the following Behavioral Modification Strategies today: work on meal  planning and easy cooking plans, keeping healthy foods in the home, and making better snacking choices We will change goal to maintain weight for now and will follow up in 2 months to reassess readiness to change.  [name] Nolan has agreed to follow up  with our clinic in 8 weeks. She was informed of the importance of frequent follow up visits to maximize her success with intensive lifestyle modifications for her multiple health conditions.   OBESITY BEHAVIORAL INTERVENTION VISIT  Today's visit was # 7 out of 22.  Starting weight: 215 lbs Starting date: 11/02/17 Today's weight : 220 lbs Today's date: 03/31/2018 Total lbs lost to date: 0    ASK: We discussed the diagnosis of obesity with [name] Nolan today and [name] Nolan agreed to give [place] permission to discuss obesity behavioral modification therapy today.  ASSESS: [name] Nolan has the diagnosis of obesity and her BMI today is 36.61 [name] Nolan is in the action stage of change   ADVISE: [name] Nolan was educated on the multiple health risks of obesity as well as the benefit of weight loss to improve her health. She was advised of the need for long term treatment and the importance of lifestyle modifications.  AGREE: Multiple dietary modification options and treatment options were discussed and  [name] Nolan agreed to the above obesity treatment plan.  I, Trixie Dredge, am acting as transcriptionist for Dennard Nip, MD  I have reviewed the above documentation for accuracy and completeness, and I agree with the above. -Dennard Nip, MD

## 2018-04-10 ENCOUNTER — Other Ambulatory Visit (INDEPENDENT_AMBULATORY_CARE_PROVIDER_SITE_OTHER): Payer: Self-pay | Admitting: Family Medicine

## 2018-04-10 DIAGNOSIS — E559 Vitamin D deficiency, unspecified: Secondary | ICD-10-CM

## 2018-04-12 ENCOUNTER — Encounter: Payer: Self-pay | Admitting: Family Medicine

## 2018-04-13 MED ORDER — CYCLOBENZAPRINE HCL 10 MG PO TABS: ORAL_TABLET | ORAL | 11 refills | Status: DC

## 2018-04-15 ENCOUNTER — Encounter: Payer: Self-pay | Admitting: Family Medicine

## 2018-04-15 ENCOUNTER — Telehealth: Payer: Self-pay | Admitting: Family Medicine

## 2018-04-15 MED ORDER — VENLAFAXINE HCL ER 150 MG PO TB24: 150.0000 mg | ORAL_TABLET | Freq: Every day | ORAL | 2 refills | Status: DC

## 2018-04-15 NOTE — Telephone Encounter (Signed)
Copied from [place] [id number]. Topic: Quick Communication - Rx Refill/Question >> Apr 15, 2018 12:48 PM Selinda Flavin B, NT wrote: Medication: Venlafaxine HCl 150 MG TB24 (effexor)  Has the patient contacted their pharmacy? Yes.   (Agent: If no, request that the patient contact the pharmacy for the refill.) (Agent: If yes, when and what did the pharmacy advise?)  Preferred Pharmacy (with phone number or street name): [id number] - [place], [place]: Please be advised that RX refills may take up to 3 business days. We ask that you follow-up with your pharmacy.

## 2018-04-17 ENCOUNTER — Other Ambulatory Visit (INDEPENDENT_AMBULATORY_CARE_PROVIDER_SITE_OTHER): Payer: Self-pay | Admitting: Family Medicine

## 2018-04-17 DIAGNOSIS — F3289 Other specified depressive episodes: Secondary | ICD-10-CM

## 2018-04-19 NOTE — Telephone Encounter (Signed)
Medication was sent 04/15/18

## 2018-04-21 ENCOUNTER — Other Ambulatory Visit: Payer: Self-pay | Admitting: Neurology

## 2018-04-21 MED ORDER — VENLAFAXINE HCL ER 150 MG PO TB24: 150.0000 mg | ORAL_TABLET | Freq: Every day | ORAL | 4 refills | Status: DC

## 2018-04-21 MED ORDER — GABAPENTIN 300 MG PO CAPS: ORAL_CAPSULE | ORAL | 11 refills | Status: DC

## 2018-04-29 ENCOUNTER — Encounter (INDEPENDENT_AMBULATORY_CARE_PROVIDER_SITE_OTHER): Payer: Self-pay | Admitting: Family Medicine

## 2018-05-14 ENCOUNTER — Encounter (INDEPENDENT_AMBULATORY_CARE_PROVIDER_SITE_OTHER): Payer: Self-pay | Admitting: Family Medicine

## 2018-05-15 ENCOUNTER — Encounter (INDEPENDENT_AMBULATORY_CARE_PROVIDER_SITE_OTHER): Payer: Self-pay | Admitting: Family Medicine

## 2018-05-15 ENCOUNTER — Other Ambulatory Visit: Payer: Self-pay | Admitting: Neurology

## 2018-05-19 ENCOUNTER — Encounter (INDEPENDENT_AMBULATORY_CARE_PROVIDER_SITE_OTHER): Payer: Self-pay

## 2018-05-19 ENCOUNTER — Ambulatory Visit (INDEPENDENT_AMBULATORY_CARE_PROVIDER_SITE_OTHER): Payer: Self-pay | Admitting: Family Medicine

## 2018-06-22 ENCOUNTER — Telehealth: Payer: Self-pay | Admitting: Neurology

## 2018-06-22 NOTE — Telephone Encounter (Signed)
I called and spoke with the pharmacy and the patients medication is still pending.

## 2018-06-24 NOTE — Telephone Encounter (Signed)
I called to check status of the medication it is still pending.

## 2018-06-24 NOTE — Telephone Encounter (Signed)
[id number] (09/24/18)

## 2018-06-28 ENCOUNTER — Ambulatory Visit: Payer: Medicare Other | Admitting: Neurology

## 2018-07-01 ENCOUNTER — Ambulatory Visit (INDEPENDENT_AMBULATORY_CARE_PROVIDER_SITE_OTHER): Payer: Medicare Other | Admitting: Neurology

## 2018-07-01 DIAGNOSIS — G43719 Chronic migraine without aura, intractable, without status migrainosus: Secondary | ICD-10-CM

## 2018-07-01 NOTE — Progress Notes (Signed)
Consent Form Botulism Toxin Injection For Chronic Migraine  Interval history: Tremendous improvement, >> 50% decrease in frequency and severity. +masseters and temples and +LS.  Reviewed orally with patient, additionally signature is on file:  Botulism toxin has been approved by the Federal drug administration for treatment of chronic migraine. Botulism toxin does not cure chronic migraine and it may not be effective in some patients.  The administration of botulism toxin is accomplished by injecting a small amount of toxin into the muscles of the neck and head. Dosage must be titrated for each individual. Any benefits resulting from botulism toxin tend to wear off after 3 months with a repeat injection required if benefit is to be maintained. Injections are usually done every 3-4 months with maximum effect peak achieved by about 2 or 3 weeks. Botulism toxin is expensive and you should be sure of what costs you will incur resulting from the injection.  The side effects of botulism toxin use for chronic migraine may include:   -Transient, and usually mild, facial weakness with facial injections  -Transient, and usually mild, head or neck weakness with head/neck injections  -Reduction or loss of forehead facial animation due to forehead muscle weakness  -Eyelid drooping  -Dry eye  -Pain at the site of injection or bruising at the site of injection  -Double vision  -Potential unknown long term risks  Contraindications: You should not have Botox if you are pregnant, nursing, allergic to albumin, have an infection, skin condition, or muscle weakness at the site of the injection, or have myasthenia gravis, Lambert-Eaton syndrome, or ALS.  It is also possible that as with any injection, there may be an allergic reaction or no effect from the medication. Reduced effectiveness after repeated injections is sometimes seen and rarely infection at the injection site may occur. All care will be taken  to prevent these side effects. If therapy is given over a long time, atrophy and wasting in the muscle injected may occur. Occasionally the patient's become refractory to treatment because they develop antibodies to the toxin. In this event, therapy needs to be modified.  I have read the above information and consent to the administration of botulism toxin.    BOTOX PROCEDURE NOTE FOR MIGRAINE HEADACHE    Contraindications and precautions discussed with patient(above). Aseptic procedure was observed and patient tolerated procedure. Procedure performed by Dr. Georgia Dom  The condition has existed for more than 6 months, and pt does not have a diagnosis of ALS, Myasthenia Gravis or Lambert-Eaton Syndrome.  Risks and benefits of injections discussed and pt agrees to proceed with the procedure.  Written consent obtained  These injections are medically necessary. Pt  receives good benefits from these injections. These injections do not cause sedations or hallucinations which the oral therapies may cause.  Indication/Diagnosis: chronic migraine BOTOX(J0585) injection was performed according to protocol by [employer]. 200 units of BOTOX was dissolved into 4 cc NS.   NDC: [address]-98   Description of procedure:  The patient was placed in a sitting position. The standard protocol was used for Botox as follows, with 5 units of Botox injected at each site:   -Procerus muscle, midline injection  -Corrugator muscle, bilateral injection  -Frontalis muscle, bilateral injection, with 2 sites each side, medial injection was performed in the upper one third of the frontalis muscle, in the region vertical from the medial inferior edge of the superior orbital rim. The lateral injection was again in the upper one third of the  forehead vertically above the lateral limbus of the cornea, 1.5 cm lateral to the medial injection site.  -Temporalis muscle injection, 4 sites, bilaterally. The first injection was  3 cm above the tragus of the ear, second injection site was 1.5 cm to 3 cm up from the first injection site in line with the tragus of the ear. The third injection site was 1.5-3 cm forward between the first 2 injection sites. The fourth injection site was 1.5 cm posterior to the second injection site.  -Occipitalis muscle injection, 3 sites, bilaterally. The first injection was done one half way between the occipital protuberance and the tip of the mastoid process behind the ear. The second injection site was done lateral and superior to the first, 1 fingerbreadth from the first injection. The third injection site was 1 fingerbreadth superiorly and medially from the first injection site.  -Cervical paraspinal muscle injection, 2 sites, bilateral knee first injection site was 1 cm from the midline of the cervical spine, 3 cm inferior to the lower border of the occipital protuberance. The second injection site was 1.5 cm superiorly and laterally to the first injection site.  -Trapezius muscle injection was performed at 3 sites, bilaterally. The first injection site was in the upper trapezius muscle halfway between the inflection point of the neck, and the acromion. The second injection site was one half way between the acromion and the first injection site. The third injection was done between the first injection site and the inflection point of the neck.    - Levator Scapulae: 5 units bilaterally   -Temporalis muscle injection,  5th site laterally in the temporalis muscles at the level of the outer canthus.  - Patient feels her clenching is a trigger for headaches. +5 units masseter bilaterally   Will return for repeat injection in 3 months.   A 200 unit sof Botox was used, 185 units were injected, the rest of the Botox was wasted. The patient tolerated the procedure well, there were no complications of the above procedure.

## 2018-07-01 NOTE — Progress Notes (Signed)
Botox- 100 units x 2 vials Lot: [id number] Expiration: 12/2020 NDC: 6759-1638-46  Bacteriostatic 0.9% Sodium Chloride- 33mL total Lot: [id number] Expiration: 06/16/2019 NDC: 7017-7939-03  Dx: E09.233 S/P

## 2018-07-19 ENCOUNTER — Encounter: Payer: Self-pay | Admitting: Family Medicine

## 2018-07-27 ENCOUNTER — Ambulatory Visit (INDEPENDENT_AMBULATORY_CARE_PROVIDER_SITE_OTHER): Payer: Medicare Other | Admitting: Family Medicine

## 2018-07-27 ENCOUNTER — Ambulatory Visit (HOSPITAL_BASED_OUTPATIENT_CLINIC_OR_DEPARTMENT_OTHER)
Admission: RE | Admit: 2018-07-27 | Discharge: 2018-07-27 | Disposition: A | Discharge status: Home / Self Care | Payer: Medicare Other | Source: Ambulatory Visit | Attending: Family Medicine | Admitting: Family Medicine

## 2018-07-27 ENCOUNTER — Encounter: Payer: Self-pay | Admitting: Family Medicine

## 2018-07-27 VITALS — BP 126/84 | HR 67 | Temp 98.0°F | Resp 16 | Ht 65.0 in | Wt 220.0 lb

## 2018-07-27 DIAGNOSIS — E038 Other specified hypothyroidism: Secondary | ICD-10-CM

## 2018-07-27 DIAGNOSIS — M25562 Pain in left knee: Secondary | ICD-10-CM | POA: Diagnosis present

## 2018-07-27 DIAGNOSIS — E538 Deficiency of other specified B group vitamins: Secondary | ICD-10-CM | POA: Diagnosis not present

## 2018-07-27 DIAGNOSIS — E782 Mixed hyperlipidemia: Secondary | ICD-10-CM

## 2018-07-27 DIAGNOSIS — E88819 Insulin resistance, unspecified: Secondary | ICD-10-CM

## 2018-07-27 DIAGNOSIS — R790 Abnormal level of blood mineral: Secondary | ICD-10-CM

## 2018-07-27 DIAGNOSIS — Z23 Encounter for immunization: Secondary | ICD-10-CM | POA: Diagnosis not present

## 2018-07-27 DIAGNOSIS — M170 Bilateral primary osteoarthritis of knee: Secondary | ICD-10-CM

## 2018-07-27 DIAGNOSIS — M8580 Other specified disorders of bone density and structure, unspecified site: Secondary | ICD-10-CM | POA: Diagnosis not present

## 2018-07-27 DIAGNOSIS — E8881 Metabolic syndrome: Secondary | ICD-10-CM | POA: Diagnosis not present

## 2018-07-27 DIAGNOSIS — E559 Vitamin D deficiency, unspecified: Secondary | ICD-10-CM

## 2018-07-27 NOTE — Patient Instructions (Addendum)
Shingrix is the new shingles shot, 2 shots over 2-6 months at pharmacy [place] stands for "Dietary Approaches to Stop Hypertension." The DASH eating plan is a healthy eating plan that has been shown to reduce high blood pressure (hypertension). It may also reduce your risk for type 2 diabetes, heart disease, and stroke. The DASH eating plan may also help with weight loss. What are tips for following this plan? General guidelines  Avoid eating more than 2,300 mg (milligrams) of salt (sodium) a day. If you have hypertension, you may need to reduce your sodium intake to 1,500 mg a day.  Limit alcohol intake to no more than 1 drink a day for nonpregnant women and 2 drinks a day for men. One drink equals 12 oz of beer, 5 oz of wine, or 1 oz of hard liquor.  Work with your health care provider to maintain a healthy body weight or to lose weight. Ask what an ideal weight is for you.  Get at least 30 minutes of exercise that causes your heart to beat faster (aerobic exercise) most days of the week. Activities may include walking, swimming, or biking.  Work with your health care provider or diet and nutrition specialist (dietitian) to adjust your eating plan to your individual calorie needs. Reading food labels  Check food labels for the amount of sodium per serving. Choose foods with less than 5 percent of the Daily Value of sodium. Generally, foods with less than 300 mg of sodium per serving fit into this eating plan.  To find whole grains, look for the word "whole" as the first word in the ingredient list. Shopping  Buy products labeled as "low-sodium" or "no salt added."  Buy fresh foods. Avoid canned foods and premade or frozen meals. Cooking  Avoid adding salt when cooking. Use salt-free seasonings or herbs instead of table salt or sea salt. Check with your health care provider or pharmacist before using salt substitutes.  Do not fry foods. Cook foods using healthy methods  such as baking, boiling, grilling, and broiling instead.  Cook with heart-healthy oils, such as olive, canola, soybean, or sunflower oil. Meal planning   Eat a balanced diet that includes: ? 5 or more servings of fruits and vegetables each day. At each meal, try to fill half of your plate with fruits and vegetables. ? Up to 6-8 servings of whole grains each day. ? Less than 6 oz of lean meat, poultry, or fish each day. A 3-oz serving of meat is about the same size as a deck of cards. One egg equals 1 oz. ? 2 servings of low-fat dairy each day. ? A serving of nuts, seeds, or beans 5 times each week. ? Heart-healthy fats. Healthy fats called Omega-3 fatty acids are found in foods such as flaxseeds and coldwater fish, like sardines, salmon, and mackerel.  Limit how much you eat of the following: ? Canned or prepackaged foods. ? Food that is high in trans fat, such as fried foods. ? Food that is high in saturated fat, such as fatty meat. ? Sweets, desserts, sugary drinks, and other foods with added sugar. ? Full-fat dairy products.  Do not salt foods before eating.  Try to eat at least 2 vegetarian meals each week.  Eat more home-cooked food and less restaurant, buffet, and fast food.  When eating at a restaurant, ask that your food be prepared with less salt or no salt, if possible. What foods are recommended? The items  listed may not be a complete list. Talk with your dietitian about what dietary choices are best for you. Grains Whole-grain or whole-wheat bread. Whole-grain or whole-wheat pasta. Brown rice. [place]. Bulgur. Whole-grain and low-sodium cereals. Pita bread. Low-fat, low-sodium crackers. Whole-wheat flour tortillas. Vegetables Fresh or frozen vegetables (raw, steamed, roasted, or grilled). Low-sodium or reduced-sodium tomato and vegetable juice. Low-sodium or reduced-sodium tomato sauce and tomato paste. Low-sodium or reduced-sodium canned vegetables. Fruits All  fresh, dried, or frozen fruit. Canned fruit in natural juice (without added sugar). Meat and other protein foods Skinless chicken or [place]. Ground chicken or [place]. Pork with fat trimmed off. Fish and seafood. Egg whites. Dried beans, peas, or lentils. Unsalted nuts, nut butters, and seeds. Unsalted canned beans. Lean cuts of beef with fat trimmed off. Low-sodium, lean deli meat. Dairy Low-fat (1%) or fat-free (skim) milk. Fat-free, low-fat, or reduced-fat cheeses. Nonfat, low-sodium ricotta or cottage cheese. Low-fat or nonfat yogurt. Low-fat, low-sodium cheese. Fats and oils Soft margarine without trans fats. Vegetable oil. Low-fat, reduced-fat, or light mayonnaise and salad dressings (reduced-sodium). Canola, safflower, olive, soybean, and sunflower oils. Avocado. Seasoning and other foods Herbs. Spices. Seasoning mixes without salt. Unsalted popcorn and pretzels. Fat-free sweets. What foods are not recommended? The items listed may not be a complete list. Talk with your dietitian about what dietary choices are best for you. Grains Baked goods made with fat, such as croissants, muffins, or some breads. Dry pasta or rice meal packs. Vegetables Creamed or fried vegetables. Vegetables in a cheese sauce. Regular canned vegetables (not low-sodium or reduced-sodium). Regular canned tomato sauce and paste (not low-sodium or reduced-sodium). Regular tomato and vegetable juice (not low-sodium or reduced-sodium). Angie Fava. Olives. Fruits Canned fruit in a light or heavy syrup. Fried fruit. Fruit in cream or butter sauce. Meat and other protein foods Fatty cuts of meat. Ribs. Fried meat. Berniece Salines. Sausage. Bologna and other processed lunch meats. Salami. Fatback. Hotdogs. Bratwurst. Salted nuts and seeds. Canned beans with added salt. Canned or smoked fish. Whole eggs or egg yolks. Chicken or [place] with skin. Dairy Whole or 2% milk, cream, and half-and-half. Whole or full-fat cream cheese. Whole-fat or  sweetened yogurt. Full-fat cheese. Nondairy creamers. Whipped toppings. Processed cheese and cheese spreads. Fats and oils Butter. Stick margarine. Lard. Shortening. Ghee. Bacon fat. Tropical oils, such as coconut, palm kernel, or palm oil. Seasoning and other foods Salted popcorn and pretzels. Onion salt, garlic salt, seasoned salt, table salt, and sea salt. Worcestershire sauce. Tartar sauce. Barbecue sauce. Teriyaki sauce. Soy sauce, including reduced-sodium. Steak sauce. Canned and packaged gravies. Fish sauce. Oyster sauce. Cocktail sauce. Horseradish that you find on the shelf. Ketchup. Mustard. Meat flavorings and tenderizers. Bouillon cubes. Hot sauce and Tabasco sauce. Premade or packaged marinades. Premade or packaged taco seasonings. Relishes. Regular salad dressings. Where to find more information:  National Heart, Lung, and [place]: https://wilson-eaton.com/  American Heart Association: www.heart.org Summary  The DASH eating plan is a healthy eating plan that has been shown to reduce high blood pressure (hypertension). It may also reduce your risk for type 2 diabetes, heart disease, and stroke.  With the DASH eating plan, you should limit salt (sodium) intake to 2,300 mg a day. If you have hypertension, you may need to reduce your sodium intake to 1,500 mg a day.  When on the DASH eating plan, aim to eat more fresh fruits and vegetables, whole grains, lean proteins, low-fat dairy, and heart-healthy fats.  Work with your health care provider or  diet and nutrition specialist (dietitian) to adjust your eating plan to your individual calorie needs. This information is not intended to replace advice given to you by your health care provider. Make sure you discuss any questions you have with your health care provider. Document Released: 08/21/2011 Document Revised: 08/25/2016 Document Reviewed: 08/25/2016 Elsevier Interactive Patient Education  [employer].

## 2018-07-27 NOTE — Assessment & Plan Note (Signed)
Supplement and monitor 

## 2018-07-27 NOTE — Assessment & Plan Note (Signed)
Encouraged to get adequate exercise, calcium and vitamin d intake 

## 2018-07-27 NOTE — Assessment & Plan Note (Signed)
Encouraged heart healthy diet, increase exercise, avoid trans fats, consider a krill oil cap daily 

## 2018-07-27 NOTE — Assessment & Plan Note (Signed)
Check hgba1c

## 2018-07-27 NOTE — Assessment & Plan Note (Signed)
On Levothyroxine, continue to monitor 

## 2018-07-27 NOTE — Assessment & Plan Note (Signed)
>>  ASSESSMENT AND PLAN FOR HYPOTHYROIDISM WRITTEN ON 07/27/2018  5:06 PM BY BLYTH, STACEY A, MD  On Levothyroxine, continue to monitor

## 2018-07-27 NOTE — Assessment & Plan Note (Signed)
monitor

## 2018-07-28 ENCOUNTER — Encounter: Payer: Self-pay | Admitting: Family Medicine

## 2018-07-28 LAB — LIPID PANEL
Cholesterol: 197 mg/dL (ref 0–200)
HDL: 51.6 mg/dL (ref >39.00)
LDL Cholesterol: 117 mg/dL — ABNORMAL HIGH (ref 0–99)
NONHDL: 145.8
TRIGLYCERIDES: 145 mg/dL (ref 0.0–149.0)
Total CHOL/HDL Ratio: 4
VLDL: 29 mg/dL (ref 0.0–40.0)

## 2018-07-28 LAB — COMPREHENSIVE METABOLIC PANEL
ALK PHOS: 90 U/L (ref 39–117)
ALT: 14 U/L (ref 0–35)
AST: 23 U/L (ref 0–37)
Albumin: 4.3 g/dL (ref 3.5–5.2)
BILIRUBIN TOTAL: 0.4 mg/dL (ref 0.2–1.2)
BUN: 11 mg/dL (ref 6–23)
CALCIUM: 9.6 mg/dL (ref 8.4–10.5)
CO2: 29 mEq/L (ref 19–32)
CREATININE: 0.93 mg/dL (ref 0.40–1.20)
Chloride: 104 mEq/L (ref 96–112)
GFR: 63.19 mL/min (ref >60.00)
Glucose, Bld: 93 mg/dL (ref 70–99)
Potassium: 3.5 mEq/L (ref 3.5–5.1)
Sodium: 141 mEq/L (ref 135–145)
TOTAL PROTEIN: 7 g/dL (ref 6.0–8.3)

## 2018-07-28 LAB — VITAMIN B12: VITAMIN B 12: 457 pg/mL (ref 211–911)

## 2018-07-28 LAB — CBC
HCT: 32.6 % — ABNORMAL LOW (ref 36.0–46.0)
HEMOGLOBIN: 10.3 g/dL — AB (ref 12.0–15.0)
MCHC: 31.5 g/dL (ref 30.0–36.0)
MCV: 74 fl — ABNORMAL LOW (ref 78.0–100.0)
PLATELETS: 368 10*3/uL (ref 150.0–400.0)
RBC: 4.41 Mil/uL (ref 3.87–5.11)
RDW: 18 % — ABNORMAL HIGH (ref 11.5–15.5)
WBC: 5.6 10*3/uL (ref 4.0–10.5)

## 2018-07-28 LAB — TSH: TSH: 0.3 u[IU]/mL — ABNORMAL LOW (ref 0.35–4.50)

## 2018-07-28 LAB — HEMOGLOBIN A1C: HEMOGLOBIN A1C: 5.8 % (ref 4.6–6.5)

## 2018-07-28 LAB — VITAMIN D 25 HYDROXY (VIT D DEFICIENCY, FRACTURES): VITD: 31.97 ng/mL (ref 30.00–100.00)

## 2018-07-31 DIAGNOSIS — M25562 Pain in left knee: Secondary | ICD-10-CM

## 2018-07-31 HISTORY — DX: Pain in left knee: M25.562

## 2018-07-31 NOTE — Progress Notes (Signed)
Subjective:    Patient ID: [name] Nolan, female    DOB: 06-02-1946, 72 y.o.   MRN: [id number]  Chief Complaint  Patient presents with  . Weight Gain    complains of weight gain  . Knee Injury    Complains of left knee injury, she felt 11-07.     HPI Patient is in today for follow up and she took a fall 2 weeks ago. She notes pain and swelling since that time. No radicular symptoms. Denies CP/palp/SOB/HA/congestion/fevers/GI or GU c/o. Taking meds as prescribed. No polyuia o polydipsia.   Past Medical History:  Diagnosis Date  . Acute bronchitis 08/15/2015  . Anxiety   . Anxiety state 05/07/2014   Widowed in 2013 after caring for her husband with Lewy Body Dementia for 6 years   . Arrhythmia 01/25/2015   Per Dr. Jaynee Eagles, [facility]; hx PVC  . Arthritis    "knees" (11/07/2015)  . 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 Basal cell carcinoma of right ear 02/26/2015   Removed by Dr [place]  . Chronic back pain    "mid-back; stops at the very lowest part of my back" (11/07/2015)  . Colon polyp 09/03/2017  . Constipation 02/25/2016  . Depression   . Dysphagia   . Dyspnea   . Esophageal reflux    occ  . Fainting    fainted twice  . Family history of adverse reaction to anesthesia    "daughter gets bad PONV"  . Gallbladder problem   . Hashimoto's disease   . Heart murmur   . Hot flashes 05/27/2016  . Hyperlipidemia   . Hypothyroid   . Insomnia   . Joint pain   . Low ferritin 08/27/2015   "took supplements for awhile" (11/07/2015)  . Medicare annual wellness visit, subsequent 08/27/2015  . Menopause   . Migraine    "under control w/daily RX right now" (11/07/2015)  . Occipital neuralgia   . Osteopenia 02/26/2015  . Osteoporosis    osteopenia  . Preventative health care 08/27/2015  . Prolonged depressive reaction   . Swallowing difficulty   . Vitamin B12 deficiency 09/01/2016  . Vitamin D deficiency 03/02/2017    Past Surgical History:  Procedure  Laterality Date  . APPENDECTOMY  11/07/2015  . BASAL CELL CARCINOMA EXCISION Right 02/26/2015   ear  . CHOLECYSTECTOMY OPEN  1974  . COLONOSCOPY  2006  . LAPAROSCOPIC APPENDECTOMY N/A 11/07/2015   Procedure: APPENDECTOMY LAPAROSCOPIC;  Surgeon: Georganna Skeans, MD;  Location: [facility];  Service: General;  Laterality: N/A;  . LAPAROSCOPIC INCISIONAL / UMBILICAL / [place]  11/07/2015   UHR  . Tooth implant     at least 5 years ago per pt  . TUBAL LIGATION  1973  . UMBILICAL HERNIA REPAIR N/A 11/07/2015   Procedure: LAPAROSCOPIC UMBILICAL HERNIA;  Surgeon: Georganna Skeans, MD;  Location: [facility] OR;  Service: General;  Laterality: N/A;    Family History  Problem Relation Age of Onset  . Congestive Heart Failure Mother   . Hypertension Mother   . Hyperlipidemia Mother   . Heart disease Mother   . Obesity Mother   . Leukemia Father   . Obesity Father   . Kidney disease Brother   . Cancer Brother        stage 4 kidney cancer, metastatic  . Kidney cancer Brother   . Leukemia Maternal Aunt   . Congestive Heart Failure Maternal Grandmother   . Arthritis Sister   . Colon cancer  Neg Hx     Social History   Socioeconomic History  . Marital status: Married    Spouse name: [name] "[address]  . Number of children: 1  . Years of education: HS  . Highest education level: Not on file  Occupational History  . Occupation: Retired  Scientific laboratory technician  . Financial resource strain: Not on file  . Food insecurity:    Worry: Not on file    Inability: Not on file  . Transportation needs:    Medical: Not on file    Non-medical: Not on file  Tobacco Use  . Smoking status: Never Smoker  . Smokeless tobacco: Never Used  Substance and Sexual Activity  . Alcohol use: No    Alcohol/week: 0.0 standard drinks  . Drug use: No  . Sexual activity: Yes    Birth control/protection: Post-menopausal    Comment: lives with husband, no dietary restrictions, avoids caffeine, bananas, dairy  Lifestyle   . Physical activity:    Days per week: Not on file    Minutes per session: Not on file  . Stress: Not on file  Relationships  . Social connections:    Talks on phone: Not on file    Gets together: Not on file    Attends religious service: Not on file    Active member of club or organization: Not on file    Attends meetings of clubs or organizations: Not on file    Relationship status: Not on file  . Intimate partner violence:    Fear of current or ex partner: Not on file    Emotionally abused: Not on file    Physically abused: Not on file    Forced sexual activity: Not on file  Other Topics Concern  . Not on file  Social History Narrative   Patient is married [name]) and lives at home with her husband.   Patient has one child.   Patient has a high school education.   Patient is right-handed.   Caffeine Use: Occasionally    Outpatient Medications Prior to Visit  Medication Sig Dispense Refill  . BOTOX 100 units SOLR injection INJECT 155 UNITS INTO FACIAL AND NECK MUSCLES EVERY 3 MONTHS (GIVEN AT MD OFFICE, DISCARD UNUSED PORTION AFTER FIRST USE) 2 vial 3  . cyclobenzaprine (FLEXERIL) 10 MG tablet TAKE 1 TABLET BY MOUTH AT BEDTIME. CAN ALSO TAKE 1/2 -1 TABLET TWICE DAILY FOR HEADACHE 90 tablet 11  . gabapentin (NEURONTIN) 300 MG capsule TAKE ONE TO THREE TABLETS (300MG -900MG ) AT BEDTIME 270 capsule 4  . levothyroxine (SYNTHROID) 88 MCG tablet Take 1 tablet by mouth daily.    . rizatriptan (MAXALT-MLT) 10 MG disintegrating tablet Take 1 tablet (10 mg total) by mouth as needed for migraine. May repeat in 2 hours if needed 15 tablet 11  . Venlafaxine HCl 150 MG TB24 Take 1 tablet (150 mg total) by mouth daily. 90 each 4  . Vitamin D, Ergocalciferol, (DRISDOL) 50000 units CAPS capsule TAKE 1 CAPSULE (50,000 UNITS TOTAL) BY MOUTH EVERY 7 (SEVEN) DAYS. 4 capsule 1   No facility-administered medications prior to visit.     Allergies  Allergen Reactions  . Statins Other (See  Comments)    Muscle pain  . Codeine Nausea And Vomiting    Review of Systems  Constitutional: Negative for fever and malaise/fatigue.  HENT: Negative for congestion.   Eyes: Negative for blurred vision.  Respiratory: Negative for shortness of breath.   Cardiovascular: Negative for chest pain, palpitations  and leg swelling.  Gastrointestinal: Negative for abdominal pain, blood in stool and nausea.  Genitourinary: Negative for dysuria and frequency.  Musculoskeletal: Positive for joint pain. Negative for falls.  Skin: Negative for rash.  Neurological: Negative for dizziness, loss of consciousness and headaches.  Endo/Heme/Allergies: Negative for environmental allergies.  Psychiatric/Behavioral: Negative for depression. The patient is not nervous/anxious.        Objective:    Physical Exam  Constitutional: She is oriented to person, place, and time. She appears well-developed and well-nourished. No distress.  HENT:  Head: Normocephalic and atraumatic.  Nose: Nose normal.  Eyes: Right eye exhibits no discharge. Left eye exhibits no discharge.  Neck: Normal range of motion. Neck supple.  Cardiovascular: Normal rate and regular rhythm.  No murmur heard. Pulmonary/Chest: Effort normal and breath sounds normal.  Abdominal: Soft. Bowel sounds are normal. There is no tenderness.  Musculoskeletal: She exhibits edema and tenderness.  Tender, ecchymotic and swollen below left knee.   Neurological: She is alert and oriented to person, place, and time. She displays abnormal reflex.  Skin: Skin is warm and dry.  Psychiatric: She has a normal mood and affect.  Nursing note and vitals reviewed.   BP 126/84 (BP Location: Right Arm, Patient Position: Sitting, Cuff Size: Large)   Pulse 67   Temp 98 F (36.7 C) (Oral)   Resp 16   Ht 5\' 5"  (1.651 m)   Wt 220 lb (99.8 kg)   SpO2 97%   BMI 36.61 kg/m  Wt Readings from Last 3 Encounters:  07/27/18 220 lb (99.8 kg)  03/31/18 220 lb (99.8  kg)  03/25/18 226 lb 6.4 oz (102.7 kg)     Lab Results  Component Value Date   WBC 5.6 07/27/2018   HGB 10.3 (L) 07/27/2018   HCT 32.6 (L) 07/27/2018   PLT 368.0 07/27/2018   GLUCOSE 93 07/27/2018   CHOL 197 07/27/2018   TRIG 145.0 07/27/2018   HDL 51.60 07/27/2018   LDLCALC 117 (H) 07/27/2018   ALT 14 07/27/2018   AST 23 07/27/2018   NA 141 07/27/2018   K 3.5 07/27/2018   CL 104 07/27/2018   CREATININE 0.93 07/27/2018   BUN 11 07/27/2018   CO2 29 07/27/2018   TSH 0.30 (L) 07/27/2018   HGBA1C 5.8 07/27/2018    Lab Results  Component Value Date   TSH 0.30 (L) 07/27/2018   Lab Results  Component Value Date   WBC 5.6 07/27/2018   HGB 10.3 (L) 07/27/2018   HCT 32.6 (L) 07/27/2018   MCV 74.0 (L) 07/27/2018   PLT 368.0 07/27/2018   Lab Results  Component Value Date   NA 141 07/27/2018   K 3.5 07/27/2018   CO2 29 07/27/2018   GLUCOSE 93 07/27/2018   BUN 11 07/27/2018   CREATININE 0.93 07/27/2018   BILITOT 0.4 07/27/2018   ALKPHOS 90 07/27/2018   AST 23 07/27/2018   ALT 14 07/27/2018   PROT 7.0 07/27/2018   ALBUMIN 4.3 07/27/2018   CALCIUM 9.6 07/27/2018   ANIONGAP 8 11/06/2015   GFR 63.19 07/27/2018   Lab Results  Component Value Date   CHOL 197 07/27/2018   Lab Results  Component Value Date   HDL 51.60 07/27/2018   Lab Results  Component Value Date   LDLCALC 117 (H) 07/27/2018   Lab Results  Component Value Date   TRIG 145.0 07/27/2018   Lab Results  Component Value Date   CHOLHDL 4 07/27/2018   Lab Results  Component Value Date   HGBA1C 5.8 07/27/2018       Assessment & Plan:   Problem List Items Addressed This Visit    Hyperlipidemia    Encouraged heart healthy diet, increase exercise, avoid trans fats, consider a krill oil cap daily      Relevant Orders   Comprehensive metabolic panel (Completed)   Lipid panel (Completed)   Hypothyroidism    On Levothyroxine, continue to monitor      Relevant Orders   TSH (Completed)    Osteopenia    Encouraged to get adequate exercise, calcium and vitamin d intake      Low ferritin    monitor      Relevant Orders   CBC (Completed)   Vitamin B12 deficiency    Supplement and monitor      Relevant Orders   Vitamin B12 (Completed)   Arthritis of knee, degenerative    Worsening left knee pain after a fall 2 weeks ago. Encouraged ice and topical lidocaine. If pain persists may need to proceed with referral or further imaging.       Vitamin D deficiency    Supplement and monitor      Relevant Orders   VITAMIN D 25 Hydroxy (Vit-D Deficiency, Fractures) (Completed)   Insulin resistance    Check hgba1c      Relevant Orders   Hemoglobin A1c (Completed)   Acute pain of left knee   Relevant Orders   DG Knee Complete 4 Views Left (Completed)    Other Visit Diagnoses    Needs flu shot    -  Primary   Relevant Orders   Flu vaccine HIGH DOSE PF (Fluzone High dose) (Completed)   Morbid obesity ([place])       Relevant Orders   Ambulatory referral to General Surgery      I am having Mishel Stovall-Edwards maintain her rizatriptan, levothyroxine, BOTOX, Vitamin D (Ergocalciferol), cyclobenzaprine, Venlafaxine HCl, and gabapentin.  No orders of the defined types were placed in this encounter.    Penni Homans, MD

## 2018-07-31 NOTE — Assessment & Plan Note (Signed)
Worsening left knee pain after a fall 2 weeks ago. Encouraged ice and topical lidocaine. If pain persists may need to proceed with referral or further imaging.

## 2018-08-02 MED ORDER — FERROUS FUMARATE-FOLIC ACID 324-1 MG PO TABS: 1.0000 | ORAL_TABLET | Freq: Every day | ORAL | 3 refills | Status: DC

## 2018-08-02 NOTE — Addendum Note (Signed)
Addended by: Magdalene Molly A on: 08/02/2018 04:57 PM   Modules accepted: Orders

## 2018-08-05 NOTE — Telephone Encounter (Signed)
Sent records over

## 2018-09-03 NOTE — Progress Notes (Addendum)
Subjective:   [name] Nolan is a 72 y.o. female who presents for Medicare Annual (Subsequent) preventive examination.  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  Review of Systems: No ROS.  Medicare Wellness Visit. Additional risk factors are reflected in the social history. Cardiac Risk Factors include: advanced age (>55men, >63 women);dyslipidemia Sleep patterns: sleeps well taking 3 Neurontin. Home Safety/Smoke Alarms: Feels safe in home. Smoke alarms in place. Lives with husband in 1 story home. Handicap shower.   Eye- yearly at [facility].  Female:       Mammo- utd       Dexa scan- utd       CCS- due 10/2018     Objective:     Vitals: BP 136/80 (BP Location: Left Wrist, Patient Position: Sitting, Cuff Size: Normal)   Pulse 73   Ht 5\' 5"  (1.651 m)   Wt 217 lb 6.4 oz (98.6 kg)   SpO2 98%   BMI 36.18 kg/m   Body mass index is 36.18 kg/m.  Advanced Directives 09/06/2018 09/03/2017 09/01/2016 11/07/2015 11/07/2015 11/06/2015 10/26/2015  Does Patient Have a Medical Advance Directive? Yes Yes Yes - Yes Yes Yes  Type of Advance Directive [place];Living will [place];Living will [place];Living will - [place];Living will Living will;Healthcare Power of Attorney Living will;Healthcare Power of Attorney  Does patient want to make changes to medical advance directive? - No - Patient declined - No - Patient declined - No - Patient declined -  Copy of [place] in Chart? Yes - validated most recent copy scanned in chart (See row information) No - copy requested No - copy requested - Yes No - copy requested -    Tobacco Social History   Tobacco Use  Smoking Status Never Smoker  Smokeless Tobacco Never Used     Counseling given: Not Answered   Clinical Intake: Pain : No/denies pain    Past Medical History:  Diagnosis Date  . Acute bronchitis 08/15/2015    . Anxiety   . Anxiety state 05/07/2014   Widowed in 2013 after caring for her husband with Lewy Body Dementia for 6 years   . Arrhythmia 01/25/2015   Per Dr. Jaynee Eagles, [facility]; hx PVC  . Arthritis    "knees" (11/07/2015)  . 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 Basal cell carcinoma of right ear 02/26/2015   Removed by Dr [place]  . Chronic back pain    "mid-back; stops at the very lowest part of my back" (11/07/2015)  . Colon polyp 09/03/2017  . Constipation 02/25/2016  . Depression   . Dysphagia   . Dyspnea   . Esophageal reflux    occ  . Fainting    fainted twice  . Family history of adverse reaction to anesthesia    "daughter gets bad PONV"  . Gallbladder problem   . Hashimoto's disease   . Heart murmur   . Hot flashes 05/27/2016  . Hyperlipidemia   . Hypothyroid   . Insomnia   . Joint pain   . Low ferritin 08/27/2015   "took supplements for awhile" (11/07/2015)  . Medicare annual wellness visit, subsequent 08/27/2015  . Menopause   . Migraine    "under control w/daily RX right now" (11/07/2015)  . Occipital neuralgia   . Osteopenia 02/26/2015  . Osteoporosis    osteopenia  . Preventative health care 08/27/2015  . Prolonged depressive reaction   .  Swallowing difficulty   . Vitamin B12 deficiency 09/01/2016  . Vitamin D deficiency 03/02/2017   Past Surgical History:  Procedure Laterality Date  . APPENDECTOMY  11/07/2015  . BASAL CELL CARCINOMA EXCISION Right 02/26/2015   ear  . CHOLECYSTECTOMY OPEN  1974  . COLONOSCOPY  2006  . LAPAROSCOPIC APPENDECTOMY N/A 11/07/2015   Procedure: APPENDECTOMY LAPAROSCOPIC;  Surgeon: Georganna Skeans, MD;  Location: [facility];  Service: General;  Laterality: N/A;  . LAPAROSCOPIC INCISIONAL / UMBILICAL / [place]  11/07/2015   UHR  . Tooth implant     at least 5 years ago per pt  . TUBAL LIGATION  1973  . UMBILICAL HERNIA REPAIR N/A 11/07/2015   Procedure: LAPAROSCOPIC UMBILICAL HERNIA;  Surgeon: Georganna Skeans, MD;  Location: [facility] OR;  Service: General;  Laterality: N/A;   Family History  Problem Relation Age of Onset  . Congestive Heart Failure Mother   . Hypertension Mother   . Hyperlipidemia Mother   . Heart disease Mother   . Obesity Mother   . Leukemia Father   . Obesity Father   . Kidney disease Brother   . Cancer Brother        stage 4 kidney cancer, metastatic  . Kidney cancer Brother   . Leukemia Maternal Aunt   . Congestive Heart Failure Maternal Grandmother   . Arthritis Sister   . Colon cancer Neg Hx    Social History   Socioeconomic History  . Marital status: Married    Spouse name: [name] "[address]  . Number of children: 1  . Years of education: HS  . Highest education level: Not on file  Occupational History  . Occupation: Retired  Scientific laboratory technician  . Financial resource strain: Not on file  . Food insecurity:    Worry: Not on file    Inability: Not on file  . Transportation needs:    Medical: Not on file    Non-medical: Not on file  Tobacco Use  . Smoking status: Never Smoker  . Smokeless tobacco: Never Used  Substance and Sexual Activity  . Alcohol use: No    Alcohol/week: 0.0 standard drinks  . Drug use: No  . Sexual activity: Yes    Birth control/protection: Post-menopausal    Comment: lives with husband, no dietary restrictions, avoids caffeine, bananas, dairy  Lifestyle  . Physical activity:    Days per week: Not on file    Minutes per session: Not on file  . Stress: Not on file  Relationships  . Social connections:    Talks on phone: Not on file    Gets together: Not on file    Attends religious service: Not on file    Active member of club or organization: Not on file    Attends meetings of clubs or organizations: Not on file    Relationship status: Not on file  Other Topics Concern  . Not on file  Social History Narrative   Patient is married [name]) and lives at home with her husband.   Patient has one child.   Patient has a  high school education.   Patient is right-handed.   Caffeine Use: Occasionally    Outpatient Encounter Medications as of 09/06/2018  Medication Sig  . BOTOX 100 units SOLR injection INJECT 155 UNITS INTO FACIAL AND NECK MUSCLES EVERY 3 MONTHS (GIVEN AT MD OFFICE, DISCARD UNUSED PORTION AFTER FIRST USE)  . Ferrous Fumarate-Folic Acid 419-3 MG TABS Take 1 tablet by mouth daily.  Marland Kitchen  gabapentin (NEURONTIN) 300 MG capsule TAKE ONE TO THREE TABLETS (300MG -900MG ) AT BEDTIME  . levothyroxine (SYNTHROID) 88 MCG tablet Take 1 tablet by mouth daily.  . rizatriptan (MAXALT-MLT) 10 MG disintegrating tablet Take 1 tablet (10 mg total) by mouth as needed for migraine. May repeat in 2 hours if needed  . Venlafaxine HCl 150 MG TB24 Take 1 tablet (150 mg total) by mouth daily.  . cyclobenzaprine (FLEXERIL) 10 MG tablet TAKE 1 TABLET BY MOUTH AT BEDTIME. CAN ALSO TAKE 1/2 -1 TABLET TWICE DAILY FOR HEADACHE (Patient not taking: Reported on 09/06/2018)  . Vitamin D, Ergocalciferol, (DRISDOL) 50000 units CAPS capsule TAKE 1 CAPSULE (50,000 UNITS TOTAL) BY MOUTH EVERY 7 (SEVEN) DAYS. (Patient not taking: Reported on 09/06/2018)   No facility-administered encounter medications on file as of 09/06/2018.     Activities of Daily Living In your present state of health, do you have any difficulty performing the following activities: 09/06/2018  Hearing? N  Vision? N  Difficulty concentrating or making decisions? N  Walking or climbing stairs? N  Dressing or bathing? N  Doing errands, shopping? N  Preparing Food and eating ? N  Using the Toilet? N  In the past six months, have you accidently leaked urine? N  Do you have problems with loss of bowel control? N  Managing your Medications? N  Managing your Finances? N  Housekeeping or managing your Housekeeping? N  Some recent data might be hidden    Patient Care Team: Mosie Lukes, MD as PCP - General (Family Medicine) Melvenia Beam, MD as Consulting  Physician (Neurology) Delrae Rend, MD as Consulting Physician (Endocrinology) Lavonna Monarch, MD as Consulting Physician (Dermatology) Amalia Greenhouse, MD as Referring Physician (Endocrinology)    Assessment:   This is a routine wellness examination for [name] Nolan. Physical assessment deferred to PCP.  Exercise Activities and Dietary recommendations Current Exercise Habits: Home exercise routine, Type of exercise: walking, Time (Minutes): 30, Frequency (Times/Week): 7, Weekly Exercise (Minutes/Week): 210, Exercise limited by: None identified   Diet (meal preparation, eat out, water intake, caffeinated beverages, dairy products, fruits and vegetables): in general, an "unhealthy" diet. Drinks a lot of water.   Goals    . Join caregiver support group    . Lose 10 lbs by next year.   (pt-stated)       Fall Risk Fall Risk  09/06/2018 09/03/2017 09/01/2016 08/27/2015 05/29/2015  Falls in the past year? 1 No No - No  Number falls in past yr: 0 - - - -  Injury with Fall? 1 - - - -  Risk for fall due to : - - - History of fall(s) History of fall(s)    Depression Screen PHQ 2/9 Scores 09/06/2018 07/27/2018 11/02/2017 09/03/2017  PHQ - 2 Score 1 4 6  0  PHQ- 9 Score - 14 17 -  Exception Documentation - - - -     Cognitive Function Ad8 score reviewed for issues:  Issues making decisions:no  Less interest in hobbies / activities:no  Repeats questions, stories (family complaining):no  Trouble using ordinary gadgets (microwave, computer, phone):no  Forgets the month or year: no  Mismanaging finances: no  Remembering appts:no  Daily problems with thinking and/or memory:no Ad8 score is=0  MMSE - Mini Mental State Exam 09/01/2016  Orientation to time 5  Orientation to Place 5  Registration 3  Attention/ Calculation 5  Recall 3  Language- name 2 objects 2  Language- repeat 1  Language- follow 3 step command 3  Language- read & follow direction 1  Write a sentence 1  Copy  design 1  Total score 30        Immunization History  Administered Date(s) Administered  . Influenza, High Dose Seasonal PF 06/24/2017, 07/27/2018  . Influenza,inj,Quad PF,6+ Mos 05/29/2015  . Influenza-Unspecified 05/16/2014, 04/24/2016  . Pneumococcal Conjugate-13 09/04/2014  . Pneumococcal Polysaccharide-23 08/27/2015  . Tdap 08/24/2014  . Zoster 08/28/2014     Screening Tests Health Maintenance  Topic Date Due  . COLON CANCER SCREENING ANNUAL FOBT  09/17/2017  . COLONOSCOPY  10/28/2018  . MAMMOGRAM  01/14/2020  . TETANUS/TDAP  08/24/2024  . INFLUENZA VACCINE  Completed  . DEXA SCAN  Completed  . Hepatitis C Screening  Completed  . PNA vac Low Risk Adult  Completed       Plan:    Please schedule your next medicare wellness visit with me in 1 yr.  Continue to eat heart healthy diet (full of fruits, vegetables, whole grains, lean protein, water--limit salt, fat, and sugar intake) and increase physical activity as tolerated.   I have personally reviewed and noted the following in the patient's chart:   . Medical and social history . Use of alcohol, tobacco or illicit drugs  . Current medications and supplements . Functional ability and status . Nutritional status . Physical activity . Advanced directives . List of other physicians . Hospitalizations, surgeries, and ER visits in previous 12 months . Vitals . Screenings to include cognitive, depression, and falls . Referrals and appointments  In addition, I have reviewed and discussed with patient certain preventive protocols, quality metrics, and best practice recommendations. A written personalized care plan for preventive services as well as general preventive health recommendations were provided to patient.     Shela Nevin, [place]  09/06/2018  I reviewed the above Medicare wellness note by Ms. Wilfred Lacy, and agree with her documentation Denny Peon MD

## 2018-09-06 ENCOUNTER — Ambulatory Visit (INDEPENDENT_AMBULATORY_CARE_PROVIDER_SITE_OTHER): Payer: Medicare Other | Admitting: *Deleted

## 2018-09-06 ENCOUNTER — Encounter: Payer: Self-pay | Admitting: *Deleted

## 2018-09-06 VITALS — BP 136/80 | HR 73 | Ht 65.0 in | Wt 217.4 lb

## 2018-09-06 DIAGNOSIS — Z0000 Encounter for general adult medical examination without abnormal findings: Secondary | ICD-10-CM | POA: Diagnosis not present

## 2018-09-06 NOTE — Patient Instructions (Signed)
Please schedule your next medicare wellness visit with me in 1 yr.  Continue to eat heart healthy diet (full of fruits, vegetables, whole grains, lean protein, water--limit salt, fat, and sugar intake) and increase physical activity as tolerated.   [name] Nolan , Thank you for taking time to come for your Medicare Wellness Visit. I appreciate your ongoing commitment to your health goals. Please review the following plan we discussed and let me know if I can assist you in the future.   These are the goals we discussed: Goals    . Join caregiver support group    . Lose 10 lbs by next year.   (pt-stated)       This is a list of the screening recommended for you and due dates:  Health Maintenance  Topic Date Due  . Stool Blood Test  09/17/2017  . Colon Cancer Screening  10/28/2018  . Mammogram  01/14/2020  . Tetanus Vaccine  08/24/2024  . Flu Shot  Completed  . DEXA scan (bone density measurement)  Completed  .  Hepatitis C: One time screening is recommended by Center for Disease Control  (CDC) for  adults born from [birth year] through 1963.   Completed  . Pneumonia vaccines  Completed    Health Maintenance After Age 69 After age 66, you are at a higher risk for certain long-term diseases and infections as well as injuries from falls. Falls are a major cause of broken bones and head injuries in people who are older than age 18. Getting regular preventive care can help to keep you healthy and well. Preventive care includes getting regular testing and making lifestyle changes as recommended by your health care provider. Talk with your health care provider about:  Which screenings and tests you should have. A screening is a test that checks for a disease when you have no symptoms.  A diet and exercise plan that is right for you. What should I know about screenings and tests to prevent falls? Screening and testing are the best ways to find a health problem early. Early diagnosis and  treatment give you the best chance of managing medical conditions that are common after age 36. Certain conditions and lifestyle choices may make you more likely to have a fall. Your health care provider may recommend:  Regular vision checks. Poor vision and conditions such as cataracts can make you more likely to have a fall. If you wear glasses, make sure to get your prescription updated if your vision changes.  Medicine review. Work with your health care provider to regularly review all of the medicines you are taking, including over-the-counter medicines. Ask your health care provider about any side effects that may make you more likely to have a fall. Tell your health care provider if any medicines that you take make you feel dizzy or sleepy.  Osteoporosis screening. Osteoporosis is a condition that causes the bones to get weaker. This can make the bones weak and cause them to break more easily.  Blood pressure screening. Blood pressure changes and medicines to control blood pressure can make you feel dizzy.  Strength and balance checks. Your health care provider may recommend certain tests to check your strength and balance while standing, walking, or changing positions.  Foot health exam. Foot pain and numbness, as well as not wearing proper footwear, can make you more likely to have a fall.  Depression screening. You may be more likely to have a fall if you have a fear  of falling, feel emotionally low, or feel unable to do activities that you used to do.  Alcohol use screening. Using too much alcohol can affect your balance and may make you more likely to have a fall. What actions can I take to lower my risk of falls? General instructions  Talk with your health care provider about your risks for falling. Tell your health care provider if: ? You fall. Be sure to tell your health care provider about all falls, even ones that seem minor. ? You feel dizzy, sleepy, or off-balance.  Take  over-the-counter and prescription medicines only as told by your health care provider. These include any supplements.  Eat a healthy diet and maintain a healthy weight. A healthy diet includes low-fat dairy products, low-fat (lean) meats, and fiber from whole grains, beans, and lots of fruits and vegetables. Home safety  Remove any tripping hazards, such as rugs, cords, and clutter.  Install safety equipment such as grab bars in bathrooms and safety rails on stairs.  Keep rooms and walkways well-lit. Activity   Follow a regular exercise program to stay fit. This will help you maintain your balance. Ask your health care provider what types of exercise are appropriate for you.  If you need a cane or walker, use it as recommended by your health care provider.  Wear supportive shoes that have nonskid soles. Lifestyle  Do not drink alcohol if your health care provider tells you not to drink.  If you drink alcohol, limit how much you have: ? 0-1 drink a day for women. ? 0-2 drinks a day for men.  Be aware of how much alcohol is in your drink. In the [place] one drink equals one typical bottle of beer (12 oz), one-half glass of wine (5 oz), or one shot of hard liquor (1 oz).  Do not use any products that contain nicotine or tobacco, such as cigarettes and e-cigarettes. If you need help quitting, ask your health care provider. Summary  Having a healthy lifestyle and getting preventive care can help to protect your health and wellness after age 52.  Screening and testing are the best way to find a health problem early and help you avoid having a fall. Early diagnosis and treatment give you the best chance for managing medical conditions that are more common for people who are older than age 53.  Falls are a major cause of broken bones and head injuries in people who are older than age 81. Take precautions to prevent a fall at home.  Work with your health care provider to learn what changes  you can make to improve your health and wellness and to prevent falls. This information is not intended to replace advice given to you by your health care provider. Make sure you discuss any questions you have with your health care provider. Document Released: 07/15/2017 Document Revised: 07/15/2017 Document Reviewed: 07/15/2017 Elsevier Interactive Patient Education  2019 [employer].

## 2018-09-07 ENCOUNTER — Other Ambulatory Visit: Payer: Self-pay

## 2018-09-07 MED ORDER — VENLAFAXINE HCL ER 225 MG PO TB24: 1.0000 | ORAL_TABLET | Freq: Every day | ORAL | 1 refills | Status: DC

## 2018-09-07 NOTE — Telephone Encounter (Signed)
She can have an increase in her Venlafaxine to 225 mg tab, 1 tab daily, disp #30 with 1 rf and then have her come in to see [place] in next 4-6 weeks. If we are going to add a prn pill like Alprazolam will need to do UDS and discuss risks.   Previous Messages    ----- Message -----  From: Dennis Bast, RN  Sent: 09/06/2018  2:41 PM EST  To: Mosie Lukes, MD  Subject: med increase                   Pt in for awv today. Reports that you mentioned increasing her effexor or adding a "nerve pill" at her last visit. Pt states caring for her husband is really starting to cause her increased stress and would like med to help.         Spoke with patient and let her know I have d/c her 150mg  of Venalfaxine and called in the 225mg . She agreed. Patient has been scheduled for 10/28/18 to discuss medication and also possibly add Xanax. Patient has been made aware she will need to do UDS, she agreed.

## 2018-09-15 ENCOUNTER — Encounter: Payer: Self-pay | Admitting: Family Medicine

## 2018-09-17 ENCOUNTER — Encounter: Payer: Self-pay | Admitting: Family Medicine

## 2018-09-21 ENCOUNTER — Encounter: Payer: Self-pay | Admitting: Family Medicine

## 2018-09-30 ENCOUNTER — Telehealth: Payer: Self-pay | Admitting: Neurology

## 2018-09-30 NOTE — Telephone Encounter (Signed)
I called [facility] to schedule delivery. They stated it needed PA to be filled in their office. Called the PA line. They said they could not complete PA.   I called [employer] to check pre cert requirements and to see if the patient was active. The patient was active and covered, NPR, eligible for B/B. OLM#[id number]

## 2018-10-01 ENCOUNTER — Ambulatory Visit: Payer: Medicare Other | Admitting: Neurology

## 2018-10-04 ENCOUNTER — Ambulatory Visit (INDEPENDENT_AMBULATORY_CARE_PROVIDER_SITE_OTHER): Payer: Medicare Other | Admitting: Neurology

## 2018-10-04 ENCOUNTER — Telehealth: Payer: Self-pay | Admitting: Neurology

## 2018-10-04 DIAGNOSIS — G43719 Chronic migraine without aura, intractable, without status migrainosus: Secondary | ICD-10-CM

## 2018-10-04 NOTE — Progress Notes (Signed)
Botox- 100 units x 2 vials Lot: [id number] Expiration: 02/2021 NDC: 9507-2257-50  Bacteriostatic 0.9% Sodium Chloride- 78mL total Lot: [id number] Expiration: 06/16/2019 NDC: 8251-8984-21  Dx: I31.281 B/B

## 2018-10-04 NOTE — Progress Notes (Signed)
Consent Form Botulism Toxin Injection For Chronic Migraine  Interval history: Tremendous improvement, >> 50% decrease in frequency and severity. +masseters, +OO and temples and +LS.  Reviewed orally with patient, additionally signature is on file:  Botulism toxin has been approved by the Federal drug administration for treatment of chronic migraine. Botulism toxin does not cure chronic migraine and it may not be effective in some patients.  The administration of botulism toxin is accomplished by injecting a small amount of toxin into the muscles of the neck and head. Dosage must be titrated for each individual. Any benefits resulting from botulism toxin tend to wear off after 3 months with a repeat injection required if benefit is to be maintained. Injections are usually done every 3-4 months with maximum effect peak achieved by about 2 or 3 weeks. Botulism toxin is expensive and you should be sure of what costs you will incur resulting from the injection.  The side effects of botulism toxin use for chronic migraine may include:   -Transient, and usually mild, facial weakness with facial injections  -Transient, and usually mild, head or neck weakness with head/neck injections  -Reduction or loss of forehead facial animation due to forehead muscle weakness  -Eyelid drooping  -Dry eye  -Pain at the site of injection or bruising at the site of injection  -Double vision  -Potential unknown long term risks  Contraindications: You should not have Botox if you are pregnant, nursing, allergic to albumin, have an infection, skin condition, or muscle weakness at the site of the injection, or have myasthenia gravis, Lambert-Eaton syndrome, or ALS.  It is also possible that as with any injection, there may be an allergic reaction or no effect from the medication. Reduced effectiveness after repeated injections is sometimes seen and rarely infection at the injection site may occur. All care will be  taken to prevent these side effects. If therapy is given over a long time, atrophy and wasting in the muscle injected may occur. Occasionally the patient's become refractory to treatment because they develop antibodies to the toxin. In this event, therapy needs to be modified.  I have read the above information and consent to the administration of botulism toxin.    BOTOX PROCEDURE NOTE FOR MIGRAINE HEADACHE    Contraindications and precautions discussed with patient(above). Aseptic procedure was observed and patient tolerated procedure. Procedure performed by Dr. Georgia Dom  The condition has existed for more than 6 months, and pt does not have a diagnosis of ALS, Myasthenia Gravis or Lambert-Eaton Syndrome.  Risks and benefits of injections discussed and pt agrees to proceed with the procedure.  Written consent obtained  These injections are medically necessary. Pt  receives good benefits from these injections. These injections do not cause sedations or hallucinations which the oral therapies may cause.  Indication/Diagnosis: chronic migraine BOTOX(J0585) injection was performed according to protocol by [employer]. 200 units of BOTOX was dissolved into 4 cc NS.   NDC: [address]-65   Description of procedure:  The patient was placed in a sitting position. The standard protocol was used for Botox as follows, with 5 units of Botox injected at each site:   -Procerus muscle, midline injection  -Corrugator muscle, bilateral injection  -Frontalis muscle, bilateral injection, with 2 sites each side, medial injection was performed in the upper one third of the frontalis muscle, in the region vertical from the medial inferior edge of the superior orbital rim. The lateral injection was again in the upper one third of  the forehead vertically above the lateral limbus of the cornea, 1.5 cm lateral to the medial injection site.  -Temporalis muscle injection, 4 sites, bilaterally. The first  injection was 3 cm above the tragus of the ear, second injection site was 1.5 cm to 3 cm up from the first injection site in line with the tragus of the ear. The third injection site was 1.5-3 cm forward between the first 2 injection sites. The fourth injection site was 1.5 cm posterior to the second injection site.  -Occipitalis muscle injection, 3 sites, bilaterally. The first injection was done one half way between the occipital protuberance and the tip of the mastoid process behind the ear. The second injection site was done lateral and superior to the first, 1 fingerbreadth from the first injection. The third injection site was 1 fingerbreadth superiorly and medially from the first injection site.  -Cervical paraspinal muscle injection, 2 sites, bilateral knee first injection site was 1 cm from the midline of the cervical spine, 3 cm inferior to the lower border of the occipital protuberance. The second injection site was 1.5 cm superiorly and laterally to the first injection site.  -Trapezius muscle injection was performed at 3 sites, bilaterally. The first injection site was in the upper trapezius muscle halfway between the inflection point of the neck, and the acromion. The second injection site was one half way between the acromion and the first injection site. The third injection was done between the first injection site and the inflection point of the neck.    - Levator Scapulae: 5 units bilaterally   -Temporalis muscle injection,  5th site laterally in the temporalis muscles at the level of the outer canthus.  - Patient feels her clenching is a trigger for headaches. +5 units masseter bilaterally   Will return for repeat injection in 3 months.   A 200 unit sof Botox was used, 185 units were injected, the rest of the Botox was wasted. The patient tolerated the procedure well, there were no complications of the above procedure.

## 2018-10-04 NOTE — Telephone Encounter (Signed)
Botox inj

## 2018-10-05 NOTE — Telephone Encounter (Signed)
I called and scheduled the patient. DW  °

## 2018-10-05 NOTE — Telephone Encounter (Signed)
Pt returned call. Please call back at earliest convenience.

## 2018-10-05 NOTE — Telephone Encounter (Signed)
I called the patient to schedule but she did not answer so I left a VM asking her to call me back. DW  °

## 2018-10-28 ENCOUNTER — Ambulatory Visit: Payer: Medicare Other | Admitting: Family Medicine

## 2018-11-01 ENCOUNTER — Telehealth: Payer: Self-pay | Admitting: Neurology

## 2018-11-01 NOTE — Telephone Encounter (Signed)
[name] Nolan, patient called and said she received a letter that her botox expires, see below for excerpt from her email to me.   "Last question would you let [name] Nolan know that I received a letter from [place] letting me know that my BOTOX prescription expires on March 04, 2019 so I would need a new one obviously. I come back in April so good until then! Looking forward to seeing you soon!!!"

## 2018-11-01 NOTE — Telephone Encounter (Signed)
Noted  

## 2018-11-13 ENCOUNTER — Encounter: Payer: Self-pay | Admitting: Gastroenterology

## 2018-11-18 ENCOUNTER — Ambulatory Visit (INDEPENDENT_AMBULATORY_CARE_PROVIDER_SITE_OTHER): Payer: Medicare Other | Admitting: Family Medicine

## 2018-11-18 ENCOUNTER — Encounter: Payer: Self-pay | Admitting: Family Medicine

## 2018-11-18 VITALS — BP 120/78 | HR 64 | Temp 98.2°F | Resp 18 | Wt 212.4 lb

## 2018-11-18 DIAGNOSIS — D508 Other iron deficiency anemias: Secondary | ICD-10-CM | POA: Diagnosis not present

## 2018-11-18 DIAGNOSIS — E782 Mixed hyperlipidemia: Secondary | ICD-10-CM

## 2018-11-18 DIAGNOSIS — Z79899 Other long term (current) drug therapy: Secondary | ICD-10-CM

## 2018-11-18 DIAGNOSIS — E8881 Metabolic syndrome: Secondary | ICD-10-CM

## 2018-11-18 DIAGNOSIS — E038 Other specified hypothyroidism: Secondary | ICD-10-CM | POA: Diagnosis not present

## 2018-11-18 DIAGNOSIS — E538 Deficiency of other specified B group vitamins: Secondary | ICD-10-CM | POA: Diagnosis not present

## 2018-11-18 DIAGNOSIS — M170 Bilateral primary osteoarthritis of knee: Secondary | ICD-10-CM

## 2018-11-18 DIAGNOSIS — F418 Other specified anxiety disorders: Secondary | ICD-10-CM | POA: Diagnosis not present

## 2018-11-18 DIAGNOSIS — M8580 Other specified disorders of bone density and structure, unspecified site: Secondary | ICD-10-CM

## 2018-11-18 DIAGNOSIS — E88819 Insulin resistance, unspecified: Secondary | ICD-10-CM

## 2018-11-18 DIAGNOSIS — E559 Vitamin D deficiency, unspecified: Secondary | ICD-10-CM | POA: Diagnosis not present

## 2018-11-18 MED ORDER — NYSTATIN 100000 UNIT/GM EX CREA: 1.0000 "application " | TOPICAL_CREAM | Freq: Two times a day (BID) | CUTANEOUS | 2 refills | Status: DC | PRN

## 2018-11-18 MED ORDER — VENLAFAXINE HCL ER 75 MG PO CP24: 75.0000 mg | ORAL_CAPSULE | Freq: Every day | ORAL | 1 refills | Status: DC

## 2018-11-18 MED ORDER — FLUCONAZOLE 150 MG PO TABS: 150.0000 mg | ORAL_TABLET | ORAL | 1 refills | Status: DC

## 2018-11-18 MED ORDER — ALPRAZOLAM 0.25 MG PO TABS: 0.2500 mg | ORAL_TABLET | Freq: Two times a day (BID) | ORAL | 2 refills | Status: DC | PRN

## 2018-11-18 NOTE — Assessment & Plan Note (Signed)
>>  ASSESSMENT AND PLAN FOR HYPOTHYROIDISM WRITTEN ON 11/18/2018  3:10 PM BY BLYTH, STACEY A, MD  On Levothyroxine, continue to monitor

## 2018-11-18 NOTE — Assessment & Plan Note (Signed)
Increase leafy greens, consider increased lean red meat and using cast iron cookware. Continue to monitor, report any concerns 

## 2018-11-18 NOTE — Assessment & Plan Note (Signed)
Encouraged heart healthy diet, increase exercise, avoid trans fats, consider a krill oil cap daily 

## 2018-11-18 NOTE — Assessment & Plan Note (Signed)
Struggles with pain and debility in both knees. Encouraged moist heat and gentle stretching as tolerated. May try NSAIDs and prescription meds as directed and report if symptoms worsen or seek immediate care

## 2018-11-18 NOTE — Assessment & Plan Note (Signed)
Supplement and monitor 

## 2018-11-18 NOTE — Assessment & Plan Note (Signed)
Encouraged to get adequate exercise, calcium and vitamin d intake 

## 2018-11-18 NOTE — Patient Instructions (Addendum)
Clean area with [place] and then blow dry.   shingrix is the new shingles shot, 2 shots over 2-6 months at the pharmacy  Hypothyroidism  Hypothyroidism is when the thyroid gland does not make enough of certain hormones (it is underactive). The thyroid gland is a small gland located in the lower front part of the neck, just in front of the windpipe (trachea). This gland makes hormones that help control how the body uses food for energy (metabolism) as well as how the heart and brain function. These hormones also play a role in keeping your bones strong. When the thyroid is underactive, it produces too little of the hormones thyroxine (T4) and triiodothyronine (T3). What are the causes? This condition may be caused by:  Hashimoto's disease. This is a disease in which the body's disease-fighting system (immune system) attacks the thyroid gland. This is the most common cause.  Viral infections.  Pregnancy.  Certain medicines.  Birth defects.  Past radiation treatments to the head or neck for cancer.  Past treatment with radioactive iodine.  Past exposure to radiation in the environment.  Past surgical removal of part or all of the thyroid.  Problems with a gland in the center of the brain (pituitary gland).  Lack of enough iodine in the diet. What increases the risk? You are more likely to develop this condition if:  You are female.  You have a family history of thyroid conditions.  You use a medicine called lithium.  You take medicines that affect the immune system (immunosuppressants). What are the signs or symptoms? Symptoms of this condition include:  Feeling as though you have no energy (lethargy).  Not being able to tolerate cold.  Weight gain that is not explained by a change in diet or exercise habits.  Lack of appetite.  Dry skin.  Coarse hair.  Menstrual irregularity.  Slowing of thought processes.  Constipation.  Sadness or  depression. How is this diagnosed? This condition may be diagnosed based on:  Your symptoms, your medical history, and a physical exam.  Blood tests. You may also have imaging tests, such as an ultrasound or MRI. How is this treated? This condition is treated with medicine that replaces the thyroid hormones that your body does not make. After you begin treatment, it may take several weeks for symptoms to go away. Follow these instructions at home:  Take over-the-counter and prescription medicines only as told by your health care provider.  If you start taking any new medicines, tell your health care provider.  Keep all follow-up visits as told by your health care provider. This is important. ? As your condition improves, your dosage of thyroid hormone medicine may change. ? You will need to have blood tests regularly so that your health care provider can monitor your condition. Contact a health care provider if:  Your symptoms do not get better with treatment.  You are taking thyroid replacement medicine and you: ? Sweat a lot. ? Have tremors. ? Feel anxious. ? Lose weight rapidly. ? Cannot tolerate heat. ? Have emotional swings. ? Have diarrhea. ? Feel weak. Get help right away if you have:  Chest pain.  An irregular heartbeat.  A rapid heartbeat.  Difficulty breathing. Summary  Hypothyroidism is when the thyroid gland does not make enough of certain hormones (it is underactive).  When the thyroid is underactive, it produces too little of the hormones thyroxine (T4) and triiodothyronine (T3).  The most common cause is Hashimoto's disease,  a disease in which the body's disease-fighting system (immune system) attacks the thyroid gland. The condition can also be caused by viral infections, medicine, pregnancy, or past radiation treatment to the head or neck.  Symptoms may include weight gain, dry skin, constipation, feeling as though you do not have energy, and not  being able to tolerate cold.  This condition is treated with medicine to replace the thyroid hormones that your body does not make. This information is not intended to replace advice given to you by your health care provider. Make sure you discuss any questions you have with your health care provider. Document Released: 09/01/2005 Document Revised: 08/12/2017 Document Reviewed: 08/12/2017 Elsevier Interactive Patient Education  2019 [employer].

## 2018-11-18 NOTE — Assessment & Plan Note (Addendum)
Struggling with her husband's worsening Parkinson's disease and she is physically worn out and she is having [facility] getting [facility] before her health fails. One of her dogs has recently had to have surgery which has also been stressful and the second dog is blind. Drop the Venlafaxine to 75 mg due to hot flashes. Restart Alprazolam bid prn.

## 2018-11-18 NOTE — Assessment & Plan Note (Signed)
hgba1c acceptable, minimize simple carbs. Increase exercise as tolerated.  

## 2018-11-18 NOTE — Assessment & Plan Note (Signed)
On Levothyroxine, continue to monitor 

## 2018-11-18 NOTE — Progress Notes (Signed)
Subjective:    Patient ID: [name] Nolan, female    DOB: Jun 03, 1946, 73 y.o.   MRN: [id number]  No chief complaint on file.   HPI Patient is in today for evalution of ongoing stress. Struggling with her husband's worsening Parkinson's disease and she is physically worn out and she is having trouble getting [facility] before her health fails. One of her dogs has recently had to have surgery which has also been stressful and the second dog is blind. Denies CP/palp/SOB/HA/congestion/fevers/GI or GU c/o. Taking meds as prescribed  Past Medical History:  Diagnosis Date  . Acute bronchitis 08/15/2015  . Anxiety   . Anxiety state 05/07/2014   Widowed in 2013 after caring for her husband with Lewy Body Dementia for 6 years   . Arrhythmia 01/25/2015   Per Dr. Jaynee Eagles, [facility]; hx PVC  . Arthritis    "knees" (11/07/2015)  . 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 Basal cell carcinoma of right ear 02/26/2015   Removed by Dr [place]  . Chronic back pain    "mid-back; stops at the very lowest part of my back" (11/07/2015)  . Colon polyp 09/03/2017  . Constipation 02/25/2016  . Depression   . Dysphagia   . Dyspnea   . Esophageal reflux    occ  . Fainting    fainted twice  . Family history of adverse reaction to anesthesia    "daughter gets bad PONV"  . Gallbladder problem   . Hashimoto's disease   . Heart murmur   . Hot flashes 05/27/2016  . Hyperlipidemia   . Hypothyroid   . Insomnia   . Joint pain   . Low ferritin 08/27/2015   "took supplements for awhile" (11/07/2015)  . Medicare annual wellness visit, subsequent 08/27/2015  . Menopause   . Migraine    "under control w/daily RX right now" (11/07/2015)  . Occipital neuralgia   . Osteopenia 02/26/2015  . Osteoporosis    osteopenia  . Preventative health care 08/27/2015  . Prolonged depressive reaction   . Swallowing difficulty   . Vitamin B12 deficiency 09/01/2016  . Vitamin D  deficiency 03/02/2017    Past Surgical History:  Procedure Laterality Date  . APPENDECTOMY  11/07/2015  . BASAL CELL CARCINOMA EXCISION Right 02/26/2015   ear  . CHOLECYSTECTOMY OPEN  1974  . COLONOSCOPY  2006  . LAPAROSCOPIC APPENDECTOMY N/A 11/07/2015   Procedure: APPENDECTOMY LAPAROSCOPIC;  Surgeon: Georganna Skeans, MD;  Location: [facility];  Service: General;  Laterality: N/A;  . LAPAROSCOPIC INCISIONAL / UMBILICAL / [place]  11/07/2015   UHR  . Tooth implant     at least 5 years ago per pt  . TUBAL LIGATION  1973  . UMBILICAL HERNIA REPAIR N/A 11/07/2015   Procedure: LAPAROSCOPIC UMBILICAL HERNIA;  Surgeon: Georganna Skeans, MD;  Location: [facility] OR;  Service: General;  Laterality: N/A;    Family History  Problem Relation Age of Onset  . Congestive Heart Failure Mother   . Hypertension Mother   . Hyperlipidemia Mother   . Heart disease Mother   . Obesity Mother   . Leukemia Father   . Obesity Father   . Kidney disease Brother   . Cancer Brother        stage 4 kidney cancer, metastatic  . Kidney cancer Brother   . Leukemia Maternal Aunt   . Congestive Heart Failure Maternal Grandmother   . Arthritis Sister   . Colon  cancer Neg Hx     Social History   Socioeconomic History  . Marital status: Married    Spouse name: [name] "[address]  . Number of children: 1  . Years of education: HS  . Highest education level: Not on file  Occupational History  . Occupation: Retired  Scientific laboratory technician  . Financial resource strain: Not on file  . Food insecurity:    Worry: Not on file    Inability: Not on file  . Transportation needs:    Medical: Not on file    Non-medical: Not on file  Tobacco Use  . Smoking status: Never Smoker  . Smokeless tobacco: Never Used  Substance and Sexual Activity  . Alcohol use: No    Alcohol/week: 0.0 standard drinks  . Drug use: No  . Sexual activity: Yes    Birth control/protection: Post-menopausal    Comment: lives with husband, no  dietary restrictions, avoids caffeine, bananas, dairy  Lifestyle  . Physical activity:    Days per week: Not on file    Minutes per session: Not on file  . Stress: Not on file  Relationships  . Social connections:    Talks on phone: Not on file    Gets together: Not on file    Attends religious service: Not on file    Active member of club or organization: Not on file    Attends meetings of clubs or organizations: Not on file    Relationship status: Not on file  . Intimate partner violence:    Fear of current or ex partner: Not on file    Emotionally abused: Not on file    Physically abused: Not on file    Forced sexual activity: Not on file  Other Topics Concern  . Not on file  Social History Narrative   Patient is married [name]) and lives at home with her husband.   Patient has one child.   Patient has a high school education.   Patient is right-handed.   Caffeine Use: Occasionally    Outpatient Medications Prior to Visit  Medication Sig Dispense Refill  . BOTOX 100 units SOLR injection INJECT 155 UNITS INTO FACIAL AND NECK MUSCLES EVERY 3 MONTHS (GIVEN AT MD OFFICE, DISCARD UNUSED PORTION AFTER FIRST USE) 2 vial 3  . Ferrous Fumarate-Folic Acid 277-8 MG TABS Take 1 tablet by mouth daily. 30 each 3  . gabapentin (NEURONTIN) 300 MG capsule TAKE ONE TO THREE TABLETS (300MG -900MG ) AT BEDTIME 270 capsule 4  . levothyroxine (SYNTHROID) 88 MCG tablet Take 1 tablet by mouth daily.    . rizatriptan (MAXALT-MLT) 10 MG disintegrating tablet Take 1 tablet (10 mg total) by mouth as needed for migraine. May repeat in 2 hours if needed 15 tablet 11  . Venlafaxine HCl 225 MG TB24 Take 1 tablet (225 mg total) by mouth daily. 30 each 1   No facility-administered medications prior to visit.     Allergies  Allergen Reactions  . Statins Other (See Comments)    Muscle pain  . Codeine Nausea And Vomiting    Review of Systems  Constitutional: Negative for fever and malaise/fatigue.    HENT: Negative for congestion.   Eyes: Negative for blurred vision.  Respiratory: Negative for shortness of breath.   Cardiovascular: Negative for chest pain, palpitations and leg swelling.  Gastrointestinal: Negative for abdominal pain, blood in stool and nausea.  Genitourinary: Negative for dysuria and frequency.  Musculoskeletal: Negative for falls.  Skin: Negative for rash.  Neurological:  Negative for dizziness, loss of consciousness and headaches.  Endo/Heme/Allergies: Negative for environmental allergies.  Psychiatric/Behavioral: Negative for depression. The patient is not nervous/anxious.        Objective:    Physical Exam Vitals signs and nursing note reviewed.  Constitutional:      General: She is not in acute distress.    Appearance: She is well-developed.  HENT:     Head: Normocephalic and atraumatic.     Nose: Nose normal.  Eyes:     General:        Right eye: No discharge.        Left eye: No discharge.  Neck:     Musculoskeletal: Normal range of motion and neck supple.  Cardiovascular:     Rate and Rhythm: Normal rate and regular rhythm.     Heart sounds: No murmur.  Pulmonary:     Effort: Pulmonary effort is normal.     Breath sounds: Normal breath sounds.  Abdominal:     General: Bowel sounds are normal.     Palpations: Abdomen is soft.     Tenderness: There is no abdominal tenderness.  Skin:    General: Skin is warm and dry.  Neurological:     Mental Status: She is alert and oriented to person, place, and time.     BP 120/78 (BP Location: Left Arm, Patient Position: Sitting, Cuff Size: Normal)   Pulse 64   Temp 98.2 F (36.8 C) (Oral)   Resp 18   Wt 212 lb 6.4 oz (96.3 kg)   SpO2 97%   BMI 35.35 kg/m  Wt Readings from Last 3 Encounters:  11/18/18 212 lb 6.4 oz (96.3 kg)  09/06/18 217 lb 6.4 oz (98.6 kg)  07/27/18 220 lb (99.8 kg)     Lab Results  Component Value Date   WBC 5.8 11/18/2018   HGB 13.7 11/18/2018   HCT 42.0 11/18/2018    PLT 271.0 11/18/2018   GLUCOSE 75 11/18/2018   CHOL 205 (H) 11/18/2018   TRIG 151.0 (H) 11/18/2018   HDL 51.20 11/18/2018   LDLCALC 124 (H) 11/18/2018   ALT 17 11/18/2018   AST 25 11/18/2018   NA 141 11/18/2018   K 4.2 11/18/2018   CL 104 11/18/2018   CREATININE 0.82 11/18/2018   BUN 17 11/18/2018   CO2 30 11/18/2018   TSH 0.13 (L) 11/18/2018   HGBA1C 5.4 11/18/2018    Lab Results  Component Value Date   TSH 0.13 (L) 11/18/2018   Lab Results  Component Value Date   WBC 5.8 11/18/2018   HGB 13.7 11/18/2018   HCT 42.0 11/18/2018   MCV 84.8 11/18/2018   PLT 271.0 11/18/2018   Lab Results  Component Value Date   NA 141 11/18/2018   K 4.2 11/18/2018   CO2 30 11/18/2018   GLUCOSE 75 11/18/2018   BUN 17 11/18/2018   CREATININE 0.82 11/18/2018   BILITOT 0.5 11/18/2018   ALKPHOS 104 11/18/2018   AST 25 11/18/2018   ALT 17 11/18/2018   PROT 6.9 11/18/2018   ALBUMIN 4.4 11/18/2018   CALCIUM 10.1 11/18/2018   ANIONGAP 8 11/06/2015   GFR 68.69 11/18/2018   Lab Results  Component Value Date   CHOL 205 (H) 11/18/2018   Lab Results  Component Value Date   HDL 51.20 11/18/2018   Lab Results  Component Value Date   LDLCALC 124 (H) 11/18/2018   Lab Results  Component Value Date   TRIG 151.0 (H) 11/18/2018  Lab Results  Component Value Date   CHOLHDL 4 11/18/2018   Lab Results  Component Value Date   HGBA1C 5.4 11/18/2018       Assessment & Plan:   Problem List Items Addressed This Visit    Hyperlipidemia    Encouraged heart healthy diet, increase exercise, avoid trans fats, consider a krill oil cap daily      Relevant Orders   Comprehensive metabolic panel (Completed)   Lipid panel (Completed)   Hypothyroidism    On Levothyroxine, continue to monitor      Relevant Orders   TSH (Completed)   Osteopenia    Encouraged to get adequate exercise, calcium and vitamin d intake      Depression with anxiety    Struggling with her husband's  worsening Parkinson's disease and she is physically worn out and she is having trouble getting [facility] before her health fails. One of her dogs has recently had to have surgery which has also been stressful and the second dog is blind. Drop the Venlafaxine to 75 mg due to hot flashes. Restart Alprazolam bid prn.       Relevant Medications   venlafaxine XR (EFFEXOR XR) 75 MG 24 hr capsule   ALPRAZolam (XANAX) 0.25 MG tablet   Vitamin B12 deficiency    Supplement and monitor      Relevant Orders   Vitamin B12 (Completed)   Arthritis of knee, degenerative    Struggles with pain and debility in both knees. Encouraged moist heat and gentle stretching as tolerated. May try NSAIDs and prescription meds as directed and report if symptoms worsen or seek immediate care      Vitamin D deficiency    Supplement and monitor      Relevant Orders   VITAMIN D 25 Hydroxy (Vit-D Deficiency, Fractures) (Completed)   Anemia    Increase leafy greens, consider increased lean red meat and using cast iron cookware. Continue to monitor, report any concerns      Relevant Orders   CBC (Completed)   RESOLVED: B12 nutritional deficiency    Supplement and monitor      Insulin resistance    hgba1c acceptable, minimize simple carbs. Increase exercise as tolerated.      Relevant Orders   Hemoglobin A1c (Completed)   Comprehensive metabolic panel (Completed)    Other Visit Diagnoses    High risk medication use    -  Primary   Relevant Orders   Pain Mgmt, Profile 8 w/Conf, U (Completed)      I have discontinued Yessenia Stovall-Edwards's Venlafaxine HCl. I am also having her start on venlafaxine XR, ALPRAZolam, nystatin cream, and fluconazole. Additionally, I am having her maintain her rizatriptan, levothyroxine, Botox, gabapentin, and Ferrous Fumarate-Folic Acid.  Meds ordered this encounter  Medications  . venlafaxine XR (EFFEXOR XR) 75 MG 24 hr capsule    Sig: Take 1  capsule (75 mg total) by mouth daily with breakfast.    Dispense:  90 capsule    Refill:  1  . ALPRAZolam (XANAX) 0.25 MG tablet    Sig: Take 1 tablet (0.25 mg total) by mouth 2 (two) times daily as needed for anxiety.    Dispense:  60 tablet    Refill:  2  . nystatin cream (MYCOSTATIN)    Sig: Apply 1 application topically 2 (two) times daily as needed for dry skin.    Dispense:  30 g    Refill:  2  .  fluconazole (DIFLUCAN) 150 MG tablet    Sig: Take 1 tablet (150 mg total) by mouth once a week.    Dispense:  2 tablet    Refill:  1      Penni Homans, MD

## 2018-11-19 LAB — LIPID PANEL
CHOLESTEROL: 205 mg/dL — AB (ref 0–200)
HDL: 51.2 mg/dL (ref >39.00)
LDL Cholesterol: 124 mg/dL — ABNORMAL HIGH (ref 0–99)
NONHDL: 153.95
Total CHOL/HDL Ratio: 4
Triglycerides: 151 mg/dL — ABNORMAL HIGH (ref 0.0–149.0)
VLDL: 30.2 mg/dL (ref 0.0–40.0)

## 2018-11-19 LAB — COMPREHENSIVE METABOLIC PANEL
ALBUMIN: 4.4 g/dL (ref 3.5–5.2)
ALK PHOS: 104 U/L (ref 39–117)
ALT: 17 U/L (ref 0–35)
AST: 25 U/L (ref 0–37)
BUN: 17 mg/dL (ref 6–23)
CO2: 30 mEq/L (ref 19–32)
Calcium: 10.1 mg/dL (ref 8.4–10.5)
Chloride: 104 mEq/L (ref 96–112)
Creatinine, Ser: 0.82 mg/dL (ref 0.40–1.20)
GFR: 68.69 mL/min (ref >60.00)
GLUCOSE: 75 mg/dL (ref 70–99)
Potassium: 4.2 mEq/L (ref 3.5–5.1)
SODIUM: 141 meq/L (ref 135–145)
TOTAL PROTEIN: 6.9 g/dL (ref 6.0–8.3)
Total Bilirubin: 0.5 mg/dL (ref 0.2–1.2)

## 2018-11-19 LAB — PAIN MGMT, PROFILE 8 W/CONF, U
6 Acetylmorphine: NEGATIVE ng/mL (ref <10)
Alcohol Metabolites: NEGATIVE ng/mL (ref <500)
Amphetamines: NEGATIVE ng/mL (ref <500)
BENZODIAZEPINES: NEGATIVE ng/mL (ref <100)
BUPRENORPHINE, URINE: NEGATIVE ng/mL (ref <5)
CREATININE: 98.2 mg/dL
Cocaine Metabolite: NEGATIVE ng/mL (ref <150)
MARIJUANA METABOLITE: NEGATIVE ng/mL (ref <20)
MDMA: NEGATIVE ng/mL (ref <500)
Opiates: NEGATIVE ng/mL (ref <100)
Oxidant: NEGATIVE ug/mL (ref <200)
Oxycodone: NEGATIVE ng/mL (ref <100)
PH: 5.77 (ref 4.5–9.0)

## 2018-11-19 LAB — CBC
HCT: 42 % (ref 36.0–46.0)
HEMOGLOBIN: 13.7 g/dL (ref 12.0–15.0)
MCHC: 32.7 g/dL (ref 30.0–36.0)
MCV: 84.8 fl (ref 78.0–100.0)
Platelets: 271 10*3/uL (ref 150.0–400.0)
RBC: 4.95 Mil/uL (ref 3.87–5.11)
RDW: 18.8 % — AB (ref 11.5–15.5)
WBC: 5.8 10*3/uL (ref 4.0–10.5)

## 2018-11-19 LAB — TSH: TSH: 0.13 u[IU]/mL — AB (ref 0.35–4.50)

## 2018-11-19 LAB — VITAMIN D 25 HYDROXY (VIT D DEFICIENCY, FRACTURES): VITD: 29.91 ng/mL — AB (ref 30.00–100.00)

## 2018-11-19 LAB — HEMOGLOBIN A1C: HEMOGLOBIN A1C: 5.4 % (ref 4.6–6.5)

## 2018-11-19 LAB — VITAMIN B12: Vitamin B-12: 307 pg/mL (ref 211–911)

## 2018-11-30 ENCOUNTER — Other Ambulatory Visit (HOSPITAL_BASED_OUTPATIENT_CLINIC_OR_DEPARTMENT_OTHER): Payer: Self-pay | Admitting: Family Medicine

## 2018-11-30 DIAGNOSIS — Z1231 Encounter for screening mammogram for malignant neoplasm of breast: Secondary | ICD-10-CM

## 2018-12-02 ENCOUNTER — Ambulatory Visit: Payer: Medicare Other | Admitting: Family Medicine

## 2018-12-20 ENCOUNTER — Other Ambulatory Visit: Payer: Self-pay | Admitting: Neurology

## 2018-12-20 MED ORDER — RIZATRIPTAN BENZOATE 10 MG PO TBDP: 10.0000 mg | ORAL_TABLET | ORAL | 11 refills | Status: DC | PRN

## 2018-12-28 ENCOUNTER — Other Ambulatory Visit: Payer: Self-pay

## 2018-12-28 ENCOUNTER — Ambulatory Visit (INDEPENDENT_AMBULATORY_CARE_PROVIDER_SITE_OTHER): Payer: Medicare Other | Admitting: Neurology

## 2018-12-28 VITALS — Temp 98.2°F

## 2018-12-28 DIAGNOSIS — G43719 Chronic migraine without aura, intractable, without status migrainosus: Secondary | ICD-10-CM | POA: Diagnosis not present

## 2018-12-28 NOTE — Progress Notes (Signed)
Consent Form Botulism Toxin Injection For Chronic Migraine  Interval history 12/28/2018: Tremendous improvement, >> 50% decrease in frequency and severity. +masseters, +OO and temples and +LS. Recent stress and COVID19 crisis and sick husband at home.   Reviewed orally with patient, additionally signature is on file:  Botulism toxin has been approved by the Federal drug administration for treatment of chronic migraine. Botulism toxin does not cure chronic migraine and it may not be effective in some patients.  The administration of botulism toxin is accomplished by injecting a small amount of toxin into the muscles of the neck and head. Dosage must be titrated for each individual. Any benefits resulting from botulism toxin tend to wear off after 3 months with a repeat injection required if benefit is to be maintained. Injections are usually done every 3-4 months with maximum effect peak achieved by about 2 or 3 weeks. Botulism toxin is expensive and you should be sure of what costs you will incur resulting from the injection.  The side effects of botulism toxin use for chronic migraine may include:   -Transient, and usually mild, facial weakness with facial injections  -Transient, and usually mild, head or neck weakness with head/neck injections  -Reduction or loss of forehead facial animation due to forehead muscle weakness  -Eyelid drooping  -Dry eye  -Pain at the site of injection or bruising at the site of injection  -Double vision  -Potential unknown long term risks  Contraindications: You should not have Botox if you are pregnant, nursing, allergic to albumin, have an infection, skin condition, or muscle weakness at the site of the injection, or have myasthenia gravis, Lambert-Eaton syndrome, or ALS.  It is also possible that as with any injection, there may be an allergic reaction or no effect from the medication. Reduced effectiveness after repeated injections is sometimes seen and  rarely infection at the injection site may occur. All care will be taken to prevent these side effects. If therapy is given over a long time, atrophy and wasting in the muscle injected may occur. Occasionally the patient's become refractory to treatment because they develop antibodies to the toxin. In this event, therapy needs to be modified.  I have read the above information and consent to the administration of botulism toxin.    BOTOX PROCEDURE NOTE FOR MIGRAINE HEADACHE    Contraindications and precautions discussed with patient(above). Aseptic procedure was observed and patient tolerated procedure. Procedure performed by Dr. Georgia Dom  The condition has existed for more than 6 months, and pt does not have a diagnosis of ALS, Myasthenia Gravis or Lambert-Eaton Syndrome.  Risks and benefits of injections discussed and pt agrees to proceed with the procedure.  Written consent obtained  These injections are medically necessary. Pt  receives good benefits from these injections. These injections do not cause sedations or hallucinations which the oral therapies may cause.  Indication/Diagnosis: chronic migraine BOTOX(J0585) injection was performed according to protocol by [employer]. 200 units of BOTOX was dissolved into 4 cc NS.   NDC: [address]-32   Description of procedure:  The patient was placed in a sitting position. The standard protocol was used for Botox as follows, with 5 units of Botox injected at each site:   -Procerus muscle, midline injection  -Corrugator muscle, bilateral injection  -Frontalis muscle, bilateral injection, with 2 sites each side, medial injection was performed in the upper one third of the frontalis muscle, in the region vertical from the medial inferior edge of the superior orbital  rim. The lateral injection was again in the upper one third of the forehead vertically above the lateral limbus of the cornea, 1.5 cm lateral to the medial injection  site.  -Temporalis muscle injection, 4 sites, bilaterally. The first injection was 3 cm above the tragus of the ear, second injection site was 1.5 cm to 3 cm up from the first injection site in line with the tragus of the ear. The third injection site was 1.5-3 cm forward between the first 2 injection sites. The fourth injection site was 1.5 cm posterior to the second injection site.  -Occipitalis muscle injection, 3 sites, bilaterally. The first injection was done one half way between the occipital protuberance and the tip of the mastoid process behind the ear. The second injection site was done lateral and superior to the first, 1 fingerbreadth from the first injection. The third injection site was 1 fingerbreadth superiorly and medially from the first injection site.  -Cervical paraspinal muscle injection, 2 sites, bilateral knee first injection site was 1 cm from the midline of the cervical spine, 3 cm inferior to the lower border of the occipital protuberance. The second injection site was 1.5 cm superiorly and laterally to the first injection site.  -Trapezius muscle injection was performed at 3 sites, bilaterally. The first injection site was in the upper trapezius muscle halfway between the inflection point of the neck, and the acromion. The second injection site was one half way between the acromion and the first injection site. The third injection was done between the first injection site and the inflection point of the neck.    - Levator Scapulae: 5 units bilaterally   -Temporalis muscle injection,  5th site laterally in the temporalis muscles at the level of the outer canthus.  - Patient feels her clenching is a trigger for headaches. +5 units masseter bilaterally   Will return for repeat injection in 3 months.   A 200 unit sof Botox was used, 185 units were injected, the rest of the Botox was wasted. The patient tolerated the procedure well, there were no complications of the above  procedure.

## 2018-12-28 NOTE — Progress Notes (Signed)
Botox- 100 units x 2 vials Lot: [id number] Expiration: 05/2021 NDC: 0349-6116-43  Bacteriostatic 0.9% Sodium Chloride- 62mL total Lot: [id number] Expiration: 06/16/2019 NDC: 5834-6219-47  Dx: X25.271 B/B

## 2019-01-06 ENCOUNTER — Ambulatory Visit: Payer: Medicare Other | Admitting: Neurology

## 2019-01-15 ENCOUNTER — Ambulatory Visit (HOSPITAL_BASED_OUTPATIENT_CLINIC_OR_DEPARTMENT_OTHER): Payer: Medicare Other

## 2019-02-22 ENCOUNTER — Other Ambulatory Visit: Payer: Self-pay

## 2019-02-22 ENCOUNTER — Ambulatory Visit (INDEPENDENT_AMBULATORY_CARE_PROVIDER_SITE_OTHER): Payer: Medicare Other | Admitting: Family Medicine

## 2019-02-22 DIAGNOSIS — F418 Other specified anxiety disorders: Secondary | ICD-10-CM

## 2019-02-22 DIAGNOSIS — E559 Vitamin D deficiency, unspecified: Secondary | ICD-10-CM | POA: Diagnosis not present

## 2019-02-22 DIAGNOSIS — E538 Deficiency of other specified B group vitamins: Secondary | ICD-10-CM

## 2019-02-22 DIAGNOSIS — R790 Abnormal level of blood mineral: Secondary | ICD-10-CM

## 2019-02-22 DIAGNOSIS — G43709 Chronic migraine without aura, not intractable, without status migrainosus: Secondary | ICD-10-CM

## 2019-02-22 NOTE — Progress Notes (Signed)
Virtual Visit via Telephone Note  I connected with [name] Nolan on 02/22/19 at  3:20 PM EDT by a phone enabled telemedicine application and verified that I am speaking with the correct person using two identifiers.  Location: Patient: home Provider: home   I discussed the limitations of evaluation and management by telemedicine and the availability of in person appointments. The patient expressed understanding and agreed to proceed. [name] Nolan, CMA was able to get the patient set up on phone visit after she was unable to proceed with video visit    Subjective:    Patient ID: [name] Nolan, female    DOB: Sep 06, 1946, 73 y.o.   MRN: [id number]  No chief complaint on file.   HPI Patient is in today for follow up on chronic medical concerns including hyperlipidemia, hypothyroidism, depression and more. She feels well and denies any acute concerns. No recent febrile illness or hospitalizations. Is maintaining quarantine well. Denies CP/palp/SOB/HA/congestion/fevers/GI or GU c/o. Taking meds as prescribed  Past Medical History:  Diagnosis Date  . Acute bronchitis 08/15/2015  . Anxiety   . Anxiety state 05/07/2014   Widowed in 2013 after caring for her husband with Lewy Body Dementia for 6 years   . Arrhythmia 01/25/2015   Per Dr. Jaynee Eagles, [facility]; hx PVC  . Arthritis    "knees" (11/07/2015)  . 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 Basal cell carcinoma of right ear 02/26/2015   Removed by Dr [place]  . Chronic back pain    "mid-back; stops at the very lowest part of my back" (11/07/2015)  . Colon polyp 09/03/2017  . Constipation 02/25/2016  . Depression   . Dysphagia   . Dyspnea   . Esophageal reflux    occ  . Fainting    fainted twice  . Family history of adverse reaction to anesthesia    "daughter gets bad PONV"  . Gallbladder problem   . Hashimoto's disease   . Heart murmur   . Hot flashes 05/27/2016  . Hyperlipidemia   . Hypothyroid    . Insomnia   . Joint pain   . Low ferritin 08/27/2015   "took supplements for awhile" (11/07/2015)  . Medicare annual wellness visit, subsequent 08/27/2015  . Menopause   . Migraine    "under control w/daily RX right now" (11/07/2015)  . Occipital neuralgia   . Osteopenia 02/26/2015  . Osteoporosis    osteopenia  . Preventative health care 08/27/2015  . Prolonged depressive reaction   . Swallowing difficulty   . Vitamin B12 deficiency 09/01/2016  . Vitamin D deficiency 03/02/2017    Past Surgical History:  Procedure Laterality Date  . APPENDECTOMY  11/07/2015  . BASAL CELL CARCINOMA EXCISION Right 02/26/2015   ear  . CHOLECYSTECTOMY OPEN  1974  . COLONOSCOPY  2006  . LAPAROSCOPIC APPENDECTOMY N/A 11/07/2015   Procedure: APPENDECTOMY LAPAROSCOPIC;  Surgeon: Georganna Skeans, MD;  Location: [facility];  Service: General;  Laterality: N/A;  . LAPAROSCOPIC INCISIONAL / UMBILICAL / [place]  11/07/2015   UHR  . Tooth implant     at least 5 years ago per pt  . TUBAL LIGATION  1973  . UMBILICAL HERNIA REPAIR N/A 11/07/2015   Procedure: LAPAROSCOPIC UMBILICAL HERNIA;  Surgeon: Georganna Skeans, MD;  Location: [facility] OR;  Service: General;  Laterality: N/A;    Family History  Problem Relation Age of Onset  . Congestive Heart Failure Mother   . Hypertension Mother   . Hyperlipidemia Mother   .  Heart disease Mother   . Obesity Mother   . Leukemia Father   . Obesity Father   . Kidney disease Brother   . Cancer Brother        stage 4 kidney cancer, metastatic  . Kidney cancer Brother   . Leukemia Maternal Aunt   . Congestive Heart Failure Maternal Grandmother   . Arthritis Sister   . Colon cancer Neg Hx     Social History   Socioeconomic History  . Marital status: Married    Spouse name: [name] Nolan "[name] Nolan  . Number of children: 1  . Years of education: HS  . Highest education level: Not on file  Occupational History  . Occupation: Retired  Scientific laboratory technician  . Financial  resource strain: Not on file  . Food insecurity    Worry: Not on file    Inability: Not on file  . Transportation needs    Medical: Not on file    Non-medical: Not on file  Tobacco Use  . Smoking status: Never Smoker  . Smokeless tobacco: Never Used  Substance and Sexual Activity  . Alcohol use: No    Alcohol/week: 0.0 standard drinks  . Drug use: No  . Sexual activity: Yes    Birth control/protection: Post-menopausal    Comment: lives with husband, no dietary restrictions, avoids caffeine, bananas, dairy  Lifestyle  . Physical activity    Days per week: Not on file    Minutes per session: Not on file  . Stress: Not on file  Relationships  . Social Herbalist on phone: Not on file    Gets together: Not on file    Attends religious service: Not on file    Active member of club or organization: Not on file    Attends meetings of clubs or organizations: Not on file    Relationship status: Not on file  . Intimate partner violence    Fear of current or ex partner: Not on file    Emotionally abused: Not on file    Physically abused: Not on file    Forced sexual activity: Not on file  Other Topics Concern  . Not on file  Social History Narrative   Patient is married [name] Nolan) and lives at home with her husband.   Patient has one child.   Patient has a high school education.   Patient is right-handed.   Caffeine Use: Occasionally    Outpatient Medications Prior to Visit  Medication Sig Dispense Refill  . ALPRAZolam (XANAX) 0.25 MG tablet Take 1 tablet (0.25 mg total) by mouth 2 (two) times daily as needed for anxiety. 60 tablet 2  . BOTOX 100 units SOLR injection INJECT 155 UNITS INTO FACIAL AND NECK MUSCLES EVERY 3 MONTHS (GIVEN AT MD OFFICE, DISCARD UNUSED PORTION AFTER FIRST USE) 2 vial 3  . Ferrous Fumarate-Folic Acid 811-9 MG TABS Take 1 tablet by mouth daily. 30 each 3  . gabapentin (NEURONTIN) 300 MG capsule TAKE ONE TO THREE TABLETS (300MG -900MG ) AT BEDTIME  270 capsule 4  . levothyroxine (SYNTHROID) 88 MCG tablet Take 1 tablet by mouth daily.    Marland Kitchen nystatin cream (MYCOSTATIN) Apply 1 application topically 2 (two) times daily as needed for dry skin. 30 g 2  . rizatriptan (MAXALT-MLT) 10 MG disintegrating tablet Take 1 tablet (10 mg total) by mouth as needed for migraine. May repeat in 2 hours if needed 15 tablet 11  . venlafaxine XR (EFFEXOR XR) 75 MG 24 hr  capsule Take 1 capsule (75 mg total) by mouth daily with breakfast. 90 capsule 1  . fluconazole (DIFLUCAN) 150 MG tablet Take 1 tablet (150 mg total) by mouth once a week. 2 tablet 1   No facility-administered medications prior to visit.     Allergies  Allergen Reactions  . Statins Other (See Comments)    Muscle pain  . Codeine Nausea And Vomiting    Review of Systems  Constitutional: Negative for fever and malaise/fatigue.  HENT: Negative for congestion.   Eyes: Negative for blurred vision.  Respiratory: Negative for shortness of breath.   Cardiovascular: Negative for chest pain, palpitations and leg swelling.  Gastrointestinal: Negative for abdominal pain, blood in stool and nausea.  Genitourinary: Negative for dysuria and frequency.  Musculoskeletal: Negative for falls.  Skin: Negative for rash.  Neurological: Negative for dizziness, loss of consciousness and headaches.  Endo/Heme/Allergies: Negative for environmental allergies.  Psychiatric/Behavioral: Negative for depression. The patient is not nervous/anxious.        Objective:    Physical Exam unable to obtain via phone visit  There were no vitals taken for this visit. Wt Readings from Last 3 Encounters:  11/18/18 212 lb 6.4 oz (96.3 kg)  09/06/18 217 lb 6.4 oz (98.6 kg)  07/27/18 220 lb (99.8 kg)    Diabetic Foot Exam - Simple   No data filed     Lab Results  Component Value Date   WBC 5.8 11/18/2018   HGB 13.7 11/18/2018   HCT 42.0 11/18/2018   PLT 271.0 11/18/2018   GLUCOSE 75 11/18/2018   CHOL 205 (H)  11/18/2018   TRIG 151.0 (H) 11/18/2018   HDL 51.20 11/18/2018   LDLCALC 124 (H) 11/18/2018   ALT 17 11/18/2018   AST 25 11/18/2018   NA 141 11/18/2018   K 4.2 11/18/2018   CL 104 11/18/2018   CREATININE 0.82 11/18/2018   BUN 17 11/18/2018   CO2 30 11/18/2018   TSH 0.13 (L) 11/18/2018   HGBA1C 5.4 11/18/2018    Lab Results  Component Value Date   TSH 0.13 (L) 11/18/2018   Lab Results  Component Value Date   WBC 5.8 11/18/2018   HGB 13.7 11/18/2018   HCT 42.0 11/18/2018   MCV 84.8 11/18/2018   PLT 271.0 11/18/2018   Lab Results  Component Value Date   NA 141 11/18/2018   K 4.2 11/18/2018   CO2 30 11/18/2018   GLUCOSE 75 11/18/2018   BUN 17 11/18/2018   CREATININE 0.82 11/18/2018   BILITOT 0.5 11/18/2018   ALKPHOS 104 11/18/2018   AST 25 11/18/2018   ALT 17 11/18/2018   PROT 6.9 11/18/2018   ALBUMIN 4.4 11/18/2018   CALCIUM 10.1 11/18/2018   ANIONGAP 8 11/06/2015   GFR 68.69 11/18/2018   Lab Results  Component Value Date   CHOL 205 (H) 11/18/2018   Lab Results  Component Value Date   HDL 51.20 11/18/2018   Lab Results  Component Value Date   LDLCALC 124 (H) 11/18/2018   Lab Results  Component Value Date   TRIG 151.0 (H) 11/18/2018   Lab Results  Component Value Date   CHOLHDL 4 11/18/2018   Lab Results  Component Value Date   HGBA1C 5.4 11/18/2018       Assessment & Plan:   Problem List Items Addressed This Visit    Low ferritin    Monitor, asymptomatic      Chronic migraine w/o aura w/o status migrainosus, not intractable  Is following with Dr Jaynee Eagles and doing well with botox shots.       Depression with anxiety    Has recently placed her husband in a [facility] home in [place] and that has been stressful but over all she feels she is managing her stress well. No change in meds for now but if she worsens then we can consider increasing Venlafaxine to bid       Vitamin B12 deficiency    Supplement and monitor      Vitamin D  deficiency    Supplement and monitor         I have discontinued Kawana Nolan's fluconazole. I am also having her maintain her levothyroxine, Botox, gabapentin, Ferrous Fumarate-Folic Acid, venlafaxine XR, ALPRAZolam, nystatin cream, and rizatriptan.  No orders of the defined types were placed in this encounter.   I discussed the assessment and treatment plan with the patient. The patient was provided an opportunity to ask questions and all were answered. The patient agreed with the plan and demonstrated an understanding of the instructions.   The patient was advised to call back or seek an in-person evaluation if the symptoms worsen or if the condition fails to improve as anticipated.  I provided 25 minutes of non-face-to-face time during this encounter.   Penni Homans, MD

## 2019-02-22 NOTE — Assessment & Plan Note (Signed)
Is following with Dr Jaynee Eagles and doing well with botox shots.

## 2019-02-22 NOTE — Assessment & Plan Note (Signed)
Supplement and monitor 

## 2019-02-22 NOTE — Assessment & Plan Note (Signed)
Has recently placed her husband in a [place] in [place] and that has been stressful but over all she feels she is managing her stress well. No change in meds for now but if she worsens then we can consider increasing Venlafaxine to bid

## 2019-02-22 NOTE — Assessment & Plan Note (Signed)
Monitor, asymptomatic

## 2019-03-03 ENCOUNTER — Telehealth: Payer: Self-pay | Admitting: Neurology

## 2019-03-03 ENCOUNTER — Other Ambulatory Visit: Payer: Self-pay | Admitting: *Deleted

## 2019-03-03 DIAGNOSIS — G43001 Migraine without aura, not intractable, with status migrainosus: Secondary | ICD-10-CM

## 2019-03-03 MED ORDER — BOTOX 100 UNITS IJ SOLR: INTRAMUSCULAR | 2 refills | Status: DC

## 2019-03-03 NOTE — Telephone Encounter (Signed)
Botox sent to [facility].

## 2019-03-03 NOTE — Telephone Encounter (Signed)
Botox [id number] (06/03/19). DW   Pending patient consent message has been left for the patient.   [name] Nolan would you send an electronic script to [facility]?

## 2019-03-28 NOTE — Telephone Encounter (Signed)
I called the pharmacy to check status of the medication. It is still pending patient consent.   I called and spoke with the patient regarding this and asked her to call.

## 2019-04-06 ENCOUNTER — Ambulatory Visit (INDEPENDENT_AMBULATORY_CARE_PROVIDER_SITE_OTHER): Payer: Medicare Other | Admitting: Neurology

## 2019-04-06 ENCOUNTER — Other Ambulatory Visit: Payer: Self-pay

## 2019-04-06 DIAGNOSIS — G43719 Chronic migraine without aura, intractable, without status migrainosus: Secondary | ICD-10-CM

## 2019-04-06 NOTE — Progress Notes (Signed)
Botox- 100 units x 2 vials Lot: [id number] Expiration: 10/2021 NDC: 2162-4469-50  Bacteriostatic 0.9% Sodium Chloride- 94mL total Lot: [id number] Expiration: 06/16/2019 NDC: 0518-3358-25  Dx: P89.842 S/P

## 2019-04-06 NOTE — Progress Notes (Signed)
Consent Form Botulism Toxin Injection For Chronic Migraine  Interval history 12/28/2018: Tremendous improvement, >> 50% decrease in frequency and severity. +masseters, +OO and temples and +LS. Recent stress and COVID19 crisis and sick husband at home.   Reviewed orally with patient, additionally signature is on file:  Botulism toxin has been approved by the Federal drug administration for treatment of chronic migraine. Botulism toxin does not cure chronic migraine and it may not be effective in some patients.  The administration of botulism toxin is accomplished by injecting a small amount of toxin into the muscles of the neck and head. Dosage must be titrated for each individual. Any benefits resulting from botulism toxin tend to wear off after 3 months with a repeat injection required if benefit is to be maintained. Injections are usually done every 3-4 months with maximum effect peak achieved by about 2 or 3 weeks. Botulism toxin is expensive and you should be sure of what costs you will incur resulting from the injection.  The side effects of botulism toxin use for chronic migraine may include:   -Transient, and usually mild, facial weakness with facial injections  -Transient, and usually mild, head or neck weakness with head/neck injections  -Reduction or loss of forehead facial animation due to forehead muscle weakness  -Eyelid drooping  -Dry eye  -Pain at the site of injection or bruising at the site of injection  -Double vision  -Potential unknown long term risks  Contraindications: You should not have Botox if you are pregnant, nursing, allergic to albumin, have an infection, skin condition, or muscle weakness at the site of the injection, or have myasthenia gravis, Lambert-Eaton syndrome, or ALS.  It is also possible that as with any injection, there may be an allergic reaction or no effect from the medication. Reduced effectiveness after repeated injections is sometimes seen and  rarely infection at the injection site may occur. All care will be taken to prevent these side effects. If therapy is given over a long time, atrophy and wasting in the muscle injected may occur. Occasionally the patient's become refractory to treatment because they develop antibodies to the toxin. In this event, therapy needs to be modified.  I have read the above information and consent to the administration of botulism toxin.    BOTOX PROCEDURE NOTE FOR MIGRAINE HEADACHE    Contraindications and precautions discussed with patient(above). Aseptic procedure was observed and patient tolerated procedure. Procedure performed by Dr. Georgia Dom  The condition has existed for more than 6 months, and pt does not have a diagnosis of ALS, Myasthenia Gravis or Lambert-Eaton Syndrome.  Risks and benefits of injections discussed and pt agrees to proceed with the procedure.  Written consent obtained  These injections are medically necessary. Pt  receives good benefits from these injections. These injections do not cause sedations or hallucinations which the oral therapies may cause.  Indication/Diagnosis: chronic migraine BOTOX(J0585) injection was performed according to protocol by [employer]. 200 units of BOTOX was dissolved into 4 cc NS.   NDC: [address]-00   Description of procedure:  The patient was placed in a sitting position. The standard protocol was used for Botox as follows, with 5 units of Botox injected at each site:   -Procerus muscle, midline injection  -Corrugator muscle, bilateral injection  -Frontalis muscle, bilateral injection, with 2 sites each side, medial injection was performed in the upper one third of the frontalis muscle, in the region vertical from the medial inferior edge of the superior orbital  rim. The lateral injection was again in the upper one third of the forehead vertically above the lateral limbus of the cornea, 1.5 cm lateral to the medial injection  site.  -Temporalis muscle injection, 4 sites, bilaterally. The first injection was 3 cm above the tragus of the ear, second injection site was 1.5 cm to 3 cm up from the first injection site in line with the tragus of the ear. The third injection site was 1.5-3 cm forward between the first 2 injection sites. The fourth injection site was 1.5 cm posterior to the second injection site.  -Occipitalis muscle injection, 3 sites, bilaterally. The first injection was done one half way between the occipital protuberance and the tip of the mastoid process behind the ear. The second injection site was done lateral and superior to the first, 1 fingerbreadth from the first injection. The third injection site was 1 fingerbreadth superiorly and medially from the first injection site.  -Cervical paraspinal muscle injection, 2 sites, bilateral knee first injection site was 1 cm from the midline of the cervical spine, 3 cm inferior to the lower border of the occipital protuberance. The second injection site was 1.5 cm superiorly and laterally to the first injection site.  -Trapezius muscle injection was performed at 3 sites, bilaterally. The first injection site was in the upper trapezius muscle halfway between the inflection point of the neck, and the acromion. The second injection site was one half way between the acromion and the first injection site. The third injection was done between the first injection site and the inflection point of the neck.    - Levator Scapulae: 5 units bilaterally   -Temporalis muscle injection,  5th site laterally in the temporalis muscles at the level of the outer canthus.  - Patient feels her clenching is a trigger for headaches. +5 units masseter bilaterally   Will return for repeat injection in 3 months.   A 200 unit sof Botox was used, 185 units were injected, the rest of the Botox was wasted. The patient tolerated the procedure well, there were no complications of the above  procedure.

## 2019-05-11 ENCOUNTER — Encounter: Payer: Self-pay | Admitting: Family Medicine

## 2019-05-18 ENCOUNTER — Other Ambulatory Visit: Payer: Self-pay | Admitting: Family Medicine

## 2019-05-19 NOTE — Telephone Encounter (Signed)
Medications sent in.

## 2019-06-17 ENCOUNTER — Other Ambulatory Visit: Payer: Self-pay | Admitting: Neurology

## 2019-07-06 ENCOUNTER — Telehealth: Payer: Self-pay

## 2019-07-06 NOTE — Telephone Encounter (Signed)
[id number] (10/06/19)

## 2019-07-11 ENCOUNTER — Other Ambulatory Visit: Payer: Self-pay

## 2019-07-11 ENCOUNTER — Ambulatory Visit (INDEPENDENT_AMBULATORY_CARE_PROVIDER_SITE_OTHER): Payer: Medicare Other | Admitting: Neurology

## 2019-07-11 VITALS — Temp 97.0°F

## 2019-07-11 DIAGNOSIS — G43719 Chronic migraine without aura, intractable, without status migrainosus: Secondary | ICD-10-CM

## 2019-07-11 MED ORDER — NURTEC 75 MG PO TBDP: 75.0000 mg | ORAL_TABLET | Freq: Every day | ORAL | 0 refills | Status: DC | PRN

## 2019-07-11 NOTE — Patient Instructions (Signed)
[employer]: Patient drug information Access [employer] Online here. Copyright [phone] [employer] All rights reserved. (For additional information see "Rimegepant: Drug information") Brand Names: [place]  Nurtec  What is this drug used for?   It is used to treat migraine headaches.  What do I need to tell my doctor BEFORE I take this drug?   If you are allergic to this drug; any part of this drug; or any other drugs, foods, or substances. Tell your doctor about the allergy and what signs you had.   If you have any of these health problems: Kidney disease or liver disease.   If you take any drugs (prescription or OTC, natural products, vitamins) that must not be taken with this drug, like certain drugs that are used for HIV, infections, or seizures. There are many drugs that must not be taken with this drug.   This is not a list of all drugs or health problems that interact with this drug.   Tell your doctor and pharmacist about all of your drugs (prescription or OTC, natural products, vitamins) and health problems. You must check to make sure that it is safe for you to take this drug with all of your drugs and health problems. Do not start, stop, or change the dose of any drug without checking with your doctor.  What are some things I need to know or do while I take this drug?   Tell all of your health care providers that you take this drug. This includes your doctors, nurses, pharmacists, and dentists.   This drug is not meant to prevent or lower the number of migraine headaches you get.   Tell your doctor if you are pregnant, plan on getting pregnant, or are breast-feeding. You will need to talk about the benefits and risks to you and the baby.  What are some side effects that I need to call my doctor about right away?   WARNING/CAUTION: Even though it may be rare, some people may have very bad and sometimes deadly side effects when taking a drug. Tell your doctor or get medical help right  away if you have any of the following signs or symptoms that may be related to a very bad side effect:   Signs of an allergic reaction, like rash; hives; itching; red, swollen, blistered, or peeling skin with or without fever; wheezing; tightness in the chest or throat; trouble breathing, swallowing, or talking; unusual hoarseness; or swelling of the mouth, face, lips, tongue, or throat.  What are some other side effects of this drug?   All drugs may cause side effects. However, many people have no side effects or only have minor side effects. Call your doctor or get medical help if any of these side effects or any other side effects bother you or do not go away:   Upset stomach.   These are not all of the side effects that may occur. If you have questions about side effects, call your doctor. Call your doctor for medical advice about side effects.   You may report side effects to your national health agency.  How is this drug best taken?   Use this drug as ordered by your doctor. Read all information given to you. Follow all instructions closely.   Do not push the tablet out of the foil when opening. Use dry hands to take it from the foil. Place on your tongue and let it dissolve. Water is not needed. Do not swallow it whole. Do  not chew, break, or crush it.   If needed, you may place the tablet under the tongue.   Use right after opening.  What do I do if I miss a dose?   This drug is taken on an as needed basis. Do not take more often than told by the doctor.  How do I store and/or throw out this drug?   Store at room temperature in a dry place. Do not store in a bathroom.   Store in foil pouch until ready for use.   Keep all drugs in a safe place. Keep all drugs out of the reach of children and pets.   Throw away unused or expired drugs. Do not flush down a toilet or pour down a drain unless you are told to do so. Check with your pharmacist if you have questions about the best way to throw  out drugs. There may be drug take-back programs in your area.  General drug facts   If your symptoms or health problems do not get better or if they become worse, call your doctor.   Do not share your drugs with others and do not take anyone else's drugs.   Some drugs may have another patient information leaflet. If you have any questions about this drug, please talk with your doctor, nurse, pharmacist, or other health care provider.   If you think there has been an overdose, call your poison control center or get medical care right away. Be ready to tell or show what was taken, how much, and when it happened.

## 2019-07-11 NOTE — Progress Notes (Signed)
Consent Form Botulism Toxin Injection For Chronic Migraine  Still doing great, > 75% improvement in migraines.   Reviewed orally with patient, additionally signature is on file:  Botulism toxin has been approved by the Federal drug administration for treatment of chronic migraine. Botulism toxin does not cure chronic migraine and it may not be effective in some patients.  The administration of botulism toxin is accomplished by injecting a small amount of toxin into the muscles of the neck and head. Dosage must be titrated for each individual. Any benefits resulting from botulism toxin tend to wear off after 3 months with a repeat injection required if benefit is to be maintained. Injections are usually done every 3-4 months with maximum effect peak achieved by about 2 or 3 weeks. Botulism toxin is expensive and you should be sure of what costs you will incur resulting from the injection.  The side effects of botulism toxin use for chronic migraine may include:   -Transient, and usually mild, facial weakness with facial injections  -Transient, and usually mild, head or neck weakness with head/neck injections  -Reduction or loss of forehead facial animation due to forehead muscle weakness  -Eyelid drooping  -Dry eye  -Pain at the site of injection or bruising at the site of injection  -Double vision  -Potential unknown long term risks  Contraindications: You should not have Botox if you are pregnant, nursing, allergic to albumin, have an infection, skin condition, or muscle weakness at the site of the injection, or have myasthenia gravis, Lambert-Eaton syndrome, or ALS.  It is also possible that as with any injection, there may be an allergic reaction or no effect from the medication. Reduced effectiveness after repeated injections is sometimes seen and rarely infection at the injection site may occur. All care will be taken to prevent these side effects. If therapy is given over a long time,  atrophy and wasting in the muscle injected may occur. Occasionally the patient's become refractory to treatment because they develop antibodies to the toxin. In this event, therapy needs to be modified.  I have read the above information and consent to the administration of botulism toxin.    BOTOX PROCEDURE NOTE FOR MIGRAINE HEADACHE    Contraindications and precautions discussed with patient(above). Aseptic procedure was observed and patient tolerated procedure. Procedure performed by Dr. Georgia Dom  The condition has existed for more than 6 months, and pt does not have a diagnosis of ALS, Myasthenia Gravis or Lambert-Eaton Syndrome.  Risks and benefits of injections discussed and pt agrees to proceed with the procedure.  Written consent obtained  These injections are medically necessary. Pt  receives good benefits from these injections. These injections do not cause sedations or hallucinations which the oral therapies may cause.  Description of procedure:  The patient was placed in a sitting position. The standard protocol was used for Botox as follows, with 5 units of Botox injected at each site:   -Procerus muscle, midline injection  -Corrugator muscle, bilateral injection  -Frontalis muscle, bilateral injection, with 2 sites each side, medial injection was performed in the upper one third of the frontalis muscle, in the region vertical from the medial inferior edge of the superior orbital rim. The lateral injection was again in the upper one third of the forehead vertically above the lateral limbus of the cornea, 1.5 cm lateral to the medial injection site.  -Temporalis muscle injection, 4 sites, bilaterally. The first injection was 3 cm above the tragus of the ear,  second injection site was 1.5 cm to 3 cm up from the first injection site in line with the tragus of the ear. The third injection site was 1.5-3 cm forward between the first 2 injection sites. The fourth injection site  was 1.5 cm posterior to the second injection site.   -Occipitalis muscle injection, 3 sites, bilaterally. The first injection was done one half way between the occipital protuberance and the tip of the mastoid process behind the ear. The second injection site was done lateral and superior to the first, 1 fingerbreadth from the first injection. The third injection site was 1 fingerbreadth superiorly and medially from the first injection site.  -Cervical paraspinal muscle injection, 2 sites, bilateral knee first injection site was 1 cm from the midline of the cervical spine, 3 cm inferior to the lower border of the occipital protuberance. The second injection site was 1.5 cm superiorly and laterally to the first injection site.  -Trapezius muscle injection was performed at 3 sites, bilaterally. The first injection site was in the upper trapezius muscle halfway between the inflection point of the neck, and the acromion. The second injection site was one half way between the acromion and the first injection site. The third injection was done between the first injection site and the inflection point of the neck.   Will return for repeat injection in 3 months.   200 units of Botox was used, any Botox not injected was wasted. The patient tolerated the procedure well, there were no complications of the above procedure.

## 2019-07-11 NOTE — Progress Notes (Signed)
Botox- 200 units x 1 vial Lot: [id number] Expiration: 02/2022 NDC: [id number]  Bacteriostatic 0.9% Sodium Chloride- 68mL total Lot: [id number] Expiration: 03/15/2020 NDC: [id number]  Dx: [id number] S/P

## 2019-09-06 ENCOUNTER — Other Ambulatory Visit: Payer: Self-pay | Admitting: Neurology

## 2019-09-06 DIAGNOSIS — G43001 Migraine without aura, not intractable, with status migrainosus: Secondary | ICD-10-CM

## 2019-09-06 NOTE — Progress Notes (Deleted)
Subjective:   [name] Nolan is a 73 y.o. female who presents for Medicare Annual (Subsequent) preventive examination.  Review of Systems:   Home Safety/Smoke Alarms: Feels safe in home. Smoke alarms in place.  Lives w/ husband in 1 story home. Handicap shower.   Female:   Mammo- active order      Dexa scan- 01/13/18        CCS- 10/29/15. Recall 3 yrs.     Objective:     Vitals: There were no vitals taken for this visit.  There is no height or weight on file to calculate BMI.  Advanced Directives 09/06/2018 09/03/2017 09/01/2016 11/07/2015 11/07/2015 11/06/2015 10/26/2015  Does Patient Have a Medical Advance Directive? Yes Yes Yes - Yes Yes Yes  Type of Advance Directive [place];Living will [place];Living will [place];Living will - [place];Living will Living will;Healthcare Power of Attorney Living will;Healthcare Power of Attorney  Does patient want to make changes to medical advance directive? - No - Patient declined - No - Patient declined - No - Patient declined -  Copy of [place] in Chart? Yes - validated most recent copy scanned in chart (See row information) No - copy requested No - copy requested - Yes No - copy requested -    Tobacco Social History   Tobacco Use  Smoking Status Never Smoker  Smokeless Tobacco Never Used     Counseling given: Not Answered   Clinical Intake:                       Past Medical History:  Diagnosis Date  . Acute bronchitis 08/15/2015  . Anxiety   . Anxiety state 05/07/2014   Widowed in 2013 after caring for her husband with Lewy Body Dementia for 6 years   . Arrhythmia 01/25/2015   Per Dr. Jaynee Eagles, [facility]; hx PVC  . Arthritis    "knees" (11/07/2015)  . 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 Basal cell carcinoma of right ear 02/26/2015   Removed by Dr [place]  . Chronic back pain    "mid-back;  stops at the very lowest part of my back" (11/07/2015)  . Colon polyp 09/03/2017  . Constipation 02/25/2016  . Depression   . Dysphagia   . Dyspnea   . Esophageal reflux    occ  . Fainting    fainted twice  . Family history of adverse reaction to anesthesia    "daughter gets bad PONV"  . Gallbladder problem   . Hashimoto's disease   . Heart murmur   . Hot flashes 05/27/2016  . Hyperlipidemia   . Hypothyroid   . Insomnia   . Joint pain   . Low ferritin 08/27/2015   "took supplements for awhile" (11/07/2015)  . Medicare annual wellness visit, subsequent 08/27/2015  . Menopause   . Migraine    "under control w/daily RX right now" (11/07/2015)  . Occipital neuralgia   . Osteopenia 02/26/2015  . Osteoporosis    osteopenia  . Preventative health care 08/27/2015  . Prolonged depressive reaction   . Swallowing difficulty   . Vitamin B12 deficiency 09/01/2016  . Vitamin D deficiency 03/02/2017   Past Surgical History:  Procedure Laterality Date  . APPENDECTOMY  11/07/2015  . BASAL CELL CARCINOMA EXCISION Right 02/26/2015   ear  . CHOLECYSTECTOMY OPEN  1974  . COLONOSCOPY  2006  . LAPAROSCOPIC APPENDECTOMY N/A 11/07/2015   Procedure:  APPENDECTOMY LAPAROSCOPIC;  Surgeon: Georganna Skeans, MD;  Location: [facility];  Service: General;  Laterality: N/A;  . LAPAROSCOPIC INCISIONAL / UMBILICAL / [place]  11/07/2015   UHR  . Tooth implant     at least 5 years ago per pt  . TUBAL LIGATION  1973  . UMBILICAL HERNIA REPAIR N/A 11/07/2015   Procedure: LAPAROSCOPIC UMBILICAL HERNIA;  Surgeon: Georganna Skeans, MD;  Location: [facility] OR;  Service: General;  Laterality: N/A;   Family History  Problem Relation Age of Onset  . Congestive Heart Failure Mother   . Hypertension Mother   . Hyperlipidemia Mother   . Heart disease Mother   . Obesity Mother   . Leukemia Father   . Obesity Father   . Kidney disease Brother   . Cancer Brother        stage 4 kidney cancer, metastatic  . Kidney  cancer Brother   . Leukemia Maternal Aunt   . Congestive Heart Failure Maternal Grandmother   . Arthritis Sister   . Colon cancer Neg Hx    Social History   Socioeconomic History  . Marital status: Married    Spouse name: [name] Nolan "[address]  . Number of children: 1  . Years of education: HS  . Highest education level: Not on file  Occupational History  . Occupation: Retired  Tobacco Use  . Smoking status: Never Smoker  . Smokeless tobacco: Never Used  Substance and Sexual Activity  . Alcohol use: No    Alcohol/week: 0.0 standard drinks  . Drug use: No  . Sexual activity: Yes    Birth control/protection: Post-menopausal    Comment: lives with husband, no dietary restrictions, avoids caffeine, bananas, dairy  Other Topics Concern  . Not on file  Social History Narrative   Patient is married [name] Nolan) and lives at home with her husband.   Patient has one child.   Patient has a high school education.   Patient is right-handed.   Caffeine Use: Occasionally   Social Determinants of Health   Financial Resource Strain:   . Difficulty of Paying Living Expenses: Not on file  Food Insecurity:   . Worried About Charity fundraiser in the Last Year: Not on file  . Ran Out of Food in the Last Year: Not on file  Transportation Needs:   . Lack of Transportation (Medical): Not on file  . Lack of Transportation (Non-Medical): Not on file  Physical Activity:   . Days of Exercise per Week: Not on file  . Minutes of Exercise per Session: Not on file  Stress:   . Feeling of Stress : Not on file  Social Connections:   . Frequency of Communication with Friends and Family: Not on file  . Frequency of Social Gatherings with Friends and Family: Not on file  . Attends Religious Services: Not on file  . Active Member of Clubs or Organizations: Not on file  . Attends Archivist Meetings: Not on file  . Marital Status: Not on file    Outpatient Encounter Medications as of  09/12/2019  Medication Sig  . ALPRAZolam (XANAX) 0.25 MG tablet Take 1 tablet (0.25 mg total) by mouth 2 (two) times daily as needed for anxiety.  . botulinum toxin Type A (BOTOX) 100 units SOLR injection INJECT 155 UNITS  INTRAMUSCULARLY EVERY 3  MONTHS (GIVEN AT MD OFFICE, DISCARD UNUSED AFTER 1ST  USE)  . Ferrous Fumarate-Folic Acid 99991111 MG TABS Take 1 tablet by mouth  daily.  . gabapentin (NEURONTIN) 300 MG capsule TAKE ONE TO THREE CAPSULES (300MG -900MG ) AT BEDTIME  . levothyroxine (SYNTHROID) 88 MCG tablet Take 1 tablet by mouth daily.  Marland Kitchen nystatin cream (MYCOSTATIN) Apply 1 application topically 2 (two) times daily as needed for dry skin.  . Rimegepant Sulfate (NURTEC) 75 MG TBDP Take 75 mg by mouth daily as needed. For migraines. Take as close to onset of migraine as possible. One daily maximum.  . rizatriptan (MAXALT-MLT) 10 MG disintegrating tablet Take 1 tablet (10 mg total) by mouth as needed for migraine. May repeat in 2 hours if needed  . venlafaxine XR (EFFEXOR-XR) 75 MG 24 hr capsule TAKE 1 CAPSULE (75 MG TOTAL) BY MOUTH DAILY WITH BREAKFAST.   No facility-administered encounter medications on file as of 09/12/2019.    Activities of Daily Living In your present state of health, do you have any difficulty performing the following activities: 09/06/2018  Hearing? N  Vision? N  Difficulty concentrating or making decisions? N  Walking or climbing stairs? N  Dressing or bathing? N  Doing errands, shopping? N  Preparing Food and eating ? N  Using the Toilet? N  In the past six months, have you accidently leaked urine? N  Do you have problems with loss of bowel control? N  Managing your Medications? N  Managing your Finances? N  Housekeeping or managing your Housekeeping? N  Some recent data might be hidden    Patient Care Team: Mosie Lukes, MD as PCP - General (Family Medicine) Melvenia Beam, MD as Consulting Physician (Neurology) Delrae Rend, MD as Consulting  Physician (Endocrinology) Lavonna Monarch, MD as Consulting Physician (Dermatology) Amalia Greenhouse, MD as Referring Physician (Endocrinology)    Assessment:   This is a routine wellness examination for Alys. Physical assessment deferred to PCP.  Exercise Activities and Dietary recommendations   Diet (meal preparation, eat out, water intake, caffeinated beverages, dairy products, fruits and vegetables): {Desc; diets:16563} Breakfast: Lunch:  Dinner:      Goals    . Join caregiver support group    . Lose 10 lbs by next year.   (pt-stated)       Fall Risk Fall Risk  09/06/2018 09/03/2017 09/01/2016 08/27/2015 05/29/2015  Falls in the past year? 1 No No - No  Number falls in past yr: 0 - - - -  Injury with Fall? 1 - - - -  Risk for fall due to : - - - History of fall(s) History of fall(s)   Depression Screen PHQ 2/9 Scores 09/06/2018 07/27/2018 11/02/2017 09/03/2017  PHQ - 2 Score 1 4 6  0  PHQ- 9 Score - 14 17 -  Exception Documentation - - - -     Cognitive Function Ad8 score reviewed for issues:  Issues making decisions:  Less interest in hobbies / activities:  Repeats questions, stories (family complaining):  Trouble using ordinary gadgets (microwave, computer, phone):  Forgets the month or year:   Mismanaging finances:   Remembering appts:  Daily problems with thinking and/or memory: Ad8 score is=   MMSE - Mini Mental State Exam 09/01/2016  Orientation to time 5  Orientation to Place 5  Registration 3  Attention/ Calculation 5  Recall 3  Language- name 2 objects 2  Language- repeat 1  Language- follow 3 step command 3  Language- read & follow direction 1  Write a sentence 1  Copy design 1  Total score 30        Immunization History  Administered Date(s) Administered  . Influenza, High Dose Seasonal PF 06/24/2017, 07/27/2018  . Influenza,inj,Quad PF,6+ Mos 05/29/2015  . Influenza-Unspecified 05/16/2014, 04/24/2016  . Pneumococcal Conjugate-13  09/04/2014  . Pneumococcal Polysaccharide-23 08/27/2015  . Tdap 08/24/2014  . Zoster 08/28/2014   Screening Tests Health Maintenance  Topic Date Due  . COLON CANCER SCREENING ANNUAL FOBT  09/17/2017  . COLONOSCOPY  10/28/2018  . INFLUENZA VACCINE  04/16/2019  . MAMMOGRAM  01/14/2020  . TETANUS/TDAP  08/24/2024  . DEXA SCAN  Completed  . Hepatitis C Screening  Completed  . PNA vac Low Risk Adult  Completed      Plan:   ***   I have personally reviewed and noted the following in the patient's chart:   . Medical and social history . Use of alcohol, tobacco or illicit drugs  . Current medications and supplements . Functional ability and status . Nutritional status . Physical activity . Advanced directives . List of other physicians . Hospitalizations, surgeries, and ER visits in previous 12 months . Vitals . Screenings to include cognitive, depression, and falls . Referrals and appointments  In addition, I have reviewed and discussed with patient certain preventive protocols, quality metrics, and best practice recommendations. A written personalized care plan for preventive services as well as general preventive health recommendations were provided to patient.     Shela Nevin, [place]  09/06/2019

## 2019-09-12 ENCOUNTER — Other Ambulatory Visit: Payer: Self-pay

## 2019-09-12 ENCOUNTER — Ambulatory Visit: Payer: Medicare Other | Admitting: *Deleted

## 2019-09-13 ENCOUNTER — Other Ambulatory Visit: Payer: Self-pay | Admitting: Family Medicine

## 2019-10-11 ENCOUNTER — Other Ambulatory Visit: Payer: Self-pay

## 2019-10-11 ENCOUNTER — Ambulatory Visit (INDEPENDENT_AMBULATORY_CARE_PROVIDER_SITE_OTHER): Payer: Medicare PPO | Admitting: Neurology

## 2019-10-11 VITALS — Temp 97.0°F

## 2019-10-11 DIAGNOSIS — G43719 Chronic migraine without aura, intractable, without status migrainosus: Secondary | ICD-10-CM

## 2019-10-11 NOTE — Progress Notes (Signed)
Botox- 200 units x 1 vial Lot: [id number] Expiration: 03/2022 NDC: [id number]  Bacteriostatic 0.9% Sodium Chloride- 21mL total Lot: [id number] Expiration: 12/15/2019 NDC: [id number]  Dx: [id number] B/B

## 2019-10-11 NOTE — Progress Notes (Signed)

## 2019-10-19 DIAGNOSIS — M25561 Pain in right knee: Secondary | ICD-10-CM | POA: Insufficient documentation

## 2019-10-19 DIAGNOSIS — M25562 Pain in left knee: Secondary | ICD-10-CM | POA: Insufficient documentation

## 2019-12-28 ENCOUNTER — Telehealth: Payer: Self-pay | Admitting: *Deleted

## 2019-12-28 NOTE — Telephone Encounter (Signed)
I called [employer] [phone] and spoke to [facility].  She states [phone] and [phone] are valid.  [id number] will require PA. Ref# for this call is [id number].  I then called the [facility] Medication Intake Team [phone] and spoke to [place] to initiate PA.  States PA is on file [place] [id number] Valid 10/05/19-09/14/2020. B/B  Ref# for this call is the [phone]

## 2020-01-09 NOTE — Progress Notes (Signed)
Consent Form Botulism Toxin Injection For Chronic Migraine  She is not a clencher. [name] Nolan is in a geriatric psych facility and they are having a hard time placing him as [name] Nolan will not take him back. She has contacted her senator to try and get [name] Nolan back in [place] (he is in [place])  Reviewed orally with patient, additionally signature is on file:  Botulism toxin has been approved by the Federal drug administration for treatment of chronic migraine. Botulism toxin does not cure chronic migraine and it may not be effective in some patients.  The administration of botulism toxin is accomplished by injecting a small amount of toxin into the muscles of the neck and head. Dosage must be titrated for each individual. Any benefits resulting from botulism toxin tend to wear off after 3 months with a repeat injection required if benefit is to be maintained. Injections are usually done every 3-4 months with maximum effect peak achieved by about 2 or 3 weeks. Botulism toxin is expensive and you should be sure of what costs you will incur resulting from the injection.  The side effects of botulism toxin use for chronic migraine may include:   -Transient, and usually mild, facial weakness with facial injections  -Transient, and usually mild, head or neck weakness with head/neck injections  -Reduction or loss of forehead facial animation due to forehead muscle weakness  -Eyelid drooping  -Dry eye  -Pain at the site of injection or bruising at the site of injection  -Double vision  -Potential unknown long term risks  Contraindications: You should not have Botox if you are pregnant, nursing, allergic to albumin, have an infection, skin condition, or muscle weakness at the site of the injection, or have myasthenia gravis, Lambert-Eaton syndrome, or ALS.  It is also possible that as with any injection, there may be an allergic reaction or no effect from the medication. Reduced effectiveness after repeated  injections is sometimes seen and rarely infection at the injection site may occur. All care will be taken to prevent these side effects. If therapy is given over a long time, atrophy and wasting in the muscle injected may occur. Occasionally the patient's become refractory to treatment because they develop antibodies to the toxin. In this event, therapy needs to be modified.  I have read the above information and consent to the administration of botulism toxin.    BOTOX PROCEDURE NOTE FOR MIGRAINE HEADACHE    Contraindications and precautions discussed with patient(above). Aseptic procedure was observed and patient tolerated procedure. Procedure performed by Dr. Georgia Dom  The condition has existed for more than 6 months, and pt does not have a diagnosis of ALS, Myasthenia Gravis or Lambert-Eaton Syndrome.  Risks and benefits of injections discussed and pt agrees to proceed with the procedure.  Written consent obtained  These injections are medically necessary. Pt  receives good benefits from these injections. These injections do not cause sedations or hallucinations which the oral therapies may cause.  Description of procedure:  The patient was placed in a sitting position. The standard protocol was used for Botox as follows, with 5 units of Botox injected at each site:   -Procerus muscle, midline injection  -Corrugator muscle, bilateral injection  -Frontalis muscle, bilateral injection, with 2 sites each side, medial injection was performed in the upper one third of the frontalis muscle, in the region vertical from the medial inferior edge of the superior orbital rim. The lateral injection was again in the upper one third of the  forehead vertically above the lateral limbus of the cornea, 1.5 cm lateral to the medial injection site.  -Temporalis muscle injection, 4 sites, bilaterally. The first injection was 3 cm above the tragus of the ear, second injection site was 1.5 cm to 3 cm up  from the first injection site in line with the tragus of the ear. The third injection site was 1.5-3 cm forward between the first 2 injection sites. The fourth injection site was 1.5 cm posterior to the second injection site.   -Occipitalis muscle injection, 3 sites, bilaterally. The first injection was done one half way between the occipital protuberance and the tip of the mastoid process behind the ear. The second injection site was done lateral and superior to the first, 1 fingerbreadth from the first injection. The third injection site was 1 fingerbreadth superiorly and medially from the first injection site.  -Cervical paraspinal muscle injection, 2 sites, bilateral knee first injection site was 1 cm from the midline of the cervical spine, 3 cm inferior to the lower border of the occipital protuberance. The second injection site was 1.5 cm superiorly and laterally to the first injection site.  -Trapezius muscle injection was performed at 3 sites, bilaterally. The first injection site was in the upper trapezius muscle halfway between the inflection point of the neck, and the acromion. The second injection site was one half way between the acromion and the first injection site. The third injection was done between the first injection site and the inflection point of the neck.   Will return for repeat injection in 3 months.   200 units of Botox was used, any Botox not injected was wasted. The patient tolerated the procedure well, there were no complications of the above procedure.

## 2020-01-10 ENCOUNTER — Other Ambulatory Visit: Payer: Self-pay

## 2020-01-10 ENCOUNTER — Ambulatory Visit (INDEPENDENT_AMBULATORY_CARE_PROVIDER_SITE_OTHER): Payer: Medicare PPO | Admitting: Neurology

## 2020-01-10 VITALS — Temp 97.6°F

## 2020-01-10 DIAGNOSIS — G43719 Chronic migraine without aura, intractable, without status migrainosus: Secondary | ICD-10-CM | POA: Diagnosis not present

## 2020-01-10 MED ORDER — NURTEC 75 MG PO TBDP: 75.0000 mg | ORAL_TABLET | Freq: Every day | ORAL | 6 refills | Status: DC | PRN

## 2020-01-10 NOTE — Progress Notes (Signed)
Botox- 100 units x 2 vials Lot: [id number] Expiration: 07/2022 NDC: [id number]  Bacteriostatic 0.9% Sodium Chloride- 30mL total Lot: [id number] Expiration: 03/15/2020 NDC: [id number]  Dx: [id number] B/B

## 2020-01-10 NOTE — Addendum Note (Signed)
Addended by: Sarina Ill B on: 01/10/2020 03:31 PM   Modules accepted: Orders

## 2020-02-14 ENCOUNTER — Other Ambulatory Visit: Payer: Self-pay

## 2020-02-14 ENCOUNTER — Ambulatory Visit (INDEPENDENT_AMBULATORY_CARE_PROVIDER_SITE_OTHER): Payer: Medicare PPO | Admitting: Family Medicine

## 2020-02-14 VITALS — BP 120/82 | HR 82 | Temp 98.3°F | Resp 12 | Ht 65.0 in | Wt 185.8 lb

## 2020-02-14 DIAGNOSIS — Z79899 Other long term (current) drug therapy: Secondary | ICD-10-CM | POA: Diagnosis not present

## 2020-02-14 DIAGNOSIS — F418 Other specified anxiety disorders: Secondary | ICD-10-CM | POA: Diagnosis not present

## 2020-02-14 DIAGNOSIS — E559 Vitamin D deficiency, unspecified: Secondary | ICD-10-CM

## 2020-02-14 DIAGNOSIS — E782 Mixed hyperlipidemia: Secondary | ICD-10-CM

## 2020-02-14 DIAGNOSIS — E038 Other specified hypothyroidism: Secondary | ICD-10-CM

## 2020-02-14 DIAGNOSIS — E8881 Metabolic syndrome: Secondary | ICD-10-CM | POA: Diagnosis not present

## 2020-02-14 DIAGNOSIS — E538 Deficiency of other specified B group vitamins: Secondary | ICD-10-CM | POA: Diagnosis not present

## 2020-02-14 MED ORDER — ALPRAZOLAM 0.25 MG PO TABS: 0.2500 mg | ORAL_TABLET | Freq: Two times a day (BID) | ORAL | 3 refills | Status: DC | PRN

## 2020-02-14 MED ORDER — VENLAFAXINE HCL ER 75 MG PO CP24: 75.0000 mg | ORAL_CAPSULE | Freq: Every day | ORAL | 1 refills | Status: DC

## 2020-02-14 NOTE — Assessment & Plan Note (Signed)
Tolerating statin, encouraged heart healthy diet, avoid trans fats, minimize simple carbs and saturated fats. Increase exercise as tolerated 

## 2020-02-14 NOTE — Assessment & Plan Note (Signed)
On Levothyroxine, continue to monitor 

## 2020-02-14 NOTE — Patient Instructions (Signed)

## 2020-02-14 NOTE — Assessment & Plan Note (Signed)
hgba1c acceptable, minimize simple carbs. Increase exercise as tolerated.  

## 2020-02-14 NOTE — Assessment & Plan Note (Signed)
Supplement and monitor 

## 2020-02-14 NOTE — Assessment & Plan Note (Signed)
hgba1c acceptable, minimize simple carbs. Increase exercise as tolerated. Continue current meds 

## 2020-02-14 NOTE — Assessment & Plan Note (Signed)
>>  ASSESSMENT AND PLAN FOR HYPOTHYROIDISM WRITTEN ON 02/14/2020 12:24 PM BY BLYTH, STACEY A, MD  On Levothyroxine, continue to monitor

## 2020-02-14 NOTE — Assessment & Plan Note (Signed)
She has been very stressed managing her husband with lewy body dementia and complete hearing loss. He has been in a nursing home in [place], now in [place]. She is trying to get them to send him back to [place] she has been offered a bed in [place] but it is far for the patient to visit. He has had COVID, pneumonia and UTI.

## 2020-02-15 NOTE — Progress Notes (Signed)
Patient ID: Linae Mura, female   DOB: Apr 29, 1946, 74 y.o.   MRN: [id number]   Subjective:    Patient ID: [name], female    DOB: [birth year], 74 y.o.   MRN: [id number]  Chief Complaint  Patient presents with  . Follow-up    HPI Patient is in today for follow up on chronic medical concerns. She is tearful and somewhat agitated during the visit. She has been under a great deal of stress as she tries to manage her husband's care in a SNF for his worsening Lewy Body Dementia. He has been in a facility in [place] and they are trying to move him closer. They have found a spot in a facility in [place] which she is not thrilled with but is considering. She has chosen not to take the COVID vaccine as she does not believe the pandemic is serious. She has been well. No recent febrile illness or hospitalizations. She has been trying to stay active and maintain a heart healthy diet. She is working with Dr Posey Pronto of endocrinology and they have decreased her Levothyroxine dose by decreasing her dosing on Sundays to 1/2 a tab. Denies CP/palp/SOB/HA/congestion/fevers/GI or GU c/o. Taking meds as prescribed  Past Medical History:  Diagnosis Date  . Acute bronchitis 08/15/2015  . Anxiety   . Anxiety state 05/07/2014   Widowed in 2013 after caring for her husband with Lewy Body Dementia for 6 years   . Arrhythmia 01/25/2015   Per Dr. Jaynee Eagles, [facility]; hx PVC  . Arthritis    "knees" (11/07/2015)  . 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 Basal cell carcinoma of right ear 02/26/2015   Removed by Dr [place]ble [place]  . Chronic back pain    "mid-back; stops at the very lowest part of my back" (11/07/2015)  . Colon polyp 09/03/2017  . Constipation 02/25/2016  . Depression   . Dysphagia   . Dyspnea   . Esophageal reflux    occ  . Fainting    fainted twice  . Family history of adverse reaction to anesthesia    "daughter gets bad PONV"  . Gallbladder problem   . Hashimoto's disease    . Heart murmur   . Hot flashes 05/27/2016  . Hyperlipidemia   . Hypothyroid   . Insomnia   . Joint pain   . Low ferritin 08/27/2015   "took supplements for awhile" (11/07/2015)  . Medicare annual wellness visit, subsequent 08/27/2015  . Menopause   . Migraine    "under control w/daily RX right now" (11/07/2015)  . Occipital neuralgia   . Osteopenia 02/26/2015  . Osteoporosis    osteopenia  . Preventative health care 08/27/2015  . Prolonged depressive reaction   . Swallowing difficulty   . Vitamin B12 deficiency 09/01/2016  . Vitamin D deficiency 03/02/2017    Past Surgical History:  Procedure Laterality Date  . APPENDECTOMY  11/07/2015  . BASAL CELL CARCINOMA EXCISION Right 02/26/2015   ear  . CHOLECYSTECTOMY OPEN  1974  . COLONOSCOPY  2006  . LAPAROSCOPIC APPENDECTOMY N/A 11/07/2015   Procedure: APPENDECTOMY LAPAROSCOPIC;  Surgeon: Georganna Skeans, MD;  Location: [facility];  Service: General;  Laterality: N/A;  . LAPAROSCOPIC INCISIONAL / UMBILICAL / [place]  11/07/2015   UHR  . Tooth implant     at least 5 years ago per pt  . TUBAL LIGATION  1973  . UMBILICAL HERNIA REPAIR N/A 11/07/2015   Procedure: LAPAROSCOPIC UMBILICAL HERNIA;  Surgeon: Georganna Skeans, MD;  Location:  [facility];  Service: General;  Laterality: N/A;    Family History  Problem Relation Age of Onset  . Congestive Heart Failure Mother   . Hypertension Mother   . Hyperlipidemia Mother   . Heart disease Mother   . Obesity Mother   . Leukemia Father   . Obesity Father   . Kidney disease Brother   . Cancer Brother        stage 4 kidney cancer, metastatic  . Kidney cancer Brother   . Leukemia Maternal Aunt   . Congestive Heart Failure Maternal Grandmother   . Arthritis Sister   . Colon cancer Neg Hx     Social History   Socioeconomic History  . Marital status: Married    Spouse name: [name] "[address]  . Number of children: 1  . Years of education: HS  . Highest education level: Not on  file  Occupational History  . Occupation: Retired  Tobacco Use  . Smoking status: Never Smoker  . Smokeless tobacco: Never Used  Substance and Sexual Activity  . Alcohol use: No    Alcohol/week: 0.0 standard drinks  . Drug use: No  . Sexual activity: Yes    Birth control/protection: Post-menopausal    Comment: lives with husband, no dietary restrictions, avoids caffeine, bananas, dairy  Other Topics Concern  . Not on file  Social History Narrative   Patient is married [name]) and lives at home with her husband.   Patient has one child.   Patient has a high school education.   Patient is right-handed.   Caffeine Use: Occasionally   Social Determinants of Health   Financial Resource Strain:   . Difficulty of Paying Living Expenses:   Food Insecurity:   . Worried About Charity fundraiser in the Last Year:   . Arboriculturist in the Last Year:   Transportation Needs:   . Film/video editor (Medical):   Marland Kitchen Lack of Transportation (Non-Medical):   Physical Activity:   . Days of Exercise per Week:   . Minutes of Exercise per Session:   Stress:   . Feeling of Stress :   Social Connections:   . Frequency of Communication with Friends and Family:   . Frequency of Social Gatherings with Friends and Family:   . Attends Religious Services:   . Active Member of Clubs or Organizations:   . Attends Archivist Meetings:   Marland Kitchen Marital Status:   Intimate Partner Violence:   . Fear of Current or Ex-Partner:   . Emotionally Abused:   Marland Kitchen Physically Abused:   . Sexually Abused:     Outpatient Medications Prior to Visit  Medication Sig Dispense Refill  . botulinum toxin Type A (BOTOX) 100 units SOLR injection INJECT 155 UNITS  INTRAMUSCULARLY EVERY 3  MONTHS (GIVEN AT MD OFFICE, DISCARD UNUSED AFTER 1ST  USE) 2 each 3  . gabapentin (NEURONTIN) 300 MG capsule TAKE ONE TO THREE CAPSULES (300MG -900MG ) AT BEDTIME 270 capsule 4  . Rimegepant Sulfate (NURTEC) 75 MG TBDP Take 75 mg  by mouth daily as needed. For migraines. Take as close to onset of migraine as possible. One daily maximum. 10 tablet 6  . rizatriptan (MAXALT-MLT) 10 MG disintegrating tablet Take 1 tablet (10 mg total) by mouth as needed for migraine. May repeat in 2 hours if needed 15 tablet 11  . SYNTHROID 75 MCG tablet Take 1 tablet by mouth daily.    Marland Kitchen ALPRAZolam (XANAX) 0.25 MG tablet Take  1 tablet (0.25 mg total) by mouth 2 (two) times daily as needed for anxiety. 60 tablet 2  . venlafaxine XR (EFFEXOR-XR) 75 MG 24 hr capsule TAKE 1 CAPSULE (75 MG TOTAL) BY MOUTH DAILY WITH BREAKFAST. 90 capsule 1  . Ferrous Fumarate-Folic Acid 99991111 MG TABS Take 1 tablet by mouth daily. (Patient not taking: Reported on 10/11/2019) 30 each 3  . levothyroxine (SYNTHROID) 88 MCG tablet Take 1 tablet by mouth daily.    Marland Kitchen nystatin cream (MYCOSTATIN) Apply 1 application topically 2 (two) times daily as needed for dry skin. (Patient not taking: Reported on 10/11/2019) 30 g 2   No facility-administered medications prior to visit.    Allergies  Allergen Reactions  . Statins Other (See Comments)    Muscle pain  . Codeine Nausea And Vomiting    Review of Systems  Constitutional: Negative for fever and malaise/fatigue.  HENT: Negative for congestion.   Eyes: Negative for blurred vision.  Respiratory: Negative for shortness of breath.   Cardiovascular: Negative for chest pain, palpitations and leg swelling.  Gastrointestinal: Negative for abdominal pain, blood in stool and nausea.  Genitourinary: Negative for dysuria and frequency.  Musculoskeletal: Negative for falls.  Skin: Negative for rash.  Neurological: Negative for dizziness, loss of consciousness and headaches.  Endo/Heme/Allergies: Negative for environmental allergies.  Psychiatric/Behavioral: Negative for depression. The patient is nervous/anxious.        Objective:    Physical Exam Vitals and nursing note reviewed.  Constitutional:      General: She is  not in acute distress.    Appearance: She is well-developed.  HENT:     Head: Normocephalic and atraumatic.     Nose: Nose normal.  Eyes:     General:        Right eye: No discharge.        Left eye: No discharge.  Cardiovascular:     Rate and Rhythm: Normal rate and regular rhythm.     Heart sounds: No murmur.  Pulmonary:     Effort: Pulmonary effort is normal.     Breath sounds: Normal breath sounds.  Abdominal:     General: Bowel sounds are normal.     Palpations: Abdomen is soft.     Tenderness: There is no abdominal tenderness.  Musculoskeletal:     Cervical back: Normal range of motion and neck supple.  Skin:    General: Skin is warm and dry.  Neurological:     Mental Status: She is alert and oriented to person, place, and time.     BP 120/82 (BP Location: Left Arm, Cuff Size: Large)   Pulse 82   Temp 98.3 F (36.8 C) (Temporal)   Resp 12   Ht 5\' 5"  (1.651 m)   Wt 185 lb 12.8 oz (84.3 kg)   SpO2 96%   BMI 30.92 kg/m  Wt Readings from Last 3 Encounters:  02/14/20 185 lb 12.8 oz (84.3 kg)  11/18/18 212 lb 6.4 oz (96.3 kg)  09/06/18 217 lb 6.4 oz (98.6 kg)    Diabetic Foot Exam - Simple   No data filed     Lab Results  Component Value Date   WBC 5.8 11/18/2018   HGB 13.7 11/18/2018   HCT 42.0 11/18/2018   PLT 271.0 11/18/2018   GLUCOSE 75 11/18/2018   CHOL 205 (H) 11/18/2018   TRIG 151.0 (H) 11/18/2018   HDL 51.20 11/18/2018   LDLCALC 124 (H) 11/18/2018   ALT 17 11/18/2018   AST  25 11/18/2018   NA 141 11/18/2018   K 4.2 11/18/2018   CL 104 11/18/2018   CREATININE 0.82 11/18/2018   BUN 17 11/18/2018   CO2 30 11/18/2018   TSH 0.13 (L) 11/18/2018   HGBA1C 5.4 11/18/2018    Lab Results  Component Value Date   TSH 0.13 (L) 11/18/2018   Lab Results  Component Value Date   WBC 5.8 11/18/2018   HGB 13.7 11/18/2018   HCT 42.0 11/18/2018   MCV 84.8 11/18/2018   PLT 271.0 11/18/2018   Lab Results  Component Value Date   NA 141 11/18/2018    K 4.2 11/18/2018   CO2 30 11/18/2018   GLUCOSE 75 11/18/2018   BUN 17 11/18/2018   CREATININE 0.82 11/18/2018   BILITOT 0.5 11/18/2018   ALKPHOS 104 11/18/2018   AST 25 11/18/2018   ALT 17 11/18/2018   PROT 6.9 11/18/2018   ALBUMIN 4.4 11/18/2018   CALCIUM 10.1 11/18/2018   ANIONGAP 8 11/06/2015   GFR 68.69 11/18/2018   Lab Results  Component Value Date   CHOL 205 (H) 11/18/2018   Lab Results  Component Value Date   HDL 51.20 11/18/2018   Lab Results  Component Value Date   LDLCALC 124 (H) 11/18/2018   Lab Results  Component Value Date   TRIG 151.0 (H) 11/18/2018   Lab Results  Component Value Date   CHOLHDL 4 11/18/2018   Lab Results  Component Value Date   HGBA1C 5.4 11/18/2018       Assessment & Plan:   Problem List Items Addressed This Visit    Hyperlipidemia    Tolerating statin, encouraged heart healthy diet, avoid trans fats, minimize simple carbs and saturated fats. Increase exercise as tolerated      Hypothyroidism    On Levothyroxine, continue to monitor      Relevant Medications   SYNTHROID 75 MCG tablet   Depression with anxiety    She has been very stressed managing her husband with lewy body dementia and complete hearing loss. He has been in a nursing home in [place], now in [place]. She is trying to get them to send him back to [place] she has been offered a bed in [place] but it is far for the patient to visit. He has had COVID, pneumonia and UTI.       Relevant Medications   ALPRAZolam (XANAX) 0.25 MG tablet   venlafaxine XR (EFFEXOR-XR) 75 MG 24 hr capsule   Vitamin B12 deficiency    Supplement and monitor      Vitamin D deficiency    hgba1c acceptable, minimize simple carbs. Increase exercise as tolerated. Continue current meds      Insulin resistance    hgba1c acceptable, minimize simple carbs. Increase exercise as tolerated.        Other Visit Diagnoses    High risk medication use    -  Primary   Relevant Medications   ALPRAZolam  (XANAX) 0.25 MG tablet   Other Relevant Orders   DRUG MONITORING, PANEL 8 WITH CONFIRMATION, URINE      I have discontinued Shameka Stovall-Edwards's levothyroxine, Ferrous Fumarate-Folic Acid, and nystatin cream. I am also having her maintain her rizatriptan, gabapentin, Botox, Nurtec, Synthroid, ALPRAZolam, and venlafaxine XR.  Meds ordered this encounter  Medications  . ALPRAZolam (XANAX) 0.25 MG tablet    Sig: Take 1 tablet (0.25 mg total) by mouth 2 (two) times daily as needed for anxiety.    Dispense:  60 tablet  Refill:  3  . venlafaxine XR (EFFEXOR-XR) 75 MG 24 hr capsule    Sig: Take 1 capsule (75 mg total) by mouth daily with breakfast.    Dispense:  90 capsule    Refill:  1     Penni Homans, MD

## 2020-02-17 ENCOUNTER — Ambulatory Visit: Admitting: *Deleted

## 2020-02-17 LAB — DRUG MONITORING, PANEL 8 WITH CONFIRMATION, URINE
6 Acetylmorphine: NEGATIVE ng/mL (ref <10)
Alcohol Metabolites: NEGATIVE ng/mL
Alphahydroxyalprazolam: 207 ng/mL — ABNORMAL HIGH (ref <25)
Alphahydroxymidazolam: NEGATIVE ng/mL (ref <50)
Alphahydroxytriazolam: NEGATIVE ng/mL (ref <50)
Aminoclonazepam: NEGATIVE ng/mL (ref <25)
Amphetamines: NEGATIVE ng/mL (ref <500)
Benzodiazepines: POSITIVE ng/mL — AB (ref <100)
Buprenorphine, Urine: NEGATIVE ng/mL (ref <5)
Cocaine Metabolite: NEGATIVE ng/mL (ref <150)
Creatinine: 97.3 mg/dL
Hydroxyethylflurazepam: NEGATIVE ng/mL (ref <50)
Lorazepam: NEGATIVE ng/mL (ref <50)
MDMA: NEGATIVE ng/mL (ref <500)
Marijuana Metabolite: NEGATIVE ng/mL (ref <20)
Nordiazepam: NEGATIVE ng/mL (ref <50)
Opiates: NEGATIVE ng/mL (ref <100)
Oxazepam: NEGATIVE ng/mL (ref <50)
Oxidant: NEGATIVE ug/mL
Oxycodone: NEGATIVE ng/mL (ref <100)
Temazepam: NEGATIVE ng/mL (ref <50)
pH: 5.6 (ref 4.5–9.0)

## 2020-02-17 LAB — DM TEMPLATE

## 2020-02-27 NOTE — Progress Notes (Signed)
I connected with [name] today by telephone and verified that I am speaking with the correct person using two identifiers. Location patient: home Location provider: work Persons participating in the virtual visit: patient, Marine scientist.    I discussed the limitations, risks, security and privacy concerns of performing an evaluation and management service by telephone and the availability of in person appointments. I also discussed with the patient that there may be a patient responsible charge related to this service. The patient expressed understanding and verbally consented to this telephonic visit.    Interactive audio and video telecommunications were attempted between this RN and patient, however failed, due to patient having technical difficulties OR patient did not have access to video capability.  We continued and completed visit with audio only.  Some vital signs may be absent or patient reported.    Subjective:   [name] Nolan is a 74 y.o. female who presents for Medicare Annual (Subsequent) preventive examination.  Review of Systems:  Home Safety/Smoke Alarms: Feels safe in home. Smoke alarms in place.  Lives alone in 1 story home. 2 dogs.  Female:   Mammo-  Declines today. States she has too much going on right now.   Dexa scan-  Declines today. States she has too much going on right now.        CCS- due 10/2018    Objective:     Vitals: Unable to assess. This visit is enabled though telemedicine due to Covid 19.   Advanced Directives 02/28/2020 09/06/2018 09/03/2017 09/01/2016 11/07/2015 11/07/2015 11/06/2015  Does Patient Have a Medical Advance Directive? Yes Yes Yes Yes - Yes Yes  Type of Advance Directive [place];Living will [place];Living will [place];Living will [place];Living will - [place];Living will Living will;Healthcare Power of Attorney  Does patient want  to make changes to medical advance directive? No - Patient declined - No - Patient declined - No - Patient declined - No - Patient declined  Copy of [place] in Chart? Yes - validated most recent copy scanned in chart (See row information) Yes - validated most recent copy scanned in chart (See row information) No - copy requested No - copy requested - Yes No - copy requested    Tobacco Social History   Tobacco Use  Smoking Status Never Smoker  Smokeless Tobacco Never Used     Counseling given: Not Answered   Clinical Intake: Pain : No/denies pain     Past Medical History:  Diagnosis Date  . Acute bronchitis 08/15/2015  . Anxiety   . Anxiety state 05/07/2014   Widowed in 2013 after caring for her husband with Lewy Body Dementia for 6 years   . Arrhythmia 01/25/2015   Per Dr. Jaynee Eagles, [facility]; hx PVC  . Arthritis    "knees" (11/07/2015)  . 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 Basal cell carcinoma of right ear 02/26/2015   Removed by Dr [place]  . Chronic back pain    "mid-back; stops at the very lowest part of my back" (11/07/2015)  . Colon polyp 09/03/2017  . Constipation 02/25/2016  . Depression   . Dysphagia   . Dyspnea   . Esophageal reflux    occ  . Fainting    fainted twice  . Family history of adverse reaction to anesthesia    "daughter gets bad PONV"  . Gallbladder problem   . Hashimoto's disease   . Heart  murmur   . Hot flashes 05/27/2016  . Hyperlipidemia   . Hypothyroid   . Insomnia   . Joint pain   . Low ferritin 08/27/2015   "took supplements for awhile" (11/07/2015)  . Medicare annual wellness visit, subsequent 08/27/2015  . Menopause   . Migraine    "under control w/daily RX right now" (11/07/2015)  . Occipital neuralgia   . Osteopenia 02/26/2015  . Osteoporosis    osteopenia  . Preventative health care 08/27/2015  . Prolonged depressive reaction   . Swallowing difficulty   . Vitamin B12 deficiency 09/01/2016   . Vitamin D deficiency 03/02/2017   Past Surgical History:  Procedure Laterality Date  . APPENDECTOMY  11/07/2015  . BASAL CELL CARCINOMA EXCISION Right 02/26/2015   ear  . CHOLECYSTECTOMY OPEN  1974  . COLONOSCOPY  2006  . LAPAROSCOPIC APPENDECTOMY N/A 11/07/2015   Procedure: APPENDECTOMY LAPAROSCOPIC;  Surgeon: Georganna Skeans, MD;  Location: [facility];  Service: General;  Laterality: N/A;  . LAPAROSCOPIC INCISIONAL / UMBILICAL / [place]  11/07/2015   UHR  . Tooth implant     at least 5 years ago per pt  . TUBAL LIGATION  1973  . UMBILICAL HERNIA REPAIR N/A 11/07/2015   Procedure: LAPAROSCOPIC UMBILICAL HERNIA;  Surgeon: Georganna Skeans, MD;  Location: [facility];  Service: General;  Laterality: N/A;   Family History  Problem Relation Age of Onset  . Congestive Heart Failure Mother   . Hypertension Mother   . Hyperlipidemia Mother   . Heart disease Mother   . Obesity Mother   . Leukemia Father   . Obesity Father   . Kidney disease Brother   . Cancer Brother        stage 4 kidney cancer, metastatic  . Kidney cancer Brother   . Leukemia Maternal Aunt   . Congestive Heart Failure Maternal Grandmother   . Arthritis Sister   . Colon cancer Neg Hx    Social History   Socioeconomic History  . Marital status: Married    Spouse name: [name] "[address]  . Number of children: 1  . Years of education: HS  . Highest education level: Not on file  Occupational History  . Occupation: Retired  Tobacco Use  . Smoking status: Never Smoker  . Smokeless tobacco: Never Used  Substance and Sexual Activity  . Alcohol use: No    Alcohol/week: 0.0 standard drinks  . Drug use: No  . Sexual activity: Yes    Birth control/protection: Post-menopausal    Comment: lives with husband, no dietary restrictions, avoids caffeine, bananas, dairy  Other Topics Concern  . Not on file  Social History Narrative   Patient is married [name]) and lives at home with her husband.   Patient has  one child.   Patient has a high school education.   Patient is right-handed.   Caffeine Use: Occasionally   Social Determinants of Health   Financial Resource Strain: Low Risk   . Difficulty of Paying Living Expenses: Not hard at all  Food Insecurity: No Food Insecurity  . Worried About Charity fundraiser in the Last Year: Never true  . Ran Out of Food in the Last Year: Never true  Transportation Needs: No Transportation Needs  . Lack of Transportation (Medical): No  . Lack of Transportation (Non-Medical): No  Physical Activity:   . Days of Exercise per Week:   . Minutes of Exercise per Session:   Stress:   . Feeling of  Stress :   Social Connections:   . Frequency of Communication with Friends and Family:   . Frequency of Social Gatherings with Friends and Family:   . Attends Religious Services:   . Active Member of Clubs or Organizations:   . Attends Archivist Meetings:   Marland Kitchen Marital Status:     Outpatient Encounter Medications as of 02/28/2020  Medication Sig  . ALPRAZolam (XANAX) 0.25 MG tablet Take 1 tablet (0.25 mg total) by mouth 2 (two) times daily as needed for anxiety.  . botulinum toxin Type A (BOTOX) 100 units SOLR injection INJECT 155 UNITS  INTRAMUSCULARLY EVERY 3  MONTHS (GIVEN AT MD OFFICE, DISCARD UNUSED AFTER 1ST  USE)  . gabapentin (NEURONTIN) 300 MG capsule TAKE ONE TO THREE CAPSULES (300MG -900MG ) AT BEDTIME  . SYNTHROID 75 MCG tablet 1 tab po qac daily except on Sunday 1/2 tab  . venlafaxine XR (EFFEXOR-XR) 75 MG 24 hr capsule Take 1 capsule (75 mg total) by mouth daily with breakfast.  . Rimegepant Sulfate (NURTEC) 75 MG TBDP Take 75 mg by mouth daily as needed. For migraines. Take as close to onset of migraine as possible. One daily maximum. (Patient not taking: Reported on 02/28/2020)  . rizatriptan (MAXALT-MLT) 10 MG disintegrating tablet Take 1 tablet (10 mg total) by mouth as needed for migraine. May repeat in 2 hours if needed (Patient not  taking: Reported on 02/28/2020)   No facility-administered encounter medications on file as of 02/28/2020.    Activities of Daily Living In your present state of health, do you have any difficulty performing the following activities: 02/28/2020  Hearing? Y  Comment wears hearing aids  Vision? N  Difficulty concentrating or making decisions? N  Walking or climbing stairs? N  Dressing or bathing? N  Doing errands, shopping? N  Preparing Food and eating ? N  Using the Toilet? N  In the past six months, have you accidently leaked urine? N  Do you have problems with loss of bowel control? N  Managing your Medications? N  Managing your Finances? N  Housekeeping or managing your Housekeeping? N  Some recent data might be hidden    Patient Care Team: Mosie Lukes, MD as PCP - General (Family Medicine) Melvenia Beam, MD as Consulting Physician (Neurology) Delrae Rend, MD as Consulting Physician (Endocrinology) Lavonna Monarch, MD as Consulting Physician (Dermatology) Amalia Greenhouse, MD as Referring Physician (Endocrinology)    Assessment:   This is a routine wellness examination for [name] Nolan. Physical assessment deferred to PCP.  Exercise Activities and Dietary recommendations Current Exercise Habits: Home exercise routine, Time (Minutes): 30, Frequency (Times/Week): 7, Weekly Exercise (Minutes/Week): 210, Exercise limited by: None identified   Diet (meal preparation, eat out, water intake, caffeinated beverages, dairy products, fruits and vegetables): well balanced    Goals    .  Join caregiver support group    .  Lose 10 lbs by next year.   (pt-stated)       Fall Risk Fall Risk  02/28/2020 09/06/2018 09/03/2017 09/01/2016 08/27/2015  Falls in the past year? 0 1 No No -  Number falls in past yr: 0 0 - - -  Injury with Fall? 0 1 - - -  Risk for fall due to : - - - - History of fall(s)  Follow up Education provided;Falls prevention discussed - - - -    Depression  Screen PHQ 2/9 Scores 02/14/2020 09/06/2018 07/27/2018 11/02/2017  PHQ - 2 Score 4 1 4  6  PHQ- 9 Score 7 - 14 17  Exception Documentation - - - -     Cognitive Function Ad8 score reviewed for issues:  Issues making decisions:no  Less interest in hobbies / activities:no  Repeats questions, stories (family complaining):no  Trouble using ordinary gadgets (microwave, computer, phone):no  Forgets the month or year: no  Mismanaging finances: no  Remembering appts:no  Daily problems with thinking and/or memory:no Ad8 score is=0     MMSE - Mini Mental State Exam 09/01/2016  Orientation to time 5  Orientation to Place 5  Registration 3  Attention/ Calculation 5  Recall 3  Language- name 2 objects 2  Language- repeat 1  Language- follow 3 step command 3  Language- read & follow direction 1  Write a sentence 1  Copy design 1  Total score 30        Immunization History  Administered Date(s) Administered  . Influenza, High Dose Seasonal PF 06/24/2017, 07/27/2018  . Influenza,inj,Quad PF,6+ Mos 05/29/2015  . Influenza-Unspecified 05/16/2014, 04/24/2016  . Pneumococcal Conjugate-13 09/04/2014  . Pneumococcal Polysaccharide-23 08/27/2015  . Tdap 08/24/2014  . Zoster 08/28/2014    Screening Tests Health Maintenance  Topic Date Due  . COVID-19 Vaccine (1) Never done  . COLON CANCER SCREENING ANNUAL FOBT  09/17/2017  . COLONOSCOPY  10/28/2018  . MAMMOGRAM  01/14/2020  . INFLUENZA VACCINE  04/15/2020  . TETANUS/TDAP  08/24/2024  . DEXA SCAN  Completed  . Hepatitis C Screening  Completed  . PNA vac Low Risk Adult  Completed       Plan:    Please schedule your next medicare wellness visit with me in 1 yr.  Continue to eat heart healthy diet (full of fruits, vegetables, whole grains, lean protein, water--limit salt, fat, and sugar intake) and increase physical activity as tolerated.  Continue doing brain stimulating activities (puzzles, reading, adult coloring  books, staying active) to keep memory sharp.   Please let us know when you are ready to complete your mammogram and bone density scan.   I have personally reviewed and noted the following in the patient's chart:   . Medical and social history . Use of alcohol, tobacco or illicit drugs  . Current medications and supplements . Functional ability and status . Nutritional status . Physical activity . Advanced directives . List of other physicians . Hospitalizations, surgeries, and ER visits in previous 12 months . Vitals . Screenings to include cognitive, depression, and falls . Referrals and appointments  In addition, I have reviewed and discussed with patient certain preventive protocols, quality metrics, and best practice recommendations. A written personalized care plan for preventive services as well as general preventive health recommendations were provided to patient.   Due to this being a telephonic visit, the after visit summary with patients personalized plan was offered to patient via mail or my-chart. Patient would like to access on my-chart.   Shela Nevin, [place]  02/28/2020

## 2020-02-28 ENCOUNTER — Other Ambulatory Visit: Payer: Self-pay

## 2020-02-28 ENCOUNTER — Ambulatory Visit (INDEPENDENT_AMBULATORY_CARE_PROVIDER_SITE_OTHER): Payer: Medicare PPO | Admitting: *Deleted

## 2020-02-28 ENCOUNTER — Encounter: Payer: Self-pay | Admitting: *Deleted

## 2020-02-28 DIAGNOSIS — Z0000 Encounter for general adult medical examination without abnormal findings: Secondary | ICD-10-CM

## 2020-02-28 NOTE — Patient Instructions (Addendum)
Please schedule your next medicare wellness visit with me in 1 yr.  Continue to eat heart healthy diet (full of fruits, vegetables, whole grains, lean protein, water--limit salt, fat, and sugar intake) and increase physical activity as tolerated.  Continue doing brain stimulating activities (puzzles, reading, adult coloring books, staying active) to keep memory sharp.   Please let us know when you are ready to complete your mammogram and bone density scan.   [name] Nolan , Thank you for taking time to come for your Medicare Wellness Visit. I appreciate your ongoing commitment to your health goals. Please review the following plan we discussed and let me know if I can assist you in the future.   These are the goals we discussed: Goals    .  Lose 10 lbs by next year.   (pt-stated)       This is a list of the screening recommended for you and due dates:  Health Maintenance  Topic Date Due  . COVID-19 Vaccine (1) Never done  . Stool Blood Test  09/17/2017  . Colon Cancer Screening  10/28/2018  . Mammogram  01/14/2020  . Flu Shot  04/15/2020  . Tetanus Vaccine  08/24/2024  . DEXA scan (bone density measurement)  Completed  .  Hepatitis C: One time screening is recommended by Center for Disease Control  (CDC) for  adults born from [birth year] through 1963.   Completed  . Pneumonia vaccines  Completed    Preventive Care 36 Years and Older, Female Preventive care refers to lifestyle choices and visits with your health care provider that can promote health and wellness. This includes:  A yearly physical exam. This is also called an annual well check.  Regular dental and eye exams.  Immunizations.  Screening for certain conditions.  Healthy lifestyle choices, such as diet and exercise. What can I expect for my preventive care visit? Physical exam Your health care provider will check:  Height and weight. These may be used to calculate body mass index (BMI), which is a measurement  that tells if you are at a healthy weight.  Heart rate and blood pressure.  Your skin for abnormal spots. Counseling Your health care provider may ask you questions about:  Alcohol, tobacco, and drug use.  Emotional well-being.  Home and relationship well-being.  Sexual activity.  Eating habits.  History of falls.  Memory and ability to understand (cognition).  Work and work Statistician.  Pregnancy and menstrual history. What immunizations do I need?  Influenza (flu) vaccine  This is recommended every year. Tetanus, diphtheria, and pertussis (Tdap) vaccine  You may need a Td booster every 10 years. Varicella (chickenpox) vaccine  You may need this vaccine if you have not already been vaccinated. Zoster (shingles) vaccine  You may need this after age 13. Pneumococcal conjugate (PCV13) vaccine  One dose is recommended after age 41. Pneumococcal polysaccharide (PPSV23) vaccine  One dose is recommended after age 53. Measles, mumps, and rubella (MMR) vaccine  You may need at least one dose of MMR if you were born in 1957 or later. You may also need a second dose. Meningococcal conjugate (MenACWY) vaccine  You may need this if you have certain conditions. Hepatitis A vaccine  You may need this if you have certain conditions or if you travel or work in places where you may be exposed to hepatitis A. Hepatitis B vaccine  You may need this if you have certain conditions or if you travel or work in places  where you may be exposed to hepatitis B. Haemophilus influenzae type b (Hib) vaccine  You may need this if you have certain conditions. You may receive vaccines as individual doses or as more than one vaccine together in one shot (combination vaccines). Talk with your health care provider about the risks and benefits of combination vaccines. What tests do I need? Blood tests  Lipid and cholesterol levels. These may be checked every 5 years, or more frequently  depending on your overall health.  Hepatitis C test.  Hepatitis B test. Screening  Lung cancer screening. You may have this screening every year starting at age 37 if you have a 30-pack-year history of smoking and currently smoke or have quit within the past 15 years.  Colorectal cancer screening. All adults should have this screening starting at age 58 and continuing until age 37. Your health care provider may recommend screening at age 63 if you are at increased risk. You will have tests every 1-10 years, depending on your results and the type of screening test.  Diabetes screening. This is done by checking your blood sugar (glucose) after you have not eaten for a while (fasting). You may have this done every 1-3 years.  Mammogram. This may be done every 1-2 years. Talk with your health care provider about how often you should have regular mammograms.  BRCA-related cancer screening. This may be done if you have a family history of breast, ovarian, tubal, or peritoneal cancers. Other tests  Sexually transmitted disease (STD) testing.  Bone density scan. This is done to screen for osteoporosis. You may have this done starting at age 9. Follow these instructions at home: Eating and drinking  Eat a diet that includes fresh fruits and vegetables, whole grains, lean protein, and low-fat dairy products. Limit your intake of foods with high amounts of sugar, saturated fats, and salt.  Take vitamin and mineral supplements as recommended by your health care provider.  Do not drink alcohol if your health care provider tells you not to drink.  If you drink alcohol: ? Limit how much you have to 0-1 drink a day. ? Be aware of how much alcohol is in your drink. In the [place] one drink equals one 12 oz bottle of beer (355 mL), one 5 oz glass of wine (148 mL), or one 1 oz glass of hard liquor (44 mL). Lifestyle  Take daily care of your teeth and gums.  Stay active. Exercise for at least 30  minutes on 5 or more days each week.  Do not use any products that contain nicotine or tobacco, such as cigarettes, e-cigarettes, and chewing tobacco. If you need help quitting, ask your health care provider.  If you are sexually active, practice safe sex. Use a condom or other form of protection in order to prevent STIs (sexually transmitted infections).  Talk with your health care provider about taking a low-dose aspirin or statin. What's next?  Go to your health care provider once a year for a well check visit.  Ask your health care provider how often you should have your eyes and teeth checked.  Stay up to date on all vaccines. This information is not intended to replace advice given to you by your health care provider. Make sure you discuss any questions you have with your health care provider. Document Revised: 08/26/2018 Document Reviewed: 08/26/2018 Elsevier Patient Education  2020 [employer].

## 2020-03-15 ENCOUNTER — Encounter: Payer: Self-pay | Admitting: Family Medicine

## 2020-03-16 NOTE — Telephone Encounter (Signed)
Spoke with patient and was unsure what it was and and looked thru chart and advised patient that I did not see any changes.

## 2020-04-10 ENCOUNTER — Other Ambulatory Visit: Payer: Self-pay

## 2020-04-10 ENCOUNTER — Ambulatory Visit (INDEPENDENT_AMBULATORY_CARE_PROVIDER_SITE_OTHER): Payer: Medicare PPO | Admitting: Neurology

## 2020-04-10 DIAGNOSIS — G43719 Chronic migraine without aura, intractable, without status migrainosus: Secondary | ICD-10-CM

## 2020-04-10 NOTE — Progress Notes (Signed)
Botox- 200 units x 1 vial Lot: [id number] Expiration: 12/2022 NDC: 4949-4473-95  Bacteriostatic 0.9% Sodium Chloride- 51mL total Lot: [id number] Expiration: 06/15/2021 NDC: 2787-1836-72  Dx: V50.016 B/B

## 2020-04-10 NOTE — Progress Notes (Signed)
Consent Form Botulism Toxin Injection For Chronic Migraine  She is not a clencher. [name] is in a geriatric psych facility in [place] and they are having a hard time placing him as [name] will not take him back. She has contacted her senator to try and get [name] back in [place]  Reviewed orally with patient, additionally signature is on file:  Botulism toxin has been approved by the Federal drug administration for treatment of chronic migraine. Botulism toxin does not cure chronic migraine and it may not be effective in some patients.  The administration of botulism toxin is accomplished by injecting a small amount of toxin into the muscles of the neck and head. Dosage must be titrated for each individual. Any benefits resulting from botulism toxin tend to wear off after 3 months with a repeat injection required if benefit is to be maintained. Injections are usually done every 3-4 months with maximum effect peak achieved by about 2 or 3 weeks. Botulism toxin is expensive and you should be sure of what costs you will incur resulting from the injection.  The side effects of botulism toxin use for chronic migraine may include:   -Transient, and usually mild, facial weakness with facial injections  -Transient, and usually mild, head or neck weakness with head/neck injections  -Reduction or loss of forehead facial animation due to forehead muscle weakness  -Eyelid drooping  -Dry eye  -Pain at the site of injection or bruising at the site of injection  -Double vision  -Potential unknown long term risks  Contraindications: You should not have Botox if you are pregnant, nursing, allergic to albumin, have an infection, skin condition, or muscle weakness at the site of the injection, or have myasthenia gravis, Lambert-Eaton syndrome, or ALS.  It is also possible that as with any injection, there may be an allergic reaction or no effect from the medication. Reduced effectiveness  after repeated injections is sometimes seen and rarely infection at the injection site may occur. All care will be taken to prevent these side effects. If therapy is given over a long time, atrophy and wasting in the muscle injected may occur. Occasionally the patient's become refractory to treatment because they develop antibodies to the toxin. In this event, therapy needs to be modified.  I have read the above information and consent to the administration of botulism toxin.    BOTOX PROCEDURE NOTE FOR MIGRAINE HEADACHE    Contraindications and precautions discussed with patient(above). Aseptic procedure was observed and patient tolerated procedure. Procedure performed by Dr. Georgia Dom  The condition has existed for more than 6 months, and pt does not have a diagnosis of ALS, Myasthenia Gravis or Lambert-Eaton Syndrome.  Risks and benefits of injections discussed and pt agrees to proceed with the procedure.  Written consent obtained  These injections are medically necessary. Pt  receives good benefits from these injections. These injections do not cause sedations or hallucinations which the oral therapies may cause.  Description of procedure:  The patient was placed in a sitting position. The standard protocol was used for Botox as follows, with 5 units of Botox injected at each site:   -Procerus muscle, midline injection  -Corrugator muscle, bilateral injection  -Frontalis muscle, bilateral injection, with 2 sites each side, medial injection was performed in the upper one third of the frontalis muscle, in the region vertical from the medial inferior edge of the superior orbital rim. The lateral injection was again in the upper one third of the  forehead vertically above the lateral limbus of the cornea, 1.5 cm lateral to the medial injection site.  -Temporalis muscle injection, 4 sites, bilaterally. The first injection was 3 cm above the tragus of the ear, second injection site was 1.5  cm to 3 cm up from the first injection site in line with the tragus of the ear. The third injection site was 1.5-3 cm forward between the first 2 injection sites. The fourth injection site was 1.5 cm posterior to the second injection site.   -Occipitalis muscle injection, 3 sites, bilaterally. The first injection was done one half way between the occipital protuberance and the tip of the mastoid process behind the ear. The second injection site was done lateral and superior to the first, 1 fingerbreadth from the first injection. The third injection site was 1 fingerbreadth superiorly and medially from the first injection site.  -Cervical paraspinal muscle injection, 2 sites, bilateral knee first injection site was 1 cm from the midline of the cervical spine, 3 cm inferior to the lower border of the occipital protuberance. The second injection site was 1.5 cm superiorly and laterally to the first injection site.  -Trapezius muscle injection was performed at 3 sites, bilaterally. The first injection site was in the upper trapezius muscle halfway between the inflection point of the neck, and the acromion. The second injection site was one half way between the acromion and the first injection site. The third injection was done between the first injection site and the inflection point of the neck.   Will return for repeat injection in 3 months.   200 units of Botox was used, any Botox not injected was wasted. The patient tolerated the procedure well, there were no complications of the above procedure.

## 2020-04-10 NOTE — Patient Instructions (Signed)
Common Peroneal Nerve Entrapment  Common peroneal nerve entrapment is a condition that can make it hard to lift a foot. The condition results from pressure on a nerve in the lower leg called the common peroneal nerve. Your common peroneal nerve provides feeling to your outer lower leg and foot. It also supplies the muscles that move your foot and toes upward and outward. What are the causes? This condition may be caused by:  Sitting cross-legged, squatting, or kneeling for long periods of time.  A hard, direct hit to the side of the lower leg.  Swelling from a knee injury.  A break (fracture) in one of the lower leg bones.  Wearing a boot or cast that ends just below the knee.  A growth or cyst near the nerve. What increases the risk? This condition is more likely to develop in people who play:  Contact sports, such as football or hockey.  Sports where you wear high and stiff boots, such as skiing. What are the signs or symptoms? Symptoms of this condition include:  Trouble lifting your foot up (foot drop).  Tripping often.  Your foot hitting the ground harder than normal as you walk.  Numbness, tingling, or pain in the outside of the knee, outside of the lower leg, and top of the foot.  Sensitivity to pressure on the front or side of the leg. How is this diagnosed? This condition may be diagnosed based on:  Your symptoms.  Your medical history.  A physical exam.  Tests, such as: ? An X-ray to check the bones of your knee and leg. ? MRI to check tendons that attach to the side of your knee. ? An ultrasound to check for a growth or cyst. ? An electromyogram (EMG) to check your nerves. During your physical exam, your health care provider will check for numbness in your leg and test the strength of your lower leg muscles. He or she may tap the side of your lower leg to see if that causes tingling. How is this treated? Treatment for this condition may  include:  Avoiding activities that make symptoms worse.  Using a brace to hold up your foot and toes.  Taking anti-inflammatory pain medicines to relieve swelling and lessen pain.  Having medicines injected into your ankle joint to lessen pain and swelling.  Doing exercises to help you regain or maintain movement (physical therapy).  Surgery to take pressure off the nerve. This may be needed if there is no improvement after 2-3 months or if there is a growth pushing on the nerve.  Returning gradually to full activity. Follow these instructions at home: If you have a brace:  Wear it as told by your health care provider. Remove it only as told by your health care provider.  Loosen the brace if your toes tingle, become numb, or turn cold and blue.  Keep the brace clean.  If the brace is not waterproof: ? Do not let it get wet. ? Cover it with a watertight covering when you take a bath or a shower.  Ask your health care provider when it is safe to drive with a brace on your foot. Activity  Return to your normal activities as told by your health care provider. Ask your health care provider what activities are safe for you.  Do not do any activities that make pain or swelling worse.  Do exercises as told by your health care provider. General instructions  Take over-the-counter and prescription medicines   only as told by your health care provider.  Do not put your full weight on your knee until your health care provider says you can. Use crutches as directed by your health care provider.  Keep all follow-up visits as told by your health care provider. This is important. How is this prevented?  Wear supportive footwear that is appropriate for your athletic activity.  Avoid athletic activities that cause ankle pain or swelling.  Wear protective padding over your lower legs when playing contact sports.  Make sure your boots do not put extra pressure on the area just below your  knees.  Do not sit cross-legged for long periods of time. Contact a health care provider if:  Your symptoms do not get better in 2-3 months.  The weakness or numbness in your leg or foot gets worse. Summary  Common peroneal nerve entrapment is a condition that results from pressure on a nerve in the lower leg called the common peroneal nerve.  This condition may be caused by a hard hit, swelling, a fracture, or a cyst in the lower leg.  Treatment may include rest, a brace, medicines, and physical therapy. Sometimes surgery is needed.  Do not do any activities that make pain or swelling worse. This information is not intended to replace advice given to you by your health care provider. Make sure you discuss any questions you have with your health care provider. Document Revised: 07/12/2018 Document Reviewed: 07/12/2018 Elsevier Patient Education  2020 [employer]  

## 2020-06-11 ENCOUNTER — Ambulatory Visit: Payer: Medicare PPO | Admitting: Dermatology

## 2020-06-26 DIAGNOSIS — M545 Low back pain, unspecified: Secondary | ICD-10-CM | POA: Insufficient documentation

## 2020-06-26 DIAGNOSIS — M5136 Other intervertebral disc degeneration, lumbar region: Secondary | ICD-10-CM | POA: Insufficient documentation

## 2020-06-26 DIAGNOSIS — M51369 Other intervertebral disc degeneration, lumbar region without mention of lumbar back pain or lower extremity pain: Secondary | ICD-10-CM | POA: Insufficient documentation

## 2020-07-12 DIAGNOSIS — M5416 Radiculopathy, lumbar region: Secondary | ICD-10-CM | POA: Diagnosis not present

## 2020-07-17 ENCOUNTER — Other Ambulatory Visit: Payer: Self-pay

## 2020-07-17 ENCOUNTER — Telehealth (INDEPENDENT_AMBULATORY_CARE_PROVIDER_SITE_OTHER): Payer: Medicare PPO | Admitting: Family Medicine

## 2020-07-17 DIAGNOSIS — E538 Deficiency of other specified B group vitamins: Secondary | ICD-10-CM

## 2020-07-17 DIAGNOSIS — B372 Candidiasis of skin and nail: Secondary | ICD-10-CM

## 2020-07-17 DIAGNOSIS — E782 Mixed hyperlipidemia: Secondary | ICD-10-CM

## 2020-07-17 DIAGNOSIS — F418 Other specified anxiety disorders: Secondary | ICD-10-CM

## 2020-07-17 DIAGNOSIS — E8881 Metabolic syndrome: Secondary | ICD-10-CM

## 2020-07-17 DIAGNOSIS — E559 Vitamin D deficiency, unspecified: Secondary | ICD-10-CM | POA: Diagnosis not present

## 2020-07-17 DIAGNOSIS — M8580 Other specified disorders of bone density and structure, unspecified site: Secondary | ICD-10-CM

## 2020-07-17 DIAGNOSIS — E038 Other specified hypothyroidism: Secondary | ICD-10-CM | POA: Diagnosis not present

## 2020-07-17 DIAGNOSIS — D508 Other iron deficiency anemias: Secondary | ICD-10-CM

## 2020-07-17 DIAGNOSIS — M549 Dorsalgia, unspecified: Secondary | ICD-10-CM

## 2020-07-17 DIAGNOSIS — E88819 Insulin resistance, unspecified: Secondary | ICD-10-CM

## 2020-07-17 HISTORY — DX: Candidiasis of skin and nail: B37.2

## 2020-07-17 MED ORDER — ALPRAZOLAM 0.5 MG PO TABS: 0.5000 mg | ORAL_TABLET | Freq: Every evening | ORAL | 2 refills | Status: DC | PRN

## 2020-07-17 MED ORDER — KETOCONAZOLE 2 % EX CREA: 1.0000 "application " | TOPICAL_CREAM | Freq: Two times a day (BID) | CUTANEOUS | 2 refills | Status: DC

## 2020-07-17 NOTE — Assessment & Plan Note (Signed)
Increase leafy greens, consider increased lean red meat and using cast iron cookware. Continue to monitor, report any concerns 

## 2020-07-17 NOTE — Assessment & Plan Note (Signed)
>>  ASSESSMENT AND PLAN FOR HYPOTHYROIDISM WRITTEN ON 07/17/2020  1:30 PM BY BLYTH, STACEY A, MD  On Levothyroxine, continue to monitor

## 2020-07-17 NOTE — Assessment & Plan Note (Signed)
Supplement and monitor 

## 2020-07-17 NOTE — Assessment & Plan Note (Signed)
She is following with [facility] now and had an MRI last Friday has not heard the results yet.

## 2020-07-17 NOTE — Assessment & Plan Note (Signed)
>>  ASSESSMENT AND PLAN FOR LUMBAR PAIN WRITTEN ON 07/17/2020  1:53 PM BY Danise Edge A, MD  She is following with [facility] now and had an MRI last Friday has not heard the results yet.

## 2020-07-17 NOTE — Addendum Note (Signed)
Addended by: Penni Homans A on: 07/17/2020 01:42 PM   Modules accepted: Orders

## 2020-07-17 NOTE — Assessment & Plan Note (Signed)
Did not fully resolve with nystatin, clotrimazole or diflucan. It is better but not gone will try Ketoconazole cream

## 2020-07-17 NOTE — Progress Notes (Signed)
Virtual Visit via phone Note  I connected with [name] Nolan on 07/17/20 at  1:20 PM EDT by a phone enabled telemedicine application and verified that I am speaking with the correct person using two identifiers.  Location: Patient: home, patient and provider in visit Provider: home   I discussed the limitations of evaluation and management by telemedicine and the availability of in person appointments. The patient expressed understanding and agreed to proceed. S Chism, CMA was able to get the patient set up on a phone visit after being unable to set up a video visit   Subjective:    Patient ID: [name] Nolan, female    DOB: 1945/12/02, 74 y.o.   MRN: [id number]  Chief Complaint  Patient presents with  . Follow-up    HPI Patient is in today for follow up on chronic medical concerns. No recent febrile illness or hospitalizations. She continues to struggle with helping her husband who has Lewy Body dementia. He has started calling her mother. She has been able to get him transferred to the [facility] for care closer to home. She is stressed but feels she is managing well. Denies CP/palp/SOB/HA/congestion/fevers/GI or GU c/o. Taking meds as prescribed  Past Medical History:  Diagnosis Date  . Acute bronchitis 08/15/2015  . Anxiety   . Anxiety state 05/07/2014   Widowed in 2013 after caring for her husband with Lewy Body Dementia for 6 years   . Arrhythmia 01/25/2015   Per Dr. Jaynee Eagles, [facility]; hx PVC  . Arthritis    "knees" (11/07/2015)  . 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 Basal cell carcinoma of right ear 02/26/2015   Removed by Dr [place]  . Chronic back pain    "mid-back; stops at the very lowest part of my back" (11/07/2015)  . Colon polyp 09/03/2017  . Constipation 02/25/2016  . Depression   . Dysphagia   . Dyspnea   . Esophageal reflux    occ  . Fainting    fainted twice  . Family history of adverse reaction to anesthesia     "daughter gets bad PONV"  . Gallbladder problem   . Hashimoto's disease   . Heart murmur   . Hot flashes 05/27/2016  . Hyperlipidemia   . Hypothyroid   . Insomnia   . Joint pain   . Low ferritin 08/27/2015   "took supplements for awhile" (11/07/2015)  . Medicare annual wellness visit, subsequent 08/27/2015  . Menopause   . Migraine    "under control w/daily RX right now" (11/07/2015)  . Occipital neuralgia   . Osteopenia 02/26/2015  . Osteoporosis    osteopenia  . Preventative health care 08/27/2015  . Prolonged depressive reaction   . Swallowing difficulty   . Vitamin B12 deficiency 09/01/2016  . Vitamin D deficiency 03/02/2017    Past Surgical History:  Procedure Laterality Date  . APPENDECTOMY  11/07/2015  . BASAL CELL CARCINOMA EXCISION Right 02/26/2015   ear  . CHOLECYSTECTOMY OPEN  1974  . COLONOSCOPY  2006  . LAPAROSCOPIC APPENDECTOMY N/A 11/07/2015   Procedure: APPENDECTOMY LAPAROSCOPIC;  Surgeon: Georganna Skeans, MD;  Location: [facility];  Service: General;  Laterality: N/A;  . LAPAROSCOPIC INCISIONAL / UMBILICAL / [place]  11/07/2015   UHR  . Tooth implant     at least 5 years ago per pt  . TUBAL LIGATION  1973  . UMBILICAL HERNIA REPAIR N/A 11/07/2015   Procedure: LAPAROSCOPIC UMBILICAL HERNIA;  Surgeon: Georganna Skeans, MD;  Location: [facility];  Service: General;  Laterality: N/A;    Family History  Problem Relation Age of Onset  . Congestive Heart Failure Mother   . Hypertension Mother   . Hyperlipidemia Mother   . Heart disease Mother   . Obesity Mother   . Leukemia Father   . Obesity Father   . Kidney disease Brother   . Cancer Brother        stage 4 kidney cancer, metastatic  . Kidney cancer Brother   . Leukemia Maternal Aunt   . Congestive Heart Failure Maternal Grandmother   . Arthritis Sister   . Colon cancer Neg Hx     Social History   Socioeconomic History  . Marital status: Married    Spouse name: [name] Nolan "[address]  . Number of  children: 1  . Years of education: HS  . Highest education level: Not on file  Occupational History  . Occupation: Retired  Tobacco Use  . Smoking status: Never Smoker  . Smokeless tobacco: Never Used  Substance and Sexual Activity  . Alcohol use: No    Alcohol/week: 0.0 standard drinks  . Drug use: No  . Sexual activity: Yes    Birth control/protection: Post-menopausal    Comment: lives with husband, no dietary restrictions, avoids caffeine, bananas, dairy  Other Topics Concern  . Not on file  Social History Narrative   Patient is married [name] Nolan) and lives at home with her husband.   Patient has one child.   Patient has a high school education.   Patient is right-handed.   Caffeine Use: Occasionally   Social Determinants of Health   Financial Resource Strain: Low Risk   . Difficulty of Paying Living Expenses: Not hard at all  Food Insecurity: No Food Insecurity  . Worried About Charity fundraiser in the Last Year: Never true  . Ran Out of Food in the Last Year: Never true  Transportation Needs: No Transportation Needs  . Lack of Transportation (Medical): No  . Lack of Transportation (Non-Medical): No  Physical Activity:   . Days of Exercise per Week: Not on file  . Minutes of Exercise per Session: Not on file  Stress:   . Feeling of Stress : Not on file  Social Connections:   . Frequency of Communication with Friends and Family: Not on file  . Frequency of Social Gatherings with Friends and Family: Not on file  . Attends Religious Services: Not on file  . Active Member of Clubs or Organizations: Not on file  . Attends Archivist Meetings: Not on file  . Marital Status: Not on file  Intimate Partner Violence:   . Fear of Current or Ex-Partner: Not on file  . Emotionally Abused: Not on file  . Physically Abused: Not on file  . Sexually Abused: Not on file    Outpatient Medications Prior to Visit  Medication Sig Dispense Refill  . botulinum toxin Type  A (BOTOX) 100 units SOLR injection INJECT 155 UNITS  INTRAMUSCULARLY EVERY 3  MONTHS (GIVEN AT MD OFFICE, DISCARD UNUSED AFTER 1ST  USE) 2 each 3  . gabapentin (NEURONTIN) 300 MG capsule TAKE ONE TO THREE CAPSULES (300MG -900MG ) AT BEDTIME 270 capsule 4  . SYNTHROID 75 MCG tablet 1 tab po qac daily except on Sunday 1/2 tab    . venlafaxine XR (EFFEXOR-XR) 75 MG 24 hr capsule Take 1 capsule (75 mg total) by mouth daily with breakfast. 90 capsule 1  . ALPRAZolam (XANAX) 0.25 MG tablet  Take 1 tablet (0.25 mg total) by mouth 2 (two) times daily as needed for anxiety. 60 tablet 3  . Rimegepant Sulfate (NURTEC) 75 MG TBDP Take 75 mg by mouth daily as needed. For migraines. Take as close to onset of migraine as possible. One daily maximum. (Patient not taking: Reported on 02/28/2020) 10 tablet 6  . rizatriptan (MAXALT-MLT) 10 MG disintegrating tablet Take 1 tablet (10 mg total) by mouth as needed for migraine. May repeat in 2 hours if needed (Patient not taking: Reported on 02/28/2020) 15 tablet 11   No facility-administered medications prior to visit.    Allergies  Allergen Reactions  . Statins Other (See Comments)    Muscle pain  . Codeine Nausea And Vomiting    Review of Systems  Constitutional: Positive for malaise/fatigue. Negative for fever.  HENT: Negative for congestion.   Eyes: Negative for blurred vision.  Respiratory: Negative for shortness of breath.   Cardiovascular: Negative for chest pain, palpitations and leg swelling.  Gastrointestinal: Negative for abdominal pain, blood in stool and nausea.  Genitourinary: Negative for dysuria and frequency.  Musculoskeletal: Positive for back pain and falls.  Skin: Negative for rash.  Neurological: Positive for headaches. Negative for dizziness and loss of consciousness.  Endo/Heme/Allergies: Negative for environmental allergies.  Psychiatric/Behavioral: Positive for depression. The patient is nervous/anxious.        Objective:     Physical Exam unable to obtain via phone  There were no vitals taken for this visit. Wt Readings from Last 3 Encounters:  02/14/20 185 lb 12.8 oz (84.3 kg)  11/18/18 212 lb 6.4 oz (96.3 kg)  09/06/18 217 lb 6.4 oz (98.6 kg)    Diabetic Foot Exam - Simple   No data filed     Lab Results  Component Value Date   WBC 5.8 11/18/2018   HGB 13.7 11/18/2018   HCT 42.0 11/18/2018   PLT 271.0 11/18/2018   GLUCOSE 75 11/18/2018   CHOL 205 (H) 11/18/2018   TRIG 151.0 (H) 11/18/2018   HDL 51.20 11/18/2018   LDLCALC 124 (H) 11/18/2018   ALT 17 11/18/2018   AST 25 11/18/2018   NA 141 11/18/2018   K 4.2 11/18/2018   CL 104 11/18/2018   CREATININE 0.82 11/18/2018   BUN 17 11/18/2018   CO2 30 11/18/2018   TSH 0.13 (L) 11/18/2018   HGBA1C 5.4 11/18/2018    Lab Results  Component Value Date   TSH 0.13 (L) 11/18/2018   Lab Results  Component Value Date   WBC 5.8 11/18/2018   HGB 13.7 11/18/2018   HCT 42.0 11/18/2018   MCV 84.8 11/18/2018   PLT 271.0 11/18/2018   Lab Results  Component Value Date   NA 141 11/18/2018   K 4.2 11/18/2018   CO2 30 11/18/2018   GLUCOSE 75 11/18/2018   BUN 17 11/18/2018   CREATININE 0.82 11/18/2018   BILITOT 0.5 11/18/2018   ALKPHOS 104 11/18/2018   AST 25 11/18/2018   ALT 17 11/18/2018   PROT 6.9 11/18/2018   ALBUMIN 4.4 11/18/2018   CALCIUM 10.1 11/18/2018   ANIONGAP 8 11/06/2015   GFR 68.69 11/18/2018   Lab Results  Component Value Date   CHOL 205 (H) 11/18/2018   Lab Results  Component Value Date   HDL 51.20 11/18/2018   Lab Results  Component Value Date   LDLCALC 124 (H) 11/18/2018   Lab Results  Component Value Date   TRIG 151.0 (H) 11/18/2018   Lab Results  Component  Value Date   CHOLHDL 4 11/18/2018   Lab Results  Component Value Date   HGBA1C 5.4 11/18/2018       Assessment & Plan:   Problem List Items Addressed This Visit    Hyperlipidemia    Encouraged heart healthy diet, increase exercise, avoid trans  fats, consider a krill oil cap daily      Relevant Orders   Lipid panel   Hypothyroidism    On Levothyroxine, continue to monitor      Relevant Orders   TSH   Osteopenia    Encouraged to get adequate exercise, calcium and vitamin d intake      Depression with anxiety    She continues to struggle with her husband's worsening Lewy Body dementia. She feels the Venlafaxine is holding her steady and does not want an increase at this time. The Alprazolam at 0.25 mg was not helpful but higher doses have helped her in the past. Will increase to 0.5 mg to use prn and see if that helps.      Vitamin B12 deficiency    Supplement and monitor      Relevant Orders   Vitamin B12   Vitamin D deficiency    Supplement and monitor      Relevant Orders   VITAMIN D 25 Hydroxy (Vit-D Deficiency, Fractures)   Anemia    Increase leafy greens, consider increased lean red meat and using cast iron cookware. Continue to monitor, report any concerns      Relevant Orders   CBC   Insulin resistance    hgba1c acceptable, minimize simple carbs. Increase exercise as tolerated.       Relevant Orders   Hemoglobin A1c   Comprehensive metabolic panel   Candidal skin infection    Did not fully resolve with nystatin, clotrimazole or diflucan. It is better but not gone will try Ketoconazole cream      Relevant Medications   ketoconazole (NIZORAL) 2 % cream   Back pain    She is following with [facility] now and had an MRI last Friday has not heard the results yet.          I am having Olevia Nolan start on ketoconazole. I am also having her maintain her rizatriptan, gabapentin, Botox, Nurtec, venlafaxine XR, and Synthroid.  Meds ordered this encounter  Medications  . ketoconazole (NIZORAL) 2 % cream    Sig: Apply 1 application topically 2 (two) times daily.    Dispense:  60 g    Refill:  2     I discussed the assessment and treatment plan with the patient. The patient was  provided an opportunity to ask questions and all were answered. The patient agreed with the plan and demonstrated an understanding of the instructions.   The patient was advised to call back or seek an in-person evaluation if the symptoms worsen or if the condition fails to improve as anticipated.  I provided 25 minutes of non-face-to-face time during this encounter.   Penni Homans, MD

## 2020-07-17 NOTE — Assessment & Plan Note (Signed)
On Levothyroxine, continue to monitor 

## 2020-07-17 NOTE — Assessment & Plan Note (Signed)
She continues to struggle with her husband's worsening Lewy Body dementia. She feels the Venlafaxine is holding her steady and does not want an increase at this time. The Alprazolam at 0.25 mg was not helpful but higher doses have helped her in the past. Will increase to 0.5 mg to use prn and see if that helps.

## 2020-07-17 NOTE — Assessment & Plan Note (Signed)
Encouraged heart healthy diet, increase exercise, avoid trans fats, consider a krill oil cap daily 

## 2020-07-17 NOTE — Assessment & Plan Note (Signed)
hgba1c acceptable, minimize simple carbs. Increase exercise as tolerated.  

## 2020-07-17 NOTE — Assessment & Plan Note (Signed)
Encouraged to get adequate exercise, calcium and vitamin d intake 

## 2020-07-24 ENCOUNTER — Ambulatory Visit (INDEPENDENT_AMBULATORY_CARE_PROVIDER_SITE_OTHER): Payer: Medicare PPO | Admitting: Neurology

## 2020-07-24 DIAGNOSIS — G43719 Chronic migraine without aura, intractable, without status migrainosus: Secondary | ICD-10-CM | POA: Diagnosis not present

## 2020-07-24 NOTE — Progress Notes (Signed)
Botox- 200 units x 1 vial Lot: [id number] Expiration: 02/2023 NDC: 2763-9432-00  Bacteriostatic 0.9% Sodium Chloride- 67mL total Lot: [id number] Expiration: 02/0/2023 NDC: 6190-1222-41  Dx: H46.431 B/B

## 2020-07-24 NOTE — Progress Notes (Signed)
Consent Form Botulism Toxin Injection For Chronic Migraine  07/24/2020: Doing extremely well at least 70% improvemen tin migraine frequency. She is not a clencher, do not place masseters. [name] is in a geriatric psych facility in [place] and they are having a hard time placing him as [name] will not take him back. She has contacted her senator to try and get [name] back in [place]  Reviewed orally with patient, additionally signature is on file:  Botulism toxin has been approved by the Federal drug administration for treatment of chronic migraine. Botulism toxin does not cure chronic migraine and it may not be effective in some patients.  The administration of botulism toxin is accomplished by injecting a small amount of toxin into the muscles of the neck and head. Dosage must be titrated for each individual. Any benefits resulting from botulism toxin tend to wear off after 3 months with a repeat injection required if benefit is to be maintained. Injections are usually done every 3-4 months with maximum effect peak achieved by about 2 or 3 weeks. Botulism toxin is expensive and you should be sure of what costs you will incur resulting from the injection.  The side effects of botulism toxin use for chronic migraine may include:   -Transient, and usually mild, facial weakness with facial injections  -Transient, and usually mild, head or neck weakness with head/neck injections  -Reduction or loss of forehead facial animation due to forehead muscle weakness  -Eyelid drooping  -Dry eye  -Pain at the site of injection or bruising at the site of injection  -Double vision  -Potential unknown long term risks  Contraindications: You should not have Botox if you are pregnant, nursing, allergic to albumin, have an infection, skin condition, or muscle weakness at the site of the injection, or have myasthenia gravis, Lambert-Eaton syndrome, or ALS.  It is also possible that as with any  injection, there may be an allergic reaction or no effect from the medication. Reduced effectiveness after repeated injections is sometimes seen and rarely infection at the injection site may occur. All care will be taken to prevent these side effects. If therapy is given over a long time, atrophy and wasting in the muscle injected may occur. Occasionally the patient's become refractory to treatment because they develop antibodies to the toxin. In this event, therapy needs to be modified.  I have read the above information and consent to the administration of botulism toxin.    BOTOX PROCEDURE NOTE FOR MIGRAINE HEADACHE    Contraindications and precautions discussed with patient(above). Aseptic procedure was observed and patient tolerated procedure. Procedure performed by Dr. Georgia Dom  The condition has existed for more than 6 months, and pt does not have a diagnosis of ALS, Myasthenia Gravis or Lambert-Eaton Syndrome.  Risks and benefits of injections discussed and pt agrees to proceed with the procedure.  Written consent obtained  These injections are medically necessary. Pt  receives good benefits from these injections. These injections do not cause sedations or hallucinations which the oral therapies may cause.  Description of procedure:  The patient was placed in a sitting position. The standard protocol was used for Botox as follows, with 5 units of Botox injected at each site:   -Procerus muscle, midline injection  -Corrugator muscle, bilateral injection  -Frontalis muscle, bilateral injection, with 2 sites each side, medial injection was performed in the upper one third of the frontalis muscle, in the region vertical from the medial inferior edge of the  superior orbital rim. The lateral injection was again in the upper one third of the forehead vertically above the lateral limbus of the cornea, 1.5 cm lateral to the medial injection site.  -Temporalis muscle injection, 4 sites,  bilaterally. The first injection was 3 cm above the tragus of the ear, second injection site was 1.5 cm to 3 cm up from the first injection site in line with the tragus of the ear. The third injection site was 1.5-3 cm forward between the first 2 injection sites. The fourth injection site was 1.5 cm posterior to the second injection site.   -Occipitalis muscle injection, 3 sites, bilaterally. The first injection was done one half way between the occipital protuberance and the tip of the mastoid process behind the ear. The second injection site was done lateral and superior to the first, 1 fingerbreadth from the first injection. The third injection site was 1 fingerbreadth superiorly and medially from the first injection site.  -Cervical paraspinal muscle injection, 2 sites, bilateral knee first injection site was 1 cm from the midline of the cervical spine, 3 cm inferior to the lower border of the occipital protuberance. The second injection site was 1.5 cm superiorly and laterally to the first injection site.  -Trapezius muscle injection was performed at 3 sites, bilaterally. The first injection site was in the upper trapezius muscle halfway between the inflection point of the neck, and the acromion. The second injection site was one half way between the acromion and the first injection site. The third injection was done between the first injection site and the inflection point of the neck.   Will return for repeat injection in 3 months.   200 units of Botox was used, any Botox not injected was wasted. The patient tolerated the procedure well, there were no complications of the above procedure.

## 2020-07-27 ENCOUNTER — Encounter: Payer: Self-pay | Admitting: Family Medicine

## 2020-07-31 ENCOUNTER — Other Ambulatory Visit: Payer: Self-pay

## 2020-07-31 ENCOUNTER — Other Ambulatory Visit (INDEPENDENT_AMBULATORY_CARE_PROVIDER_SITE_OTHER): Payer: Medicare PPO

## 2020-07-31 DIAGNOSIS — D508 Other iron deficiency anemias: Secondary | ICD-10-CM

## 2020-07-31 DIAGNOSIS — E038 Other specified hypothyroidism: Secondary | ICD-10-CM | POA: Diagnosis not present

## 2020-07-31 DIAGNOSIS — E538 Deficiency of other specified B group vitamins: Secondary | ICD-10-CM

## 2020-07-31 DIAGNOSIS — E559 Vitamin D deficiency, unspecified: Secondary | ICD-10-CM | POA: Diagnosis not present

## 2020-07-31 DIAGNOSIS — E782 Mixed hyperlipidemia: Secondary | ICD-10-CM

## 2020-07-31 DIAGNOSIS — E8881 Metabolic syndrome: Secondary | ICD-10-CM

## 2020-07-31 DIAGNOSIS — E88819 Insulin resistance, unspecified: Secondary | ICD-10-CM

## 2020-07-31 NOTE — Addendum Note (Signed)
Addended by: Manuela Schwartz on: 07/31/2020 03:12 PM   Modules accepted: Orders

## 2020-08-01 DIAGNOSIS — M5136 Other intervertebral disc degeneration, lumbar region: Secondary | ICD-10-CM | POA: Diagnosis not present

## 2020-08-01 DIAGNOSIS — M5126 Other intervertebral disc displacement, lumbar region: Secondary | ICD-10-CM | POA: Diagnosis not present

## 2020-08-01 DIAGNOSIS — M545 Low back pain, unspecified: Secondary | ICD-10-CM | POA: Diagnosis not present

## 2020-08-01 LAB — COMPREHENSIVE METABOLIC PANEL
ALT: 14 U/L (ref 0–35)
AST: 23 U/L (ref 0–37)
Albumin: 4.1 g/dL (ref 3.5–5.2)
Alkaline Phosphatase: 90 U/L (ref 39–117)
BUN: 17 mg/dL (ref 6–23)
CO2: 29 mEq/L (ref 19–32)
Calcium: 9.9 mg/dL (ref 8.4–10.5)
Chloride: 105 mEq/L (ref 96–112)
Creatinine, Ser: 0.88 mg/dL (ref 0.40–1.20)
GFR: 65.45 mL/min (ref >60.00)
Glucose, Bld: 76 mg/dL (ref 70–99)
Potassium: 4.2 mEq/L (ref 3.5–5.1)
Sodium: 141 mEq/L (ref 135–145)
Total Bilirubin: 0.5 mg/dL (ref 0.2–1.2)
Total Protein: 6.7 g/dL (ref 6.0–8.3)

## 2020-08-01 LAB — CBC
HCT: 40.7 % (ref 36.0–46.0)
Hemoglobin: 13.9 g/dL (ref 12.0–15.0)
MCHC: 34.1 g/dL (ref 30.0–36.0)
MCV: 92.1 fl (ref 78.0–100.0)
Platelets: 253 10*3/uL (ref 150.0–400.0)
RBC: 4.41 Mil/uL (ref 3.87–5.11)
RDW: 13.4 % (ref 11.5–15.5)
WBC: 6 10*3/uL (ref 4.0–10.5)

## 2020-08-01 LAB — TSH: TSH: 0.98 u[IU]/mL (ref 0.35–4.50)

## 2020-08-01 LAB — VITAMIN B12: Vitamin B-12: 202 pg/mL — ABNORMAL LOW (ref 211–911)

## 2020-08-01 LAB — LIPID PANEL
Cholesterol: 222 mg/dL — ABNORMAL HIGH (ref 0–200)
HDL: 48.6 mg/dL (ref >39.00)
LDL Cholesterol: 139 mg/dL — ABNORMAL HIGH (ref 0–99)
NonHDL: 173.54
Total CHOL/HDL Ratio: 5
Triglycerides: 173 mg/dL — ABNORMAL HIGH (ref 0.0–149.0)
VLDL: 34.6 mg/dL (ref 0.0–40.0)

## 2020-08-01 LAB — HEMOGLOBIN A1C: Hgb A1c MFr Bld: 5.1 % (ref 4.6–6.5)

## 2020-08-01 LAB — VITAMIN D 25 HYDROXY (VIT D DEFICIENCY, FRACTURES): VITD: 17.81 ng/mL — ABNORMAL LOW (ref 30.00–100.00)

## 2020-08-02 ENCOUNTER — Other Ambulatory Visit: Payer: Self-pay | Admitting: Family Medicine

## 2020-08-02 ENCOUNTER — Other Ambulatory Visit: Payer: Medicare PPO

## 2020-08-02 DIAGNOSIS — E538 Deficiency of other specified B group vitamins: Secondary | ICD-10-CM | POA: Diagnosis not present

## 2020-08-02 DIAGNOSIS — M5136 Other intervertebral disc degeneration, lumbar region: Secondary | ICD-10-CM | POA: Insufficient documentation

## 2020-08-02 NOTE — Progress Notes (Signed)
Mosie Lukes, MD  Ronny Flurry, CMA Cc: Manuela Schwartz, CMA Can we add an intrinsic factor for low vitamin B12?

## 2020-08-07 LAB — INTRINSIC FACTOR ANTIBODIES: Intrinsic Factor: POSITIVE — AB

## 2020-08-08 ENCOUNTER — Other Ambulatory Visit: Payer: Self-pay | Admitting: *Deleted

## 2020-08-08 ENCOUNTER — Telehealth: Payer: Self-pay | Admitting: *Deleted

## 2020-08-08 ENCOUNTER — Encounter: Payer: Self-pay | Admitting: Family Medicine

## 2020-08-08 MED ORDER — COLESEVELAM HCL 625 MG PO TABS: 1875.0000 mg | ORAL_TABLET | Freq: Two times a day (BID) | ORAL | 3 refills | Status: DC

## 2020-08-08 MED ORDER — VITAMIN D (ERGOCALCIFEROL) 1.25 MG (50000 UNIT) PO CAPS: 50000.0000 [IU] | ORAL_CAPSULE | ORAL | 4 refills | Status: DC

## 2020-08-08 NOTE — Telephone Encounter (Signed)
Patient notified of results from 07/31/20.  She will start welchol and vit d 50000.  She did buy the otc vit d and will take that daily.  She will call back to schedule nurse visit for vit b12.

## 2020-08-08 NOTE — Telephone Encounter (Signed)
Patient was rescheduled for May 19 th at 1:20 pm.

## 2020-08-21 ENCOUNTER — Encounter: Payer: Self-pay | Admitting: Family Medicine

## 2020-08-22 ENCOUNTER — Ambulatory Visit (INDEPENDENT_AMBULATORY_CARE_PROVIDER_SITE_OTHER): Payer: Medicare PPO

## 2020-08-22 ENCOUNTER — Ambulatory Visit: Payer: Medicare PPO

## 2020-08-22 ENCOUNTER — Other Ambulatory Visit: Payer: Self-pay

## 2020-08-22 DIAGNOSIS — E538 Deficiency of other specified B group vitamins: Secondary | ICD-10-CM

## 2020-08-22 MED ORDER — CYANOCOBALAMIN 1000 MCG/ML IJ SOLN
1000.0000 ug | Freq: Once | INTRAMUSCULAR | Status: AC
  Administered 2020-08-22: 1000 ug via INTRAMUSCULAR

## 2020-08-22 NOTE — Progress Notes (Signed)
Pt here for monthly B12 injection per   B12 1034mcg given IM, and pt tolerated injection well.  Next B12 injection scheduled for 09/26/2020 @03  pm

## 2020-08-28 DIAGNOSIS — M5416 Radiculopathy, lumbar region: Secondary | ICD-10-CM | POA: Diagnosis not present

## 2020-09-09 ENCOUNTER — Other Ambulatory Visit: Payer: Self-pay | Admitting: Family Medicine

## 2020-09-25 DIAGNOSIS — M5459 Other low back pain: Secondary | ICD-10-CM | POA: Diagnosis not present

## 2020-09-25 DIAGNOSIS — M5136 Other intervertebral disc degeneration, lumbar region: Secondary | ICD-10-CM | POA: Diagnosis not present

## 2020-09-25 DIAGNOSIS — M545 Low back pain, unspecified: Secondary | ICD-10-CM | POA: Diagnosis not present

## 2020-09-26 ENCOUNTER — Other Ambulatory Visit: Payer: Self-pay

## 2020-09-26 ENCOUNTER — Ambulatory Visit (INDEPENDENT_AMBULATORY_CARE_PROVIDER_SITE_OTHER): Payer: Medicare PPO

## 2020-09-26 DIAGNOSIS — E538 Deficiency of other specified B group vitamins: Secondary | ICD-10-CM | POA: Diagnosis not present

## 2020-09-26 MED ORDER — CYANOCOBALAMIN 1000 MCG/ML IJ SOLN
1000.0000 ug | INTRAMUSCULAR | Status: DC
  Administered 2020-09-26: 1000 ug via INTRAMUSCULAR

## 2020-09-26 NOTE — Progress Notes (Signed)
Pt here for monthly B12 injection per   B12 1061mcg given left IM, and pt tolerated injection well.  Pt scheduled for next monthly injection for 10/26/20.

## 2020-09-26 NOTE — Addendum Note (Signed)
Addended by: Sanda Linger on: 09/26/2020 04:10 PM   Modules accepted: Orders

## 2020-10-08 ENCOUNTER — Telehealth: Payer: Self-pay | Admitting: Neurology

## 2020-10-08 NOTE — Telephone Encounter (Signed)
Patient has a Botox appointment on 2/15. Patient has [employer], previous PA for Botox expired 09/14/20 ([place] #[id number]). I filled new out new PA form and gave to MD to sign.

## 2020-10-09 NOTE — Telephone Encounter (Signed)
Received approval via fax from [facility]. [place] #[id number] (10/05/19- 09/14/21).

## 2020-10-09 NOTE — Telephone Encounter (Signed)
I faxed PA request to [facility] with notes.

## 2020-10-22 ENCOUNTER — Encounter: Payer: Self-pay | Admitting: Family Medicine

## 2020-10-26 ENCOUNTER — Ambulatory Visit: Payer: Medicare PPO

## 2020-10-30 ENCOUNTER — Other Ambulatory Visit: Payer: Self-pay | Admitting: Family Medicine

## 2020-10-30 ENCOUNTER — Ambulatory Visit (INDEPENDENT_AMBULATORY_CARE_PROVIDER_SITE_OTHER): Payer: Medicare PPO | Admitting: Neurology

## 2020-10-30 DIAGNOSIS — G43719 Chronic migraine without aura, intractable, without status migrainosus: Secondary | ICD-10-CM

## 2020-10-30 DIAGNOSIS — E538 Deficiency of other specified B group vitamins: Secondary | ICD-10-CM

## 2020-10-30 NOTE — Progress Notes (Signed)
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

## 2020-10-30 NOTE — Progress Notes (Signed)
Consent Form Botulism Toxin Injection For Chronic Migraine  10/30/2020: Doing extremely well at least 70% improvement continues in migraine frequency. She is not a clencher, do not place masseters. [name] is in a geriatric psych facility in [place].   Reviewed orally with patient, additionally signature is on file:  Botulism toxin has been approved by the Federal drug administration for treatment of chronic migraine. Botulism toxin does not cure chronic migraine and it may not be effective in some patients.  The administration of botulism toxin is accomplished by injecting a small amount of toxin into the muscles of the neck and head. Dosage must be titrated for each individual. Any benefits resulting from botulism toxin tend to wear off after 3 months with a repeat injection required if benefit is to be maintained. Injections are usually done every 3-4 months with maximum effect peak achieved by about 2 or 3 weeks. Botulism toxin is expensive and you should be sure of what costs you will incur resulting from the injection.  The side effects of botulism toxin use for chronic migraine may include:   -Transient, and usually mild, facial weakness with facial injections  -Transient, and usually mild, head or neck weakness with head/neck injections  -Reduction or loss of forehead facial animation due to forehead muscle weakness  -Eyelid drooping  -Dry eye  -Pain at the site of injection or bruising at the site of injection  -Double vision  -Potential unknown long term risks  Contraindications: You should not have Botox if you are pregnant, nursing, allergic to albumin, have an infection, skin condition, or muscle weakness at the site of the injection, or have myasthenia gravis, Lambert-Eaton syndrome, or ALS.  It is also possible that as with any injection, there may be an allergic reaction or no effect from the medication. Reduced effectiveness after repeated injections is sometimes seen and rarely  infection at the injection site may occur. All care will be taken to prevent these side effects. If therapy is given over a long time, atrophy and wasting in the muscle injected may occur. Occasionally the patient's become refractory to treatment because they develop antibodies to the toxin. In this event, therapy needs to be modified.  I have read the above information and consent to the administration of botulism toxin.    BOTOX PROCEDURE NOTE FOR MIGRAINE HEADACHE    Contraindications and precautions discussed with patient(above). Aseptic procedure was observed and patient tolerated procedure. Procedure performed by Dr. Georgia Dom  The condition has existed for more than 6 months, and pt does not have a diagnosis of ALS, Myasthenia Gravis or Lambert-Eaton Syndrome.  Risks and benefits of injections discussed and pt agrees to proceed with the procedure.  Written consent obtained  These injections are medically necessary. Pt  receives good benefits from these injections. These injections do not cause sedations or hallucinations which the oral therapies may cause.  Description of procedure:  The patient was placed in a sitting position. The standard protocol was used for Botox as follows, with 5 units of Botox injected at each site:   -Procerus muscle, midline injection  -Corrugator muscle, bilateral injection  -Frontalis muscle, bilateral injection, with 2 sites each side, medial injection was performed in the upper one third of the frontalis muscle, in the region vertical from the medial inferior edge of the superior orbital rim. The lateral injection was again in the upper one third of the forehead vertically above the lateral limbus of the cornea, 1.5 cm lateral to the  medial injection site.  -Temporalis muscle injection, 4 sites, bilaterally. The first injection was 3 cm above the tragus of the ear, second injection site was 1.5 cm to 3 cm up from the first injection site in line with  the tragus of the ear. The third injection site was 1.5-3 cm forward between the first 2 injection sites. The fourth injection site was 1.5 cm posterior to the second injection site.   -Occipitalis muscle injection, 3 sites, bilaterally. The first injection was done one half way between the occipital protuberance and the tip of the mastoid process behind the ear. The second injection site was done lateral and superior to the first, 1 fingerbreadth from the first injection. The third injection site was 1 fingerbreadth superiorly and medially from the first injection site.  -Cervical paraspinal muscle injection, 2 sites, bilateral knee first injection site was 1 cm from the midline of the cervical spine, 3 cm inferior to the lower border of the occipital protuberance. The second injection site was 1.5 cm superiorly and laterally to the first injection site.  -Trapezius muscle injection was performed at 3 sites, bilaterally. The first injection site was in the upper trapezius muscle halfway between the inflection point of the neck, and the acromion. The second injection site was one half way between the acromion and the first injection site. The third injection was done between the first injection site and the inflection point of the neck.   Will return for repeat injection in 3 months.   200 units of Botox was used, any Botox not injected was wasted. The patient tolerated the procedure well, there were no complications of the above procedure.

## 2020-10-31 ENCOUNTER — Ambulatory Visit: Payer: Medicare PPO

## 2020-10-31 ENCOUNTER — Other Ambulatory Visit (INDEPENDENT_AMBULATORY_CARE_PROVIDER_SITE_OTHER): Payer: Medicare PPO

## 2020-10-31 ENCOUNTER — Other Ambulatory Visit: Payer: Self-pay

## 2020-10-31 DIAGNOSIS — E538 Deficiency of other specified B group vitamins: Secondary | ICD-10-CM

## 2020-11-01 ENCOUNTER — Encounter: Payer: Self-pay | Admitting: Family Medicine

## 2020-11-01 LAB — VITAMIN B12: Vitamin B-12: 927 pg/mL — ABNORMAL HIGH (ref 211–911)

## 2020-11-02 LAB — INTRINSIC FACTOR ANTIBODIES: Intrinsic Factor: POSITIVE — AB

## 2020-11-04 ENCOUNTER — Other Ambulatory Visit: Payer: Self-pay | Admitting: Family Medicine

## 2020-11-26 MED ORDER — GABAPENTIN 300 MG PO CAPS: ORAL_CAPSULE | ORAL | 4 refills | Status: DC

## 2020-11-26 NOTE — Telephone Encounter (Signed)
Dr Jaynee Eagles provided v.o. to refill Gabapentin 300 mg prescription x 1 year. Refill sent to [facility] in [place].

## 2020-12-21 DIAGNOSIS — M5459 Other low back pain: Secondary | ICD-10-CM | POA: Diagnosis not present

## 2020-12-25 ENCOUNTER — Other Ambulatory Visit: Payer: Self-pay | Admitting: Family Medicine

## 2021-01-03 ENCOUNTER — Telehealth: Payer: Self-pay

## 2021-01-03 ENCOUNTER — Other Ambulatory Visit: Payer: Self-pay | Admitting: Specialist

## 2021-01-03 DIAGNOSIS — M545 Low back pain, unspecified: Secondary | ICD-10-CM

## 2021-01-03 DIAGNOSIS — G8929 Other chronic pain: Secondary | ICD-10-CM

## 2021-01-03 NOTE — Progress Notes (Signed)
Phone call to patient to verify medication list and allergies for myelogram procedure. Pt instructed to hold Maxalt and Effexor for 48hrs prior to myelogram appointment time and 24 hours after appointment. Pt also instructed to have a driver the day of the procedure, the procedure would take around 2 hours, and discharge instructions discussed. Pt verbalized understanding.

## 2021-01-09 ENCOUNTER — Ambulatory Visit
Admission: RE | Admit: 2021-01-09 | Discharge: 2021-01-09 | Disposition: A | Discharge status: Home / Self Care | Payer: Medicare PPO | Source: Ambulatory Visit | Attending: Specialist | Admitting: Specialist

## 2021-01-09 ENCOUNTER — Other Ambulatory Visit: Payer: Self-pay

## 2021-01-09 DIAGNOSIS — M48061 Spinal stenosis, lumbar region without neurogenic claudication: Secondary | ICD-10-CM | POA: Diagnosis not present

## 2021-01-09 DIAGNOSIS — M545 Low back pain, unspecified: Secondary | ICD-10-CM

## 2021-01-09 DIAGNOSIS — M5416 Radiculopathy, lumbar region: Secondary | ICD-10-CM | POA: Diagnosis not present

## 2021-01-09 DIAGNOSIS — M47816 Spondylosis without myelopathy or radiculopathy, lumbar region: Secondary | ICD-10-CM | POA: Diagnosis not present

## 2021-01-09 MED ORDER — IOPAMIDOL (ISOVUE-M 200) INJECTION 41%
18.0000 mL | Freq: Once | INTRAMUSCULAR | Status: AC
  Administered 2021-01-09: 18 mL via INTRATHECAL

## 2021-01-09 MED ORDER — IOPAMIDOL (ISOVUE-M 200) INJECTION 41%: 18.0000 mL | Freq: Once | INTRAMUSCULAR | Status: DC

## 2021-01-09 MED ORDER — DIAZEPAM 5 MG PO TABS
5.0000 mg | ORAL_TABLET | Freq: Once | ORAL | Status: AC
  Administered 2021-01-09: 5 mg via ORAL

## 2021-01-09 NOTE — Discharge Instructions (Signed)
Myelogram Discharge Instructions  1. Go home and rest quietly as needed. You may resume normal activities; however, do not exert yourself strongly or do any heavy lifting today and tomorrow.   2. DO NOT drive today.    3. You may resume your normal diet and medications unless otherwise indicated. Drink lots of extra fluids today and tomorrow.   4. The incidence of headache, nausea, or vomiting is about 5% (one in 20 patients).  If you develop a headache, lie flat for 24 hours and drink plenty of fluids until the headache goes away.  Caffeinated beverages may be helpful. If when you get up you still have a headache when standing, go back to bed and force fluids for another 24 hours.   5. If you develop severe nausea and vomiting or a headache that does not go away with the flat bedrest after 48 hours, please call [phone].   6. Call your physician for a follow-up appointment.  The results of your myelogram will be sent directly to your physician by the following day.  7. If you have any questions or if complications develop after you arrive home, please call [phone].  Discharge instructions have been explained to the patient.  The patient, or the person responsible for the patient, fully understands these instructions.   Thank you for visiting our office today.   YOU MAY RESUME YOUR MAXALT AND EFFEXOR TOMORROW 01/10/21 AT 10:30 AM

## 2021-01-09 NOTE — Progress Notes (Signed)
Pt reports she has been off of her Maxalt and Effexor for at least 48 hours.

## 2021-01-15 DIAGNOSIS — E063 Autoimmune thyroiditis: Secondary | ICD-10-CM | POA: Diagnosis not present

## 2021-01-15 DIAGNOSIS — E038 Other specified hypothyroidism: Secondary | ICD-10-CM | POA: Diagnosis not present

## 2021-01-22 DIAGNOSIS — M48061 Spinal stenosis, lumbar region without neurogenic claudication: Secondary | ICD-10-CM | POA: Diagnosis not present

## 2021-01-22 DIAGNOSIS — M545 Low back pain, unspecified: Secondary | ICD-10-CM | POA: Diagnosis not present

## 2021-01-24 ENCOUNTER — Ambulatory Visit: Payer: Medicare PPO | Admitting: Family Medicine

## 2021-01-24 DIAGNOSIS — E038 Other specified hypothyroidism: Secondary | ICD-10-CM | POA: Diagnosis not present

## 2021-01-24 DIAGNOSIS — E063 Autoimmune thyroiditis: Secondary | ICD-10-CM | POA: Diagnosis not present

## 2021-01-29 ENCOUNTER — Ambulatory Visit: Payer: Self-pay | Admitting: Neurology

## 2021-01-31 ENCOUNTER — Ambulatory Visit: Payer: Self-pay | Admitting: Neurology

## 2021-01-31 ENCOUNTER — Other Ambulatory Visit: Payer: Self-pay

## 2021-01-31 ENCOUNTER — Encounter: Payer: Self-pay | Admitting: Family Medicine

## 2021-01-31 ENCOUNTER — Ambulatory Visit (INDEPENDENT_AMBULATORY_CARE_PROVIDER_SITE_OTHER): Payer: Medicare PPO | Admitting: Family Medicine

## 2021-01-31 VITALS — BP 122/80 | HR 84 | Temp 98.4°F | Resp 16 | Ht 65.0 in | Wt 189.2 lb

## 2021-01-31 DIAGNOSIS — E782 Mixed hyperlipidemia: Secondary | ICD-10-CM | POA: Diagnosis not present

## 2021-01-31 DIAGNOSIS — E538 Deficiency of other specified B group vitamins: Secondary | ICD-10-CM | POA: Diagnosis not present

## 2021-01-31 DIAGNOSIS — G43009 Migraine without aura, not intractable, without status migrainosus: Secondary | ICD-10-CM | POA: Diagnosis not present

## 2021-01-31 DIAGNOSIS — E559 Vitamin D deficiency, unspecified: Secondary | ICD-10-CM

## 2021-01-31 DIAGNOSIS — E669 Obesity, unspecified: Secondary | ICD-10-CM | POA: Diagnosis not present

## 2021-01-31 DIAGNOSIS — E038 Other specified hypothyroidism: Secondary | ICD-10-CM

## 2021-01-31 DIAGNOSIS — Z0000 Encounter for general adult medical examination without abnormal findings: Secondary | ICD-10-CM

## 2021-01-31 DIAGNOSIS — M8580 Other specified disorders of bone density and structure, unspecified site: Secondary | ICD-10-CM | POA: Diagnosis not present

## 2021-01-31 DIAGNOSIS — M549 Dorsalgia, unspecified: Secondary | ICD-10-CM

## 2021-01-31 DIAGNOSIS — Z79899 Other long term (current) drug therapy: Secondary | ICD-10-CM

## 2021-01-31 DIAGNOSIS — E8881 Metabolic syndrome: Secondary | ICD-10-CM

## 2021-01-31 DIAGNOSIS — Z1231 Encounter for screening mammogram for malignant neoplasm of breast: Secondary | ICD-10-CM | POA: Diagnosis not present

## 2021-01-31 DIAGNOSIS — F418 Other specified anxiety disorders: Secondary | ICD-10-CM

## 2021-01-31 DIAGNOSIS — K635 Polyp of colon: Secondary | ICD-10-CM

## 2021-01-31 MED ORDER — ALPRAZOLAM 0.5 MG PO TABS: 0.5000 mg | ORAL_TABLET | Freq: Every evening | ORAL | 2 refills | Status: DC | PRN

## 2021-01-31 MED ORDER — VENLAFAXINE HCL ER 75 MG PO CP24: 75.0000 mg | ORAL_CAPSULE | Freq: Every day | ORAL | 1 refills | Status: DC

## 2021-01-31 NOTE — Patient Instructions (Addendum)
MIND diet for weight loss and longevity  Preventive Care 65 Years and Older, Female Preventive care refers to lifestyle choices and visits with your health care provider that can promote health and wellness. This includes:  A yearly physical exam. This is also called an annual wellness visit.  Regular dental and eye exams.  Immunizations.  Screening for certain conditions.  Healthy lifestyle choices, such as: ? Eating a healthy diet. ? Getting regular exercise. ? Not using drugs or products that contain nicotine and tobacco. ? Limiting alcohol use. What can I expect for my preventive care visit? Physical exam Your health care provider will check your:  Height and weight. These may be used to calculate your BMI (body mass index). BMI is a measurement that tells if you are at a healthy weight.  Heart rate and blood pressure.  Body temperature.  Skin for abnormal spots. Counseling Your health care provider may ask you questions about your:  Past medical problems.  Family's medical history.  Alcohol, tobacco, and drug use.  Emotional well-being.  Home life and relationship well-being.  Sexual activity.  Diet, exercise, and sleep habits.  History of falls.  Memory and ability to understand (cognition).  Work and work Statistician.  Pregnancy and menstrual history.  Access to firearms. What immunizations do I need? Vaccines are usually given at various ages, according to a schedule. Your health care provider will recommend vaccines for you based on your age, medical history, and lifestyle or other factors, such as travel or where you work.   What tests do I need? Blood tests  Lipid and cholesterol levels. These may be checked every 5 years, or more often depending on your overall health.  Hepatitis C test.  Hepatitis B test. Screening  Lung cancer screening. You may have this screening every year starting at age 17 if you have a 30-pack-year history of  smoking and currently smoke or have quit within the past 15 years.  Colorectal cancer screening. ? All adults should have this screening starting at age 76 and continuing until age 46. ? Your health care provider may recommend screening at age 24 if you are at increased risk. ? You will have tests every 1-10 years, depending on your results and the type of screening test.  Diabetes screening. ? This is done by checking your blood sugar (glucose) after you have not eaten for a while (fasting). ? You may have this done every 1-3 years.  Mammogram. ? This may be done every 1-2 years. ? Talk with your health care provider about how often you should have regular mammograms.  Abdominal aortic aneurysm (AAA) screening. You may need this if you are a current or former smoker.  BRCA-related cancer screening. This may be done if you have a family history of breast, ovarian, tubal, or peritoneal cancers. Other tests  STD (sexually transmitted disease) testing, if you are at risk.  Bone density scan. This is done to screen for osteoporosis. You may have this done starting at age 69. Talk with your health care provider about your test results, treatment options, and if necessary, the need for more tests. Follow these instructions at home: Eating and drinking  Eat a diet that includes fresh fruits and vegetables, whole grains, lean protein, and low-fat dairy products. Limit your intake of foods with high amounts of sugar, saturated fats, and salt.  Take vitamin and mineral supplements as recommended by your health care provider.  Do not drink alcohol if your health  care provider tells you not to drink.  If you drink alcohol: ? Limit how much you have to 0-1 drink a day. ? Be aware of how much alcohol is in your drink. In the [place] one drink equals one 12 oz bottle of beer (355 mL), one 5 oz glass of wine (148 mL), or one 1 oz glass of hard liquor (44 mL).   Lifestyle  Take daily care of your  teeth and gums. Brush your teeth every morning and night with fluoride toothpaste. Floss one time each day.  Stay active. Exercise for at least 30 minutes 5 or more days each week.  Do not use any products that contain nicotine or tobacco, such as cigarettes, e-cigarettes, and chewing tobacco. If you need help quitting, ask your health care provider.  Do not use drugs.  If you are sexually active, practice safe sex. Use a condom or other form of protection in order to prevent STIs (sexually transmitted infections).  Talk with your health care provider about taking a low-dose aspirin or statin.  Find healthy ways to cope with stress, such as: ? Meditation, yoga, or listening to music. ? Journaling. ? Talking to a trusted person. ? Spending time with friends and family. Safety  Always wear your seat belt while driving or riding in a vehicle.  Do not drive: ? If you have been drinking alcohol. Do not ride with someone who has been drinking. ? When you are tired or distracted. ? While texting.  Wear a helmet and other protective equipment during sports activities.  If you have firearms in your house, make sure you follow all gun safety procedures. What's next?  Visit your health care provider once a year for an annual wellness visit.  Ask your health care provider how often you should have your eyes and teeth checked.  Stay up to date on all vaccines. This information is not intended to replace advice given to you by your health care provider. Make sure you discuss any questions you have with your health care provider. Document Revised: 08/22/2020 Document Reviewed: 08/26/2018 Elsevier Patient Education  2021 [employer].

## 2021-01-31 NOTE — Assessment & Plan Note (Signed)
>>  ASSESSMENT AND PLAN FOR HYPOTHYROIDISM WRITTEN ON 01/31/2021  1:38 PM BY BLYTH, STACEY A, MD  On Levothyroxine, continue to monitor

## 2021-01-31 NOTE — Assessment & Plan Note (Addendum)
Patient encouraged to maintain heart healthy diet, regular exercise, adequate sleep. Consider daily probiotics. Take medications as prescribed. Labs ordered and reviewed. Referred for colonoscopy. MGM ordered.

## 2021-01-31 NOTE — Assessment & Plan Note (Signed)
>>  ASSESSMENT AND PLAN FOR MIGRAINE WITHOUT AURA WRITTEN ON 01/31/2021  1:39 PM BY Bradd Canary, MD  Follows with Dr Lucia Gaskins, has been doing well, no changes

## 2021-01-31 NOTE — Assessment & Plan Note (Signed)
>>  ASSESSMENT AND PLAN FOR OBESITY WRITTEN ON 01/31/2021  1:38 PM BY BLYTH, STACEY A, MD  Encouraged MIND diet, decrease po intake and increase exercise as tolerated. Needs 7-8 hours of sleep nightly. Avoid trans fats, eat small, frequent meals every 4-5 hours with lean proteins, complex carbs and healthy fats. Minimize simple carbs

## 2021-01-31 NOTE — Progress Notes (Signed)
Patient ID: [name] Nolan, female    DOB: [birth year]  Age: 75 y.o. MRN: [id number]    Subjective:  Subjective  HPI [name] Nolan presents for comprehensive physical exam today and follow up on management of chronic concerns of osteopenia and arthritis. She reports that she is planning on having back surgery which she states is for her pinched disc. She denies any chest pain, SOB, fever, abdominal pain, cough, chills, sore throat, dysuria, urinary incontinence, back pain, HA, or N/VD. She reports feeling pain down her right leg and back pain every day which she states takes her gabapentin, 2 tylenols, and 3 ibuprofen for. She reports that she was able to get down to 170 lbs from 220 lbs, but recently has gained 10 lbs back due to life stresses. She is trying to eat well and stay as active as she is able. Is taking meds as prescribed.   Review of Systems  Constitutional: Negative for chills, fatigue and fever.  HENT: Negative for congestion, rhinorrhea, sinus pressure, sinus pain and sore throat.   Eyes: Negative for pain.  Respiratory: Negative for cough and shortness of breath.   Cardiovascular: Negative for chest pain, palpitations and leg swelling.  Gastrointestinal: Negative for abdominal pain, blood in stool, diarrhea, nausea and vomiting.  Genitourinary: Negative for decreased urine volume, flank pain, frequency, vaginal bleeding and vaginal discharge.  Musculoskeletal: Positive for back pain.  Neurological: Negative for headaches.    History Past Medical History:  Diagnosis Date  . Acute bronchitis 08/15/2015  . Anxiety   . Anxiety state 05/07/2014   Widowed in 2013 after caring for her husband with Lewy Body Dementia for 6 years   . Arrhythmia 01/25/2015   Per Dr. Jaynee Eagles, [facility]; hx PVC  . Arthritis    "knees" (11/07/2015)  . 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 Basal cell carcinoma of right ear 02/26/2015   Removed by Dr [place]ble [place]  . Chronic  back pain    "mid-back; stops at the very lowest part of my back" (11/07/2015)  . Colon polyp 09/03/2017  . Constipation 02/25/2016  . Depression   . Dysphagia   . Dyspnea   . Esophageal reflux    occ  . Fainting    fainted twice  . Family history of adverse reaction to anesthesia    "daughter gets bad PONV"  . Gallbladder problem   . Hashimoto's disease   . Heart murmur   . Hot flashes 05/27/2016  . Hyperlipidemia   . Hypothyroid   . Insomnia   . Joint pain   . Low ferritin 08/27/2015   "took supplements for awhile" (11/07/2015)  . Medicare annual wellness visit, subsequent 08/27/2015  . Menopause   . Migraine    "under control w/daily RX right now" (11/07/2015)  . Occipital neuralgia   . Osteopenia 02/26/2015  . Osteoporosis    osteopenia  . Preventative health care 08/27/2015  . Prolonged depressive reaction   . Swallowing difficulty   . Vitamin B12 deficiency 09/01/2016  . Vitamin D deficiency 03/02/2017    She has a past surgical history that includes Tooth implant; Colonoscopy (2006); Appendectomy (11/07/2015); Laparoscopic incisional / umbilical / ventral hernia repair (11/07/2015); Cholecystectomy open (1974); Tubal ligation (1973); Excision basal cell carcinoma (Right, 02/26/2015); laparoscopic appendectomy (N/A, 8/41/3244); and Umbilical hernia repair (N/A, 11/07/2015).   Her family history includes Arthritis in her sister; Cancer in her brother; Congestive Heart Failure in her maternal grandmother and mother; Heart disease in her mother; Hyperlipidemia in her  mother; Hypertension in her mother; Kidney cancer in her brother; Kidney disease in her brother; Leukemia in her father and maternal aunt; Obesity in her father and mother.She reports that she has never smoked. She has never used smokeless tobacco. She reports that she does not drink alcohol and does not use drugs.  Current Outpatient Medications on File Prior to Visit  Medication Sig Dispense Refill  . botulinum  toxin Type A (BOTOX) 100 units SOLR injection INJECT 155 UNITS  INTRAMUSCULARLY EVERY 3  MONTHS (GIVEN AT MD OFFICE, DISCARD UNUSED AFTER 1ST  USE) 2 each 3  . gabapentin (NEURONTIN) 300 MG capsule TAKE ONE TO THREE CAPSULES (300MG -900MG ) AT BEDTIME 270 capsule 4  . ketoconazole (NIZORAL) 2 % cream Apply 1 application topically 2 (two) times daily. 60 g 2  . rizatriptan (MAXALT-MLT) 10 MG disintegrating tablet Take 1 tablet (10 mg total) by mouth as needed for migraine. May repeat in 2 hours if needed 15 tablet 11  . SYNTHROID 75 MCG tablet 1 tab po qac daily except on Sunday 1/2 tab    . Vitamin D, Ergocalciferol, (DRISDOL) 1.25 MG (50000 UNIT) CAPS capsule Take 1 capsule (50,000 Units total) by mouth every 7 (seven) days. (Patient not taking: No sig reported) 4 capsule 4   Current Facility-Administered Medications on File Prior to Visit  Medication Dose Route Frequency Provider Last Rate Last Admin  . cyanocobalamin ((VITAMIN B-12)) injection 1,000 mcg  1,000 mcg Intramuscular Q30 days Mosie Lukes, MD   1,000 mcg at 09/26/20 1609     Objective:  Objective  Physical Exam Constitutional:      General: She is not in acute distress.    Appearance: Normal appearance. She is not ill-appearing or toxic-appearing.  HENT:     Head: Normocephalic and atraumatic.     Right Ear: Tympanic membrane, ear canal and external ear normal. There is no impacted cerumen.     Left Ear: Tympanic membrane, ear canal and external ear normal. There is no impacted cerumen.     Nose: No congestion or rhinorrhea.  Eyes:     Extraocular Movements: Extraocular movements intact.     Right eye: No nystagmus.     Left eye: No nystagmus.     Pupils: Pupils are equal, round, and reactive to light.  Cardiovascular:     Rate and Rhythm: Normal rate and regular rhythm.     Pulses: Normal pulses.     Heart sounds: Normal heart sounds. No murmur heard.   Pulmonary:     Effort: Pulmonary effort is normal. No  respiratory distress.     Breath sounds: Normal breath sounds. No wheezing, rhonchi or rales.  Abdominal:     General: Bowel sounds are normal.     Palpations: Abdomen is soft. There is no mass.     Tenderness: There is no abdominal tenderness. There is no guarding.     Hernia: No hernia is present.  Musculoskeletal:        General: Normal range of motion.     Cervical back: Normal range of motion and neck supple.  Skin:    General: Skin is warm and dry.  Neurological:     Mental Status: She is alert and oriented to person, place, and time.  Psychiatric:        Behavior: Behavior normal.    BP 122/80   Pulse 84   Temp 98.4 F (36.9 C)   Resp 16   Ht 5\' 5"  (1.651 m)  Wt 189 lb 3.2 oz (85.8 kg)   SpO2 96%   BMI 31.48 kg/m  Wt Readings from Last 3 Encounters:  01/31/21 189 lb 3.2 oz (85.8 kg)  02/14/20 185 lb 12.8 oz (84.3 kg)  11/18/18 212 lb 6.4 oz (96.3 kg)     Lab Results  Component Value Date   WBC 5.3 01/31/2021   HGB 14.6 01/31/2021   HCT 43.5 01/31/2021   PLT 283.0 01/31/2021   GLUCOSE 80 01/31/2021   CHOL 258 (H) 01/31/2021   TRIG 151.0 (H) 01/31/2021   HDL 58.40 01/31/2021   LDLCALC 169 (H) 01/31/2021   ALT 16 01/31/2021   AST 25 01/31/2021   NA 141 01/31/2021   K 4.5 01/31/2021   CL 103 01/31/2021   CREATININE 1.05 01/31/2021   BUN 13 01/31/2021   CO2 30 01/31/2021   TSH 1.13 01/31/2021   HGBA1C 5.4 01/31/2021    CT LUMBAR SPINE W CONTRAST  Result Date: 01/09/2021 CLINICAL DATA:  Chronic bilateral low back and buttock pain radiating down the right leg. No prior surgery. EXAM: LUMBAR MYELOGRAM CT LUMBAR MYELOGRAM FLUOROSCOPY TIME:  Radiation Exposure Index (as provided by the fluoroscopic device): 17.5 mGy Fluoroscopy Time:  43 seconds Number of Acquired Images:  20 PROCEDURE: After thorough discussion of risks and benefits of the procedure including bleeding, infection, injury to nerves, blood vessels, adjacent structures as well as headache and  CSF leak, written and oral informed consent was obtained. Consent was obtained by Dr. Fabiola Backer. Time out form was completed. Patient was positioned prone on the fluoroscopy table. Local anesthesia was provided with 1% lidocaine without epinephrine after prepped and draped in the usual sterile fashion. Puncture was performed at L3-L4 using a 5 inch 22-gauge spinal needle via right interlaminar approach. Using a single pass through the dura, the needle was placed within the thecal sac, with return of clear CSF. 15 mL of Isovue M-200 was injected into the thecal sac, with normal opacification of the nerve roots and cauda equina consistent with free flow within the subarachnoid space. I personally performed the lumbar puncture and administered the intrathecal contrast. I also personally supervised acquisition of the myelogram images. TECHNIQUE: Contiguous axial images were obtained through the lumbar spine after the intrathecal infusion of contrast. Coronal and sagittal reconstructions were obtained of the axial image sets. COMPARISON:  CT abdomen pelvis dated October 22, 2015. FINDINGS: LUMBAR MYELOGRAM FINDINGS: Normal alignment. No dynamic instability. Small ventral extradural defects at L3-L4 and L4-L5 which increase with standing and extension, contributing to mild spinal canal stenosis at both levels. Spinal canal narrowing improves with flexion. There is also mild lateral recess stenosis at L4-L5, which worsens on the right when bending to the right. CT LUMBAR MYELOGRAM FINDINGS: Segmentation: Standard. Alignment: Physiologic. Vertebrae: No acute fracture or other focal pathologic process. Conus medullaris and cauda equina: Conus extends to the L2 level. Conus and cauda equina appear normal. Small amount subdural contrast posteriorly at L1 and L2. Paraspinal and other soft tissues: Aortoiliac atherosclerotic vascular disease. Disc levels: T12-L1:  Negative. L1-L2:  Mild disc bulging and facet arthropathy.   No stenosis. L2-L3: Mild disc bulging and bilateral facet arthropathy. No stenosis. L3-L4: Mild disc bulging. Moderate right and mild left facet arthropathy. Mild spinal canal and bilateral lateral recess stenosis. No neuroforaminal stenosis. L4-L5: Mild disc bulging with superimposed small central calcified disc protrusion. Mild-to-moderate bilateral facet arthropathy. Mild spinal canal and right greater than left lateral recess stenosis. Mild right neuroforaminal stenosis. No  left neuroforaminal stenosis. L5-S1: Mild disc bulging and moderate bilateral facet arthropathy. Mild bilateral lateral recess stenosis. No spinal canal or neuroforaminal stenosis. IMPRESSION: 1. Mild multilevel lumbar spondylosis as described above. Mild spinal canal and lateral recess stenosis at L3-L4 and L4-L5, which worsens with extension. Right lateral recess stenosis at L4-L5 also worsens when bending to the right. 2. Aortic Atherosclerosis (ICD10-I70.0). Electronically Signed   By: Titus Dubin M.D.   On: 01/09/2021 14:01   DG MYELOGRAPHY LUMBAR INJ LUMBOSACRAL  Result Date: 01/09/2021 CLINICAL DATA:  Chronic bilateral low back and buttock pain radiating down the right leg. No prior surgery. EXAM: LUMBAR MYELOGRAM CT LUMBAR MYELOGRAM FLUOROSCOPY TIME:  Radiation Exposure Index (as provided by the fluoroscopic device): 17.5 mGy Fluoroscopy Time:  43 seconds Number of Acquired Images:  20 PROCEDURE: After thorough discussion of risks and benefits of the procedure including bleeding, infection, injury to nerves, blood vessels, adjacent structures as well as headache and CSF leak, written and oral informed consent was obtained. Consent was obtained by Dr. Fabiola Backer. Time out form was completed. Patient was positioned prone on the fluoroscopy table. Local anesthesia was provided with 1% lidocaine without epinephrine after prepped and draped in the usual sterile fashion. Puncture was performed at L3-L4 using a 5 inch 22-gauge  spinal needle via right interlaminar approach. Using a single pass through the dura, the needle was placed within the thecal sac, with return of clear CSF. 15 mL of Isovue M-200 was injected into the thecal sac, with normal opacification of the nerve roots and cauda equina consistent with free flow within the subarachnoid space. I personally performed the lumbar puncture and administered the intrathecal contrast. I also personally supervised acquisition of the myelogram images. TECHNIQUE: Contiguous axial images were obtained through the lumbar spine after the intrathecal infusion of contrast. Coronal and sagittal reconstructions were obtained of the axial image sets. COMPARISON:  CT abdomen pelvis dated October 22, 2015. FINDINGS: LUMBAR MYELOGRAM FINDINGS: Normal alignment. No dynamic instability. Small ventral extradural defects at L3-L4 and L4-L5 which increase with standing and extension, contributing to mild spinal canal stenosis at both levels. Spinal canal narrowing improves with flexion. There is also mild lateral recess stenosis at L4-L5, which worsens on the right when bending to the right. CT LUMBAR MYELOGRAM FINDINGS: Segmentation: Standard. Alignment: Physiologic. Vertebrae: No acute fracture or other focal pathologic process. Conus medullaris and cauda equina: Conus extends to the L2 level. Conus and cauda equina appear normal. Small amount subdural contrast posteriorly at L1 and L2. Paraspinal and other soft tissues: Aortoiliac atherosclerotic vascular disease. Disc levels: T12-L1:  Negative. L1-L2:  Mild disc bulging and facet arthropathy.  No stenosis. L2-L3: Mild disc bulging and bilateral facet arthropathy. No stenosis. L3-L4: Mild disc bulging. Moderate right and mild left facet arthropathy. Mild spinal canal and bilateral lateral recess stenosis. No neuroforaminal stenosis. L4-L5: Mild disc bulging with superimposed small central calcified disc protrusion. Mild-to-moderate bilateral facet  arthropathy. Mild spinal canal and right greater than left lateral recess stenosis. Mild right neuroforaminal stenosis. No left neuroforaminal stenosis. L5-S1: Mild disc bulging and moderate bilateral facet arthropathy. Mild bilateral lateral recess stenosis. No spinal canal or neuroforaminal stenosis. IMPRESSION: 1. Mild multilevel lumbar spondylosis as described above. Mild spinal canal and lateral recess stenosis at L3-L4 and L4-L5, which worsens with extension. Right lateral recess stenosis at L4-L5 also worsens when bending to the right. 2. Aortic Atherosclerosis (ICD10-I70.0). Electronically Signed   By: Titus Dubin M.D.   On: 01/09/2021 14:01  Assessment & Plan:  Plan    Meds ordered this encounter  Medications  . ALPRAZolam (XANAX) 0.5 MG tablet    Sig: Take 1 tablet (0.5 mg total) by mouth at bedtime as needed for anxiety.    Dispense:  30 tablet    Refill:  2  . venlafaxine XR (EFFEXOR-XR) 75 MG 24 hr capsule    Sig: Take 1 capsule (75 mg total) by mouth daily with breakfast.    Dispense:  90 capsule    Refill:  1    Problem List Items Addressed This Visit    Migraine without aura    Follows with Dr Jaynee Eagles, has been doing well, no changes      Relevant Medications   venlafaxine XR (EFFEXOR-XR) 75 MG 24 hr capsule   Other Relevant Orders   CBC (Completed)   Hyperlipidemia    Encouraged heart healthy diet, increase exercise, avoid trans fats, consider a krill oil cap daily      Relevant Orders   Comprehensive metabolic panel (Completed)   Lipid panel (Completed)   Hypothyroidism    On Levothyroxine, continue to monitor      Relevant Orders   TSH (Completed)   Obesity    Encouraged MIND diet, decrease po intake and increase exercise as tolerated. Needs 7-8 hours of sleep nightly. Avoid trans fats, eat small, frequent meals every 4-5 hours with lean proteins, complex carbs and healthy fats. Minimize simple carbs      Osteopenia    Encouraged to get adequate  exercise, calcium and vitamin d intake      Preventative health care    Patient encouraged to maintain heart healthy diet, regular exercise, adequate sleep. Consider daily probiotics. Take medications as prescribed. Labs ordered and reviewed. Referred for colonoscopy. MGM ordered.      Depression with anxiety    Doing well on Venlafaxine and Alprazolam despite her husband being in a nursing home over an hour away. No changes. UDS ordered.       Relevant Medications   ALPRAZolam (XANAX) 0.5 MG tablet   venlafaxine XR (EFFEXOR-XR) 75 MG 24 hr capsule   Vitamin B12 deficiency    Supplement and monitor      Relevant Orders   Vitamin B12 (Completed)   Vitamin D deficiency    Supplement and monitor      Relevant Orders   VITAMIN D 25 Hydroxy (Vit-D Deficiency, Fractures) (Completed)   Colon polyp    Referred to gastroenterology for further evaluation and ongoing surveillance.       Relevant Orders   Ambulatory referral to Gastroenterology   Insulin resistance    hgba1c acceptable, minimize simple carbs. Increase exercise as tolerated.       Relevant Orders   Hemoglobin A1c (Completed)   Back pain    She is having daily back pain and is managing by alternating Ibuprofen and Tylenol and taking Gabapentin but it can be debilitating so she is planing on surgery with Dr Tonita Cong of [facility]. She has some radicular symptoms into the right leg and she is hoping surgery will help.        Other Visit Diagnoses    Encounter for screening mammogram for malignant neoplasm of breast    -  Primary   Relevant Orders   MM 3D SCREEN BREAST BILATERAL   High risk medication use       Relevant Orders   DRUG MONITORING, PANEL 8 WITH CONFIRMATION, URINE  Follow-up: Return in about 6 months (around 08/03/2021). Clarise Cruz Hanna,acting as a scribe for Penni Homans, MD.,have documented all relevant documentation on the behalf of Penni Homans, MD,as directed by  Penni Homans, MD while  in the presence of Penni Homans, MD.  I, Mosie Lukes, MD personally performed the services described in this documentation. All medical record entries made by the scribe were at my direction and in my presence. I have reviewed the chart and agree that the record reflects my personal performance and is accurate and complete

## 2021-01-31 NOTE — Assessment & Plan Note (Signed)
On Levothyroxine, continue to monitor 

## 2021-01-31 NOTE — Assessment & Plan Note (Signed)
Encouraged to get adequate exercise, calcium and vitamin d intake 

## 2021-01-31 NOTE — Assessment & Plan Note (Signed)
Encouraged heart healthy diet, increase exercise, avoid trans fats, consider a krill oil cap daily 

## 2021-01-31 NOTE — Assessment & Plan Note (Signed)
hgba1c acceptable, minimize simple carbs. Increase exercise as tolerated.  

## 2021-01-31 NOTE — Assessment & Plan Note (Signed)
Encouraged MIND diet, decrease po intake and increase exercise as tolerated. Needs 7-8 hours of sleep nightly. Avoid trans fats, eat small, frequent meals every 4-5 hours with lean proteins, complex carbs and healthy fats. Minimize simple carbs

## 2021-01-31 NOTE — Assessment & Plan Note (Signed)
Supplement and monitor 

## 2021-01-31 NOTE — Assessment & Plan Note (Signed)
Follows with Dr Jaynee Eagles, has been doing well, no changes

## 2021-02-01 LAB — COMPREHENSIVE METABOLIC PANEL
ALT: 16 U/L (ref 0–35)
AST: 25 U/L (ref 0–37)
Albumin: 4.5 g/dL (ref 3.5–5.2)
Alkaline Phosphatase: 91 U/L (ref 39–117)
BUN: 13 mg/dL (ref 6–23)
CO2: 30 mEq/L (ref 19–32)
Calcium: 10.2 mg/dL (ref 8.4–10.5)
Chloride: 103 mEq/L (ref 96–112)
Creatinine, Ser: 1.05 mg/dL (ref 0.40–1.20)
GFR: 52.76 mL/min — ABNORMAL LOW (ref >60.00)
Glucose, Bld: 80 mg/dL (ref 70–99)
Potassium: 4.5 mEq/L (ref 3.5–5.1)
Sodium: 141 mEq/L (ref 135–145)
Total Bilirubin: 0.6 mg/dL (ref 0.2–1.2)
Total Protein: 7.2 g/dL (ref 6.0–8.3)

## 2021-02-01 LAB — HEMOGLOBIN A1C: Hgb A1c MFr Bld: 5.4 % (ref 4.6–6.5)

## 2021-02-01 LAB — LIPID PANEL
Cholesterol: 258 mg/dL — ABNORMAL HIGH (ref 0–200)
HDL: 58.4 mg/dL (ref >39.00)
LDL Cholesterol: 169 mg/dL — ABNORMAL HIGH (ref 0–99)
NonHDL: 199.39
Total CHOL/HDL Ratio: 4
Triglycerides: 151 mg/dL — ABNORMAL HIGH (ref 0.0–149.0)
VLDL: 30.2 mg/dL (ref 0.0–40.0)

## 2021-02-01 LAB — CBC
HCT: 43.5 % (ref 36.0–46.0)
Hemoglobin: 14.6 g/dL (ref 12.0–15.0)
MCHC: 33.4 g/dL (ref 30.0–36.0)
MCV: 91.2 fl (ref 78.0–100.0)
Platelets: 283 10*3/uL (ref 150.0–400.0)
RBC: 4.78 Mil/uL (ref 3.87–5.11)
RDW: 13.6 % (ref 11.5–15.5)
WBC: 5.3 10*3/uL (ref 4.0–10.5)

## 2021-02-01 LAB — VITAMIN D 25 HYDROXY (VIT D DEFICIENCY, FRACTURES): VITD: 41.39 ng/mL (ref 30.00–100.00)

## 2021-02-01 LAB — VITAMIN B12: Vitamin B-12: 440 pg/mL (ref 211–911)

## 2021-02-01 LAB — TSH: TSH: 1.13 u[IU]/mL (ref 0.35–4.50)

## 2021-02-02 ENCOUNTER — Other Ambulatory Visit: Payer: Self-pay | Admitting: Family Medicine

## 2021-02-02 DIAGNOSIS — Z789 Other specified health status: Secondary | ICD-10-CM

## 2021-02-02 DIAGNOSIS — E782 Mixed hyperlipidemia: Secondary | ICD-10-CM

## 2021-02-02 LAB — DRUG MONITORING, PANEL 8 WITH CONFIRMATION, URINE
6 Acetylmorphine: NEGATIVE ng/mL (ref <10)
Alcohol Metabolites: NEGATIVE ng/mL
Alphahydroxyalprazolam: NEGATIVE ng/mL (ref <25)
Alphahydroxymidazolam: NEGATIVE ng/mL (ref <50)
Alphahydroxytriazolam: NEGATIVE ng/mL (ref <50)
Aminoclonazepam: NEGATIVE ng/mL (ref <25)
Amphetamines: NEGATIVE ng/mL (ref <500)
Benzodiazepines: POSITIVE ng/mL — AB (ref <100)
Buprenorphine, Urine: NEGATIVE ng/mL (ref <5)
Cocaine Metabolite: NEGATIVE ng/mL (ref <150)
Creatinine: 240.4 mg/dL
Hydroxyethylflurazepam: NEGATIVE ng/mL (ref <50)
Lorazepam: NEGATIVE ng/mL (ref <50)
MDMA: NEGATIVE ng/mL (ref <500)
Marijuana Metabolite: NEGATIVE ng/mL (ref <20)
Nordiazepam: NEGATIVE ng/mL (ref <50)
Opiates: NEGATIVE ng/mL (ref <100)
Oxazepam: 50 ng/mL — ABNORMAL HIGH (ref <50)
Oxidant: NEGATIVE ug/mL
Oxycodone: NEGATIVE ng/mL (ref <100)
Temazepam: NEGATIVE ng/mL (ref <50)
pH: 6 (ref 4.5–9.0)

## 2021-02-02 LAB — DM TEMPLATE

## 2021-02-02 NOTE — Assessment & Plan Note (Signed)
>>  ASSESSMENT AND PLAN FOR LUMBAR PAIN WRITTEN ON 02/02/2021  3:39 PM BY BLYTH, STACEY A, MD  She is having daily back pain and is managing by alternating Ibuprofen and Tylenol and taking Gabapentin but it can be debilitating so she is planing on surgery with Dr Shelle Iron of [facility]. She has some radicular symptoms into the right leg and she is hoping surgery will help.

## 2021-02-02 NOTE — Assessment & Plan Note (Signed)
Doing well on Venlafaxine and Alprazolam despite her husband being in a nursing home over an hour away. No changes. UDS ordered.

## 2021-02-02 NOTE — Assessment & Plan Note (Signed)
Referred to gastroenterology for further evaluation and ongoing surveillance.

## 2021-02-02 NOTE — Assessment & Plan Note (Signed)
She is having daily back pain and is managing by alternating Ibuprofen and Tylenol and taking Gabapentin but it can be debilitating so she is planing on surgery with Dr Tonita Cong of [facility]. She has some radicular symptoms into the right leg and she is hoping surgery will help.

## 2021-02-05 ENCOUNTER — Ambulatory Visit (HOSPITAL_BASED_OUTPATIENT_CLINIC_OR_DEPARTMENT_OTHER)
Admission: RE | Admit: 2021-02-05 | Discharge: 2021-02-05 | Disposition: A | Discharge status: Home / Self Care | Payer: Medicare PPO | Source: Ambulatory Visit | Attending: Family Medicine | Admitting: Family Medicine

## 2021-02-05 ENCOUNTER — Other Ambulatory Visit: Payer: Self-pay

## 2021-02-05 DIAGNOSIS — Z1231 Encounter for screening mammogram for malignant neoplasm of breast: Secondary | ICD-10-CM | POA: Insufficient documentation

## 2021-02-20 ENCOUNTER — Ambulatory Visit: Payer: Self-pay | Admitting: Orthopedic Surgery

## 2021-02-20 NOTE — H&P (Signed)
[name] Nolan is an 75 y.o. female.   Chief Complaint: back and leg pain HPI: Reason for Visit: (normal) review of test results (lumbar CT/Myelogram) Context: fall; 14 1/2 months Location (Lower Extremity): lower back pain Severity: pain level 5/10 Quality: aching Aggravating Factors: worse with increased activity Associated Symptoms: weakness (RLE) Medications: Tylenol Still having right leg pain that radiates to the outer aspect of her calf and foot mainly when she is walking and is limiting her ambulatory capacity.  Past Medical History:  Diagnosis Date  . Acute bronchitis 08/15/2015  . Anxiety   . Anxiety state 05/07/2014   Widowed in 2013 after caring for her husband with Lewy Body Dementia for 6 years   . Arrhythmia 01/25/2015   Per Dr. Jaynee Eagles, [facility]; hx PVC  . Arthritis    "knees" (11/07/2015)  . 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 Basal cell carcinoma of right ear 02/26/2015   Removed by Dr Syble Creek  . Chronic back pain    "mid-back; stops at the very lowest part of my back" (11/07/2015)  . Colon polyp 09/03/2017  . Constipation 02/25/2016  . Depression   . Dysphagia   . Dyspnea   . Esophageal reflux    occ  . Fainting    fainted twice  . Family history of adverse reaction to anesthesia    "daughter gets bad PONV"  . Gallbladder problem   . Hashimoto's disease   . Heart murmur   . Hot flashes 05/27/2016  . Hyperlipidemia   . Hypothyroid   . Insomnia   . Joint pain   . Low ferritin 08/27/2015   "took supplements for awhile" (11/07/2015)  . Medicare annual wellness visit, subsequent 08/27/2015  . Menopause   . Migraine    "under control w/daily RX right now" (11/07/2015)  . Occipital neuralgia   . Osteopenia 02/26/2015  . Osteoporosis    osteopenia  . Preventative health care 08/27/2015  . Prolonged depressive reaction   . Swallowing difficulty   . Vitamin B12 deficiency 09/01/2016  . Vitamin D deficiency 03/02/2017    Past  Surgical History:  Procedure Laterality Date  . APPENDECTOMY  11/07/2015  . BASAL CELL CARCINOMA EXCISION Right 02/26/2015   ear  . CHOLECYSTECTOMY OPEN  1974  . COLONOSCOPY  2006  . LAPAROSCOPIC APPENDECTOMY N/A 11/07/2015   Procedure: APPENDECTOMY LAPAROSCOPIC;  Surgeon: Georganna Skeans, MD;  Location: [facility];  Service: General;  Laterality: N/A;  . LAPAROSCOPIC INCISIONAL / UMBILICAL / [place]  11/07/2015   UHR  . Tooth implant     at least 5 years ago per pt  . TUBAL LIGATION  1973  . UMBILICAL HERNIA REPAIR N/A 11/07/2015   Procedure: LAPAROSCOPIC UMBILICAL HERNIA;  Surgeon: Georganna Skeans, MD;  Location: [facility];  Service: General;  Laterality: N/A;    Family History  Problem Relation Age of Onset  . Congestive Heart Failure Mother   . Hypertension Mother   . Hyperlipidemia Mother   . Heart disease Mother   . Obesity Mother   . Leukemia Father   . Obesity Father   . Kidney disease Brother   . Cancer Brother        stage 4 kidney cancer, metastatic  . Kidney cancer Brother   . Leukemia Maternal Aunt   . Congestive Heart Failure Maternal Grandmother   . Arthritis Sister   . Colon cancer Neg Hx    Social History:  reports that she has never smoked. She has never  used smokeless tobacco. She reports that she does not drink alcohol and does not use drugs.  Allergies:  Allergies  Allergen Reactions  . Statins Other (See Comments)    Muscle pain  . Codeine Nausea And Vomiting   Medications: ALPRAZolam 0.5 mg tablet gabapentin 300 mg capsule ibuprofen Synthroid 75 mcg tablet venlafaxine ER 75 mg capsule,extended release 24 hr  Review of Systems  Constitutional: Negative.   HENT: Negative.   Eyes: Negative.   Respiratory: Negative.   Cardiovascular: Negative.   Gastrointestinal: Negative.   Genitourinary: Negative.   Musculoskeletal: Positive for back pain.  Skin: Negative.   Neurological: Positive for weakness and numbness.    There were no vitals  taken for this visit. Physical Exam Constitutional:      Appearance: Normal appearance.  HENT:     Head: Normocephalic.     Right Ear: External ear normal.     Left Ear: External ear normal.     Nose: Nose normal.     Mouth/Throat:     Pharynx: Oropharynx is clear.  Eyes:     Conjunctiva/sclera: Conjunctivae normal.  Cardiovascular:     Rate and Rhythm: Normal rate and regular rhythm.     Pulses: Normal pulses.     Heart sounds: Normal heart sounds.  Pulmonary:     Effort: Pulmonary effort is normal.     Breath sounds: Normal breath sounds.  Abdominal:     General: Bowel sounds are normal.  Musculoskeletal:     Cervical back: Normal range of motion.     Comments: Gait and Station: Appearance: ambulating with no assistive devices and antalgic gait.  Constitutional: General Appearance: healthy-appearing and distress (mild).  Psychiatric: Mood and Affect: active and alert.  Cardiovascular System: Edema Right: none; Dorsalis and posterior tibial pulses 2+. Edema Left: none.  Abdomen: Inspection and Palpation: non-distended and no tenderness.  Skin: Inspection and palpation: no rash.  Lumbar Spine: Inspection: normal alignment. Bony Palpation of the Lumbar Spine: tender at lumbosacral junction.. Bony Palpation of the Right Hip: no tenderness of the greater trochanter and tenderness of the SI joint; Pelvis stable. Bony Palpation of the Left Hip: no tenderness of the greater trochanter and tenderness of the SI joint. Soft Tissue Palpation on the Right: No flank pain with percussion. Active Range of Motion: limited flexion and extention.  Motor Strength: L1 Motor Strength on the Right: hip flexion iliopsoas 5/5. L1 Motor Strength on the Left: hip flexion iliopsoas 5/5. L2-L4 Motor Strength on the Right: knee extension quadriceps 5/5. L2-L4 Motor Strength on the Left: knee extension quadriceps 5/5. L5 Motor Strength on the Right: ankle dorsiflexion tibialis anterior 5/5 and great toe  extension extensor hallucis longus 5/5. L5 Motor Strength on the Left: ankle dorsiflexion tibialis anterior 5/5 and great toe extension extensor hallucis longus 5/5. S1 Motor Strength on the Right: plantar flexion gastrocnemius 5/5. S1 Motor Strength on the Left: plantar flexion gastrocnemius 5/5.  Neurological System: Knee Reflex Right: normal (2). Knee Reflex Left: normal (2). Ankle Reflex Right: normal (2). Ankle Reflex Left: normal (2). Babinski Reflex Right: plantar reflex absent. Babinski Reflex Left: plantar reflex absent. Sensation on the Right: normal distal extremities. Sensation on the Left: normal distal extremities. Special Tests on the Right: no clonus of the ankle/knee and seated straight leg raising test positive. Special Tests on the Left: no clonus of the ankle/knee.  Skin:    General: Skin is warm and dry.  Neurological:     Mental Status: She is  alert.    CT myelogram outside independently reviewed by myself demonstrates indentation and lateral recess stenosis of accentuated with lateral bending on the right particularly at L4-5. There is some on the left as well. Disc protrusion.  MRI demonstrates small disc protrusion slightly displacing the 5 root.   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 Disc protrusion ligamentum flavum hypertrophy.  Plan:  We discussed options of living with her symptoms and the radiculopathy versus consideration of lumbar decompression. 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 Has a fairly small interlaminar window at 4 5. May be prudent to perform a decompression at L4-5 centrally. Evaluate the disc on the right. She clearly has an L5 radiculopathy. Improves with sitting worse with extension and lateral bending.  I had an extensive discussion with the patient concerning the pathology relevant anatomy and treatment options.  At this point exhausting conservative treatment and in the presence of a neurologic deficit we discussed microlumbar decompression. I discussed the risks and benefits including bleeding, infection, DVT, PE, anesthetic complications, worsening in their symptoms, improvement in their symptoms, C SF leakage, epidural fibrosis, need for future surgeries such as revision discectomy and lumbar fusion. I also indicated that this is an operation to basically decompress the nerve root to allow recovery as opposed to fixing a herniated disc and that the incidence of recurrent chest disc herniation can approach 15%. Also that nerve root recovery is variable and may not recover completely.  I discussed the operative course including overnight in the hospital. Immediate ambulation. Follow-up in 2 weeks for suture removal. 6 weeks until healing of the herniation followed by 6 weeks of reconditioning and strengthening of the core musculature. Also discussed the need to employ the concepts of disc pressure management and core motion following the surgery to minimize the risk of recurrent disc herniation. We will obtain preoperative clearance i if necessary and proceed accordingly.  Preoperative clearance.  Any changes in the interim she is to call.  Plan microlumbar decompression L4-5 central  Cecilie Kicks, PA-C  For Dr. Tonita Cong 02/20/2021, 12:16 PM

## 2021-02-20 NOTE — H&P (View-Only) (Signed)
[name] Nolan is an 75 y.o. female.   Chief Complaint: back and leg pain HPI: Reason for Visit: (normal) review of test results (lumbar CT/Myelogram) Context: fall; 14 1/2 months Location (Lower Extremity): lower back pain Severity: pain level 5/10 Quality: aching Aggravating Factors: worse with increased activity Associated Symptoms: weakness (RLE) Medications: Tylenol Still having right leg pain that radiates to the outer aspect of her calf and foot mainly when she is walking and is limiting her ambulatory capacity.  Past Medical History:  Diagnosis Date  . Acute bronchitis 08/15/2015  . Anxiety   . Anxiety state 05/07/2014   Widowed in 2013 after caring for her husband with Lewy Body Dementia for 6 years   . Arrhythmia 01/25/2015   Per Dr. Jaynee Eagles, [facility]; hx PVC  . Arthritis    "knees" (11/07/2015)  . 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 Basal cell carcinoma of right ear 02/26/2015   Removed by Dr Syble Creek  . Chronic back pain    "mid-back; stops at the very lowest part of my back" (11/07/2015)  . Colon polyp 09/03/2017  . Constipation 02/25/2016  . Depression   . Dysphagia   . Dyspnea   . Esophageal reflux    occ  . Fainting    fainted twice  . Family history of adverse reaction to anesthesia    "daughter gets bad PONV"  . Gallbladder problem   . Hashimoto's disease   . Heart murmur   . Hot flashes 05/27/2016  . Hyperlipidemia   . Hypothyroid   . Insomnia   . Joint pain   . Low ferritin 08/27/2015   "took supplements for awhile" (11/07/2015)  . Medicare annual wellness visit, subsequent 08/27/2015  . Menopause   . Migraine    "under control w/daily RX right now" (11/07/2015)  . Occipital neuralgia   . Osteopenia 02/26/2015  . Osteoporosis    osteopenia  . Preventative health care 08/27/2015  . Prolonged depressive reaction   . Swallowing difficulty   . Vitamin B12 deficiency 09/01/2016  . Vitamin D deficiency 03/02/2017    Past  Surgical History:  Procedure Laterality Date  . APPENDECTOMY  11/07/2015  . BASAL CELL CARCINOMA EXCISION Right 02/26/2015   ear  . CHOLECYSTECTOMY OPEN  1974  . COLONOSCOPY  2006  . LAPAROSCOPIC APPENDECTOMY N/A 11/07/2015   Procedure: APPENDECTOMY LAPAROSCOPIC;  Surgeon: Georganna Skeans, MD;  Location: [facility];  Service: General;  Laterality: N/A;  . LAPAROSCOPIC INCISIONAL / UMBILICAL / [place]  11/07/2015   UHR  . Tooth implant     at least 5 years ago per pt  . TUBAL LIGATION  1973  . UMBILICAL HERNIA REPAIR N/A 11/07/2015   Procedure: LAPAROSCOPIC UMBILICAL HERNIA;  Surgeon: Georganna Skeans, MD;  Location: [facility] OR;  Service: General;  Laterality: N/A;    Family History  Problem Relation Age of Onset  . Congestive Heart Failure Mother   . Hypertension Mother   . Hyperlipidemia Mother   . Heart disease Mother   . Obesity Mother   . Leukemia Father   . Obesity Father   . Kidney disease Brother   . Cancer Brother        stage 4 kidney cancer, metastatic  . Kidney cancer Brother   . Leukemia Maternal Aunt   . Congestive Heart Failure Maternal Grandmother   . Arthritis Sister   . Colon cancer Neg Hx    Social History:  reports that she has never smoked. She has never  used smokeless tobacco. She reports that she does not drink alcohol and does not use drugs.  Allergies:  Allergies  Allergen Reactions  . Statins Other (See Comments)    Muscle pain  . Codeine Nausea And Vomiting   Medications: ALPRAZolam 0.5 mg tablet gabapentin 300 mg capsule ibuprofen Synthroid 75 mcg tablet venlafaxine ER 75 mg capsule,extended release 24 hr  Review of Systems  Constitutional: Negative.   HENT: Negative.   Eyes: Negative.   Respiratory: Negative.   Cardiovascular: Negative.   Gastrointestinal: Negative.   Genitourinary: Negative.   Musculoskeletal: Positive for back pain.  Skin: Negative.   Neurological: Positive for weakness and numbness.    There were no vitals  taken for this visit. Physical Exam Constitutional:      Appearance: Normal appearance.  HENT:     Head: Normocephalic.     Right Ear: External ear normal.     Left Ear: External ear normal.     Nose: Nose normal.     Mouth/Throat:     Pharynx: Oropharynx is clear.  Eyes:     Conjunctiva/sclera: Conjunctivae normal.  Cardiovascular:     Rate and Rhythm: Normal rate and regular rhythm.     Pulses: Normal pulses.     Heart sounds: Normal heart sounds.  Pulmonary:     Effort: Pulmonary effort is normal.     Breath sounds: Normal breath sounds.  Abdominal:     General: Bowel sounds are normal.  Musculoskeletal:     Cervical back: Normal range of motion.     Comments: Gait and Station: Appearance: ambulating with no assistive devices and antalgic gait.  Constitutional: General Appearance: healthy-appearing and distress (mild).  Psychiatric: Mood and Affect: active and alert.  Cardiovascular System: Edema Right: none; Dorsalis and posterior tibial pulses 2+. Edema Left: none.  Abdomen: Inspection and Palpation: non-distended and no tenderness.  Skin: Inspection and palpation: no rash.  Lumbar Spine: Inspection: normal alignment. Bony Palpation of the Lumbar Spine: tender at lumbosacral junction.. Bony Palpation of the Right Hip: no tenderness of the greater trochanter and tenderness of the SI joint; Pelvis stable. Bony Palpation of the Left Hip: no tenderness of the greater trochanter and tenderness of the SI joint. Soft Tissue Palpation on the Right: No flank pain with percussion. Active Range of Motion: limited flexion and extention.  Motor Strength: L1 Motor Strength on the Right: hip flexion iliopsoas 5/5. L1 Motor Strength on the Left: hip flexion iliopsoas 5/5. L2-L4 Motor Strength on the Right: knee extension quadriceps 5/5. L2-L4 Motor Strength on the Left: knee extension quadriceps 5/5. L5 Motor Strength on the Right: ankle dorsiflexion tibialis anterior 5/5 and great toe  extension extensor hallucis longus 5/5. L5 Motor Strength on the Left: ankle dorsiflexion tibialis anterior 5/5 and great toe extension extensor hallucis longus 5/5. S1 Motor Strength on the Right: plantar flexion gastrocnemius 5/5. S1 Motor Strength on the Left: plantar flexion gastrocnemius 5/5.  Neurological System: Knee Reflex Right: normal (2). Knee Reflex Left: normal (2). Ankle Reflex Right: normal (2). Ankle Reflex Left: normal (2). Babinski Reflex Right: plantar reflex absent. Babinski Reflex Left: plantar reflex absent. Sensation on the Right: normal distal extremities. Sensation on the Left: normal distal extremities. Special Tests on the Right: no clonus of the ankle/knee and seated straight leg raising test positive. Special Tests on the Left: no clonus of the ankle/knee.  Skin:    General: Skin is warm and dry.  Neurological:     Mental Status: She is  alert.    CT myelogram outside independently reviewed by myself demonstrates indentation and lateral recess stenosis of accentuated with lateral bending on the right particularly at L4-5. There is some on the left as well. Disc protrusion.  MRI demonstrates small disc protrusion slightly displacing the 5 root.   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 Disc protrusion ligamentum flavum hypertrophy.  Plan:  We discussed options of living with her symptoms and the radiculopathy versus consideration of lumbar decompression. 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 Has a fairly small interlaminar window at 4 5. May be prudent to perform a decompression at L4-5 centrally. Evaluate the disc on the right. She clearly has an L5 radiculopathy. Improves with sitting worse with extension and lateral bending.  I had an extensive discussion with the patient concerning the pathology relevant anatomy and treatment options.  At this point exhausting conservative treatment and in the presence of a neurologic deficit we discussed microlumbar decompression. I discussed the risks and benefits including bleeding, infection, DVT, PE, anesthetic complications, worsening in their symptoms, improvement in their symptoms, C SF leakage, epidural fibrosis, need for future surgeries such as revision discectomy and lumbar fusion. I also indicated that this is an operation to basically decompress the nerve root to allow recovery as opposed to fixing a herniated disc and that the incidence of recurrent chest disc herniation can approach 15%. Also that nerve root recovery is variable and may not recover completely.  I discussed the operative course including overnight in the hospital. Immediate ambulation. Follow-up in 2 weeks for suture removal. 6 weeks until healing of the herniation followed by 6 weeks of reconditioning and strengthening of the core musculature. Also discussed the need to employ the concepts of disc pressure management and core motion following the surgery to minimize the risk of recurrent disc herniation. We will obtain preoperative clearance i if necessary and proceed accordingly.  Preoperative clearance.  Any changes in the interim she is to call.  Plan microlumbar decompression L4-5 central  Cecilie Kicks, PA-C  For Dr. Tonita Cong 02/20/2021, 12:16 PM

## 2021-02-28 ENCOUNTER — Other Ambulatory Visit: Payer: Self-pay

## 2021-02-28 ENCOUNTER — Encounter (HOSPITAL_BASED_OUTPATIENT_CLINIC_OR_DEPARTMENT_OTHER): Payer: Self-pay | Admitting: Nurse Practitioner

## 2021-02-28 ENCOUNTER — Ambulatory Visit (HOSPITAL_BASED_OUTPATIENT_CLINIC_OR_DEPARTMENT_OTHER): Payer: Medicare PPO | Admitting: Nurse Practitioner

## 2021-02-28 VITALS — HR 63 | Ht 65.0 in | Wt 191.2 lb

## 2021-02-28 DIAGNOSIS — G43009 Migraine without aura, not intractable, without status migrainosus: Secondary | ICD-10-CM | POA: Diagnosis not present

## 2021-02-28 DIAGNOSIS — E559 Vitamin D deficiency, unspecified: Secondary | ICD-10-CM | POA: Diagnosis not present

## 2021-02-28 DIAGNOSIS — M549 Dorsalgia, unspecified: Secondary | ICD-10-CM | POA: Diagnosis not present

## 2021-02-28 DIAGNOSIS — E669 Obesity, unspecified: Secondary | ICD-10-CM | POA: Diagnosis not present

## 2021-02-28 DIAGNOSIS — E538 Deficiency of other specified B group vitamins: Secondary | ICD-10-CM

## 2021-02-28 DIAGNOSIS — E038 Other specified hypothyroidism: Secondary | ICD-10-CM | POA: Diagnosis not present

## 2021-02-28 DIAGNOSIS — E782 Mixed hyperlipidemia: Secondary | ICD-10-CM | POA: Diagnosis not present

## 2021-02-28 DIAGNOSIS — Z7689 Persons encountering health services in other specified circumstances: Secondary | ICD-10-CM | POA: Insufficient documentation

## 2021-02-28 DIAGNOSIS — F418 Other specified anxiety disorders: Secondary | ICD-10-CM | POA: Diagnosis not present

## 2021-02-28 DIAGNOSIS — E063 Autoimmune thyroiditis: Secondary | ICD-10-CM

## 2021-02-28 HISTORY — DX: Persons encountering health services in other specified circumstances: Z76.89

## 2021-02-28 NOTE — Assessment & Plan Note (Signed)
Receiving injections of 1000 mcg. Will monitor labs at next visit.

## 2021-02-28 NOTE — Assessment & Plan Note (Signed)
New patient to establish care Review of current and past medical history, surgical history, social history, family history, and SDOH completed.  Will review record for updates and recommendations for plan of care.  Appears patient is due for colonoscopy and COVID vaccines Declines vaccination at this time.  Will approach colonoscopy screening at next visit

## 2021-02-28 NOTE — Assessment & Plan Note (Signed)
>>  ASSESSMENT AND PLAN FOR LUMBAR PAIN WRITTEN ON 02/28/2021  6:50 PM BY Jerrilyn Messinger E, NP  Long standing hx of back pain.  Scheduled for surgery on Thursday 03/07/21 with Dr. Shelle Iron Currently on gabapentin 300-900mg  at bedtime for pain control.  No changes to plan of care at this time.  Will continue to monitor.

## 2021-02-28 NOTE — Progress Notes (Signed)
[name], DNP, AGNP-c Primary Care Services ______________________________________________________________________________________________________________________________________________  HPI [name] Nolan is a 75 y.o. female presenting to [facility] today to establish care.   Patient Care Team: Zalmen Wrightsman, Coralee Pesa, NP as PCP - General (Nurse Practitioner) Melvenia Beam, MD as Consulting Physician (Neurology) Delrae Rend, MD as Consulting Physician (Endocrinology) Lavonna Monarch, MD as Consulting Physician (Dermatology) Amalia Greenhouse, MD as Referring Physician (Endocrinology)   Concerns today: [name] Nolan is transferring care to our office at this time. She reports that she is sad to leave her current provider, Dr. Charlett Blake, but she is quite a distance away from her and the travel has become too much for her at this time. She is hoping to have all of her providers within a closer distance to her home.   Situational Anxiety and Depression [name] Nolan is under quite a bit of stress. She tells me her first husband passed away from complications from Lewy Body Dementia in 2013. During his illness she cared for him at home and this was quite a difficult period for her. After her husbands passing, she began a relationship with a friend and eventually married. Two years after the marriage, her second husband began showing signs of Parkinson Disease and was eventually diagnosed with Lewy Body Dementia, as well.  She endorses much of her stress comes from the grief and anxiety of her husbands illness. She is unable to care for him at home alone as he was falling quite often and eventually loss use of his lower extremities. After placement in multiple nursing facilities, he is now in the [place] [facility] in [place], which is more than an hour from home. She tells me she is concerned that he is not aware of his diagnosis and is upset that she cannot  be with him more to help him. Additionally, reliving the experience of a spouse with dementia has been very difficult and she still grieves deeply for the loss of her first husband and misses the time they had together.  She tells me that she has support from friends and neighbors at this time. She gets out of the house and visits with her neighbors daily, but she does not have much desire to do much else. She has one adult daughter who lives locally. She tells me after the death of her first husband, her daughter "disowned" her for a 9 year span after she began seeing her current husband. This sparks a significant amount of depressive and anxiety symptoms. She tells me that she and her daughter do have somewhat of a relationship with her now, but she has little to no support from her and she rarely sees her.   She reports that her 2 dogs are what keep her going and give her a sense of purpose. She enjoys taking care of them and her time with them.   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  She will be having surgery on Thursday of next week to help relieve some of her pain.  Dr. Tonita Cong with [facility] will be performing the surgery.  She expresses concern over the recovery period and how long she will not be able to visit her husband in [place].  She reports that her sister will be going to the surgery with her and taking her home and staying for a few days. At this time, she tells me her daughter has planned to come and  stay with her for a few days after that and her neighbors will be taking care of her dogs.   PHQ9 Today: Depression screen [facility] 2/9 02/28/2021 01/31/2021 02/28/2020  Decreased Interest 1 1 0  Down, Depressed, Hopeless 1 0 1  PHQ - 2 Score 2 1 1   Altered sleeping 3 0 -  Tired, decreased energy 1 0 -  Change in appetite 0 0 -  Feeling bad or failure about yourself  0 0 -  Trouble concentrating 0 0 -  Moving slowly or  fidgety/restless 0 0 -  Suicidal thoughts 0 0 -  PHQ-9 Score 6 1 -  Difficult doing work/chores Somewhat difficult - -   GAD7 Today: GAD 7 : Generalized Anxiety Score 02/28/2021  Nervous, Anxious, on Edge 1  Control/stop worrying 3  Worry too much - different things 1  Trouble relaxing 1  Restless 0  Easily annoyed or irritable 1  Afraid - awful might happen 0  Total GAD 7 Score 7  Anxiety Difficulty Somewhat difficult    Health Maintenance Due  Topic Date Due   COVID-19 Vaccine (1) Never done   COLON CANCER SCREENING ANNUAL FOBT  09/17/2017   COLONOSCOPY (Pts 45-81yrs Insurance coverage will need to be confirmed)  10/28/2018     PMH Past Medical History:  Diagnosis Date   Absolute anemia 11/17/2017   Acute bronchitis 08/15/2015   Acute pain of left knee 07/31/2018   Anxiety    Anxiety state 05/07/2014   Widowed in 2013 after caring for her husband with Lewy Body Dementia for 6 years    Arrhythmia 01/25/2015   Per Dr. Jaynee Eagles, [facility]; hx PVC   Arthritis    "knees" (11/07/2015)   Arthritis of knee, degenerative 03/02/2017   Basal cell carcinoma of right ear 02/26/2015   Removed by Dr Syble Creek   Chronic back pain    "mid-back; stops at the very lowest part of my back" (11/07/2015)   Chronic migraine w/o aura w/o status migrainosus, not intractable 01/17/2016   Colon cancer screening 12/13/2014   Colon polyp 09/03/2017   Constipation 02/25/2016   Constipation 02/25/2016   Depression    Dysphagia    Dysphagia, pharyngoesophageal phase 12/13/2014   Dyspnea    Dyspnea on exertion 12/13/2014   Esophageal reflux    occ   Excessive daytime sleepiness 01/25/2015   Fainting    fainted twice   Family history of adverse reaction to anesthesia    "daughter gets bad PONV"   Gallbladder problem    Hashimoto's disease    Heart murmur    Hot flashes 05/27/2016   Hot flashes 05/27/2016   Hyperlipidemia    Hypothyroid    Insomnia    Joint pain    Low ferritin 08/27/2015    "took supplements for awhile" (11/07/2015)   Low ferritin 08/27/2015   Medicare annual wellness visit, subsequent 08/27/2015   Menopause    Migraine    "under control w/daily RX right now" (11/07/2015)   Morning headache 01/25/2015   Occipital neuralgia    Osteopenia 02/26/2015   Osteoporosis    osteopenia   Other fatigue 11/02/2017   Preventative health care 08/27/2015   Prolonged depressive reaction    Shortness of breath on exertion 11/02/2017   Skin cancer 02/26/2015   Right ear Removed by Dr Syble Creek   Swallowing difficulty    Vitamin B12 deficiency 09/01/2016   Vitamin D deficiency 03/02/2017    ROS All review of systems negative except what  is listed in the HPI  PHYSICAL EXAM General appearance: alert, cooperative, appears stated age, moderate distress, and anxious and tearful throughout the visit., Resp: clear to auscultation bilaterally, Cardio: regular rate and rhythm, S1, S2 normal, no murmur, click, rub or gallop and normal apical impulse, GI: soft, non-tender; bowel sounds normal; no masses,  no organomegaly, and Extremities: extremities normal, atraumatic, no cyanosis or edema   ASSESSMENT AND PLAN Problem List Items Addressed This Visit     Migraine without aura    Followed by Dr. Jaynee Eagles. Receives Botox injections, which she is tolerating well. No changes to plan- will follow       Mixed hyperlipidemia    History of HLD with statin intolerance.  Plan to monitor labs at next visit. Diet and exercise recommendations provided.         Hypothyroidism    Followed by Dr. Posey Pronto with endocrinology On levothyroxine 75 mcg No symptoms or concerns present today No changes to plan of care- will follow       Obesity    BMI 31.82 today Weight concerns in the past with difficulty losing weight due to stressors. Not a candidate for weight loss surgery due to age limitations.  Diet and exercise recommendations provided.        Depression with anxiety    Treatment  with venlafaxine 75mg  and PRN alprazolam 0.25mg  up to BID. Recent refills with previous PCP Her mood is quite sad and anxious today- she has a significant amount of stress with her current life situation.  She reports counseling with a former Theme park manager and friend.  Consider increase in venlafaxine for improved control of mental state- will approach this at next visit.  No SI/HI present- no warning signs. Recommend contacting the office if she has any decreased mood or worsening anxiety for further evaluation.        Vitamin B12 deficiency    Receiving injections of 1000 mcg. Will monitor labs at next visit.         Encounter to establish care - Primary    Education provided today during visit and on AVS for patient to review at home.  Diet and Exercise recommendations provided.  Current diagnoses and recommendations discussed. HM recommendations reviewed with recommendations.    Outpatient Encounter Medications as of 02/28/2021  Medication Sig   ALPRAZolam (XANAX) 0.5 MG tablet Take 1 tablet (0.5 mg total) by mouth at bedtime as needed for anxiety.   botulinum toxin Type A (BOTOX) 100 units SOLR injection INJECT 155 UNITS  INTRAMUSCULARLY EVERY 3  MONTHS (GIVEN AT MD OFFICE, DISCARD UNUSED AFTER 1ST  USE)   gabapentin (NEURONTIN) 300 MG capsule TAKE ONE TO THREE CAPSULES (300MG -900MG ) AT BEDTIME (Patient taking differently: Take 300-900 mg by mouth at bedtime. TAKE ONE TO THREE CAPSULES (300MG -900MG ) AT BEDTIME)   rizatriptan (MAXALT-MLT) 10 MG disintegrating tablet Take 1 tablet (10 mg total) by mouth as needed for migraine. May repeat in 2 hours if needed   SYNTHROID 75 MCG tablet Take 75 mcg by mouth daily before breakfast.   venlafaxine XR (EFFEXOR-XR) 75 MG 24 hr capsule Take 1 capsule (75 mg total) by mouth daily with breakfast.   [DISCONTINUED] ketoconazole (NIZORAL) 2 % cream Apply 1 application topically 2 (two) times daily.   [DISCONTINUED] Vitamin D, Ergocalciferol,  (DRISDOL) 1.25 MG (50000 UNIT) CAPS capsule Take 1 capsule (50,000 Units total) by mouth every 7 (seven) days. (Patient not taking: No sig reported)   Facility-Administered Encounter Medications as of 02/28/2021  Medication   cyanocobalamin ((VITAMIN B-12)) injection 1,000 mcg    Return in about 6 months (around 08/30/2021) for General Health F/U.  Time: 75 minutes, >50% spent counseling, care coordination, chart review, and documentation.   Orma Render, DNP, AGNP-c

## 2021-02-28 NOTE — Assessment & Plan Note (Signed)
Followed by Dr. Posey Pronto with endocrinology On levothyroxine 75 mcg No symptoms or concerns present today No changes to plan of care- will follow

## 2021-02-28 NOTE — Assessment & Plan Note (Signed)
Long standing hx of back pain.  Scheduled for surgery on Thursday 03/07/21 with Dr. Tonita Cong Currently on gabapentin 300-900mg  at bedtime for pain control.  No changes to plan of care at this time.  Will continue to monitor.

## 2021-02-28 NOTE — Assessment & Plan Note (Signed)
On OTC supplementation at this time.  No changes currently.  Will monitor labs at next visit.

## 2021-02-28 NOTE — Patient Instructions (Addendum)
Information on diet, exercise, and health maintenance recommendations are listed below. This is information to help you be sure you are on track for optimal health and monitoring.   Please look over this and let us know if you have any questions or if you have completed any of the health maintenance outside of [facility] so that we can be sure your records are up to date.   Our records indicate you are due for the following: COVID Vaccine Colon Cancer Screening  We will update this list based on information you give [place] and records we receive after today. If you have not had these done, please let us know and we will be happy to help you arrange these recommended health maintenance activities.    ___________________________________________________________  Thank you for choosing [facility] for your Primary Care needs. I am excited for the opportunity to partner with you to meet your health care goals. It was a pleasure meeting you today!  I am an Adult-Geriatric Nurse Practitioner with a background in caring for patients for more than 20 years. I received my Paediatric nurse in Nursing and my Doctor of Nursing Practice degrees at [employer]. I received additional fellowship training in primary care and sports medicine after receiving my doctorate degree. I provide primary care and sports medicine services to patients age 63 and older within this office. I am also a provider with the [facility] and the director of the APP Fellowship with [facility].  I am a [place] native, but have called the [place] area home for nearly 20 years and am proud to be a member of this community.   I am passionate about providing the best service to you through preventive medicine and supportive care. I consider you a part of the medical team and value your input. I work diligently to ensure that you are heard and your needs are  met in a safe and effective manner. I want you to feel comfortable with me as your provider and want you to know that your health concerns are important to me.   For your information, our office hours are Monday- Friday 8:00 AM - 5:00 PM At this time I am not in the office on Wednesdays.  If you have questions or concerns, please call our office at [phone] or send [place] a MyChart message and we will respond as quickly as possible.   For all urgent or time sensitive needs we ask that you please call the office to avoid delays. MyChart is not constantly monitored and replies may take up to 72 business hours.  MyChart Policy: MyChart allows for you to see your visit notes, after visit summary, provider recommendations, lab and tests results, make an appointment, request refills, and contact your provider or the office for non-urgent questions or concerns.  Providers are seeing patients during normal business hours and do not have built in time to review MyChart messages. We ask that you allow a minimum of 72 business hours for MyChart message responses.  Complex MyChart concerns may require a visit. Your provider may request you schedule a virtual or in person visit to ensure we are providing the best care possible. MyChart messages sent after 4:00 PM on Friday will not be received by the provider until Monday morning.    Lab and Test Results: You will receive your lab and test results on MyChart as soon as they are completed and  results have been sent by the lab or testing facility. Due to this service, you will receive your results BEFORE your provider.  Please allow a minimum of 72 business hours for your provider to receive and review lab and test results and contact you about.   Most lab and test result comments from the provider will be sent through [place]. Your provider may recommend changes to the plan of care, follow-up visits, repeat testing, ask questions, or request an office visit to  discuss these results. You may reply directly to this message or call the office at [phone] to provide information for the provider or set up an appointment. In some instances, you will be called with test results and recommendations. Please let us know if this is preferred and we will make note of this in your chart to provide this for you.    If you have not heard a response to your lab or test results in 72 business hours, please call the office to let us know.   After Hours: For all non-emergency after hours needs, please call the office at [phone] and select the option to reach the on-call provider service. On-call services are shared between multiple [facility] offices and therefore it will not be possible to speak directly with your provider. On-call providers may provide medical advice and recommendations, but are unable to provide refills for maintenance medications.  For all emergency or urgent medical needs after normal business hours, we recommend that you seek care at the closest Urgent Care or Emergency Department to ensure appropriate treatment in a timely manner.  [facility] has a 24 hour emergency room located on the ground floor for your convenience.    Please do not hesitate to reach out to [place] with concerns.   Thank you, again, for choosing me as your health care partner. I appreciate your trust and look forward to learning more about you.   [name] Keeler, DNP, AGNP-c ___________________________________________________________  Health Maintenance Recommendations Screening Testing Mammogram Every 1 -2 years based on history and risk factors Starting at age 56 Pap Smear Ages 21-39 every 3 years Ages 32-65 every 5 years with HPV testing More frequent testing may be required based on results and history Colon Cancer Screening Every 1-10 years based on test performed, risk factors, and history Starting at age 74 Bone Density  Screening Every 2-10 years based on history Starting at age 22 for women Recommendations for men differ based on medication usage, history, and risk factors AAA Screening One time ultrasound Men 65-58 years old who have every smoked Lung Cancer Screening Low Dose Lung CT every 12 months Age 48-80 years with a 30 pack-year smoking history who still smoke or who have quit within the last 15 years  Screening Labs Routine  Labs: Complete Blood Count (CBC), Complete Metabolic Panel (CMP), Cholesterol (Lipid Panel) Every 6-12 months based on history and medications May be recommended more frequently based on current conditions or previous results Hemoglobin A1c Lab Every 3-12 months based on history and previous results Starting at age 17 or earlier with diagnosis of diabetes, high cholesterol, BMI >26, and/or risk factors Frequent monitoring for patients with diabetes to ensure blood sugar control Thyroid Panel (TSH w/ T3 & T4) Every 6 months based on history, symptoms, and risk factors May be repeated more often if on medication HIV One time testing for all patients 47 and older May be repeated more frequently for patients with increased risk factors or  exposure Hepatitis C One time testing for all patients 18 and older May be repeated more frequently for patients with increased risk factors or exposure Gonorrhea, Chlamydia Every 12 months for all sexually active persons 13-24 years Additional monitoring may be recommended for those who are considered high risk or who have symptoms PSA Men 89-71 years old with risk factors Additional screening may be recommended from age 30-69 based on risk factors, symptoms, and history  Vaccine Recommendations Tetanus Booster All adults every 10 years Flu Vaccine All patients 6 months and older every year COVID Vaccine All patients 12 years and older Initial dosing with booster May recommend additional booster based on age and health  history HPV Vaccine 2 doses all patients age 39-26 Dosing may be considered for patients over 26 Shingles Vaccine (Shingrix) 2 doses all adults 64 years and older Pneumonia (Pneumovax 23) All adults 51 years and older May recommend earlier dosing based on health history Pneumonia (Prevnar 44) All adults 6 years and older Dosed 1 year after Pneumovax 23  Additional Screening, Testing, and Vaccinations may be recommended on an individualized basis based on family history, health history, risk factors, and/or exposure.  __________________________________________________________  Diet Recommendations for All Patients  I recommend that all patients maintain a diet low in saturated fats, carbohydrates, and cholesterol. While this can be challenging at first, it is not impossible and small changes can make big differences.  Things to try: Decreasing the amount of soda, sweet tea, and/or juice to one or less per day and replace with water While water is always the first choice, if you do not like water you may consider adding a water additive without sugar to improve the taste other sugar free drinks Replace potatoes with a brightly colored vegetable at dinner Use healthy oils, such as canola oil or olive oil, instead of butter or hard margarine Limit your bread intake to two pieces or less a day Replace regular pasta with low carb pasta options Bake, broil, or grill foods instead of frying Monitor portion sizes  Eat smaller, more frequent meals throughout the day instead of large meals  An important thing to remember is, if you love foods that are not great for your health, you don't have to give them up completely. Instead, allow these foods to be a reward when you have done well. Allowing yourself to still have special treats every once in a while is a nice way to tell yourself thank you for working hard to keep yourself healthy.   Also remember that every day is a new day. If you have a  bad day and "fall off the wagon", you can still climb right back up and keep moving along on your journey!  We have resources available to help you!  Some websites that may be helpful include: www.http://carter.biz/  Www.VeryWellFit.com _____________________________________________________________  Activity Recommendations for All Patients  I recommend that all adults get at least 20 minutes of moderate physical activity that elevates your heart rate at least 5 days out of the week.  Some examples include: Walking or jogging at a pace that allows you to carry on a conversation Cycling (stationary bike or outdoors) Water aerobics Yoga Weight lifting Dancing If physical limitations prevent you from putting stress on your joints, exercise in a pool or seated in a chair are excellent options.  Do determine your MAXIMUM heart rate for activity: YOUR AGE - 220 = MAX HeartRate   Remember! Do not push yourself too hard.  Start slowly and build up your pace, speed, weight, time in exercise, etc.  Allow your body to rest between exercise and get good sleep. You will need more water than normal when you are exerting yourself. Do not wait until you are thirsty to drink. Drink with a purpose of getting in at least 8, 8 ounce glasses of water a day plus more depending on how much you exercise and sweat.    If you begin to develop dizziness, chest pain, abdominal pain, jaw pain, shortness of breath, headache, vision changes, lightheadedness, or other concerning symptoms, stop the activity and allow your body to rest. If your symptoms are severe, seek emergency evaluation immediately. If your symptoms are concerning, but not severe, please let us know so that we can recommend further evaluation.   ________________________________________________________________

## 2021-02-28 NOTE — Assessment & Plan Note (Signed)
Followed by Dr. Jaynee Eagles. Receives Botox injections, which she is tolerating well. No changes to plan- will follow

## 2021-02-28 NOTE — Assessment & Plan Note (Signed)
>>  ASSESSMENT AND PLAN FOR OBESITY WRITTEN ON 02/28/2021  6:44 PM BY Cruzito Standre E, NP  BMI 31.82 today Weight concerns in the past with difficulty losing weight due to stressors. Not a candidate for weight loss surgery due to age limitations.  Diet and exercise recommendations provided.

## 2021-02-28 NOTE — Assessment & Plan Note (Signed)
BMI 31.82 today Weight concerns in the past with difficulty losing weight due to stressors. Not a candidate for weight loss surgery due to age limitations.  Diet and exercise recommendations provided.

## 2021-02-28 NOTE — Assessment & Plan Note (Signed)
>>  ASSESSMENT AND PLAN FOR HYPOTHYROIDISM WRITTEN ON 02/28/2021  6:41 PM BY Mcadoo Muzquiz E, NP  Followed by Dr. Allena Katz with endocrinology On levothyroxine 75 mcg No symptoms or concerns present today No changes to plan of care- will follow

## 2021-02-28 NOTE — Assessment & Plan Note (Signed)
Treatment with venlafaxine 75mg  and PRN alprazolam 0.25mg  up to BID. Recent refills with previous PCP Her mood is quite sad and anxious today- she has a significant amount of stress with her current life situation.  She reports counseling with a former Theme park manager and friend.  Consider increase in venlafaxine for improved control of mental state- will approach this at next visit.  No SI/HI present- no warning signs. Recommend contacting the office if she has any decreased mood or worsening anxiety for further evaluation.

## 2021-02-28 NOTE — Assessment & Plan Note (Signed)
History of HLD with statin intolerance.  Plan to monitor labs at next visit. Diet and exercise recommendations provided.

## 2021-02-28 NOTE — Assessment & Plan Note (Signed)
>>  ASSESSMENT AND PLAN FOR MIGRAINE WITHOUT AURA WRITTEN ON 02/28/2021  6:39 PM BY Ariellah Faust E, NP  Followed by Dr. Lucia Gaskins. Receives Botox injections, which she is tolerating well. No changes to plan- will follow

## 2021-03-05 ENCOUNTER — Encounter (HOSPITAL_COMMUNITY)
Admission: RE | Admit: 2021-03-05 | Discharge: 2021-03-05 | Disposition: A | Discharge status: Home / Self Care | Payer: Medicare PPO | Source: Ambulatory Visit | Attending: Specialist | Admitting: Specialist

## 2021-03-05 ENCOUNTER — Other Ambulatory Visit: Payer: Self-pay

## 2021-03-05 ENCOUNTER — Ambulatory Visit (HOSPITAL_COMMUNITY)
Admission: RE | Admit: 2021-03-05 | Discharge: 2021-03-05 | Disposition: A | Discharge status: Home / Self Care | Payer: Medicare PPO | Source: Ambulatory Visit | Attending: Orthopedic Surgery | Admitting: Orthopedic Surgery

## 2021-03-05 ENCOUNTER — Ambulatory Visit (INDEPENDENT_AMBULATORY_CARE_PROVIDER_SITE_OTHER): Payer: Medicare PPO | Admitting: Neurology

## 2021-03-05 ENCOUNTER — Encounter (HOSPITAL_COMMUNITY): Payer: Self-pay

## 2021-03-05 DIAGNOSIS — Z01818 Encounter for other preprocedural examination: Secondary | ICD-10-CM | POA: Diagnosis not present

## 2021-03-05 DIAGNOSIS — M47816 Spondylosis without myelopathy or radiculopathy, lumbar region: Secondary | ICD-10-CM | POA: Insufficient documentation

## 2021-03-05 DIAGNOSIS — M4606 Spinal enthesopathy, lumbar region: Secondary | ICD-10-CM | POA: Diagnosis not present

## 2021-03-05 DIAGNOSIS — U071 COVID-19: Secondary | ICD-10-CM | POA: Diagnosis not present

## 2021-03-05 DIAGNOSIS — M5126 Other intervertebral disc displacement, lumbar region: Secondary | ICD-10-CM | POA: Insufficient documentation

## 2021-03-05 DIAGNOSIS — G43719 Chronic migraine without aura, intractable, without status migrainosus: Secondary | ICD-10-CM | POA: Diagnosis not present

## 2021-03-05 DIAGNOSIS — M48061 Spinal stenosis, lumbar region without neurogenic claudication: Secondary | ICD-10-CM | POA: Insufficient documentation

## 2021-03-05 HISTORY — DX: Pneumonia, unspecified organism: J18.9

## 2021-03-05 LAB — CBC
HCT: 43.9 % (ref 36.0–46.0)
Hemoglobin: 14.4 g/dL (ref 12.0–15.0)
MCH: 29.9 pg (ref 26.0–34.0)
MCHC: 32.8 g/dL (ref 30.0–36.0)
MCV: 91.3 fL (ref 80.0–100.0)
Platelets: 242 10*3/uL (ref 150–400)
RBC: 4.81 MIL/uL (ref 3.87–5.11)
RDW: 13.5 % (ref 11.5–15.5)
WBC: 5.7 10*3/uL (ref 4.0–10.5)
nRBC: 0 % (ref 0.0–0.2)

## 2021-03-05 LAB — BASIC METABOLIC PANEL
Anion gap: 8 (ref 5–15)
BUN: 11 mg/dL (ref 8–23)
CO2: 23 mmol/L (ref 22–32)
Calcium: 9.8 mg/dL (ref 8.9–10.3)
Chloride: 107 mmol/L (ref 98–111)
Creatinine, Ser: 0.95 mg/dL (ref 0.44–1.00)
GFR, Estimated: >60 mL/min (ref >60)
Glucose, Bld: 91 mg/dL (ref 70–99)
Potassium: 3.9 mmol/L (ref 3.5–5.1)
Sodium: 138 mmol/L (ref 135–145)

## 2021-03-05 LAB — SURGICAL PCR SCREEN
MRSA, PCR: NEGATIVE
Staphylococcus aureus: NEGATIVE

## 2021-03-05 LAB — SARS CORONAVIRUS 2 (TAT 6-24 HRS): SARS Coronavirus 2: POSITIVE — AB

## 2021-03-05 NOTE — Progress Notes (Signed)
PCP - Jacolyn Reedy, NP; Gwyneth Revels, MD Cardiologist - Denies Neurologist- Sarina Ill, MD  PPM/ICD - Denies  Lumbar x-ray - 03/05/21 EKG - 03/05/21 Stress Test - 06/21/14 ECHO - 06/12/14 Cardiac Cath - Denies  Sleep Study - Yes, negative for OSA   Pt is not diabetic.  Blood Thinner Instructions: N/A Aspirin Instructions: N/A  ERAS Protcol - Yes PRE-SURGERY Ensure or G2- Ensure given  COVID TEST- 03/05/21   Anesthesia review: Yes, review ECHO & stress test  Patient denies shortness of breath, fever, cough and chest pain at PAT appointment   All instructions explained to the patient, with a verbal understanding of the material. Patient agrees to go over the instructions while at home for a better understanding. Patient also instructed to self quarantine after being tested for COVID-19. The opportunity to ask questions was provided.

## 2021-03-05 NOTE — Pre-Procedure Instructions (Signed)
Surgical Instructions:    Your procedure is scheduled on Thursday, June 23rd (07:30 AM- 09:43 AM).  Report to [facility] Main Entrance "A" at 05:30 A.M., then check in with the Admitting office.  Call this number if you have any questions prior to your surgery date, or have problems the morning of surgery:  [phone]    Remember:  Do not eat after midnight the night before your surgery.  You may drink clear liquids until 04:30 AM the morning of your surgery.   Clear liquids allowed are: Water, Non-Citrus Juices (without pulp), Carbonated Beverages, Clear Tea, Black Coffee Only, and Gatorade.   Enhanced Recovery after Surgery for Orthopedics Enhanced Recovery after Surgery is a protocol used to improve the stress on your body and your recovery after surgery.  Patient Instructions  The day of surgery (if you do NOT have diabetes):  Drink ONE (1) Pre-Surgery Clear Ensure by 04:30 AM the morning of surgery.   This drink was given to you during your hospital pre-op appointment visit. Nothing else to drink after completing the Pre-Surgery Clear Ensure.         If you have questions, please contact your surgeon's office.       Take these medicines the morning of surgery with A SIP OF WATER:   SYNTHROID  venlafaxine XR (EFFEXOR-XR)  IF NEEDED: rizatriptan (MAXALT-MLT)  As of today, STOP taking any Aspirin (unless otherwise instructed by your surgeon) Aleve, Naproxen, Ibuprofen, Motrin, Advil, Goody's, BC's, all herbal medications, fish oil, and all vitamins.             Special instructions:    [place]- Preparing For Surgery  Before surgery, you can play an important role. Because skin is not sterile, your skin needs to be as free of germs as possible. You can reduce the number of germs on your skin by washing with CHG (chlorahexidine gluconate) Soap before surgery.  CHG is an antiseptic cleaner which kills germs and bonds with the skin to continue killing germs even  after washing.     Please do not use if you have an allergy to CHG or antibacterial soaps. If your skin becomes reddened/irritated stop using the CHG.  Do not shave (including legs and underarms) for at least 48 hours prior to first CHG shower. It is OK to shave your face.  Please follow these instructions carefully.     Shower the NIGHT BEFORE SURGERY and the MORNING OF SURGERY with CHG Soap.   If you chose to wash your hair, wash your hair first as usual with your normal shampoo. After you shampoo, rinse your hair and body thoroughly to remove the shampoo.  Then [employer] and genitals (private parts) with your normal soap and rinse thoroughly to remove soap.  After that Use CHG Soap as you would any other liquid soap. You can apply CHG directly to the skin and wash gently with a scrungie or a clean washcloth.   Apply the CHG Soap to your body ONLY FROM THE NECK DOWN.  Do not use on open wounds or open sores. Avoid contact with your eyes, ears, mouth and genitals (private parts). Wash Face and genitals (private parts)  with your normal soap.   Wash thoroughly, paying special attention to the area where your surgery will be performed.  Thoroughly rinse your body with warm water from the neck down.  DO NOT shower/wash with your normal soap after using and rinsing off the CHG Soap.  Pat yourself  dry with a CLEAN TOWEL.  Wear CLEAN PAJAMAS to bed the night before surgery  Place CLEAN SHEETS on your bed the night before your surgery  DO NOT SLEEP WITH PETS.   Day of Surgery:  Take a shower with CHG soap. Wear Clean/Comfortable clothing the morning of surgery Do not apply any deodorants/lotions.   Remember to brush your teeth WITH YOUR REGULAR TOOTHPASTE. Do not wear jewelry or makeup. DO Not wear nail polish, gel polish, artificial nails, or any other type of covering on natural nails including finger and toenails. If patients have artificial nails, gel coating, etc. that need to  be removed by a nail salon please have this removed prior to surgery or surgery may need to be canceled/delayed if the surgeon/ anesthesia feels like the patient is unable to be adequately monitored. Do not wear lotions, powders, perfumes, or deodorant. Do not shave 48 hours prior to surgery.   Do not bring valuables to the hospital. [facility] is not responsible for any belongings or valuables.  Do NOT Smoke (Tobacco/Vaping) or drink Alcohol 24 hours prior to your procedure.  If you use a CPAP at night, you may bring all equipment for your overnight stay.   Contacts, glasses, dentures or bridgework may not be worn into surgery, please bring cases for these belongings.   For patients admitted to the hospital, discharge time will be determined by your treatment team.   Patients discharged the day of surgery will not be allowed to drive home, and someone needs to stay with them for 24 hours.  ONLY 1 SUPPORT PERSON MAY BE PRESENT WHILE YOU ARE IN SURGERY. IF YOU ARE TO BE ADMITTED ONCE YOU ARE IN YOUR ROOM YOU WILL BE ALLOWED TWO (2) VISITORS.  Minor children may have two parents present. Special consideration for safety and communication needs will be reviewed on a case by case basis.     Please read over the following fact sheets that you were given.

## 2021-03-05 NOTE — Progress Notes (Signed)
Botox- 200 units x 1 vial Lot: [id number] Expiration: 08/2023 NDC: 8270-7867-54  Bacteriostatic 0.9% Sodium Chloride- 70mL total Lot: [id number] Expiration: 04/05/21 NDC: 0712-1975-88  Dx: T25.498  B/B

## 2021-03-05 NOTE — Progress Notes (Signed)
Consent Form Botulism Toxin Injection For Chronic Migraine  03/05/2021 stable: Doing extremely well at least 70% improvement continues in migraine frequency. She is not a clencher, do not place masseters. [name] is in a geriatric psych facility in [place].   Reviewed orally with patient, additionally signature is on file:  Botulism toxin has been approved by the Federal drug administration for treatment of chronic migraine. Botulism toxin does not cure chronic migraine and it may not be effective in some patients.  The administration of botulism toxin is accomplished by injecting a small amount of toxin into the muscles of the neck and head. Dosage must be titrated for each individual. Any benefits resulting from botulism toxin tend to wear off after 3 months with a repeat injection required if benefit is to be maintained. Injections are usually done every 3-4 months with maximum effect peak achieved by about 2 or 3 weeks. Botulism toxin is expensive and you should be sure of what costs you will incur resulting from the injection.  The side effects of botulism toxin use for chronic migraine may include:   -Transient, and usually mild, facial weakness with facial injections  -Transient, and usually mild, head or neck weakness with head/neck injections  -Reduction or loss of forehead facial animation due to forehead muscle weakness  -Eyelid drooping  -Dry eye  -Pain at the site of injection or bruising at the site of injection  -Double vision  -Potential unknown long term risks  Contraindications: You should not have Botox if you are pregnant, nursing, allergic to albumin, have an infection, skin condition, or muscle weakness at the site of the injection, or have myasthenia gravis, Lambert-Eaton syndrome, or ALS.  It is also possible that as with any injection, there may be an allergic reaction or no effect from the medication. Reduced effectiveness after repeated injections is sometimes seen and  rarely infection at the injection site may occur. All care will be taken to prevent these side effects. If therapy is given over a long time, atrophy and wasting in the muscle injected may occur. Occasionally the patient's become refractory to treatment because they develop antibodies to the toxin. In this event, therapy needs to be modified.  I have read the above information and consent to the administration of botulism toxin.    BOTOX PROCEDURE NOTE FOR MIGRAINE HEADACHE    Contraindications and precautions discussed with patient(above). Aseptic procedure was observed and patient tolerated procedure. Procedure performed by Dr. Georgia Dom  The condition has existed for more than 6 months, and pt does not have a diagnosis of ALS, Myasthenia Gravis or Lambert-Eaton Syndrome.  Risks and benefits of injections discussed and pt agrees to proceed with the procedure.  Written consent obtained  These injections are medically necessary. Pt  receives good benefits from these injections. These injections do not cause sedations or hallucinations which the oral therapies may cause.  Description of procedure:  The patient was placed in a sitting position. The standard protocol was used for Botox as follows, with 5 units of Botox injected at each site:   -Procerus muscle, midline injection  -Corrugator muscle, bilateral injection  -Frontalis muscle, bilateral injection, with 2 sites each side, medial injection was performed in the upper one third of the frontalis muscle, in the region vertical from the medial inferior edge of the superior orbital rim. The lateral injection was again in the upper one third of the forehead vertically above the lateral limbus of the cornea, 1.5 cm lateral to  the medial injection site.  -Temporalis muscle injection, 4 sites, bilaterally. The first injection was 3 cm above the tragus of the ear, second injection site was 1.5 cm to 3 cm up from the first injection site in  line with the tragus of the ear. The third injection site was 1.5-3 cm forward between the first 2 injection sites. The fourth injection site was 1.5 cm posterior to the second injection site.   -Occipitalis muscle injection, 3 sites, bilaterally. The first injection was done one half way between the occipital protuberance and the tip of the mastoid process behind the ear. The second injection site was done lateral and superior to the first, 1 fingerbreadth from the first injection. The third injection site was 1 fingerbreadth superiorly and medially from the first injection site.  -Cervical paraspinal muscle injection, 2 sites, bilateral knee first injection site was 1 cm from the midline of the cervical spine, 3 cm inferior to the lower border of the occipital protuberance. The second injection site was 1.5 cm superiorly and laterally to the first injection site.  -Trapezius muscle injection was performed at 3 sites, bilaterally. The first injection site was in the upper trapezius muscle halfway between the inflection point of the neck, and the acromion. The second injection site was one half way between the acromion and the first injection site. The third injection was done between the first injection site and the inflection point of the neck.   Will return for repeat injection in 3 months.   200 units of Botox was used, any Botox not injected was wasted. The patient tolerated the procedure well, there were no complications of the above procedure.

## 2021-03-06 NOTE — Progress Notes (Signed)
TC to Dr Tonita Cong re Positive covid result from 03/05/2021. Per protocol case will need to be rescheduled unless deemed urgent/emergent for 10 days.

## 2021-03-17 ENCOUNTER — Encounter (HOSPITAL_COMMUNITY): Payer: Self-pay | Admitting: Specialist

## 2021-03-17 ENCOUNTER — Other Ambulatory Visit: Payer: Self-pay

## 2021-03-17 NOTE — Progress Notes (Signed)
Pt returned the previous call from [employer]. Instructions updated, and all questions answered.

## 2021-03-17 NOTE — Progress Notes (Signed)
left messgage for patient regarding new surgery date.  Patient to arrive at 0530 am the morning of surgery, asked if she still had instructions from her visit in june.  If so those instructions are ok to use.  asked if there were any changes in her medications. If so to please contact us back to discuss those changes.  Will attempt another time  SDW CALL  Patient was given pre-op instructions over the phone. The opportunity was given for the patient to ask questions. No further questions asked. Patient verbalized understanding of instructions given.  PCP - Jacolyn Reedy, NP; Gwyneth Revels, MD Cardiologist - Denies Neurologist- Sarina Ill, MD  PPM/ICD - Denies  Lumbar x-ray - 03/05/21 EKG - 03/05/21 Stress Test - 06/21/14 ECHO - 06/12/14 Cardiac Cath - Denies  Sleep Study - Yes, negative for OSA   Pt is not diabetic.  Blood Thinner Instructions: N/A Aspirin Instructions: N/A  ERAS Protcol - Yes PRE-SURGERY Ensure or G2- Ensure given  COVID TEST- 03/05/21 Positive   Anesthesia review: Yes, review ECHO & stress test  Patient denies shortness of breath, fever, cough and chest pain at PAT appointment   All instructions explained to the patient, with a verbal understanding of the material. Patient agrees to go over the instructions while at home for a better understanding. Patient also instructed to self quarantine after being tested for COVID-19. The opportunity to ask questions was provided.

## 2021-03-19 NOTE — Progress Notes (Signed)
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 Her PAT visit was on 03/05/21. Surgery was initially planned for 03/07/21, but was postponed until 03/21/21 due to + preoperative screening COVID-19 test on 03/05/21.    History includes never smoker, post-operative N/V (1974), hypothyroidism (due to Hashimoto's thyroiditis), murmur (no significant valvular disease on 06/12/14 echo), arrhythmia (PVCs 05/2014), occipital neuralgia, HLD, syncope (~ 2016 in setting of severe migraine), anemia, skin cancer, reflux, dysphagia, exertional dyspnea (with negative cardiac work-up 2015), chronic back pain, appendectomy & UHR (11/07/15). Sleep study on 03/11/15 showed non-specific findings of sleep fragmentation, abnormal sleep stage percentages, and mild snoring, but no significant OSA or central apnea.    Anesthesia team to evaluate on the day of surgery.    VS: BP 130/88   Pulse 73   Temp 36.9 C (Oral)   Resp 18   Ht 5\' 5"  (1.651 m)   Wt 85.7 kg   SpO2 98%   BMI 31.43 kg/m    PROVIDERS: Orma Render, NP is PCP, established 02/28/21 to be closer to her PCP office. Previously saw Penni Homans, MD.  Sarina Ill, MD is neurologist. She receives Botox injections for migraines.  Amalia Greenhouse, MD is endocrinologist [facility]). Last visit 01/24/21.  - She is not followed routinely by a cardiologist, but was evaluated by Kirk Ruths, MD in 05/2014 following an ED visit for chest pain and dyspnea and had a non-ischemic stress test.    LABS: Labs reviewed: Acceptable for surgery. Labs from 03/05/21 show a normal CBC and BMET.  (all labs ordered are listed, but only abnormal results are displayed)  Labs Reviewed  SARS  CORONAVIRUS 2 (TAT 6-24 HRS) - Abnormal; Notable for the following components:      Result Value   SARS Coronavirus 2 POSITIVE (*)    All other components within normal limits  SURGICAL PCR SCREEN  CBC  BASIC METABOLIC PANEL  TSH low normal at 0.46 and Free T4 high normal at 1.1 on 01/15/21. Continue current levothyroxine dose at 01/24/21 endocrinology visit.    IMAGES: Xray L-spine 03/05/21: IMPRESSION: Mild disc space narrowing and endplate spurring at D9-I3. Multilevel facet hypertrophy.  CT L-spine 01/09/21: IMPRESSION: 1. Mild multilevel lumbar spondylosis as described above. Mild spinal canal and lateral recess stenosis at L3-L4 and L4-L5, which worsens with extension. Right lateral recess stenosis at L4-L5 also worsens when bending to the right. 2. Aortic Atherosclerosis (ICD10-I70.0).   EKG: 03/05/21: Normal sinus rhythm Nonspecific ST and T wave abnormality Left axis deviation Abnormal ECG Since last tracing Rate faster Confirmed by Skeet Latch [phone]) on 03/06/2021 12:03:27 AM   CV: Nuclear stress test 06/20/14:  Overall Impression:   Normal stress nuclear study. There is no scar or ischemia. This is a low risk scan. LV Ejection Fraction: 56%.  LV Wall Motion:  Normal Wall Motion.   Echo 06/12/14: Study Conclusions - Left ventricle: Fequent PVCs. Overall LVEF is probably low normal at 50% The cavity size was normal. Wall thickness was normal. No AR/AS. No MR. Trivial TR.   Past Medical History:  Diagnosis Date   Absolute anemia 11/17/2017   Acute bronchitis 08/15/2015  Acute pain of left knee 07/31/2018   Anxiety    Anxiety state 05/07/2014   Widowed in 2013 after caring for her husband with Lewy Body Dementia for 6 years    Arrhythmia 01/25/2015   Per Dr. Jaynee Eagles, [facility]; hx PVC   Arthritis    "knees" (11/07/2015)   Arthritis of knee, degenerative 03/02/2017   Basal cell carcinoma of right ear 02/26/2015   Removed by Dr Syble [place]    Chronic back pain    "mid-back; stops at the very lowest part of my back" (11/07/2015)   Chronic migraine w/o aura w/o status migrainosus, not intractable 01/17/2016   Colon cancer screening 12/13/2014   Colon polyp 09/03/2017   Constipation 02/25/2016   Constipation 02/25/2016   Depression    Dysphagia    Dysphagia, pharyngoesophageal phase 12/13/2014   Dyspnea on exertion 12/13/2014   Esophageal reflux    occ   Excessive daytime sleepiness 01/25/2015   Fainting    fainted twice   Family history of adverse reaction to anesthesia    "daughter gets bad PONV"   Gallbladder problem    Hashimoto's disease    Heart murmur    Hot flashes 05/27/2016   Hot flashes 05/27/2016   Hyperlipidemia    Hypothyroid    Insomnia    Joint pain    Low ferritin 08/27/2015   "took supplements for awhile" (11/07/2015)   Low ferritin 08/27/2015   Medicare annual wellness visit, subsequent 08/27/2015   Menopause    Migraine    "under control w/daily RX right now" (11/07/2015)   Morning headache 01/25/2015   Occipital neuralgia    Osteopenia 02/26/2015   Osteoporosis    osteopenia   Other fatigue 11/02/2017   Pneumonia    PONV (postoperative nausea and vomiting) 1974   after cholecystectomy   Preventative health care 08/27/2015   Prolonged depressive reaction    Shortness of breath on exertion 11/02/2017   Skin cancer 02/26/2015   Right ear Removed by Dr Syble Creek   Swallowing difficulty    Vitamin B12 deficiency 09/01/2016   Vitamin D deficiency 03/02/2017    Past Surgical History:  Procedure Laterality Date   APPENDECTOMY  11/07/2015   BASAL CELL CARCINOMA EXCISION Right 02/26/2015   ear   CATARACT EXTRACTION Bilateral    CHOLECYSTECTOMY OPEN  09/15/1972   COLONOSCOPY  09/15/2004   LAPAROSCOPIC APPENDECTOMY N/A 11/07/2015   Procedure: APPENDECTOMY LAPAROSCOPIC;  Surgeon: Georganna Skeans, MD;  Location: [place];  Service: General;  Laterality: N/A;   LAPAROSCOPIC INCISIONAL / UMBILICAL /  [place]  11/07/2015   UHR   Tooth implant     at least 5 years ago per pt   TUBAL LIGATION  82/70/7867   UMBILICAL HERNIA REPAIR N/A 11/07/2015   Procedure: LAPAROSCOPIC UMBILICAL HERNIA;  Surgeon: Georganna Skeans, MD;  Location: [facility] OR;  Service: General;  Laterality: N/A;    MEDICATIONS:  ALPRAZolam (XANAX) 0.5 MG tablet   botulinum toxin Type A (BOTOX) 100 units SOLR injection   gabapentin (NEURONTIN) 300 MG capsule   ketoconazole (NIZORAL) 2 % cream   rizatriptan (MAXALT-MLT) 10 MG disintegrating tablet   SYNTHROID 75 MCG tablet   venlafaxine XR (EFFEXOR-XR) 75 MG 24 hr capsule    cyanocobalamin ((VITAMIN B-12)) injection 1,000 mcg    Myra Gianotti, PA-C Surgical Short Stay/Anesthesiology [facility] Phone [phone] [facility] Phone [phone] 03/19/2021 10:30 AM

## 2021-03-19 NOTE — Anesthesia Preprocedure Evaluation (Addendum)
Anesthesia Evaluation  Patient identified by MRN, date of birth, ID band Patient awake    Reviewed: Allergy & Precautions, NPO status , Patient's Chart, lab work & pertinent test results  History of Anesthesia Complications (+) PONV, Family history of anesthesia reaction and history of anesthetic complications  Airway Mallampati: II  TM Distance: >3 FB Neck ROM: Full    Dental  (+) Dental Advisory Given   Pulmonary pneumonia, resolved,    Pulmonary exam normal breath sounds clear to auscultation       Cardiovascular Normal cardiovascular exam+ Valvular Problems/Murmurs  Rhythm:Regular Rate:Normal  Echo 2015 Left ventricle: Fequent PVCs. Overall LVEF is probably low normal at 50% The cavity size was normal. Wall thickness was normal.   -------------------------------------------------------------------  Aortic valve:  Structurally normal valve.  Cusp separation was normal. Doppler: Transvalvular velocity was within the normal range. There was no stenosis. There was no regurgitation.   -------------------------------------------------------------------  Mitral valve:  Structurally normal valve.  Leaflet separation was normal. Doppler: Transvalvular velocity was within the normal range. There was no evidence for stenosis. There was no regurgitation.  Peak gradient (D): 2 mm Hg.   -------------------------------------------------------------------  Left atrium: The atrium was normal in size.   -------------------------------------------------------------------  Right ventricle: The cavity size was normal. Wall thickness was normal. Systolic function was normal.   -------------------------------------------------------------------  Pulmonic valve:  Structurally normal valve.  Cusp separation was normal. Doppler: Transvalvular velocity was within the normal range. There was no regurgitation.    -------------------------------------------------------------------  Tricuspid valve:  Structurally normal valve.  Leaflet separation was normal. Doppler: Transvalvular velocity was within the normal range. There was trivial regurgitation.   -------------------------------------------------------------------  Right atrium: The atrium was normal in size.   -------------------------------------------------------------------  Pericardium: There was no pericardial effusion.      Neuro/Psych  Headaches, PSYCHIATRIC DISORDERS Anxiety Depression    GI/Hepatic Neg liver ROS, GERD  ,  Endo/Other  Hypothyroidism   Renal/GU negative Renal ROS     Musculoskeletal  (+) Arthritis ,   Abdominal   Peds  Hematology negative hematology ROS (+) anemia ,   Anesthesia Other Findings   Reproductive/Obstetrics                            Anesthesia Physical Anesthesia Plan  ASA: 3  Anesthesia Plan: General   Post-op Pain Management:    Induction: Intravenous  PONV Risk Score and Plan: 4 or greater and Ondansetron, Dexamethasone, Treatment may vary due to age or medical condition and Midazolam  Airway Management Planned: Oral ETT  Additional Equipment: None  Intra-op Plan:   Post-operative Plan: Extubation in OR  Informed Consent: I have reviewed the patients History and Physical, chart, labs and discussed the procedure including the risks, benefits and alternatives for the proposed anesthesia with the patient or authorized representative who has indicated his/her understanding and acceptance.     Dental advisory given  Plan Discussed with: CRNA  Anesthesia Plan Comments: (PAT note written 03/19/2021 by Myra Gianotti, PA-C. )      Anesthesia Quick Evaluation

## 2021-03-21 ENCOUNTER — Encounter (HOSPITAL_COMMUNITY): Payer: Self-pay | Admitting: Specialist

## 2021-03-21 ENCOUNTER — Ambulatory Visit (HOSPITAL_COMMUNITY): Payer: Medicare PPO | Admitting: Vascular Surgery

## 2021-03-21 ENCOUNTER — Ambulatory Visit (HOSPITAL_COMMUNITY)
Admission: RE | Admit: 2021-03-21 | Discharge: 2021-03-22 | Disposition: A | Discharge status: Home / Self Care | Payer: Medicare PPO | Attending: Specialist | Admitting: Specialist

## 2021-03-21 ENCOUNTER — Other Ambulatory Visit: Payer: Self-pay

## 2021-03-21 ENCOUNTER — Encounter (HOSPITAL_COMMUNITY)
Admission: RE | Disposition: A | Discharge status: Home / Self Care | Payer: Self-pay | Source: Home / Self Care | Attending: Specialist

## 2021-03-21 ENCOUNTER — Ambulatory Visit (HOSPITAL_COMMUNITY): Payer: Medicare PPO | Admitting: Anesthesiology

## 2021-03-21 ENCOUNTER — Ambulatory Visit (HOSPITAL_COMMUNITY): Payer: Medicare PPO

## 2021-03-21 DIAGNOSIS — Z806 Family history of leukemia: Secondary | ICD-10-CM | POA: Insufficient documentation

## 2021-03-21 DIAGNOSIS — E538 Deficiency of other specified B group vitamins: Secondary | ICD-10-CM

## 2021-03-21 DIAGNOSIS — Z981 Arthrodesis status: Secondary | ICD-10-CM | POA: Diagnosis not present

## 2021-03-21 DIAGNOSIS — Z8051 Family history of malignant neoplasm of kidney: Secondary | ICD-10-CM | POA: Diagnosis not present

## 2021-03-21 DIAGNOSIS — Z8249 Family history of ischemic heart disease and other diseases of the circulatory system: Secondary | ICD-10-CM | POA: Insufficient documentation

## 2021-03-21 DIAGNOSIS — M5416 Radiculopathy, lumbar region: Secondary | ICD-10-CM | POA: Diagnosis not present

## 2021-03-21 DIAGNOSIS — M48061 Spinal stenosis, lumbar region without neurogenic claudication: Secondary | ICD-10-CM | POA: Diagnosis not present

## 2021-03-21 DIAGNOSIS — E782 Mixed hyperlipidemia: Secondary | ICD-10-CM | POA: Diagnosis not present

## 2021-03-21 DIAGNOSIS — D638 Anemia in other chronic diseases classified elsewhere: Secondary | ICD-10-CM | POA: Diagnosis not present

## 2021-03-21 DIAGNOSIS — Z8349 Family history of other endocrine, nutritional and metabolic diseases: Secondary | ICD-10-CM | POA: Insufficient documentation

## 2021-03-21 DIAGNOSIS — Z885 Allergy status to narcotic agent status: Secondary | ICD-10-CM | POA: Insufficient documentation

## 2021-03-21 DIAGNOSIS — K219 Gastro-esophageal reflux disease without esophagitis: Secondary | ICD-10-CM | POA: Insufficient documentation

## 2021-03-21 DIAGNOSIS — E039 Hypothyroidism, unspecified: Secondary | ICD-10-CM | POA: Diagnosis not present

## 2021-03-21 DIAGNOSIS — Z791 Long term (current) use of non-steroidal anti-inflammatories (NSAID): Secondary | ICD-10-CM | POA: Insufficient documentation

## 2021-03-21 DIAGNOSIS — Z888 Allergy status to other drugs, medicaments and biological substances status: Secondary | ICD-10-CM | POA: Diagnosis not present

## 2021-03-21 DIAGNOSIS — Z419 Encounter for procedure for purposes other than remedying health state, unspecified: Secondary | ICD-10-CM

## 2021-03-21 HISTORY — PX: LUMBAR LAMINECTOMY/DECOMPRESSION MICRODISCECTOMY: SHX5026

## 2021-03-21 SURGERY — LUMBAR LAMINECTOMY/DECOMPRESSION MICRODISCECTOMY 1 LEVEL
Anesthesia: General

## 2021-03-21 MED ORDER — ALPRAZOLAM 0.5 MG PO TABS
0.5000 mg | ORAL_TABLET | Freq: Every evening | ORAL | Status: DC | PRN
  Administered 2021-03-22: 0.5 mg via ORAL
  Filled 2021-03-21: qty 1

## 2021-03-21 MED ORDER — THROMBIN 20000 UNITS EX SOLR
CUTANEOUS | Status: AC
  Filled 2021-03-21: qty 20000

## 2021-03-21 MED ORDER — HYDROMORPHONE HCL 1 MG/ML IJ SOLN
0.5000 mg | INTRAMUSCULAR | Status: DC | PRN
  Administered 2021-03-21: 0.5 mg via INTRAVENOUS
  Filled 2021-03-21: qty 0.5

## 2021-03-21 MED ORDER — HYDROMORPHONE HCL 1 MG/ML IJ SOLN
0.2500 mg | INTRAMUSCULAR | Status: DC | PRN
  Administered 2021-03-21: 0.25 mg via INTRAVENOUS

## 2021-03-21 MED ORDER — CEFAZOLIN SODIUM-DEXTROSE 2-4 GM/100ML-% IV SOLN
INTRAVENOUS | Status: AC
  Filled 2021-03-21: qty 100

## 2021-03-21 MED ORDER — CHLORHEXIDINE GLUCONATE 0.12 % MT SOLN
15.0000 mL | Freq: Once | OROMUCOSAL | Status: AC
  Administered 2021-03-21: 15 mL via OROMUCOSAL
  Filled 2021-03-21: qty 15

## 2021-03-21 MED ORDER — LIDOCAINE HCL (CARDIAC) PF 100 MG/5ML IV SOSY
PREFILLED_SYRINGE | INTRAVENOUS | Status: DC | PRN
  Administered 2021-03-21: 100 mg via INTRATRACHEAL

## 2021-03-21 MED ORDER — DEXAMETHASONE SODIUM PHOSPHATE 10 MG/ML IJ SOLN
INTRAMUSCULAR | Status: DC | PRN
  Administered 2021-03-21: 10 mg via INTRAVENOUS

## 2021-03-21 MED ORDER — BUPIVACAINE-EPINEPHRINE 0.5% -1:200000 IJ SOLN
INTRAMUSCULAR | Status: AC
  Filled 2021-03-21: qty 1

## 2021-03-21 MED ORDER — ONDANSETRON HCL 4 MG/2ML IJ SOLN
INTRAMUSCULAR | Status: DC | PRN
  Administered 2021-03-21: 4 mg via INTRAVENOUS

## 2021-03-21 MED ORDER — ROCURONIUM BROMIDE 10 MG/ML (PF) SYRINGE
PREFILLED_SYRINGE | INTRAVENOUS | Status: AC
  Filled 2021-03-21: qty 10

## 2021-03-21 MED ORDER — PHENYLEPHRINE HCL-NACL 10-0.9 MG/250ML-% IV SOLN
INTRAVENOUS | Status: DC | PRN
  Administered 2021-03-21: 25 ug/min via INTRAVENOUS

## 2021-03-21 MED ORDER — CEFAZOLIN SODIUM-DEXTROSE 2-4 GM/100ML-% IV SOLN
2.0000 g | Freq: Three times a day (TID) | INTRAVENOUS | Status: DC
  Administered 2021-03-21 (×2): 2 g via INTRAVENOUS
  Filled 2021-03-21 (×2): qty 100

## 2021-03-21 MED ORDER — OXYCODONE HCL 5 MG PO TABS
5.0000 mg | ORAL_TABLET | ORAL | Status: DC | PRN
  Administered 2021-03-21 – 2021-03-22 (×6): 5 mg via ORAL
  Filled 2021-03-21 (×7): qty 1

## 2021-03-21 MED ORDER — ACETAMINOPHEN 10 MG/ML IV SOLN
1000.0000 mg | INTRAVENOUS | Status: AC
  Administered 2021-03-21: 1000 mg via INTRAVENOUS

## 2021-03-21 MED ORDER — DOCUSATE SODIUM 100 MG PO CAPS: 100.0000 mg | ORAL_CAPSULE | Freq: Two times a day (BID) | ORAL | 1 refills | Status: DC | PRN

## 2021-03-21 MED ORDER — BISACODYL 5 MG PO TBEC: 5.0000 mg | DELAYED_RELEASE_TABLET | Freq: Every day | ORAL | Status: DC | PRN

## 2021-03-21 MED ORDER — VENLAFAXINE HCL ER 75 MG PO CP24
75.0000 mg | ORAL_CAPSULE | Freq: Every day | ORAL | Status: DC
  Administered 2021-03-22: 75 mg via ORAL
  Filled 2021-03-21: qty 1

## 2021-03-21 MED ORDER — LACTATED RINGERS IV SOLN: INTRAVENOUS | Status: DC

## 2021-03-21 MED ORDER — HYDROMORPHONE HCL 1 MG/ML IJ SOLN
INTRAMUSCULAR | Status: AC
  Filled 2021-03-21: qty 1

## 2021-03-21 MED ORDER — ESMOLOL HCL 100 MG/10ML IV SOLN
INTRAVENOUS | Status: DC | PRN
  Administered 2021-03-21 (×2): 20 mg via INTRAVENOUS

## 2021-03-21 MED ORDER — PROPOFOL 500 MG/50ML IV EMUL
INTRAVENOUS | Status: DC | PRN
  Administered 2021-03-21: 120 mg via INTRAVENOUS

## 2021-03-21 MED ORDER — ALUM & MAG HYDROXIDE-SIMETH 200-200-20 MG/5ML PO SUSP: 30.0000 mL | Freq: Four times a day (QID) | ORAL | Status: DC | PRN

## 2021-03-21 MED ORDER — RIZATRIPTAN BENZOATE 10 MG PO TBDP
10.0000 mg | ORAL_TABLET | ORAL | Status: DC | PRN
  Filled 2021-03-21: qty 1

## 2021-03-21 MED ORDER — GABAPENTIN 300 MG PO CAPS
600.0000 mg | ORAL_CAPSULE | Freq: Every day | ORAL | Status: DC
  Administered 2021-03-21: 600 mg via ORAL
  Filled 2021-03-21: qty 2

## 2021-03-21 MED ORDER — 0.9 % SODIUM CHLORIDE (POUR BTL) OPTIME
TOPICAL | Status: DC | PRN
  Administered 2021-03-21: 1000 mL

## 2021-03-21 MED ORDER — ONDANSETRON HCL 4 MG PO TABS: 4.0000 mg | ORAL_TABLET | Freq: Four times a day (QID) | ORAL | Status: DC | PRN

## 2021-03-21 MED ORDER — MENTHOL 3 MG MT LOZG
1.0000 | LOZENGE | OROMUCOSAL | Status: DC | PRN
  Filled 2021-03-21: qty 9

## 2021-03-21 MED ORDER — BUPIVACAINE-EPINEPHRINE 0.5% -1:200000 IJ SOLN
INTRAMUSCULAR | Status: DC | PRN
  Administered 2021-03-21: 5 mL

## 2021-03-21 MED ORDER — ONDANSETRON HCL 4 MG/2ML IJ SOLN
INTRAMUSCULAR | Status: AC
  Filled 2021-03-21: qty 2

## 2021-03-21 MED ORDER — METHOCARBAMOL 500 MG PO TABS
500.0000 mg | ORAL_TABLET | Freq: Four times a day (QID) | ORAL | Status: DC | PRN
  Administered 2021-03-21 – 2021-03-22 (×4): 500 mg via ORAL
  Filled 2021-03-21 (×4): qty 1

## 2021-03-21 MED ORDER — ONDANSETRON HCL 4 MG/2ML IJ SOLN: 4.0000 mg | Freq: Four times a day (QID) | INTRAMUSCULAR | Status: DC | PRN

## 2021-03-21 MED ORDER — THROMBIN 20000 UNITS EX SOLR
CUTANEOUS | Status: DC | PRN
  Administered 2021-03-21: 20 mL via TOPICAL

## 2021-03-21 MED ORDER — LEVOTHYROXINE SODIUM 75 MCG PO TABS
75.0000 ug | ORAL_TABLET | Freq: Every day | ORAL | Status: DC
  Administered 2021-03-22: 75 ug via ORAL
  Filled 2021-03-21: qty 1

## 2021-03-21 MED ORDER — PROPOFOL 10 MG/ML IV BOLUS
INTRAVENOUS | Status: AC
  Filled 2021-03-21: qty 20

## 2021-03-21 MED ORDER — ORAL CARE MOUTH RINSE: 15.0000 mL | Freq: Once | OROMUCOSAL | Status: AC

## 2021-03-21 MED ORDER — ROCURONIUM BROMIDE 100 MG/10ML IV SOLN
INTRAVENOUS | Status: DC | PRN
  Administered 2021-03-21: 50 mg via INTRAVENOUS
  Administered 2021-03-21: 30 mg via INTRAVENOUS

## 2021-03-21 MED ORDER — ACETAMINOPHEN 650 MG RE SUPP: 650.0000 mg | RECTAL | Status: DC | PRN

## 2021-03-21 MED ORDER — DEXAMETHASONE SODIUM PHOSPHATE 10 MG/ML IJ SOLN
INTRAMUSCULAR | Status: AC
  Filled 2021-03-21: qty 1

## 2021-03-21 MED ORDER — LIDOCAINE 2% (20 MG/ML) 5 ML SYRINGE
INTRAMUSCULAR | Status: AC
  Filled 2021-03-21: qty 5

## 2021-03-21 MED ORDER — OXYCODONE HCL 5 MG PO TABS: 5.0000 mg | ORAL_TABLET | Freq: Four times a day (QID) | ORAL | 0 refills | Status: DC | PRN

## 2021-03-21 MED ORDER — FENTANYL CITRATE (PF) 250 MCG/5ML IJ SOLN
INTRAMUSCULAR | Status: AC
  Filled 2021-03-21: qty 5

## 2021-03-21 MED ORDER — PROMETHAZINE HCL 25 MG/ML IJ SOLN: 6.2500 mg | INTRAMUSCULAR | Status: DC | PRN

## 2021-03-21 MED ORDER — ACETAMINOPHEN 10 MG/ML IV SOLN
INTRAVENOUS | Status: AC
  Filled 2021-03-21: qty 100

## 2021-03-21 MED ORDER — POLYETHYLENE GLYCOL 3350 17 G PO PACK: 17.0000 g | PACK | Freq: Every day | ORAL | Status: DC | PRN

## 2021-03-21 MED ORDER — ACETAMINOPHEN 325 MG PO TABS
650.0000 mg | ORAL_TABLET | ORAL | Status: DC | PRN
  Administered 2021-03-21: 650 mg via ORAL
  Filled 2021-03-21: qty 2

## 2021-03-21 MED ORDER — SUGAMMADEX SODIUM 200 MG/2ML IV SOLN
INTRAVENOUS | Status: DC | PRN
  Administered 2021-03-21: 160 mg via INTRAVENOUS

## 2021-03-21 MED ORDER — POLYETHYLENE GLYCOL 3350 17 G PO PACK: 17.0000 g | PACK | Freq: Every day | ORAL | 0 refills | Status: DC

## 2021-03-21 MED ORDER — METHOCARBAMOL 1000 MG/10ML IJ SOLN
500.0000 mg | Freq: Four times a day (QID) | INTRAVENOUS | Status: DC | PRN
  Filled 2021-03-21: qty 5

## 2021-03-21 MED ORDER — CEFAZOLIN SODIUM-DEXTROSE 2-4 GM/100ML-% IV SOLN
2.0000 g | INTRAVENOUS | Status: AC
  Administered 2021-03-21: 2 g via INTRAVENOUS

## 2021-03-21 MED ORDER — KCL IN DEXTROSE-NACL 20-5-0.45 MEQ/L-%-% IV SOLN: INTRAVENOUS | Status: AC

## 2021-03-21 MED ORDER — MEPERIDINE HCL 25 MG/ML IJ SOLN: 6.2500 mg | INTRAMUSCULAR | Status: DC | PRN

## 2021-03-21 MED ORDER — FENTANYL CITRATE (PF) 100 MCG/2ML IJ SOLN
INTRAMUSCULAR | Status: DC | PRN
  Administered 2021-03-21 (×3): 50 ug via INTRAVENOUS

## 2021-03-21 MED ORDER — DOCUSATE SODIUM 100 MG PO CAPS
100.0000 mg | ORAL_CAPSULE | Freq: Two times a day (BID) | ORAL | Status: DC
  Administered 2021-03-21 – 2021-03-22 (×3): 100 mg via ORAL
  Filled 2021-03-21 (×3): qty 1

## 2021-03-21 MED ORDER — PHENOL 1.4 % MT LIQD: 1.0000 | OROMUCOSAL | Status: DC | PRN

## 2021-03-21 MED ORDER — MAGNESIUM CITRATE PO SOLN: 1.0000 | Freq: Once | ORAL | Status: DC | PRN

## 2021-03-21 SURGICAL SUPPLY — 59 items
BAG COUNTER SPONGE SURGICOUNT (BAG) ×2 IMPLANT
BAG DECANTER FOR FLEXI CONT (MISCELLANEOUS) ×2 IMPLANT
BAG SPNG CNTER NS LX DISP (BAG) ×1
BAND INSRT 18 STRL LF DISP RB (MISCELLANEOUS) ×2
BAND RUBBER #18 3X1/16 STRL (MISCELLANEOUS) ×4 IMPLANT
BUR STRYKR EGG 5.0 (BURR) IMPLANT
CLEANER TIP ELECTROSURG 2X2 (MISCELLANEOUS) ×2 IMPLANT
CNTNR URN SCR LID CUP LEK RST (MISCELLANEOUS) ×1 IMPLANT
CONT SPEC 4OZ STRL OR WHT (MISCELLANEOUS) ×2
DRAPE LAPAROTOMY 100X72X124 (DRAPES) ×2 IMPLANT
DRAPE MICROSCOPE LEICA (MISCELLANEOUS) ×2 IMPLANT
DRAPE SHEET LG 3/4 BI-LAMINATE (DRAPES) ×2 IMPLANT
DRAPE SURG 17X11 SM STRL (DRAPES) ×2 IMPLANT
DRAPE UTILITY XL STRL (DRAPES) ×2 IMPLANT
DRSG AQUACEL AG ADV 3.5X 4 (GAUZE/BANDAGES/DRESSINGS) IMPLANT
DRSG AQUACEL AG ADV 3.5X 6 (GAUZE/BANDAGES/DRESSINGS) ×1 IMPLANT
DRSG OPSITE POSTOP 3X4 (GAUZE/BANDAGES/DRESSINGS) ×1 IMPLANT
DRSG TELFA 3X8 NADH (GAUZE/BANDAGES/DRESSINGS) IMPLANT
DURAPREP 26ML APPLICATOR (WOUND CARE) ×2 IMPLANT
DURASEAL SPINE SEALANT 3ML (MISCELLANEOUS) IMPLANT
ELECT BLADE 4.0 EZ CLEAN MEGAD (MISCELLANEOUS)
ELECT REM PT RETURN 9FT ADLT (ELECTROSURGICAL) ×2
ELECTRODE BLDE 4.0 EZ CLN MEGD (MISCELLANEOUS) IMPLANT
ELECTRODE REM PT RTRN 9FT ADLT (ELECTROSURGICAL) ×1 IMPLANT
EVACUATOR 1/8 PVC DRAIN (DRAIN) ×1 IMPLANT
GLOVE SURG POLYISO LF SZ7.5 (GLOVE) ×2 IMPLANT
GLOVE SURG POLYISO LF SZ8 (GLOVE) ×4 IMPLANT
GLOVE SURG UNDER POLY LF SZ7 (GLOVE) ×2 IMPLANT
GLOVE SURG UNDER POLY LF SZ7.5 (GLOVE) ×2 IMPLANT
GOWN STRL REUS W/ TWL LRG LVL3 (GOWN DISPOSABLE) ×1 IMPLANT
GOWN STRL REUS W/ TWL XL LVL3 (GOWN DISPOSABLE) ×1 IMPLANT
GOWN STRL REUS W/TWL LRG LVL3 (GOWN DISPOSABLE) ×2
GOWN STRL REUS W/TWL XL LVL3 (GOWN DISPOSABLE) ×4
IV CATH 14GX2 1/4 (CATHETERS) ×2 IMPLANT
KIT BASIN OR (CUSTOM PROCEDURE TRAY) ×2 IMPLANT
NDL SPNL 18GX3.5 QUINCKE PK (NEEDLE) ×2 IMPLANT
NEEDLE 22X1 1/2 (OR ONLY) (NEEDLE) ×2 IMPLANT
NEEDLE SPNL 18GX3.5 QUINCKE PK (NEEDLE) ×4 IMPLANT
PACK LAMINECTOMY NEURO (CUSTOM PROCEDURE TRAY) ×2 IMPLANT
PAD DRESSING TELFA 3X8 NADH (GAUZE/BANDAGES/DRESSINGS) IMPLANT
PATTIES SURGICAL .5 X.5 (GAUZE/BANDAGES/DRESSINGS) ×1 IMPLANT
PATTIES SURGICAL .75X.75 (GAUZE/BANDAGES/DRESSINGS) ×2 IMPLANT
SPONGE SURGIFOAM ABS GEL 100 (HEMOSTASIS) ×2 IMPLANT
SPONGE T-LAP 4X18 ~~LOC~~+RFID (SPONGE) ×1 IMPLANT
STAPLER VISISTAT (STAPLE) IMPLANT
STRIP CLOSURE SKIN 1/2X4 (GAUZE/BANDAGES/DRESSINGS) ×1 IMPLANT
SUT NURALON 4 0 TR CR/8 (SUTURE) IMPLANT
SUT PROLENE 3 0 PS 2 (SUTURE) IMPLANT
SUT VIC AB 1 CT1 27 (SUTURE)
SUT VIC AB 1 CT1 27XBRD ANTBC (SUTURE) IMPLANT
SUT VIC AB 1-0 CT2 27 (SUTURE) ×2 IMPLANT
SUT VIC AB 2-0 CT1 27 (SUTURE) ×2
SUT VIC AB 2-0 CT1 TAPERPNT 27 (SUTURE) IMPLANT
SUT VIC AB 2-0 CT2 27 (SUTURE) IMPLANT
SYR 3ML LL SCALE MARK (SYRINGE) ×2 IMPLANT
TOWEL GREEN STERILE (TOWEL DISPOSABLE) ×2 IMPLANT
TOWEL GREEN STERILE FF (TOWEL DISPOSABLE) ×2 IMPLANT
TRAY FOLEY MTR SLVR 16FR STAT (SET/KITS/TRAYS/PACK) ×2 IMPLANT
YANKAUER SUCT BULB TIP NO VENT (SUCTIONS) ×2 IMPLANT

## 2021-03-21 NOTE — Interval H&P Note (Signed)
History and Physical Interval Note:  03/21/2021 7:18 AM  [name] Nolan  has presented today for surgery, with the diagnosis of Stenosis L4-5.  The various methods of treatment have been discussed with the patient and family. After consideration of risks, benefits and other options for treatment, the patient has consented to  Procedure(s) with comments: Microlumbar decompression L4-5 Central (N/A) - 2 hrs as a surgical intervention.  The patient's history has been reviewed, patient examined, no change in status, stable for surgery.  I have reviewed the patient's chart and labs.  Questions were answered to the patient's satisfaction.     [name]

## 2021-03-21 NOTE — Evaluation (Signed)
Occupational Therapy Evaluation Patient Details Name: [name] Nolan MRN: [id number] DOB: 03-31-1946 Today's Date: 03/21/2021    History of Present Illness 75 y.o. female s/p bil microlumbar decompression L4-5 with bil hemilaminotomies and foraminotomies. PMH significant for anxiety, arthritis, depression, Hashimotos disease, heart murmur, and osteoporosis.   Clinical Impression   PTA, pt was living alone and was independent. Currently, pt performing ADLs and functional mobility at Mod I level. Provided education handout on back precautions, bed mobility, grooming, LB ADLs, toileting, and shower transfer; pt demonstrated understanding. Answered all pt questions. Recommend dc home once medically stable per physician. All acute OT needs met and will sign off. Thank you.    Follow Up Recommendations  No OT follow up    Equipment Recommendations  None recommended by OT    Recommendations for Other Services       Precautions / Restrictions Precautions Precautions: Back Precaution Booklet Issued: Yes (comment) Precaution Comments: Add education reviewed and pt verbalizing understanding. Restrictions Weight Bearing Restrictions: No      Mobility Bed Mobility Overal bed mobility: Modified Independent             General bed mobility comments: Performing log roll technique with mod I after education.    Transfers Overall transfer level: Modified independent Equipment used: None             General transfer comment: Pt performing transfers with mod I for increased time.    Balance Overall balance assessment: Modified Independent                                         ADL either performed or assessed with clinical judgement   ADL Overall ADL's : Modified independent                                       General ADL Comments: Mod I for increased time. Pt educated and demonstrating understanding of compensatory techniques for  LB dressing, shower transfers, toileting, and oral care.     Vision Baseline Vision/History: Wears glasses Wears Glasses: At all times Patient Visual Report: No change from baseline Vision Assessment?: No apparent visual deficits     Perception Perception Perception Tested?: No Comments: no apprent difficulty with perception   Praxis Praxis Praxis tested?: Not tested Praxis-Other Comments: no apparent difficulty with motor planning    Pertinent Vitals/Pain Pain Assessment: Faces Faces Pain Scale: Hurts a little bit Pain Location: back Pain Descriptors / Indicators: Discomfort;Guarding;Operative site guarding Pain Intervention(s): Limited activity within patient's tolerance;Monitored during session     Hand Dominance Right   Extremity/Trunk Assessment Upper Extremity Assessment Upper Extremity Assessment: Generalized weakness   Lower Extremity Assessment Lower Extremity Assessment: Defer to PT evaluation   Cervical / Trunk Assessment Cervical / Trunk Assessment: Other exceptions Cervical / Trunk Exceptions: s/p lumbar spinal surgery   Communication Communication Communication: No difficulties   Cognition Arousal/Alertness: Awake/alert Behavior During Therapy: WFL for tasks assessed/performed Overall Cognitive Status: Within Functional Limits for tasks assessed                                 General Comments: Pt pleasant and conversational throughout session. Worried about her dogs and her neighbor   General Comments  Pt sister  present at beginning of session.    Exercises     Shoulder Instructions      Home Living Family/patient expects to be discharged to:: Private residence Living Arrangements: Alone Available Help at Discharge: Family Type of Home: House Home Access: Stairs to enter [employer] of Steps: 6 total to enter Entrance Stairs-Rails: Right;Left;Can reach both Home Layout: One level     Bathroom Shower/Tub: Museum/gallery curator Accessibility: Yes How Accessible: Accessible via wheelchair Home Equipment: [place] - 2 wheels;Grab bars - tub/shower;Shower seat;Hand held shower head   Additional Comments: Pt reporting her home is accessible as she has made updates for her husband who has dementia.      Prior Functioning/Environment Level of Independence: Independent        Comments: Pt was independent in ADLs, IADLs, and driving        OT Problem List: Decreased strength;Decreased activity tolerance;Impaired balance (sitting and/or standing);Decreased knowledge of precautions;Pain      OT Treatment/Interventions:      OT Goals(Current goals can be found in the care plan section) Acute Rehab OT Goals Patient Stated Goal: "go home" OT Goal Formulation: With patient  OT Frequency:     Barriers to D/C:            Co-evaluation              AM-PAC OT "6 Clicks" Daily Activity     Outcome Measure Help from another person eating meals?: None Help from another person taking care of personal grooming?: None Help from another person toileting, which includes using toliet, bedpan, or urinal?: None Help from another person bathing (including washing, rinsing, drying)?: None Help from another person to put on and taking off regular upper body clothing?: None Help from another person to put on and taking off regular lower body clothing?: None 6 Click Score: 24   End of Session Nurse Communication: Mobility status  Activity Tolerance: Patient tolerated treatment well Patient left: in bed;with call bell/phone within reach  OT Visit Diagnosis: Unsteadiness on feet (R26.81);Muscle weakness (generalized) (M62.81);Pain Pain - part of body:  (back)                Time: 7673-4193 OT Time Calculation (min): 22 min Charges:  OT General Charges $OT Visit: 1 Visit OT Evaluation $OT Eval Low Complexity: Time, OTR/L Acute Rehab Pager: [phone] Office:  [place] 03/21/2021, 4:50 PM

## 2021-03-21 NOTE — Transfer of Care (Signed)
Immediate Anesthesia Transfer of Care Note  Patient: [name] Nolan  Procedure(s) Performed: Microlumbar decompression Lumbar four-five Central  Patient Location: PACU  Anesthesia Type:General  Level of Consciousness: awake, drowsy and patient cooperative  Airway & Oxygen Therapy: Patient Spontanous Breathing and Patient connected to face mask oxygen  Post-op Assessment: Report given to RN and Post -op Vital signs reviewed and stable  Post vital signs: Reviewed and stable  Last Vitals:  Vitals Value Taken Time  BP    Temp    Pulse    Resp    SpO2      Last Pain:  Vitals:   03/21/21 0626  TempSrc:   PainSc: 5       Patients Stated Pain Goal: 3 (96/72/89 7915)  Complications: No notable events documented.

## 2021-03-21 NOTE — Op Note (Signed)
NAME: [name] Nolan, [name] Nolan MEDICAL RECORD NO: [id number] ACCOUNT NO: [id number] DATE OF BIRTH: [birth year] FACILITY: [facility] LOCATION: [facility] PHYSICIAN: [name], MD  Operative Report   DATE OF PROCEDURE: 03/21/2021  PREOPERATIVE DIAGNOSIS:  Spinal stenosis at L4-L5.  POSTOPERATIVE DIAGNOSIS:  Spinal stenosis at L4-L5.  PROCEDURE PERFORMED:  Bilateral microlumbar decompression L4-L5 with bilateral hemilaminotomies and foraminotomies, L4-L5.  ANESTHESIA:  General.  ASSISTANT:  Lacie Draft, PA  HISTORY:  A 75 year old with predominantly right lower extremity radicular pain as well as left L5 nerve root distribution due to lateral recess stenosis due to facet and ligamentum flavum hypertrophy.  She is indicated for microlumbar decompression  bilaterally.  Risks and benefits discussed including bleeding, infection, damage to neurovascular structures, no change in symptoms, worsening symptoms, DVT, PE, anesthetic complications, etc.  DESCRIPTION OF PROCEDURE:  With the patient in supine position, after induction of adequate general anesthesia, Foley to gravity, she was placed prone on the Wilson frame.  All bony prominences were well padded.  Lumbar region was prepped and draped in  the usual sterile fashion.  Two 18-gauge spinal needles were utilized to localize the L4-L5 interspace.  Incision was made in the skin after infiltration with 0.25% Marcaine with epinephrine.  Subcutaneous tissue was dissected.  Electrocautery was  utilized to achieve hemostasis.  Dorsal lumbar fascia was divided in line of skin incision.  Paraspinous muscle elevated from the lamina of L4 and L5 bilaterally.  McCulloch retractor was placed.  Operating microscope was draped, brought in the surgical  field after confirmatory radiograph obtained.  The patient had a very small interlaminar window bilaterally.  Therefore, I proceeded with first centrally.  We removed a portion of the spinous process of L4 and  of L5.  I then used a knife to perform  hemilaminotomies of the caudad edge of L4 with a 2 and 3 mm Kerrison preserving the pars.  I then used a micro curette to skeletonize the lamina of L5 identifying the ligamentum flavum.  We detached ligamentum flavum from the caudad edge of L4 centrally  and bilaterally.  I began removing ligamentum flavum from the interspace first centrally and then using a Woodson retractor to protect the neural elements.  I removed ligamentum flavum on the right and on the left, first on the left.  I detached as well  ligamentum flavum from the cephalad edge of L5 utilizing a micro curette.  I placed the padding beneath the ligamentum flavum.  I then removed hypertrophic ligamentum bilaterally, first on the left and then the right, severely stenotic bilateral lateral  recesses, right greater than left.  I undercut the facet on the right with a 2 mm Kerrison, protecting the neural elements.  I performed a foraminotomy at L5 and L4.  I identified the nerve root.  Also, on the left, decompressing the lateral recess the  medial border of the pedicle.  I performed a foraminotomy at L5 and L4.  The patient had epidural venous plexus laterally on the right.  Following the decompression, we used bipolar electrocautery.  Then, small thrombin-soaked Gelfoam with neuro patties  placed in the lateral recess to allow hemostasis.  The dorsal portion of the central portion of the thecal sac was somewhat attenuated, but intact.  I obtained confirmatory radiographs Penfield 4 in the interspace.  A Woodson probe passed freely out the  foramen of L4 and L5.  On the lateral recess bilaterally, I did not appreciate a disc herniation.  I had small thrombin-soaked Gelfoam and  patties, 2 x 2 mm.  They were placed in the lateral recess just lateral to the nerve root over epidural venous  plexus.  I placed bone wax on all cancellous surfaces.  I felt there was excellent decompression of the lateral recesses  bilaterally.  I copiously irrigated the wound.  Inspection revealed no evidence of CSF leakage.  Bipolar electrocautery was utilized  to achieve hemostasis.  The pars was intact.  I removed the McCulloch retractor, irrigated the paraspinous musculature.  Any bleeding was cauterized.  There was a period that we had allowed for hemostasis of the epidural venous plexus as there was some  bleeding associated with that, but after cautery and the Gelfoam and leaving it there for a period of time, we had hemostasis. I felt prudent that it would be appropriate to place a drain.  I placed a Hemovac drain and brought out through a stab wound  into the fascia and the subcutaneous tissue, and I placed the tip of it in the paraspinous musculature along the remaining lamina and spinous process of L4.  Following this, there was no active bleeding.  I repaired the dorsal lumbar fascia with #1  Vicryl in interrupted figure-of-eight sutures, subcutaneous with multiple layers of 2-0 and skin with staples.  Wound was dressed sterilely, placed supine on the hospital bed, extubated without difficulty, and transported to the recovery room in  satisfactory condition.  The patient tolerated the procedure well.  There were no complications.  Assistant Lacie Draft, [place].  Blood loss 100 mL.   [facility] D: 03/21/2021 10:18:49 am T: 03/21/2021 11:19:00 am  JOB: [id number]/ [id number]

## 2021-03-21 NOTE — Anesthesia Procedure Notes (Signed)
Procedure Name: Intubation Date/Time: 03/21/2021 7:38 AM Performed by: Nolon Nations, MD Pre-anesthesia Checklist: Patient identified, Emergency Drugs available, Suction available, Patient being monitored and Timeout performed Patient Re-evaluated:Patient Re-evaluated prior to induction Oxygen Delivery Method: Circle system utilized Preoxygenation: Pre-oxygenation with 100% oxygen Induction Type: IV induction Ventilation: Oral airway inserted - appropriate to patient size and Mask ventilation with difficulty Laryngoscope Size: Mac and 3 Grade View: Grade I Tube type: Oral Tube size: 7.0 mm Number of attempts: 1 Airway Equipment and Method: Stylet Placement Confirmation: ETT inserted through vocal cords under direct vision, positive ETCO2, CO2 detector and breath sounds checked- equal and bilateral Secured at: 22 cm Tube secured with: Tape Dental Injury: Teeth and Oropharynx as per pre-operative assessment

## 2021-03-21 NOTE — Discharge Instructions (Addendum)
Walk As Tolerated utilizing back precautions.   No bending, twisting, or lifting.   No driving for 2 weeks.    Aquacel dressing may remain in place until follow up.   May shower with aquacel dressing in place.   If the dressing peels off or becomes saturated, you may remove aquacel dressing and place gauze and tape dressing which should be kept clean and dry and changed daily.   Do not remove steri-strips if they are present.  See Dr. Tonita Cong in office in 10 to 14 days.   Begin taking aspirin 81mg  per day starting 4 days after your surgery on Monday 03/25/21 if not allergic to aspirin or on another blood thinner.  Walk daily even outside.   Use a cane or walker only if necessary.  Avoid sitting on soft sofas.

## 2021-03-21 NOTE — Brief Op Note (Signed)
03/21/2021  10:10 AM  PATIENT:  [name] Nolan  75 y.o. female  PRE-OPERATIVE DIAGNOSIS:  Stenosis Lumbar four-five  POST-OPERATIVE DIAGNOSIS:  Stenosis Lumbar four-five  PROCEDURE:  Procedure(s): Microlumbar decompression Lumbar four-five Central (N/A)  SURGEON:  Surgeon(s) and Role:    * Susa Day, MD - Primary  PHYSICIAN ASSISTANT:   ASSISTANTS: [place]   ANESTHESIA:   general  EBL:  100 mL   BLOOD ADMINISTERED:none  DRAINS:  Hemovac    LOCAL MEDICATIONS USED:  MARCAINE     SPECIMEN:  No Specimen  DISPOSITION OF SPECIMEN:  N/A  COUNTS:  YES  TOURNIQUET:  * No tourniquets in log *  DICTATION: .Other Dictation: Dictation Number [id number]  PLAN OF CARE: Admit for overnight observation  PATIENT DISPOSITION:  PACU - hemodynamically stable.   Delay start of Pharmacological VTE agent (>24hrs) due to surgical blood loss or risk of bleeding: yes

## 2021-03-21 NOTE — Anesthesia Postprocedure Evaluation (Signed)
Anesthesia Post Note  Patient: [name] Nolan  Procedure(s) Performed: Microlumbar decompression Lumbar four-five Central     Patient location during evaluation: PACU Anesthesia Type: General Level of consciousness: sedated and patient cooperative Pain management: pain level controlled Vital Signs Assessment: post-procedure vital signs reviewed and stable Respiratory status: spontaneous breathing Cardiovascular status: stable Anesthetic complications: no   No notable events documented.  Last Vitals:  Vitals:   03/21/21 1700 03/21/21 1914  BP: 135/79 122/86  Pulse: 66 65  Resp: 18 18  Temp: 36.6 C (!) 36.4 C  SpO2: 93% 96%    Last Pain:  Vitals:   03/21/21 1915  TempSrc:   PainSc: [place]

## 2021-03-22 ENCOUNTER — Encounter (HOSPITAL_COMMUNITY): Payer: Self-pay | Admitting: Specialist

## 2021-03-22 DIAGNOSIS — Z888 Allergy status to other drugs, medicaments and biological substances status: Secondary | ICD-10-CM | POA: Diagnosis not present

## 2021-03-22 DIAGNOSIS — K219 Gastro-esophageal reflux disease without esophagitis: Secondary | ICD-10-CM | POA: Diagnosis not present

## 2021-03-22 DIAGNOSIS — Z8051 Family history of malignant neoplasm of kidney: Secondary | ICD-10-CM | POA: Diagnosis not present

## 2021-03-22 DIAGNOSIS — M48061 Spinal stenosis, lumbar region without neurogenic claudication: Secondary | ICD-10-CM | POA: Diagnosis not present

## 2021-03-22 DIAGNOSIS — Z806 Family history of leukemia: Secondary | ICD-10-CM | POA: Diagnosis not present

## 2021-03-22 DIAGNOSIS — M5416 Radiculopathy, lumbar region: Secondary | ICD-10-CM | POA: Diagnosis not present

## 2021-03-22 DIAGNOSIS — Z885 Allergy status to narcotic agent status: Secondary | ICD-10-CM | POA: Diagnosis not present

## 2021-03-22 DIAGNOSIS — Z8249 Family history of ischemic heart disease and other diseases of the circulatory system: Secondary | ICD-10-CM | POA: Diagnosis not present

## 2021-03-22 DIAGNOSIS — E039 Hypothyroidism, unspecified: Secondary | ICD-10-CM | POA: Diagnosis not present

## 2021-03-22 LAB — CBC
HCT: 37.9 % (ref 36.0–46.0)
Hemoglobin: 12.4 g/dL (ref 12.0–15.0)
MCH: 30.2 pg (ref 26.0–34.0)
MCHC: 32.7 g/dL (ref 30.0–36.0)
MCV: 92.4 fL (ref 80.0–100.0)
Platelets: 225 10*3/uL (ref 150–400)
RBC: 4.1 MIL/uL (ref 3.87–5.11)
RDW: 13.9 % (ref 11.5–15.5)
WBC: 8.9 10*3/uL (ref 4.0–10.5)
nRBC: 0 % (ref 0.0–0.2)

## 2021-03-22 NOTE — Discharge Summary (Signed)
Physician Discharge Summary   Patient ID: [name] Nolan MRN: [id number] DOB/AGE: Aug 27, 1946 75 y.o.  Admit date: 03/21/2021 Discharge date: 03/22/2021  Primary Diagnosis:   Stenosis Lumbar four-five  Admission Diagnoses:  Past Medical History:  Diagnosis Date   Absolute anemia 11/17/2017   Acute bronchitis 08/15/2015   Acute pain of left knee 07/31/2018   Anxiety    Anxiety state 05/07/2014   Widowed in 2013 after caring for her husband with Lewy Body Dementia for 6 years    Arrhythmia 01/25/2015   Per Dr. Jaynee Eagles, [facility]; hx PVC   Arthritis    "knees" (11/07/2015)   Arthritis of knee, degenerative 03/02/2017   Basal cell carcinoma of right ear 02/26/2015   Removed by Dr Syble Creek   Chronic back pain    "mid-back; stops at the very lowest part of my back" (11/07/2015)   Chronic migraine w/o aura w/o status migrainosus, not intractable 01/17/2016   Colon cancer screening 12/13/2014   Colon polyp 09/03/2017   Constipation 02/25/2016   Constipation 02/25/2016   Depression    Dysphagia    Dysphagia, pharyngoesophageal phase 12/13/2014   Dyspnea on exertion 12/13/2014   Esophageal reflux    occ   Excessive daytime sleepiness 01/25/2015   Fainting    fainted twice   Family history of adverse reaction to anesthesia    "daughter gets bad PONV"   Gallbladder problem    Hashimoto's disease    Heart murmur    Hot flashes 05/27/2016   Hot flashes 05/27/2016   Hyperlipidemia    Hypothyroid    Insomnia    Joint pain    Low ferritin 08/27/2015   "took supplements for awhile" (11/07/2015)   Low ferritin 08/27/2015   Medicare annual wellness visit, subsequent 08/27/2015   Menopause    Migraine    "under control w/daily RX right now" (11/07/2015)   Morning headache 01/25/2015   Occipital neuralgia    Osteopenia 02/26/2015   Osteoporosis    osteopenia   Other fatigue 11/02/2017   Pneumonia    PONV (postoperative nausea and vomiting) 1974   after  cholecystectomy   Preventative health care 08/27/2015   Prolonged depressive reaction    Shortness of breath on exertion 11/02/2017   Skin cancer 02/26/2015   Right ear Removed by Dr Syble Creek   Swallowing difficulty    Vitamin B12 deficiency 09/01/2016   Vitamin D deficiency 03/02/2017   Discharge Diagnoses:   Active Problems:   Spinal stenosis at L4-L5 level  Procedure:  Procedure(s) (LRB): Microlumbar decompression Lumbar four-five Central (N/A)   Consults: None  HPI:  See H&P    Laboratory Data: Hospital Outpatient Visit on 03/05/2021  Component Date Value Ref Range Status   MRSA, PCR 03/05/2021 NEGATIVE  NEGATIVE Final   Staphylococcus aureus 03/05/2021 NEGATIVE  NEGATIVE Final   Comment: (NOTE) The Xpert SA Assay (FDA approved for NASAL specimens in patients 52 years of age and older), is one component of a comprehensive surveillance program. It is not intended to diagnose infection nor to guide or monitor treatment. Performed at [facility], [place] [address] [place], [place] [address]    SARS Coronavirus 2 03/05/2021 POSITIVE (A) NEGATIVE Final   Comment: (NOTE) SARS-CoV-2 target nucleic acids are DETECTED.  The SARS-CoV-2 RNA is generally detectable in upper and lower respiratory specimens during the acute phase of infection. Positive results are indicative of the presence of SARS-CoV-2 RNA. Clinical correlation with patient history and other diagnostic information is  necessary  to determine patient infection status. Positive results do not rule out bacterial infection or co-infection with other viruses.  The expected result is Negative.  Fact Sheet for Patients: SugarRoll.be  Fact Sheet for Healthcare Providers: https://www.woods-mathews.com/  This test is not yet approved or cleared by the [place] FDA and  has been authorized for detection and/or diagnosis of SARS-CoV-2 by FDA under an Emergency Use  Authorization (EUA). This EUA will remain  in effect (meaning this test can be used) for the duration of the COVID-19 declaration under Section 564(b)(1) of the Act, 21 U.                          S.C. section 360bbb-3(b)(1), unless the authorization is terminated or revoked sooner.   Performed at [facility], [place] [address] [place], [place] [address]    WBC 03/05/2021 5.7  4.0 - 10.5 K/uL Final   RBC 03/05/2021 4.81  3.87 - 5.11 MIL/uL Final   Hemoglobin 03/05/2021 14.4  12.0 - 15.0 g/dL Final   HCT 03/05/2021 43.9  36.0 - 46.0 % Final   MCV 03/05/2021 91.3  80.0 - 100.0 fL Final   MCH 03/05/2021 29.9  26.0 - 34.0 pg Final   MCHC 03/05/2021 32.8  30.0 - 36.0 g/dL Final   RDW 03/05/2021 13.5  11.5 - 15.5 % Final   Platelets 03/05/2021 242  150 - 400 K/uL Final   nRBC 03/05/2021 0.0  0.0 - 0.2 % Final   Performed at [place] [facility] [address] [place], [place] [address]   Sodium 03/05/2021 138  135 - 145 mmol/L Final   Potassium 03/05/2021 3.9  3.5 - 5.1 mmol/L Final   Chloride 03/05/2021 107  98 - 111 mmol/L Final   CO2 03/05/2021 23  22 - 32 mmol/L Final   Glucose, Bld 03/05/2021 91  70 - 99 mg/dL Final   Glucose reference range applies only to samples taken after fasting for at least 8 hours.   BUN 03/05/2021 11  8 - 23 mg/dL Final   Creatinine, Ser 03/05/2021 0.95  0.44 - 1.00 mg/dL Final   Calcium 03/05/2021 9.8  8.9 - 10.3 mg/dL Final   GFR, Estimated 03/05/2021 >60  >60 mL/min Final   Comment: (NOTE) Calculated using the CKD-EPI Creatinine Equation (2021)    Anion gap 03/05/2021 8  5 - 15 Final   Performed at [facility], [place] [address], [place], [place] [address]   Recent Labs    03/22/21 0453  HGB 12.4   Recent Labs    03/22/21 0453  WBC 8.9  RBC 4.10  HCT 37.9  PLT 225   No results for input(s): NA, K, CL, CO2, BUN, CREATININE, GLUCOSE, CALCIUM in the last 72 hours. No results for input(s): LABPT, INR in the last 72  hours.  X-Rays:DG Lumbar Spine 2-3 Views  Result Date: 03/21/2021 CLINICAL DATA:  Surgery, elective. Additional history provided: Intraoperative images for localization. EXAM: LUMBAR SPINE - 2-3 VIEW COMPARISON:  Radiographs of the lumbar spine 03/05/2021. Lumbar CT myelogram 01/09/2021. FINDINGS: Three lateral view intraoperative radiographs of the lumbosacral spine are submitted. The lowest well-formed intervertebral disc space is designated L5-S1. On the earliest provided image acquired at 8:26 a.m., metallic site markers project posterior to the lumbosacral spine at the L4-L5 and L5-S1 levels. On the subsequent provided image acquired at 8:10 a.m., surgical instruments, retractors and a surgical sponge/packing material project posterior to  the L4-L5 level. On the final image acquired at 8:56 a.m., a surgical instrument is present with tip projecting over the spinal canal at the L5 vertebral body level, and there are retractors posterior to the lumbar spine at the L4-L5 level. IMPRESSION: Three intraoperative lateral view radiographs of the lumbosacral spine, as described. Electronically Signed   By: Kellie Simmering DO   On: 03/21/2021 12:03   DG Lumbar Spine 2-3 Views  Result Date: 03/06/2021 CLINICAL DATA:  H and P from a fall 2 years ago. Patient reports back surgery in 2 days. EXAM: LUMBAR SPINE - 2-3 VIEW COMPARISON:  Lumbar myelogram 01/09/2021 FINDINGS: Standing AP and lateral views obtained. Normal static alignment. Vertebral body heights are normal. There are 5 lumbar type vertebra. Mild disc space narrowing and endplate spurring at I2-L7. There is multilevel facet hypertrophy. No fracture or focal lesion. Sacroiliac joints are congruent. IMPRESSION: Mild disc space narrowing and endplate spurring at L8-X2. Multilevel facet hypertrophy. Electronically Signed   By: Keith Rake M.D.   On: 03/06/2021 20:27    EKG: Orders placed or performed during the hospital encounter of 03/05/21   EKG 12  lead per protocol   EKG 12 lead per protocol     Hospital Course: Patient was admitted to [facility] and taken to the OR and underwent the above state procedure without complications.  Patient tolerated the procedure well and was later transferred to the recovery room and then to the orthopaedic floor for postoperative care.  They were given PO and IV analgesics for pain control following their surgery.  They were given 24 hours of postoperative antibiotics.   PT was consulted postop to assist with mobility and transfers.  The patient was allowed to be WBAT with therapy and was taught back precautions. Discharge planning was consulted to help with postop disposition and equipment needs.  Patient had a good night on the evening of surgery and started to get up OOB with therapy on day one. Patient was seen in rounds and was ready to go home on day one.  They were given discharge instructions and dressing directions.  They were instructed on when to follow up in the office with Dr. Tonita Cong.   Diet: Regular diet Activity:WBAT, Lspine precautions Follow-up:in 10-14 days Disposition - Home Discharged Condition: good   Discharge Instructions     Call MD / Call 911   Complete by: As directed    If you experience chest pain or shortness of breath, CALL 911 and be transported to the hospital emergency room.  If you develope a fever above 101 F, pus (white drainage) or increased drainage or redness at the wound, or calf pain, call your surgeon's office.   Constipation Prevention   Complete by: As directed    Drink plenty of fluids.  Prune juice may be helpful.  You may use a stool softener, such as Colace (over the counter) 100 mg twice a day.  Use MiraLax (over the counter) for constipation as needed.   Diet - low sodium heart healthy   Complete by: As directed    Increase activity slowly as tolerated   Complete by: As directed    Post-operative opioid taper instructions:   Complete by: As  directed    POST-OPERATIVE OPIOID TAPER INSTRUCTIONS: It is important to wean off of your opioid medication as soon as possible. If you do not need pain medication after your surgery it is ok to stop day one. Opioids include: Codeine, Hydrocodone(Norco,  Vicodin), Oxycodone(Percocet, oxycontin) and hydromorphone amongst others.  Long term and even short term use of opiods can cause: Increased pain response Dependence Constipation Depression Respiratory depression And more.  Withdrawal symptoms can include Flu like symptoms Nausea, vomiting And more Techniques to manage these symptoms Hydrate well Eat regular healthy meals Stay active Use relaxation techniques(deep breathing, meditating, yoga) Do Not substitute Alcohol to help with tapering If you have been on opioids for less than two weeks and do not have pain than it is ok to stop all together.  Plan to wean off of opioids This plan should start within one week post op of your joint replacement. Maintain the same interval or time between taking each dose and first decrease the dose.  Cut the total daily intake of opioids by one tablet each day Next start to increase the time between doses. The last dose that should be eliminated is the evening dose.         Allergies as of 03/22/2021       Reactions   Statins Other (See Comments)   Muscle pain   Codeine Nausea And Vomiting        Medication List     TAKE these medications    ALPRAZolam 0.5 MG tablet Commonly known as: XANAX Take 1 tablet (0.5 mg total) by mouth at bedtime as needed for anxiety.   Botox 100 units Solr injection Generic drug: botulinum toxin Type A INJECT 155 UNITS  INTRAMUSCULARLY EVERY 3  MONTHS (GIVEN AT MD OFFICE, DISCARD UNUSED AFTER 1ST  USE)   docusate sodium 100 MG capsule Commonly known as: Colace Take 1 capsule (100 mg total) by mouth 2 (two) times daily as needed for mild constipation.   gabapentin 300 MG capsule Commonly known  as: NEURONTIN TAKE ONE TO THREE CAPSULES (300MG -900MG ) AT BEDTIME What changed:  how much to take how to take this when to take this   ketoconazole 2 % cream Commonly known as: NIZORAL Apply 1 application topically daily as needed for irritation.   oxyCODONE 5 MG immediate release tablet Commonly known as: Oxy IR/ROXICODONE Take 1 tablet (5 mg total) by mouth every 6 (six) hours as needed for severe pain.   polyethylene glycol 17 g packet Commonly known as: MIRALAX / GLYCOLAX Take 17 g by mouth daily.   rizatriptan 10 MG disintegrating tablet Commonly known as: MAXALT-MLT Take 1 tablet (10 mg total) by mouth as needed for migraine. May repeat in 2 hours if needed   Synthroid 75 MCG tablet Generic drug: levothyroxine Take 75 mcg by mouth daily before breakfast.   venlafaxine XR 75 MG 24 hr capsule Commonly known as: EFFEXOR-XR Take 1 capsule (75 mg total) by mouth daily with breakfast.        Follow-up Information     Susa Day, MD Follow up in 2 week(s).   Specialty: Orthopedic Surgery Contact information: [address] [place] [address] [phone]                 Signed: Lacie Draft PA-C Orthopaedic Surgery 03/22/2021, 7:59 AM

## 2021-03-22 NOTE — Progress Notes (Signed)
Patient is discharged from room 3C10 at this time. Alert and in stable condition. IV site d/c'd and instructions read to patient and daughter with understanding verbalized and all questions answered. Left unit via wheelchair with all belongings at side.

## 2021-03-22 NOTE — Evaluation (Signed)
Physical Therapy Evaluation Patient Details Name: [name] Nolan MRN: [id number] DOB: 04/22/1946 Today's Date: 03/22/2021   History of Present Illness  Pt is a 75 y.o. female s/p bil microlumbar decompression L4-5 with bil hemilaminotomies and foraminotomies. PMH significant for anxiety, arthritis, depression, Hashimotos disease, heart murmur, and osteoporosis.   Clinical Impression  Pt presented supine in bed with HOB elevated, awake and willing to participate in therapy session. Prior to admission, pt reported that she was independent with all functional mobility and ADLs. Pt lives alone in a single level home with a total of six steps to enter her home. She has family that can provide support initially upon d/c. At the time of evaluation, pt overall at a mod I to min guard level with all functional mobility including hallway ambulation without use of an AD. Additionally, she participated in stair training this session without difficulties. PT reviewed 3/3 back precautions with pt throughout session in regards to functional mobility. PT will continue to follow pt acutely to progress mobility as tolerated.     Follow Up Recommendations No PT follow up    Equipment Recommendations  None recommended by PT    Recommendations for Other Services       Precautions / Restrictions Precautions Precautions: Back Precaution Booklet Issued: Yes (comment) Precaution Comments: pt able to recall 3/3 back precautions Restrictions Weight Bearing Restrictions: No      Mobility  Bed Mobility Overal bed mobility: Modified Independent             General bed mobility comments: cueing for log roll technique    Transfers Overall transfer level: Modified independent Equipment used: None                Ambulation/Gait Ambulation/Gait assistance: Min guard Gait Distance (Feet): 500 Feet Assistive device: None Gait Pattern/deviations: Step-through pattern;Decreased stride  length;Drifts right/left Gait velocity: decreased   General Gait Details: pt with mild instability but no overt LOB or need for physical assistance, close min guard for safety  Stairs Stairs: Yes Stairs assistance: Min guard Stair Management: One rail Left;Step to pattern;Forwards Number of Stairs: 10 General stair comments: pt with no instability or LOB  Wheelchair Mobility    Modified Rankin (Stroke Patients Only)       Balance Overall balance assessment: Needs assistance Sitting-balance support: Feet supported Sitting balance-Leahy Scale: Good     Standing balance support: During functional activity;No upper extremity supported Standing balance-Leahy Scale: Fair                               Pertinent Vitals/Pain Pain Assessment: Faces Faces Pain Scale: No hurt Pain Intervention(s): Monitored during session    Home Living Family/patient expects to be discharged to:: Private residence Living Arrangements: Alone Available Help at Discharge: Family Type of Home: House Home Access: Stairs to enter Entrance Stairs-Rails: Right;Left;Can reach both Entrance Stairs-Number of Steps: 6 total to enter Home Layout: One level Home Equipment: Walker - 2 wheels;Grab bars - tub/shower;Shower seat;Hand held shower head Additional Comments: Pt reporting her home is accessible as she has made updates for her husband who has dementia.    Prior Function Level of Independence: Independent         Comments: Pt was independent in ADLs, IADLs, and driving     Hand Dominance   Dominant Hand: Right    Extremity/Trunk Assessment   Upper Extremity Assessment Upper Extremity Assessment: Defer to OT evaluation  Lower Extremity Assessment Lower Extremity Assessment: Generalized weakness    Cervical / Trunk Assessment Cervical / Trunk Assessment: Other exceptions Cervical / Trunk Exceptions: s/p lumbar spinal surgery  Communication   Communication: No  difficulties  Cognition Arousal/Alertness: Awake/alert Behavior During Therapy: WFL for tasks assessed/performed Overall Cognitive Status: Within Functional Limits for tasks assessed                                        General Comments      Exercises     Assessment/Plan    PT Assessment Patient needs continued PT services  PT Problem List Decreased mobility;Decreased balance;Decreased safety awareness;Decreased knowledge of precautions       PT Treatment Interventions DME instruction;Gait training;Stair training;Functional mobility training;Therapeutic activities;Therapeutic exercise;Balance training;Neuromuscular re-education;Patient/family education    PT Goals (Current goals can be found in the Care Plan section)  Acute Rehab PT Goals Patient Stated Goal: "home today" PT Goal Formulation: With patient Time For Goal Achievement: 04/05/21 Potential to Achieve Goals: Good    Frequency Min 5X/week   Barriers to discharge        Co-evaluation               AM-PAC PT "6 Clicks" Mobility  Outcome Measure Help needed turning from your back to your side while in a flat bed without using bedrails?: None Help needed moving from lying on your back to sitting on the side of a flat bed without using bedrails?: None Help needed moving to and from a bed to a chair (including a wheelchair)?: None Help needed standing up from a chair using your arms (e.g., wheelchair or bedside chair)?: None Help needed to walk in hospital room?: None Help needed climbing 3-5 steps with a railing? : None 6 Click Score: 24    End of Session   Activity Tolerance: Patient tolerated treatment well Patient left: in bed;with call bell/phone within reach Nurse Communication: Mobility status PT Visit Diagnosis: Other abnormalities of gait and mobility (R26.89)    Time: 8850-2774 PT Time Calculation (min) (ACUTE ONLY): 34 min   Charges:   PT Evaluation $PT Eval Low  Complexity: 1 Low PT Treatments $Gait Training: 8-22 mins        Anastasio Champion, DPT  Acute Rehabilitation Services Office [place] 03/22/2021, 9:03 AM

## 2021-03-22 NOTE — Progress Notes (Signed)
Subjective: 1 Day Post-Op Procedure(s) (LRB): Microlumbar decompression Lumbar four-five Central (N/A) Patient reports pain as moderate.  Reports incisional soreness but leg is doing well. No other c/o. Feels ready to go home. Drain removed this morning already.  Objective: Vital signs in last 24 hours: Temp:  [97 F (36.1 C)-98.4 F (36.9 C)] 97.5 F (36.4 C) (07/08 0743) Pulse Rate:  [56-80] 80 (07/08 0743) Resp:  [12-20] 18 (07/08 0743) BP: (103-159)/(66-96) 103/66 (07/08 0743) SpO2:  [93 %-100 %] 97 % (07/08 0743)  Intake/Output from previous day: 07/07 0701 - 07/08 0700 In: 1100 [I.V.:1000; IV Piggyback:100] Out: 1300 [Urine:1100; Drains:100; Blood:100] Intake/Output this shift: No intake/output data recorded.  Recent Labs    03/22/21 0453  HGB 12.4   Recent Labs    03/22/21 0453  WBC 8.9  RBC 4.10  HCT 37.9  PLT 225   No results for input(s): NA, K, CL, CO2, BUN, CREATININE, GLUCOSE, CALCIUM in the last 72 hours. No results for input(s): LABPT, INR in the last 72 hours.  Neurologically intact ABD soft Neurovascular intact Sensation intact distally Intact pulses distally Dorsiflexion/Plantar flexion intact Incision: dressing C/D/I and scant drainage No cellulitis present Compartment soft No sign of DVT   Assessment/Plan: 1 Day Post-Op Procedure(s) (LRB): Microlumbar decompression Lumbar four-five Central (N/A) Advance diet Up with therapy D/C IV fluids Discussed D/C instructions, dressing instructions, Lspine precautions D/C home today  Cecilie Kicks 03/22/2021, 7:57 AM

## 2021-04-25 DIAGNOSIS — M545 Low back pain, unspecified: Secondary | ICD-10-CM | POA: Diagnosis not present

## 2021-04-30 DIAGNOSIS — M545 Low back pain, unspecified: Secondary | ICD-10-CM | POA: Diagnosis not present

## 2021-05-09 DIAGNOSIS — M545 Low back pain, unspecified: Secondary | ICD-10-CM | POA: Diagnosis not present

## 2021-05-13 DIAGNOSIS — M545 Low back pain, unspecified: Secondary | ICD-10-CM | POA: Diagnosis not present

## 2021-06-04 ENCOUNTER — Ambulatory Visit: Payer: Medicare PPO | Admitting: Neurology

## 2021-06-04 DIAGNOSIS — G43719 Chronic migraine without aura, intractable, without status migrainosus: Secondary | ICD-10-CM

## 2021-06-04 DIAGNOSIS — R202 Paresthesia of skin: Secondary | ICD-10-CM

## 2021-06-04 DIAGNOSIS — R200 Anesthesia of skin: Secondary | ICD-10-CM

## 2021-06-04 NOTE — Progress Notes (Signed)
Patient is here for Botox, but tells me she has a new symptom she thinks that she is getting neuropathy in the feet.  This started in the toes and progressing.  Her toes feel numb, worse at night.  She did have low back issues and although the radiculopathy is improved, she still has pain radiating from her right knee to her foot however the problems in the feet are different.  She reports no imbalance, more numbness and tingling but started distally and progressing.  She has no known diabetes history, she was on B12 injections at 1 point and now takes them orally.  I explained that the most common cause of neuropathy is diabetes so we will check hemoglobin A1c, will also check her vitamin B12 some people do not absorb vitamin B12, we will also check several other labs pertaining to her symptoms.  Orders Placed This Encounter  Procedures   B12 and Folate Panel   Methylmalonic acid, serum   Hemoglobin A1c   Multiple Myeloma Panel (SPEP&IFE w/QIG)   Vitamin B6   Vitamin B1   I spent 10 minutes of face-to-face and non-face-to-face time with patient on the numbness and tingling in the feet diagnosis.  This included previsit chart review, lab review, study review, order entry, electronic health record documentation, patient education on the different diagnostic and therapeutic options, counseling and coordination of care, risks and benefits of management, compliance, or risk factor reduction.  This does not include time spent on Botox procedure.

## 2021-06-04 NOTE — Progress Notes (Signed)
Botox- 200 units x 1 vial Lot: [id number] Expiration: 10/2023 NDC: 1164-3539-12  Bacteriostatic 0.9% Sodium Chloride- 67mL total Lot: [id number] Expiration: 09/15/2022 NDC: 2194-7125-27  Dx: H29.290 B/B

## 2021-06-04 NOTE — Progress Notes (Signed)
Consent Form Botulism Toxin Injection For Chronic Migraine  06/04/2021 stable: Doing extremely well at least 70% improvement continues in migraine frequency. 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 [name] Nolan is in a geriatric psych facility in [place].   Reviewed orally with patient, additionally signature is on file:  Botulism toxin has been approved by the Federal drug administration for treatment of chronic migraine. Botulism toxin does not cure chronic migraine and it may not be effective in some patients.  The administration of botulism toxin is accomplished by injecting a small amount of toxin into the muscles of the neck and head. Dosage must be titrated for each individual. Any benefits resulting from botulism toxin tend to wear off after 3 months with a repeat injection required if benefit is to be maintained. Injections are usually done every 3-4 months with maximum effect peak achieved by about 2 or 3 weeks. Botulism toxin is expensive and you should be sure of what costs you will incur resulting from the injection.  The side effects of botulism toxin use for chronic migraine may include:   -Transient, and usually mild, facial weakness with facial injections  -Transient, and usually mild, head or neck weakness with head/neck injections  -Reduction or loss of forehead facial animation due to forehead muscle weakness  -Eyelid drooping  -Dry eye  -Pain at the site of injection or bruising at the site of injection  -Double vision  -Potential unknown long term risks  Contraindications: You should not have Botox if you are pregnant, nursing, allergic to albumin, have an infection, skin condition, or muscle weakness at the site of the injection, or have myasthenia gravis, Lambert-Eaton syndrome, or ALS.  It is also possible that as with any injection, there may be an allergic reaction or no effect from the medication. Reduced effectiveness after repeated injections is sometimes seen and rarely infection at the  injection site may occur. All care will be taken to prevent these side effects. If therapy is given over a long time, atrophy and wasting in the muscle injected may occur. Occasionally the patient's become refractory to treatment because they develop antibodies to the toxin. In this event, therapy needs to be modified.  I have read the above information and consent to the administration of botulism toxin.    BOTOX PROCEDURE NOTE FOR MIGRAINE HEADACHE    Contraindications and precautions discussed with patient(above). Aseptic procedure was observed and patient tolerated procedure. Procedure performed by Dr. Georgia Dom  The condition has existed for more than 6 months, and pt does not have a diagnosis of ALS, Myasthenia Gravis or Lambert-Eaton Syndrome.  Risks and benefits of injections discussed and pt agrees to proceed with the procedure.  Written consent obtained  These injections are medically necessary. Pt  receives good benefits from these injections. These injections do not cause sedations or hallucinations which the oral therapies may cause.  Description of procedure:  The patient was placed in a sitting position. The standard protocol was used for Botox as follows, with 5 units of Botox injected at each site:   -Procerus muscle, midline injection  -Corrugator muscle, bilateral injection  -Frontalis muscle, bilateral injection, with 2 sites each side, medial injection was performed in the upper one third of the frontalis muscle, in the region vertical from the medial inferior edge of the superior orbital rim. The lateral injection was again in the upper one third of the forehead vertically above the lateral limbus of the cornea, 1.5 cm lateral to the medial injection site.  -  Temporalis muscle injection, 4 sites, bilaterally. The first injection was 3 cm above the tragus of the ear, second injection site was 1.5 cm to 3 cm up from the first injection site in line with the tragus of  the ear. The third injection site was 1.5-3 cm forward between the first 2 injection sites. The fourth injection site was 1.5 cm posterior to the second injection site.   -Occipitalis muscle injection, 3 sites, bilaterally. The first injection was done one half way between the occipital protuberance and the tip of the mastoid process behind the ear. The second injection site was done lateral and superior to the first, 1 fingerbreadth from the first injection. The third injection site was 1 fingerbreadth superiorly and medially from the first injection site.  -Cervical paraspinal muscle injection, 2 sites, bilateral knee first injection site was 1 cm from the midline of the cervical spine, 3 cm inferior to the lower border of the occipital protuberance. The second injection site was 1.5 cm superiorly and laterally to the first injection site.  -Trapezius muscle injection was performed at 3 sites, bilaterally. The first injection site was in the upper trapezius muscle halfway between the inflection point of the neck, and the acromion. The second injection site was one half way between the acromion and the first injection site. The third injection was done between the first injection site and the inflection point of the neck.   Will return for repeat injection in 3 months.   155 units of Botox was used, 45u Botox not injected was wasted. The patient tolerated the procedure well, there were no complications of the above procedure.

## 2021-06-10 LAB — MULTIPLE MYELOMA PANEL, SERUM
Albumin SerPl Elph-Mcnc: 4.2 g/dL (ref 2.9–4.4)
Albumin/Glob SerPl: 1.5 (ref 0.7–1.7)
Alpha 1: 0.3 g/dL (ref 0.0–0.4)
Alpha2 Glob SerPl Elph-Mcnc: 0.7 g/dL (ref 0.4–1.0)
B-Globulin SerPl Elph-Mcnc: 1.1 g/dL (ref 0.7–1.3)
Gamma Glob SerPl Elph-Mcnc: 1 g/dL (ref 0.4–1.8)
Globulin, Total: 3 g/dL (ref 2.2–3.9)
IgA/Immunoglobulin A, Serum: 283 mg/dL (ref 64–422)
IgG (Immunoglobin G), Serum: 908 mg/dL (ref 586–1602)
IgM (Immunoglobulin M), Srm: 167 mg/dL (ref 26–217)
Total Protein: 7.2 g/dL (ref 6.0–8.5)

## 2021-06-10 LAB — B12 AND FOLATE PANEL
Folate: 7.4 ng/mL (ref >3.0)
Vitamin B-12: 377 pg/mL (ref 232–1245)

## 2021-06-10 LAB — HEMOGLOBIN A1C
Est. average glucose Bld gHb Est-mCnc: 105 mg/dL
Hgb A1c MFr Bld: 5.3 % (ref 4.8–5.6)

## 2021-06-10 LAB — VITAMIN B1: Thiamine: 118.8 nmol/L (ref 66.5–200.0)

## 2021-06-10 LAB — VITAMIN B6: Vitamin B6: 11.1 ug/L (ref 3.4–65.2)

## 2021-06-10 LAB — METHYLMALONIC ACID, SERUM: Methylmalonic Acid: 179 nmol/L (ref 0–378)

## 2021-08-06 ENCOUNTER — Ambulatory Visit: Payer: Medicare PPO | Admitting: Family Medicine

## 2021-08-26 DIAGNOSIS — M5451 Vertebrogenic low back pain: Secondary | ICD-10-CM | POA: Diagnosis not present

## 2021-08-30 ENCOUNTER — Ambulatory Visit (HOSPITAL_BASED_OUTPATIENT_CLINIC_OR_DEPARTMENT_OTHER): Payer: Medicare PPO | Admitting: Nurse Practitioner

## 2021-08-31 ENCOUNTER — Other Ambulatory Visit: Payer: Self-pay | Admitting: Family Medicine

## 2021-09-03 ENCOUNTER — Ambulatory Visit (INDEPENDENT_AMBULATORY_CARE_PROVIDER_SITE_OTHER): Payer: Medicare PPO | Admitting: Neurology

## 2021-09-03 DIAGNOSIS — G43719 Chronic migraine without aura, intractable, without status migrainosus: Secondary | ICD-10-CM | POA: Diagnosis not present

## 2021-09-03 MED ORDER — VENLAFAXINE HCL ER 75 MG PO CP24: 75.0000 mg | ORAL_CAPSULE | Freq: Every day | ORAL | 4 refills | Status: DC

## 2021-09-03 MED ORDER — GABAPENTIN 300 MG PO CAPS: ORAL_CAPSULE | ORAL | 4 refills | Status: DC

## 2021-09-03 NOTE — Progress Notes (Signed)
Botox- 200 units x 1 vial Lot: [id number] Expiration: 05/2024 NDC: 4144-3601-65   0.9% Sodium Chloride- 36mL total Lot: [id number] Expiration: 10/2021 NDC: 49447-395-84  Dx: Y17.127 B/B

## 2021-09-03 NOTE — Progress Notes (Signed)
Consent Form Botulism Toxin Injection For Chronic Migraine  09/03/2021 stable: Doing extremely well at least 70% improvement continues in migraine frequency. 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  Reviewed orally with patient, additionally signature is on file:  Botulism toxin has been approved by the Federal drug administration for treatment of chronic migraine. Botulism toxin does not cure chronic migraine and it may not be effective in some patients.  The administration of botulism toxin is accomplished by injecting a small amount of toxin into the muscles of the neck and head. Dosage must be titrated for each individual. Any benefits resulting from botulism toxin tend to wear off after 3 months with a repeat injection required if benefit is to be maintained. Injections are usually done every 3-4 months with maximum effect peak achieved by about 2 or 3 weeks. Botulism toxin is expensive and you should be sure of what costs you will incur resulting from the injection.  The side effects of botulism toxin use for chronic migraine may include:   -Transient, and usually mild, facial weakness with facial injections  -Transient, and usually mild, head or neck weakness with head/neck injections  -Reduction or loss of forehead facial animation due to forehead muscle weakness  -Eyelid drooping  -Dry eye  -Pain at the site of injection or bruising at the site of injection  -Double vision  -Potential unknown long term risks  Contraindications: You should not have Botox if you are pregnant, nursing, allergic to albumin, have an infection, skin condition, or muscle weakness at the site of the injection, or have myasthenia gravis, Lambert-Eaton syndrome, or ALS.  It is also possible that as with any injection, there may be an allergic reaction or no effect from the medication. Reduced effectiveness after repeated injections is sometimes seen and rarely infection at the  injection site may occur. All care will be taken to prevent these side effects. If therapy is given over a long time, atrophy and wasting in the muscle injected may occur. Occasionally the patient's become refractory to treatment because they develop antibodies to the toxin. In this event, therapy needs to be modified.  I have read the above information and consent to the administration of botulism toxin.    BOTOX PROCEDURE NOTE FOR MIGRAINE HEADACHE    Contraindications and precautions discussed with patient(above). Aseptic procedure was observed and patient tolerated procedure. Procedure performed by Dr. Georgia Dom  The condition has existed for more than 6 months, and pt does not have a diagnosis of ALS, Myasthenia Gravis or Lambert-Eaton Syndrome.  Risks and benefits of injections discussed and pt agrees to proceed with the procedure.  Written consent obtained  These injections are medically necessary. Pt  receives good benefits from these injections. These injections do not cause sedations or hallucinations which the oral therapies may cause.  Description of procedure:  The patient was placed in a sitting position. The standard protocol was used for Botox as follows, with 5 units of Botox injected at each site:   -Procerus muscle, midline injection  -Corrugator muscle, bilateral injection  -Frontalis muscle, bilateral injection, with 2 sites each side, medial injection was performed in the upper one third of the frontalis muscle, in the region vertical from the medial inferior edge of the superior orbital rim. The lateral injection was again in the upper one third of the forehead vertically above the lateral limbus of the cornea, 1.5 cm lateral to the medial injection site.  -  Temporalis muscle injection, 4 sites, bilaterally. The first injection was 3 cm above the tragus of the ear, second injection site was 1.5 cm to 3 cm up from the first injection site in line with the tragus of  the ear. The third injection site was 1.5-3 cm forward between the first 2 injection sites. The fourth injection site was 1.5 cm posterior to the second injection site.   -Occipitalis muscle injection, 3 sites, bilaterally. The first injection was done one half way between the occipital protuberance and the tip of the mastoid process behind the ear. The second injection site was done lateral and superior to the first, 1 fingerbreadth from the first injection. The third injection site was 1 fingerbreadth superiorly and medially from the first injection site.  -Cervical paraspinal muscle injection, 2 sites, bilateral knee first injection site was 1 cm from the midline of the cervical spine, 3 cm inferior to the lower border of the occipital protuberance. The second injection site was 1.5 cm superiorly and laterally to the first injection site.  -Trapezius muscle injection was performed at 3 sites, bilaterally. The first injection site was in the upper trapezius muscle halfway between the inflection point of the neck, and the acromion. The second injection site was one half way between the acromion and the first injection site. The third injection was done between the first injection site and the inflection point of the neck.   Will return for repeat injection in 3 months.   155 units of Botox was used, 45u Botox not injected was wasted. The patient tolerated the procedure well, there were no complications of the above procedure.

## 2021-09-04 DIAGNOSIS — F419 Anxiety disorder, unspecified: Secondary | ICD-10-CM | POA: Diagnosis not present

## 2021-09-04 DIAGNOSIS — K219 Gastro-esophageal reflux disease without esophagitis: Secondary | ICD-10-CM | POA: Diagnosis not present

## 2021-09-04 DIAGNOSIS — K5900 Constipation, unspecified: Secondary | ICD-10-CM | POA: Diagnosis not present

## 2021-09-04 DIAGNOSIS — E669 Obesity, unspecified: Secondary | ICD-10-CM | POA: Diagnosis not present

## 2021-09-04 DIAGNOSIS — Z7722 Contact with and (suspected) exposure to environmental tobacco smoke (acute) (chronic): Secondary | ICD-10-CM | POA: Diagnosis not present

## 2021-09-04 DIAGNOSIS — F325 Major depressive disorder, single episode, in full remission: Secondary | ICD-10-CM | POA: Diagnosis not present

## 2021-09-04 DIAGNOSIS — G629 Polyneuropathy, unspecified: Secondary | ICD-10-CM | POA: Diagnosis not present

## 2021-09-04 DIAGNOSIS — I739 Peripheral vascular disease, unspecified: Secondary | ICD-10-CM | POA: Diagnosis not present

## 2021-09-04 DIAGNOSIS — E039 Hypothyroidism, unspecified: Secondary | ICD-10-CM | POA: Diagnosis not present

## 2021-09-24 ENCOUNTER — Other Ambulatory Visit: Payer: Self-pay

## 2021-09-24 ENCOUNTER — Ambulatory Visit (HOSPITAL_BASED_OUTPATIENT_CLINIC_OR_DEPARTMENT_OTHER): Payer: Medicare PPO | Admitting: Nurse Practitioner

## 2021-09-24 ENCOUNTER — Encounter (HOSPITAL_BASED_OUTPATIENT_CLINIC_OR_DEPARTMENT_OTHER): Payer: Self-pay | Admitting: Nurse Practitioner

## 2021-09-24 VITALS — BP 134/87 | HR 71 | Ht 65.0 in | Wt 191.6 lb

## 2021-09-24 DIAGNOSIS — E8881 Metabolic syndrome: Secondary | ICD-10-CM

## 2021-09-24 DIAGNOSIS — Z789 Other specified health status: Secondary | ICD-10-CM

## 2021-09-24 DIAGNOSIS — J0140 Acute pansinusitis, unspecified: Secondary | ICD-10-CM

## 2021-09-24 DIAGNOSIS — E782 Mixed hyperlipidemia: Secondary | ICD-10-CM

## 2021-09-24 DIAGNOSIS — Z6832 Body mass index (BMI) 32.0-32.9, adult: Secondary | ICD-10-CM

## 2021-09-24 MED ORDER — AMOXICILLIN-POT CLAVULANATE 875-125 MG PO TABS: 1.0000 | ORAL_TABLET | Freq: Two times a day (BID) | ORAL | 0 refills | Status: DC

## 2021-09-24 NOTE — Patient Instructions (Addendum)
I have sent in an antibiotic for you to help with the sinus infection. If you are not feeling any better in the next 5 days, please let me know.   I am going to work on the medication for the weight loss. We will let you know if we get this approved.  The name of this is semaglutide.

## 2021-09-24 NOTE — Progress Notes (Signed)
Established Patient Office Visit  Subjective:  Patient ID: [name] Nolan, female    DOB: [birth year]  Age: 75 y.o. MRN: [id number]  CC:  Chief Complaint  Patient presents with   Follow-up    HPI [name] Nolan presents for evaluation of routine health and she would like to discuss weight and ear pain.  She tells me she has been walking and working to loose weight but is having difficulty seeing any changes in the scale. She is interested in medical options that may help with this journey and has heard of Ozempic from her nuerologist and is interested to know if she would be eligible for this medication to help with her weight. She manages her diet well.   She endorses a URI that started a few weeks ago, but has resulted in ongoing ear pain and pressure. She denies fever, chills, body aches at this time. She did have a cough, but this has finally improved per the patient. She has not had success with OTC treatment options.   Past Medical History:  Diagnosis Date   Absolute anemia 11/17/2017   Acute bronchitis 08/15/2015   Acute pain of left knee 07/31/2018   Anxiety    Anxiety state 05/07/2014   Widowed in 2013 after caring for her husband with Lewy Body Dementia for 6 years    Arrhythmia 01/25/2015   Per Dr. Jaynee Eagles, [facility]; hx PVC   Arthritis    "knees" (11/07/2015)   Arthritis of knee, degenerative 03/02/2017   Basal cell carcinoma of right ear 02/26/2015   Removed by Dr Syble Creek   Chronic back pain    "mid-back; stops at the very lowest part of my back" (11/07/2015)   Chronic migraine w/o aura w/o status migrainosus, not intractable 01/17/2016   Colon cancer screening 12/13/2014   Colon polyp 09/03/2017   Constipation 02/25/2016   Constipation 02/25/2016   Depression    Dysphagia    Dysphagia, pharyngoesophageal phase 12/13/2014   Dyspnea on exertion 12/13/2014   Esophageal reflux    occ   Excessive daytime sleepiness 01/25/2015   Fainting     fainted twice   Family history of adverse reaction to anesthesia    "daughter gets bad PONV"   Gallbladder problem    Hashimoto's disease    Heart murmur    Hot flashes 05/27/2016   Hot flashes 05/27/2016   Hyperlipidemia    Hypothyroid    Insomnia    Joint pain    Low ferritin 08/27/2015   "took supplements for awhile" (11/07/2015)   Low ferritin 08/27/2015   Medicare annual wellness visit, subsequent 08/27/2015   Menopause    Migraine    "under control w/daily RX right now" (11/07/2015)   Morning headache 01/25/2015   Occipital neuralgia    Osteopenia 02/26/2015   Osteoporosis    osteopenia   Other fatigue 11/02/2017   Pneumonia    PONV (postoperative nausea and vomiting) 1974   after cholecystectomy   Preventative health care 08/27/2015   Prolonged depressive reaction    Shortness of breath on exertion 11/02/2017   Skin cancer 02/26/2015   Right ear Removed by Dr Syble Creek   Swallowing difficulty    Vitamin B12 deficiency 09/01/2016   Vitamin D deficiency 03/02/2017    Past Surgical History:  Procedure Laterality Date   APPENDECTOMY  11/07/2015   BASAL CELL CARCINOMA EXCISION Right 02/26/2015   ear   CATARACT EXTRACTION Bilateral    CHOLECYSTECTOMY OPEN  09/15/1972   COLONOSCOPY  09/15/2004  LAPAROSCOPIC APPENDECTOMY N/A 11/07/2015   Procedure: APPENDECTOMY LAPAROSCOPIC;  Surgeon: Georganna Skeans, MD;  Location: [place];  Service: General;  Laterality: N/A;   LAPAROSCOPIC INCISIONAL / UMBILICAL / [place]  11/07/2015   UHR   LUMBAR LAMINECTOMY/DECOMPRESSION MICRODISCECTOMY N/A 03/21/2021   Procedure: Microlumbar decompression Lumbar four-five Central;  Surgeon: Susa Day, MD;  Location: [place];  Service: Orthopedics;  Laterality: N/A;   Tooth implant     at least 5 years ago per pt   TUBAL LIGATION  83/15/1761   UMBILICAL HERNIA REPAIR N/A 11/07/2015   Procedure: LAPAROSCOPIC UMBILICAL HERNIA;  Surgeon: Georganna Skeans, MD;  Location: [place];   Service: General;  Laterality: N/A;    Family History  Problem Relation Age of Onset   Congestive Heart Failure Mother    Hypertension Mother    Hyperlipidemia Mother    Heart disease Mother    Obesity Mother    Leukemia Father    Obesity Father    Kidney disease Brother    Cancer Brother        stage 4 kidney cancer, metastatic   Kidney cancer Brother    Leukemia Maternal Aunt    Congestive Heart Failure Maternal Grandmother    Arthritis Sister    Colon cancer Neg Hx     Social History   Socioeconomic History   Marital status: Married    Spouse name: [name] Nolan   Number of children: 1   Years of education: HS   Highest education level: Not on file  Occupational History   Occupation: Retired  Tobacco Use   Smoking status: Never   Smokeless tobacco: Never  Vaping Use   Vaping Use: Never used  Substance and Sexual Activity   Alcohol use: No    Alcohol/week: 0.0 standard drinks   Drug use: No   Sexual activity: Yes    Birth control/protection: Post-menopausal    Comment: lives with husband, no dietary restrictions, avoids caffeine, bananas, dairy  Other Topics Concern   Not on file  Social History Narrative   Patient is married [name] Moll) and lives at home with her husband.   Patient has one child.   Patient has a high school education.   Patient is right-handed.   Caffeine Use: Occasionally   Social Determinants of Health   Financial Resource Strain: Not on file  Food Insecurity: Not on file  Transportation Needs: Not on file  Physical Activity: Not on file  Stress: Not on file  Social Connections: Not on file  Intimate Partner Violence: Not on file    Outpatient Medications Prior to Visit  Medication Sig Dispense Refill   ALPRAZolam (XANAX) 0.5 MG tablet Take 1 tablet (0.5 mg total) by mouth at bedtime as needed for anxiety. 30 tablet 2   botulinum toxin Type A (BOTOX) 100 units SOLR injection INJECT 155 UNITS  INTRAMUSCULARLY EVERY 3  MONTHS  (GIVEN AT MD OFFICE, DISCARD UNUSED AFTER 1ST  USE) 2 each 3   gabapentin (NEURONTIN) 300 MG capsule TAKE ONE TO THREE CAPSULES (300MG -900MG ) AT BEDTIME 270 capsule 4   rizatriptan (MAXALT-MLT) 10 MG disintegrating tablet Take 1 tablet (10 mg total) by mouth as needed for migraine. May repeat in 2 hours if needed 15 tablet 11   SYNTHROID 75 MCG tablet Take 75 mcg by mouth daily before breakfast.     venlafaxine XR (EFFEXOR-XR) 75 MG 24 hr capsule Take 1 capsule (75 mg total) by mouth daily with breakfast. 90 capsule 4  colesevelam (WELCHOL) 625 MG tablet colesevelam 625 mg tablet  TAKE 3 TABLETS (1,875 MG TOTAL) BY MOUTH 2 (TWO) TIMES DAILY WITH A MEAL.     ergocalciferol (VITAMIN D2) 1.25 MG (50000 UT) capsule ergocalciferol (vitamin D2) 1,250 mcg (50,000 unit) capsule  TAKE 1 CAPSULE (50,000 UNITS TOTAL) BY MOUTH EVERY 7 (SEVEN) DAYS     predniSONE (DELTASONE) 5 MG tablet prednisone 5 mg tablets in a dose pack  Take 1 dose pk by oral route as directed for 6 days.     Facility-Administered Medications Prior to Visit  Medication Dose Route Frequency Provider Last Rate Last Admin   cyanocobalamin ((VITAMIN B-12)) injection 1,000 mcg  1,000 mcg Intramuscular Q30 days Mosie Lukes, MD   1,000 mcg at 09/26/20 1609    Allergies  Allergen Reactions   Statins Other (See Comments)    Muscle pain   Codeine Nausea And Vomiting    ROS Review of Systems All review of systems negative except what is listed in the HPI    Objective:    Physical Exam Vitals and nursing note reviewed.  Constitutional:      Appearance: Normal appearance.  HENT:     Head: Normocephalic.     Right Ear: Tenderness present. A middle ear effusion is present. Tympanic membrane is bulging.     Left Ear: Tenderness present. A middle ear effusion is present. Tympanic membrane is bulging.     Nose: Congestion present.     Mouth/Throat:     Mouth: Mucous membranes are moist.     Pharynx: Oropharynx is clear.  Posterior oropharyngeal erythema present.  Eyes:     Extraocular Movements: Extraocular movements intact.     Conjunctiva/sclera: Conjunctivae normal.     Pupils: Pupils are equal, round, and reactive to light.  Neck:     Vascular: No carotid bruit.  Cardiovascular:     Rate and Rhythm: Normal rate and regular rhythm.     Pulses: Normal pulses.  Pulmonary:     Effort: Pulmonary effort is normal.     Breath sounds: No wheezing or rhonchi.  Abdominal:     General: Abdomen is flat. Bowel sounds are normal.     Palpations: Abdomen is soft.  Musculoskeletal:     Cervical back: No tenderness.     Right lower leg: No edema.     Left lower leg: No edema.  Lymphadenopathy:     Cervical: Cervical adenopathy present.  Skin:    General: Skin is warm and dry.     Capillary Refill: Capillary refill takes less than 2 seconds.  Neurological:     General: No focal deficit present.     Mental Status: She is alert and oriented to person, place, and time.  Psychiatric:        Mood and Affect: Mood normal.        Behavior: Behavior normal.        Thought Content: Thought content normal.        Judgment: Judgment normal.    BP 134/87    Pulse 71    Wt 191 lb 9.6 oz (86.9 kg)    SpO2 96%    BMI 31.88 kg/m  Wt Readings from Last 3 Encounters:  09/24/21 191 lb 9.6 oz (86.9 kg)  03/21/21 180 lb (81.6 kg)  03/05/21 188 lb 14.4 oz (85.7 kg)     Health Maintenance Due  Topic Date Due   COVID-19 Vaccine (1) Never done   COLON CANCER SCREENING  ANNUAL FOBT  09/17/2017   COLONOSCOPY (Pts 45-36yrs Insurance coverage will need to be confirmed)  10/28/2018    There are no preventive care reminders to display for this patient.  Lab Results  Component Value Date   TSH 1.13 01/31/2021   Lab Results  Component Value Date   WBC 8.9 03/22/2021   HGB 12.4 03/22/2021   HCT 37.9 03/22/2021   MCV 92.4 03/22/2021   PLT 225 03/22/2021   Lab Results  Component Value Date   NA 138 03/05/2021   K  3.9 03/05/2021   CO2 23 03/05/2021   GLUCOSE 91 03/05/2021   BUN 11 03/05/2021   CREATININE 0.95 03/05/2021   BILITOT 0.6 01/31/2021   ALKPHOS 91 01/31/2021   AST 25 01/31/2021   ALT 16 01/31/2021   PROT 7.2 06/04/2021   ALBUMIN 4.5 01/31/2021   CALCIUM 9.8 03/05/2021   ANIONGAP 8 03/05/2021   GFR 52.76 (L) 01/31/2021   Lab Results  Component Value Date   CHOL 258 (H) 01/31/2021   Lab Results  Component Value Date   HDL 58.40 01/31/2021   Lab Results  Component Value Date   LDLCALC 169 (H) 01/31/2021   Lab Results  Component Value Date   TRIG 151.0 (H) 01/31/2021   Lab Results  Component Value Date   CHOLHDL 4 01/31/2021   Lab Results  Component Value Date   HGBA1C 5.3 06/04/2021      Assessment & Plan:   Problem List Items Addressed This Visit     Acute non-recurrent pansinusitis - Primary    Symptoms consistent with pansinusitis with otitis media present.  Given the length of time symptoms have been present will begin treatment with augmentin and monitor.  No alarm symptoms present.  Recommend patient continue to monitor for new or worsening symptoms and report these for further evaluation.       Relevant Medications   amoxicillin-clavulanate (AUGMENTIN) 875-125 MG tablet   predniSONE (DELTASONE) 5 MG tablet   Adult BMI 32.0-32.9 kg/sq m    BMI 31.88 today with co morbid conditions of hyperlipidemia, insulin resistance, and hypothyroidism.  Given her conditions, I do feel that Ozempic would be a good option for her to try to help initiate improved weight management and control of her other HR CV conditions. She has not been able to control her conditions with diet and exercise alone.  Will send for started Ozempic. Education provided today.  We will plan to return in 3 months after start for further evaluation.       Relevant Medications   Semaglutide,0.25 or 0.5MG /DOS, (OZEMPIC, 0.25 OR 0.5 MG/DOSE,) 2 MG/1.5ML SOPN   Semaglutide,0.25 or 0.5MG /DOS, 2  MG/1.5ML SOPN   Semaglutide, 1 MG/DOSE, 4 MG/3ML SOPN   Mixed hyperlipidemia   Relevant Medications   colesevelam (WELCHOL) 625 MG tablet   Semaglutide,0.25 or 0.5MG /DOS, (OZEMPIC, 0.25 OR 0.5 MG/DOSE,) 2 MG/1.5ML SOPN   Semaglutide,0.25 or 0.5MG /DOS, 2 MG/1.5ML SOPN   Semaglutide, 1 MG/DOSE, 4 MG/3ML SOPN   Insulin resistance   Relevant Medications   Semaglutide,0.25 or 0.5MG /DOS, (OZEMPIC, 0.25 OR 0.5 MG/DOSE,) 2 MG/1.5ML SOPN   Semaglutide,0.25 or 0.5MG /DOS, 2 MG/1.5ML SOPN   Semaglutide, 1 MG/DOSE, 4 MG/3ML SOPN   Other Visit Diagnoses     Statin intolerance       Relevant Medications   Semaglutide,0.25 or 0.5MG /DOS, (OZEMPIC, 0.25 OR 0.5 MG/DOSE,) 2 MG/1.5ML SOPN   Semaglutide,0.25 or 0.5MG /DOS, 2 MG/1.5ML SOPN   Semaglutide, 1 MG/DOSE, 4 MG/3ML SOPN  Meds ordered this encounter  Medications   amoxicillin-clavulanate (AUGMENTIN) 875-125 MG tablet    Sig: Take 1 tablet by mouth 2 (two) times daily.    Dispense:  10 tablet    Refill:  0   Semaglutide,0.25 or 0.5MG /DOS, (OZEMPIC, 0.25 OR 0.5 MG/DOSE,) 2 MG/1.5ML SOPN    Sig: Inject 0.25 mg into the skin once a week for 28 days, THEN 0.5 mg once a week for 14 days. Initial dosing.Marland Kitchen    Dispense:  1.5 mL    Refill:  0   Semaglutide,0.25 or 0.5MG /DOS, 2 MG/1.5ML SOPN    Sig: Inject 0.5 mg into the skin once a week. Second Pen.    Dispense:  1.5 mL    Refill:  0   Semaglutide, 1 MG/DOSE, 4 MG/3ML SOPN    Sig: Inject 1 mg as directed once a week. Third dose.    Dispense:  3 mL    Refill:  5    Follow-up: Return in about 3 months (around 12/23/2021) for weight.    Orma Render, NP

## 2021-09-25 ENCOUNTER — Telehealth: Payer: Self-pay | Admitting: Neurology

## 2021-09-25 NOTE — Telephone Encounter (Signed)
Patient has a Botox appointment 3/21. [employer] PA expired 09/14/21. Completed PA form & will give to MD to sign.

## 2021-09-26 ENCOUNTER — Encounter (HOSPITAL_BASED_OUTPATIENT_CLINIC_OR_DEPARTMENT_OTHER): Payer: Self-pay | Admitting: Nurse Practitioner

## 2021-09-26 DIAGNOSIS — J0140 Acute pansinusitis, unspecified: Secondary | ICD-10-CM

## 2021-09-26 DIAGNOSIS — Z6832 Body mass index (BMI) 32.0-32.9, adult: Secondary | ICD-10-CM | POA: Insufficient documentation

## 2021-09-26 HISTORY — DX: Acute pansinusitis, unspecified: J01.40

## 2021-09-26 MED ORDER — SEMAGLUTIDE (1 MG/DOSE) 4 MG/3ML ~~LOC~~ SOPN: 1.0000 mg | PEN_INJECTOR | SUBCUTANEOUS | 5 refills | Status: DC

## 2021-09-26 MED ORDER — SEMAGLUTIDE(0.25 OR 0.5MG/DOS) 2 MG/1.5ML ~~LOC~~ SOPN: 0.5000 mg | PEN_INJECTOR | SUBCUTANEOUS | 0 refills | Status: DC

## 2021-09-26 MED ORDER — OZEMPIC (0.25 OR 0.5 MG/DOSE) 2 MG/1.5ML ~~LOC~~ SOPN: PEN_INJECTOR | SUBCUTANEOUS | 0 refills | Status: DC

## 2021-09-26 NOTE — Assessment & Plan Note (Signed)
Symptoms consistent with pansinusitis with otitis media present.  Given the length of time symptoms have been present will begin treatment with augmentin and monitor.  No alarm symptoms present.  Recommend patient continue to monitor for new or worsening symptoms and report these for further evaluation.

## 2021-09-26 NOTE — Assessment & Plan Note (Signed)
BMI 31.88 today with co morbid conditions of hyperlipidemia, insulin resistance, and hypothyroidism.  Given her conditions, I do feel that Ozempic would be a good option for her to try to help initiate improved weight management and control of her other HR CV conditions. She has not been able to control her conditions with diet and exercise alone.  Will send for started Ozempic. Education provided today.  We will plan to return in 3 months after start for further evaluation.

## 2021-09-27 MED ORDER — DOXYCYCLINE HYCLATE 100 MG PO TABS: 100.0000 mg | ORAL_TABLET | Freq: Two times a day (BID) | ORAL | 0 refills | Status: DC

## 2021-09-30 NOTE — Telephone Encounter (Signed)
Received an approval from [facility] for Botox. [place] #[id number] (10/05/19- 09/14/22).

## 2021-10-13 ENCOUNTER — Encounter (HOSPITAL_BASED_OUTPATIENT_CLINIC_OR_DEPARTMENT_OTHER): Payer: Self-pay | Admitting: Nurse Practitioner

## 2021-10-14 MED ORDER — OZEMPIC (0.25 OR 0.5 MG/DOSE) 2 MG/1.5ML ~~LOC~~ SOPN: PEN_INJECTOR | SUBCUTANEOUS | 0 refills | Status: DC

## 2021-10-14 NOTE — Addendum Note (Signed)
Addended by: Campbell Riches on: 10/14/2021 03:41 PM   Modules accepted: Orders

## 2021-11-03 ENCOUNTER — Observation Stay (HOSPITAL_BASED_OUTPATIENT_CLINIC_OR_DEPARTMENT_OTHER)
Admission: EM | Admit: 2021-11-03 | Discharge: 2021-11-06 | Disposition: A | Discharge status: Home / Self Care | Payer: Medicare PPO | Attending: Internal Medicine | Admitting: Internal Medicine

## 2021-11-03 ENCOUNTER — Encounter (HOSPITAL_BASED_OUTPATIENT_CLINIC_OR_DEPARTMENT_OTHER): Payer: Self-pay | Admitting: Urology

## 2021-11-03 ENCOUNTER — Emergency Department (HOSPITAL_BASED_OUTPATIENT_CLINIC_OR_DEPARTMENT_OTHER): Payer: Medicare PPO

## 2021-11-03 ENCOUNTER — Other Ambulatory Visit: Payer: Self-pay

## 2021-11-03 DIAGNOSIS — E039 Hypothyroidism, unspecified: Secondary | ICD-10-CM | POA: Insufficient documentation

## 2021-11-03 DIAGNOSIS — Z85828 Personal history of other malignant neoplasm of skin: Secondary | ICD-10-CM | POA: Insufficient documentation

## 2021-11-03 DIAGNOSIS — R0602 Shortness of breath: Secondary | ICD-10-CM | POA: Diagnosis not present

## 2021-11-03 DIAGNOSIS — J208 Acute bronchitis due to other specified organisms: Principal | ICD-10-CM | POA: Insufficient documentation

## 2021-11-03 DIAGNOSIS — J209 Acute bronchitis, unspecified: Secondary | ICD-10-CM

## 2021-11-03 DIAGNOSIS — Z79899 Other long term (current) drug therapy: Secondary | ICD-10-CM | POA: Insufficient documentation

## 2021-11-03 DIAGNOSIS — E063 Autoimmune thyroiditis: Secondary | ICD-10-CM | POA: Diagnosis present

## 2021-11-03 DIAGNOSIS — R9431 Abnormal electrocardiogram [ECG] [EKG]: Secondary | ICD-10-CM | POA: Diagnosis not present

## 2021-11-03 DIAGNOSIS — R1110 Vomiting, unspecified: Secondary | ICD-10-CM | POA: Diagnosis not present

## 2021-11-03 DIAGNOSIS — E6609 Other obesity due to excess calories: Secondary | ICD-10-CM | POA: Diagnosis present

## 2021-11-03 DIAGNOSIS — Z6832 Body mass index (BMI) 32.0-32.9, adult: Secondary | ICD-10-CM | POA: Diagnosis present

## 2021-11-03 DIAGNOSIS — A419 Sepsis, unspecified organism: Secondary | ICD-10-CM | POA: Diagnosis not present

## 2021-11-03 DIAGNOSIS — K529 Noninfective gastroenteritis and colitis, unspecified: Secondary | ICD-10-CM | POA: Diagnosis not present

## 2021-11-03 DIAGNOSIS — Z794 Long term (current) use of insulin: Secondary | ICD-10-CM | POA: Diagnosis not present

## 2021-11-03 DIAGNOSIS — R739 Hyperglycemia, unspecified: Secondary | ICD-10-CM | POA: Diagnosis present

## 2021-11-03 DIAGNOSIS — Z6834 Body mass index (BMI) 34.0-34.9, adult: Secondary | ICD-10-CM | POA: Diagnosis present

## 2021-11-03 DIAGNOSIS — Z20822 Contact with and (suspected) exposure to covid-19: Secondary | ICD-10-CM | POA: Diagnosis not present

## 2021-11-03 DIAGNOSIS — R059 Cough, unspecified: Secondary | ICD-10-CM | POA: Diagnosis not present

## 2021-11-03 DIAGNOSIS — B348 Other viral infections of unspecified site: Secondary | ICD-10-CM | POA: Diagnosis present

## 2021-11-03 DIAGNOSIS — E876 Hypokalemia: Secondary | ICD-10-CM | POA: Diagnosis not present

## 2021-11-03 DIAGNOSIS — J189 Pneumonia, unspecified organism: Secondary | ICD-10-CM | POA: Diagnosis not present

## 2021-11-03 DIAGNOSIS — R197 Diarrhea, unspecified: Secondary | ICD-10-CM | POA: Diagnosis not present

## 2021-11-03 DIAGNOSIS — E782 Mixed hyperlipidemia: Secondary | ICD-10-CM | POA: Diagnosis present

## 2021-11-03 DIAGNOSIS — Z66 Do not resuscitate: Secondary | ICD-10-CM | POA: Diagnosis present

## 2021-11-03 DIAGNOSIS — E669 Obesity, unspecified: Secondary | ICD-10-CM | POA: Diagnosis present

## 2021-11-03 DIAGNOSIS — J40 Bronchitis, not specified as acute or chronic: Secondary | ICD-10-CM

## 2021-11-03 DIAGNOSIS — I4581 Long QT syndrome: Secondary | ICD-10-CM | POA: Diagnosis not present

## 2021-11-03 HISTORY — DX: Acute bronchitis, unspecified: J20.9

## 2021-11-03 LAB — COMPREHENSIVE METABOLIC PANEL
ALT: 45 U/L — ABNORMAL HIGH (ref 0–44)
AST: 67 U/L — ABNORMAL HIGH (ref 15–41)
Albumin: 4 g/dL (ref 3.5–5.0)
Alkaline Phosphatase: 144 U/L — ABNORMAL HIGH (ref 38–126)
Anion gap: 12 (ref 5–15)
BUN: 8 mg/dL (ref 8–23)
CO2: 22 mmol/L (ref 22–32)
Calcium: 9.2 mg/dL (ref 8.9–10.3)
Chloride: 100 mmol/L (ref 98–111)
Creatinine, Ser: 1.09 mg/dL — ABNORMAL HIGH (ref 0.44–1.00)
GFR, Estimated: 53 mL/min — ABNORMAL LOW (ref >60)
Glucose, Bld: 178 mg/dL — ABNORMAL HIGH (ref 70–99)
Potassium: 2.3 mmol/L — CL (ref 3.5–5.1)
Sodium: 134 mmol/L — ABNORMAL LOW (ref 135–145)
Total Bilirubin: 0.8 mg/dL (ref 0.3–1.2)
Total Protein: 7.5 g/dL (ref 6.5–8.1)

## 2021-11-03 LAB — CBC WITH DIFFERENTIAL/PLATELET
Abs Immature Granulocytes: 0.02 10*3/uL (ref 0.00–0.07)
Basophils Absolute: 0 10*3/uL (ref 0.0–0.1)
Basophils Relative: 0 %
Eosinophils Absolute: 0.1 10*3/uL (ref 0.0–0.5)
Eosinophils Relative: 2 %
HCT: 43.2 % (ref 36.0–46.0)
Hemoglobin: 14.4 g/dL (ref 12.0–15.0)
Immature Granulocytes: 0 %
Lymphocytes Relative: 20 %
Lymphs Abs: 1.1 10*3/uL (ref 0.7–4.0)
MCH: 29.9 pg (ref 26.0–34.0)
MCHC: 33.3 g/dL (ref 30.0–36.0)
MCV: 89.6 fL (ref 80.0–100.0)
Monocytes Absolute: 0.3 10*3/uL (ref 0.1–1.0)
Monocytes Relative: 6 %
Neutro Abs: 4.1 10*3/uL (ref 1.7–7.7)
Neutrophils Relative %: 72 %
Platelets: 215 10*3/uL (ref 150–400)
RBC: 4.82 MIL/uL (ref 3.87–5.11)
RDW: 13.7 % (ref 11.5–15.5)
WBC: 5.7 10*3/uL (ref 4.0–10.5)
nRBC: 0 % (ref 0.0–0.2)

## 2021-11-03 LAB — RESP PANEL BY RT-PCR (FLU A&B, COVID) ARPGX2
Influenza A by PCR: NEGATIVE
Influenza B by PCR: NEGATIVE
SARS Coronavirus 2 by RT PCR: NEGATIVE

## 2021-11-03 LAB — MAGNESIUM: Magnesium: 1.9 mg/dL (ref 1.7–2.4)

## 2021-11-03 MED ORDER — BENZONATATE 100 MG PO CAPS
100.0000 mg | ORAL_CAPSULE | Freq: Once | ORAL | Status: AC
  Administered 2021-11-03: 100 mg via ORAL
  Filled 2021-11-03: qty 1

## 2021-11-03 MED ORDER — POTASSIUM CHLORIDE 10 MEQ/100ML IV SOLN
10.0000 meq | INTRAVENOUS | Status: AC
  Administered 2021-11-03 (×3): 10 meq via INTRAVENOUS
  Filled 2021-11-03 (×3): qty 100

## 2021-11-03 MED ORDER — ALBUTEROL SULFATE (2.5 MG/3ML) 0.083% IN NEBU
5.0000 mg | INHALATION_SOLUTION | Freq: Once | RESPIRATORY_TRACT | Status: AC
  Administered 2021-11-03: 5 mg via RESPIRATORY_TRACT
  Filled 2021-11-03: qty 6

## 2021-11-03 MED ORDER — IPRATROPIUM BROMIDE 0.02 % IN SOLN
0.5000 mg | Freq: Once | RESPIRATORY_TRACT | Status: AC
  Administered 2021-11-03: 0.5 mg via RESPIRATORY_TRACT
  Filled 2021-11-03: qty 2.5

## 2021-11-03 MED ORDER — METHYLPREDNISOLONE SODIUM SUCC 125 MG IJ SOLR
125.0000 mg | Freq: Once | INTRAMUSCULAR | Status: AC
  Administered 2021-11-03: 125 mg via INTRAVENOUS
  Filled 2021-11-03: qty 2

## 2021-11-03 MED ORDER — POTASSIUM CHLORIDE CRYS ER 20 MEQ PO TBCR
40.0000 meq | EXTENDED_RELEASE_TABLET | Freq: Once | ORAL | Status: AC
  Administered 2021-11-03: 40 meq via ORAL
  Filled 2021-11-03: qty 2

## 2021-11-03 MED ORDER — SODIUM CHLORIDE 0.9 % IV SOLN: INTRAVENOUS | Status: DC | PRN

## 2021-11-03 MED ORDER — IOHEXOL 300 MG/ML  SOLN
100.0000 mL | Freq: Once | INTRAMUSCULAR | Status: AC | PRN
  Administered 2021-11-03: 100 mL via INTRAVENOUS

## 2021-11-03 MED ORDER — SODIUM CHLORIDE 0.9 % IV BOLUS
1000.0000 mL | Freq: Once | INTRAVENOUS | Status: AC
  Administered 2021-11-03: 1000 mL via INTRAVENOUS

## 2021-11-03 MED ORDER — MAGNESIUM SULFATE 2 GM/50ML IV SOLN
2.0000 g | Freq: Once | INTRAVENOUS | Status: AC
  Administered 2021-11-03: 2 g via INTRAVENOUS
  Filled 2021-11-03: qty 50

## 2021-11-03 MED ORDER — ONDANSETRON HCL 4 MG/2ML IJ SOLN
4.0000 mg | Freq: Once | INTRAMUSCULAR | Status: AC
  Administered 2021-11-03: 4 mg via INTRAVENOUS
  Filled 2021-11-03: qty 2

## 2021-11-03 NOTE — ED Provider Notes (Signed)
[name] Provider Note   CSN: [id number] Arrival date & time: 11/03/21  1706     History  Chief Complaint  Patient presents with   Pneumonia    [name] is a 76 y.o. female history of migraines here presenting with nausea vomiting and diarrhea and trouble breathing.  Patient states that the last week or so, she has been having poor appetite.  She states that she has been having multiple episodes of nausea and vomiting.  She also has several episodes diarrhea today.  Patient also has been having coughing as well.  Patient went to urgent care was noted to be short of breath and has diffuse wheezing.  She was given 2 neb treatments and still did not appear well so was sent here for further evaluation.  Patient states that she has no COVID exposures.  Patient denies any fevers at home.  The history is provided by the patient.      Home Medications Prior to Admission medications   Medication Sig Start Date End Date Taking? Authorizing Provider  ALPRAZolam Duanne Moron) 0.5 MG tablet Take 1 tablet (0.5 mg total) by mouth at bedtime as needed for anxiety. 01/31/21   Mosie Lukes, MD  botulinum toxin Type A (BOTOX) 100 units SOLR injection INJECT 155 UNITS  INTRAMUSCULARLY EVERY 3  MONTHS (GIVEN AT MD OFFICE, DISCARD UNUSED AFTER 1ST  USE) 09/06/19   Melvenia Beam, MD  colesevelam (WELCHOL) 625 MG tablet colesevelam 625 mg tablet  TAKE 3 TABLETS (1,875 MG TOTAL) BY MOUTH 2 (TWO) TIMES DAILY WITH A MEAL.    [provider]  doxycycline (VIBRA-TABS) 100 MG tablet Take 1 tablet (100 mg total) by mouth 2 (two) times daily. 09/27/21   Orma Render, NP  ergocalciferol (VITAMIN D2) 1.25 MG (50000 UT) capsule ergocalciferol (vitamin D2) 1,250 mcg (50,000 unit) capsule  TAKE 1 CAPSULE (50,000 UNITS TOTAL) BY MOUTH EVERY 7 (SEVEN) DAYS    [provider]  gabapentin (NEURONTIN) 300 MG capsule TAKE ONE TO THREE CAPSULES (300MG -900MG ) AT  BEDTIME 09/03/21   Melvenia Beam, MD  predniSONE (DELTASONE) 5 MG tablet prednisone 5 mg tablets in a dose pack  Take 1 dose pk by oral route as directed for 6 days.    [provider]  rizatriptan (MAXALT-MLT) 10 MG disintegrating tablet Take 1 tablet (10 mg total) by mouth as needed for migraine. May repeat in 2 hours if needed 12/20/18   Melvenia Beam, MD  Semaglutide, 1 MG/DOSE, 4 MG/3ML SOPN Inject 1 mg as directed once a week. Third dose. 09/26/21   Orma Render, NP  Semaglutide,0.25 or 0.5MG /DOS, (OZEMPIC, 0.25 OR 0.5 MG/DOSE,) 2 MG/1.5ML SOPN Inject 0.25 mg into the skin once a week for 28 days, THEN 0.5 mg once a week for 14 days. Initial dosing.. 10/14/21 11/25/21  Orma Render, NP  Semaglutide,0.25 or 0.5MG /DOS, 2 MG/1.5ML SOPN Inject 0.5 mg into the skin once a week. Second Pen. 09/26/21   Early, Coralee Pesa, NP  SYNTHROID 75 MCG tablet Take 75 mcg by mouth daily before breakfast. 02/15/20   Mosie Lukes, MD  venlafaxine XR (EFFEXOR-XR) 75 MG 24 hr capsule Take 1 capsule (75 mg total) by mouth daily with breakfast. 09/03/21   Melvenia Beam, MD      Allergies    Statins and Codeine    Review of Systems   Review of Systems  Respiratory:  Positive for cough and shortness of breath.  All other systems reviewed and are negative.  Physical Exam Updated Vital Signs BP (!) 129/93    Pulse 90    Temp 98.3 F (36.8 C) (Oral)    Resp (!) 26    Ht 5\' 5"  (1.651 m)    Wt 86.9 kg    SpO2 98%    BMI 31.88 kg/m  Physical Exam Vitals and nursing note reviewed.  Constitutional:      Comments: Tachypneic   HENT:     Head: Normocephalic.     Nose: Nose normal.     Mouth/Throat:     Mouth: Mucous membranes are dry.  Eyes:     Extraocular Movements: Extraocular movements intact.     Pupils: Pupils are equal, round, and reactive to light.  Cardiovascular:     Rate and Rhythm: Normal rate and regular rhythm.     Pulses: Normal pulses.  Pulmonary:     Comments: Tachypneic, mild  diffuse wheezing. Abdominal:     General: Abdomen is flat.     Palpations: Abdomen is soft.     Comments: Mild epigastric tenderness  Musculoskeletal:        General: Normal range of motion.     Cervical back: Normal range of motion and neck supple.  Skin:    General: Skin is warm.     Capillary Refill: Capillary refill takes less than 2 seconds.  Neurological:     General: No focal deficit present.     Mental Status: She is oriented to person, place, and time.  Psychiatric:        Mood and Affect: Mood normal.        Behavior: Behavior normal.    ED Results / Procedures / Treatments   Labs (all labs ordered are listed, but only abnormal results are displayed) Labs Reviewed  COMPREHENSIVE METABOLIC PANEL - Abnormal; Notable for the following components:      Result Value   Sodium 134 (*)    Potassium 2.3 (*)    Glucose, Bld 178 (*)    Creatinine, Ser 1.09 (*)    AST 67 (*)    ALT 45 (*)    Alkaline Phosphatase 144 (*)    GFR, Estimated 53 (*)    All other components within normal limits  RESP PANEL BY RT-PCR (FLU A&B, COVID) ARPGX2  CBC WITH DIFFERENTIAL/PLATELET  MAGNESIUM  BLOOD GAS, VENOUS    EKG EKG Interpretation  Date/Time:  Sunday November 03 2021 17:58:11 EST Ventricular Rate:  76 PR Interval:  151 QRS Duration: 95 QT Interval:  486 QTC Calculation: 547 R Axis:   -12 Text Interpretation: Sinus rhythm Borderline repolarization abnormality Prolonged QT interval prolonged QT new since previous Confirmed by Wandra Arthurs [phone]) on 11/03/2021 6:27:18 PM  Radiology CT CHEST ABDOMEN PELVIS W CONTRAST  Result Date: 11/03/2021 CLINICAL DATA:  Sepsis SOB, cough, vomiting, diarrhea EXAM: CT CHEST, ABDOMEN, AND PELVIS WITH CONTRAST TECHNIQUE: Multidetector CT imaging of the chest, abdomen and pelvis was performed following the standard protocol during bolus administration of intravenous contrast. RADIATION DOSE REDUCTION: This exam was performed according to the  departmental dose-optimization program which includes automated exposure control, adjustment of the mA and/or kV according to patient size and/or use of iterative reconstruction technique. CONTRAST:  179mL OMNIPAQUE IOHEXOL 300 MG/ML  SOLN COMPARISON:  Chest radiograph earlier today abdominopelvic CT 10/22/2015 FINDINGS: CT CHEST FINDINGS Cardiovascular: Upper normal heart size. There are coronary artery calcifications. The thoracic aorta is tortuous but nonaneurysmal. No aortic dissection.  Exam not tailored for pulmonary artery evaluation, allowing for this, no central pulmonary embolus. No pericardial effusion. Mediastinum/Nodes: No enlarged mediastinal or hilar lymph nodes. No esophageal wall thickening. Tiny hiatal hernia. Lungs/Pleura: Bronchial thickening is lower lobe predominant. Minimal linear upper lobe paramediastinal atelectasis or scarring. No airspace consolidation or evidence of pneumonia. No pleural effusion. There is a 4 mm ground-glass nodule at the right lung apex, series 4, image 27. No pulmonary mass. Musculoskeletal: There are no acute or suspicious osseous abnormalities. Mild thoracic degeneration with endplate spurring. No chest wall soft tissue abnormalities. CT ABDOMEN PELVIS FINDINGS Hepatobiliary: Post cholecystectomy. There is chronic intrahepatic biliary ductal dilatation. Chronic common bile duct dilatation with slight increase from prior, currently 17 mm. No focal liver lesion. Pancreas: No evidence of pancreatic mass. Mild fatty atrophy of the pancreatic head and uncinate process. Insert. Spleen: Normal in size without focal abnormality. Adrenals/Urinary Tract: Slight left adrenal thickening without dominant adrenal nodule. Normal right adrenal gland. No hydronephrosis or perinephric edema. Homogeneous renal enhancement with symmetric excretion on delayed phase imaging. Visualized renal calculi or focal renal lesion. Urinary bladder is physiologically distended without wall  thickening. Stomach/Bowel: Unremarkable stomach and duodenum. Occasional fluid-filled small bowel without small bowel inflammation. Appendectomy. There is mild hyperemia involving the ascending, descending and sigmoid colon. Occasional areas of colonic wall thickening in the descending and sigmoid. Multiple colonic diverticula, but no focally inflamed diverticulitis. Vascular/Lymphatic: Aortic atherosclerosis without aneurysm. Patent portal, splenic, mesenteric veins. No enlarged lymph nodes in the abdomen or pelvis. Reproductive: Quiescent uterus and ovaries.  Adnexal mass. Other: No ascites, free air, or abdominopelvic collection. There is a small fat containing umbilical hernia. Musculoskeletal: Lumbar degenerative change with primarily facet hypertrophy. There are no acute or suspicious osseous abnormalities. IMPRESSION: 1. Mild hyperemia involving the ascending, descending, and sigmoid colon with few areas of colonic wall thickening. Findings may represent mild colitis, however may be infectious or inflammatory. 2. Colonic diverticulosis without focal diverticulitis. 3. Biliary dilatation postcholecystectomy. Slight increased dilatation of the common bile duct since prior exam. Recommend correlation with LFTs. If LFTs are elevated, MRCP could be considered, only when patient is able to tolerate breath hold technique. 4. Bronchial thickening is lower lobe predominant, can be seen with bronchitis or reactive airways disease. 5. There is a 4 mm ground-glass nodule at the right lung apex, likely infectious or inflammatory. No routine follow-up for nodule of this size is recommended. Aortic Atherosclerosis (ICD10-I70.0). Electronically Signed   By: Keith Rake M.D.   On: 11/03/2021 19:22   DG Chest Port 1 View  Result Date: 11/03/2021 CLINICAL DATA:  SOB EXAM: PORTABLE CHEST 1 VIEW COMPARISON:  05/04/2014. FINDINGS: No consolidation. No visible pleural effusions or pneumothorax. Cardiomediastinal silhouette  is borderline enlarged, similar to prior. IMPRESSION: No evidence of acute cardiopulmonary disease. Electronically Signed   By: Margaretha Sheffield M.D.   On: 11/03/2021 18:04    Procedures Procedures    CRITICAL CARE Performed by: Wandra Arthurs   Total critical care time: 30 minutes  Critical care time was exclusive of separately billable procedures and treating other patients.  Critical care was necessary to treat or prevent imminent or life-threatening deterioration.  Critical care was time spent personally by me on the following activities: development of treatment plan with patient and/or surrogate as well as nursing, discussions with consultants, evaluation of patient's response to treatment, examination of patient, obtaining history from patient or surrogate, ordering and performing treatments and interventions, ordering and review of laboratory studies, ordering  and review of radiographic studies, pulse oximetry and re-evaluation of patient's condition.   Medications Ordered in ED Medications  potassium chloride 10 mEq in 100 mL IVPB (10 mEq Intravenous New Bag/Given 11/03/21 1939)  benzonatate (TESSALON) capsule 100 mg (has no administration in time range)  sodium chloride 0.9 % bolus 1,000 mL (0 mLs Intravenous Stopped 11/03/21 1858)  methylPREDNISolone sodium succinate (SOLU-MEDROL) 125 mg/2 mL injection 125 mg (125 mg Intravenous Given 11/03/21 1809)  magnesium sulfate IVPB 2 g 50 mL (0 g Intravenous Stopped 11/03/21 1858)  albuterol (PROVENTIL) (2.5 MG/3ML) 0.083% nebulizer solution 5 mg (5 mg Nebulization Given 11/03/21 1808)  ipratropium (ATROVENT) nebulizer solution 0.5 mg (0.5 mg Nebulization Given 11/03/21 1807)  potassium chloride SA (KLOR-CON M) CR tablet 40 mEq (40 mEq Oral Given 11/03/21 1933)  ondansetron (ZOFRAN) injection 4 mg (4 mg Intravenous Given 11/03/21 1931)  iohexol (OMNIPAQUE) 300 MG/ML solution 100 mL (100 mLs Intravenous Contrast Given 11/03/21 1900)    ED  Course/ Medical Decision Making/ A&P                           Medical Decision Making Jashiya Bassett is a 76 y.o. female here presenting with cough and wheezing and vomiting and abdominal pain and diarrhea.  Consider bronchitis versus pneumonia versus gastroenteritis versus bowel obstruction.  Plan to get CBC and CMP and chest x-ray.  Patient will likely need CT abdomen pelvis as well.  Will hydrate patient and give steroids and albuterol.  7:10 PM Patient's labs and EKG reviewed.  Patient has prolonged QT that is new.  Her potassium is 2.3 and her magnesium level is normal.  Patient received multiple rounds of potassium.  Chest x-ray showed no pneumonia.  CT chest and pelvis pending.  7:39 PM CT showed no obvious pneumonia. Patient may have colitis which I think is more viral in nature.  Since patient has prolonged QT from hypokalemia, patient will need admission.  I ordered multiple potassium runs    Problems Addressed: Bronchitis: acute illness or injury Gastroenteritis: acute illness or injury Hypokalemia: acute illness or injury Prolonged Q-T interval on ECG: acute illness or injury  Amount and/or Complexity of Data Reviewed Labs: ordered. Decision-making details documented in ED Course. Radiology: ordered and independent interpretation performed. Decision-making details documented in ED Course. ECG/medicine tests: ordered and independent interpretation performed. Decision-making details documented in ED Course.  Risk Prescription drug management. Decision regarding hospitalization.  Final Clinical Impression(s) / ED Diagnoses Final diagnoses:  Hypokalemia  Bronchitis  Gastroenteritis  Prolonged Q-T interval on ECG    Rx / DC Orders ED Discharge Orders     None         Drenda Freeze, MD 11/03/21 1941

## 2021-11-03 NOTE — ED Notes (Signed)
Patient transported to CT 

## 2021-11-03 NOTE — ED Triage Notes (Signed)
Sent from [facility] for pneumonia  Worsening SOB and cough Increased wob x 3-4 days  2 breathing treatments at [facility] in last hours and Xray  Diarrhea and vomiting Respiratory at bedside

## 2021-11-03 NOTE — ED Notes (Signed)
Potassium IV rate decreased to 50 mL/hr due to discomfort.  Carrier fluid started and medication infusing with NS at this time to reduce pain at site and in arm.

## 2021-11-03 NOTE — ED Notes (Signed)
X-ray at [facility]

## 2021-11-03 NOTE — ED Notes (Signed)
Patient reports increasing cough.  Noted to have strong nonproductive cough at this time.  MD aware and patient medicated per order

## 2021-11-04 ENCOUNTER — Encounter (HOSPITAL_COMMUNITY): Payer: Self-pay | Admitting: Internal Medicine

## 2021-11-04 DIAGNOSIS — Z79899 Other long term (current) drug therapy: Secondary | ICD-10-CM | POA: Diagnosis not present

## 2021-11-04 DIAGNOSIS — Z20822 Contact with and (suspected) exposure to covid-19: Secondary | ICD-10-CM | POA: Diagnosis not present

## 2021-11-04 DIAGNOSIS — Z66 Do not resuscitate: Secondary | ICD-10-CM | POA: Diagnosis present

## 2021-11-04 DIAGNOSIS — R059 Cough, unspecified: Secondary | ICD-10-CM | POA: Diagnosis present

## 2021-11-04 DIAGNOSIS — R739 Hyperglycemia, unspecified: Secondary | ICD-10-CM | POA: Diagnosis present

## 2021-11-04 DIAGNOSIS — E876 Hypokalemia: Secondary | ICD-10-CM | POA: Diagnosis not present

## 2021-11-04 DIAGNOSIS — R9431 Abnormal electrocardiogram [ECG] [EKG]: Secondary | ICD-10-CM | POA: Diagnosis present

## 2021-11-04 DIAGNOSIS — R0602 Shortness of breath: Secondary | ICD-10-CM | POA: Diagnosis not present

## 2021-11-04 DIAGNOSIS — E039 Hypothyroidism, unspecified: Secondary | ICD-10-CM | POA: Diagnosis not present

## 2021-11-04 DIAGNOSIS — B348 Other viral infections of unspecified site: Secondary | ICD-10-CM

## 2021-11-04 DIAGNOSIS — J208 Acute bronchitis due to other specified organisms: Secondary | ICD-10-CM | POA: Diagnosis not present

## 2021-11-04 DIAGNOSIS — Z794 Long term (current) use of insulin: Secondary | ICD-10-CM | POA: Diagnosis not present

## 2021-11-04 DIAGNOSIS — K529 Noninfective gastroenteritis and colitis, unspecified: Secondary | ICD-10-CM | POA: Diagnosis not present

## 2021-11-04 DIAGNOSIS — Z85828 Personal history of other malignant neoplasm of skin: Secondary | ICD-10-CM | POA: Diagnosis not present

## 2021-11-04 HISTORY — DX: Other viral infections of unspecified site: B34.8

## 2021-11-04 HISTORY — DX: Hyperglycemia, unspecified: R73.9

## 2021-11-04 HISTORY — DX: Abnormal electrocardiogram (ECG) (EKG): R94.31

## 2021-11-04 HISTORY — DX: Hypokalemia: E87.6

## 2021-11-04 LAB — RESPIRATORY PANEL BY PCR

## 2021-11-04 LAB — I-STAT VENOUS BLOOD GAS, ED
Acid-base deficit: 1 mmol/L (ref 0.0–2.0)
Bicarbonate: 22.2 mmol/L (ref 20.0–28.0)
Calcium, Ion: 1.13 mmol/L — ABNORMAL LOW (ref 1.15–1.40)
HCT: 44 % (ref 36.0–46.0)
Hemoglobin: 15 g/dL (ref 12.0–15.0)
O2 Saturation: 57 %
Potassium: 2.3 mmol/L — CL (ref 3.5–5.1)
Sodium: 138 mmol/L (ref 135–145)
TCO2: 23 mmol/L (ref 22–32)
pCO2, Ven: 33.3 mmHg — ABNORMAL LOW (ref 44–60)
pH, Ven: 7.433 — ABNORMAL HIGH (ref 7.25–7.43)
pO2, Ven: 28 mmHg — CL (ref 32–45)

## 2021-11-04 LAB — COMPREHENSIVE METABOLIC PANEL
ALT: 42 U/L (ref 0–44)
AST: 53 U/L — ABNORMAL HIGH (ref 15–41)
Albumin: 3.4 g/dL — ABNORMAL LOW (ref 3.5–5.0)
Alkaline Phosphatase: 142 U/L — ABNORMAL HIGH (ref 38–126)
Anion gap: 10 (ref 5–15)
BUN: 6 mg/dL — ABNORMAL LOW (ref 8–23)
CO2: 22 mmol/L (ref 22–32)
Calcium: 9.2 mg/dL (ref 8.9–10.3)
Chloride: 106 mmol/L (ref 98–111)
Creatinine, Ser: 0.9 mg/dL (ref 0.44–1.00)
GFR, Estimated: >60 mL/min (ref >60)
Glucose, Bld: 131 mg/dL — ABNORMAL HIGH (ref 70–99)
Potassium: 3.8 mmol/L (ref 3.5–5.1)
Sodium: 138 mmol/L (ref 135–145)
Total Bilirubin: 0.7 mg/dL (ref 0.3–1.2)
Total Protein: 6.3 g/dL — ABNORMAL LOW (ref 6.5–8.1)

## 2021-11-04 LAB — MAGNESIUM: Magnesium: 2.2 mg/dL (ref 1.7–2.4)

## 2021-11-04 MED ORDER — POTASSIUM CHLORIDE 10 MEQ/100ML IV SOLN
INTRAVENOUS | Status: AC
  Administered 2021-11-04: 10 meq via INTRAVENOUS
  Filled 2021-11-04: qty 100

## 2021-11-04 MED ORDER — VENLAFAXINE HCL ER 75 MG PO CP24
75.0000 mg | ORAL_CAPSULE | Freq: Every day | ORAL | Status: DC
  Administered 2021-11-05 – 2021-11-06 (×2): 75 mg via ORAL
  Filled 2021-11-04 (×2): qty 1

## 2021-11-04 MED ORDER — ALPRAZOLAM 0.5 MG PO TABS
0.5000 mg | ORAL_TABLET | Freq: Every evening | ORAL | Status: DC | PRN
  Administered 2021-11-04: 0.5 mg via ORAL
  Filled 2021-11-04: qty 1

## 2021-11-04 MED ORDER — ZOLPIDEM TARTRATE 5 MG PO TABS
5.0000 mg | ORAL_TABLET | Freq: Every evening | ORAL | Status: DC | PRN
  Administered 2021-11-05: 5 mg via ORAL
  Filled 2021-11-04: qty 1

## 2021-11-04 MED ORDER — GUAIFENESIN ER 600 MG PO TB12
600.0000 mg | ORAL_TABLET | Freq: Two times a day (BID) | ORAL | Status: DC | PRN
  Administered 2021-11-04: 600 mg via ORAL
  Filled 2021-11-04: qty 1

## 2021-11-04 MED ORDER — GABAPENTIN 300 MG PO CAPS
600.0000 mg | ORAL_CAPSULE | Freq: Every day | ORAL | Status: DC
  Administered 2021-11-04 – 2021-11-05 (×2): 600 mg via ORAL
  Filled 2021-11-04 (×2): qty 2

## 2021-11-04 MED ORDER — IPRATROPIUM-ALBUTEROL 0.5-2.5 (3) MG/3ML IN SOLN
3.0000 mL | Freq: Four times a day (QID) | RESPIRATORY_TRACT | Status: DC
  Administered 2021-11-04: 3 mL via RESPIRATORY_TRACT
  Filled 2021-11-04: qty 3

## 2021-11-04 MED ORDER — ENOXAPARIN SODIUM 40 MG/0.4ML IJ SOSY
40.0000 mg | PREFILLED_SYRINGE | INTRAMUSCULAR | Status: DC
  Administered 2021-11-04 – 2021-11-06 (×3): 40 mg via SUBCUTANEOUS
  Filled 2021-11-04 (×3): qty 0.4

## 2021-11-04 MED ORDER — ONDANSETRON HCL 4 MG/2ML IJ SOLN
4.0000 mg | Freq: Four times a day (QID) | INTRAMUSCULAR | Status: DC | PRN
  Administered 2021-11-04 (×2): 4 mg via INTRAVENOUS
  Filled 2021-11-04 (×2): qty 2

## 2021-11-04 MED ORDER — ALBUTEROL SULFATE (2.5 MG/3ML) 0.083% IN NEBU: 2.5000 mg | INHALATION_SOLUTION | RESPIRATORY_TRACT | Status: DC | PRN

## 2021-11-04 MED ORDER — OXYCODONE HCL 5 MG PO TABS
5.0000 mg | ORAL_TABLET | ORAL | Status: DC | PRN
  Administered 2021-11-04: 5 mg via ORAL
  Filled 2021-11-04: qty 1

## 2021-11-04 MED ORDER — COLESEVELAM HCL 625 MG PO TABS: 1875.0000 mg | ORAL_TABLET | Freq: Two times a day (BID) | ORAL | Status: DC

## 2021-11-04 MED ORDER — SODIUM CHLORIDE 0.9% FLUSH
3.0000 mL | Freq: Two times a day (BID) | INTRAVENOUS | Status: DC
  Administered 2021-11-04 – 2021-11-05 (×3): 3 mL via INTRAVENOUS

## 2021-11-04 MED ORDER — IPRATROPIUM-ALBUTEROL 0.5-2.5 (3) MG/3ML IN SOLN
3.0000 mL | Freq: Three times a day (TID) | RESPIRATORY_TRACT | Status: DC
  Administered 2021-11-04 – 2021-11-05 (×3): 3 mL via RESPIRATORY_TRACT
  Filled 2021-11-04 (×3): qty 3

## 2021-11-04 MED ORDER — LEVOTHYROXINE SODIUM 75 MCG PO TABS
75.0000 ug | ORAL_TABLET | Freq: Every day | ORAL | Status: DC
  Administered 2021-11-04 – 2021-11-06 (×3): 75 ug via ORAL
  Filled 2021-11-04 (×3): qty 1

## 2021-11-04 MED ORDER — PREDNISONE 20 MG PO TABS
40.0000 mg | ORAL_TABLET | Freq: Every day | ORAL | Status: DC
  Administered 2021-11-05 – 2021-11-06 (×2): 40 mg via ORAL
  Filled 2021-11-04 (×2): qty 2

## 2021-11-04 MED ORDER — ACETAMINOPHEN 325 MG PO TABS: 650.0000 mg | ORAL_TABLET | Freq: Four times a day (QID) | ORAL | Status: DC | PRN

## 2021-11-04 MED ORDER — LACTATED RINGERS IV SOLN: INTRAVENOUS | Status: AC

## 2021-11-04 MED ORDER — ACETAMINOPHEN 650 MG RE SUPP: 650.0000 mg | Freq: Four times a day (QID) | RECTAL | Status: DC | PRN

## 2021-11-04 MED ORDER — HYDRALAZINE HCL 20 MG/ML IJ SOLN: 5.0000 mg | INTRAMUSCULAR | Status: DC | PRN

## 2021-11-04 MED ORDER — METHYLPREDNISOLONE SODIUM SUCC 125 MG IJ SOLR
60.0000 mg | Freq: Two times a day (BID) | INTRAMUSCULAR | Status: AC
  Administered 2021-11-04 (×2): 60 mg via INTRAVENOUS
  Filled 2021-11-04 (×2): qty 2

## 2021-11-04 MED ORDER — BENZONATATE 100 MG PO CAPS
100.0000 mg | ORAL_CAPSULE | Freq: Three times a day (TID) | ORAL | Status: DC | PRN
  Administered 2021-11-04 – 2021-11-05 (×2): 100 mg via ORAL
  Filled 2021-11-04 (×3): qty 1

## 2021-11-04 MED ORDER — ONDANSETRON HCL 4 MG PO TABS: 4.0000 mg | ORAL_TABLET | Freq: Four times a day (QID) | ORAL | Status: DC | PRN

## 2021-11-04 NOTE — Assessment & Plan Note (Signed)
>>  ASSESSMENT AND PLAN FOR OBESITY WRITTEN ON 11/04/2021  1:46 PM BY Jonah Blue, MD  -Body mass index is 30.93 kg/m..  -Weight loss should be encouraged -Outpatient PCP/bariatric medicine f/u encouraged

## 2021-11-04 NOTE — Assessment & Plan Note (Signed)
-  Likely associated with dehydration, hypokalemia; has resolved since volume status is now normalized

## 2021-11-04 NOTE — Assessment & Plan Note (Signed)
-  h/o insulin resistance -Prior A1c was 5.3, so unlikely DM -Suspect that hyperglycemia is an acute phase response to her infection and is likely to normalize -No intervention is currently needed

## 2021-11-04 NOTE — H&P (Signed)
History and Physical    Patient: [name] Nolan WUJ:[id number] DOB: 11/15/1945 DOA: 11/03/2021 DOS: the patient was seen and examined on 11/04/2021 PCP: Orma Render, NP  Patient coming from: Home - lives alone; NOK: Daughter, Charlynne Cousins, [phone]   Chief Complaint: Viral symptoms  HPI: [name] Nolan is a 76 y.o. female with medical history significant of depression/anxiety; chronic back pain; HLD; and hypothyroidism presenting with n/v, cough, SOB.  Tuesday, she went out to eat with a friend.  That night, she developed n/v/d after eating at a fish house.  She thought it was something she ate, but it got worse.  She started coughing.  She started taking Mucinex and Nyquil and that would give her 3 hours of sleep. Yesterday, her daughter finally talked her into going Urgent Care.  She was given 2 breathing treatments but her breathing was getting worse and so they decided to go to [facility].  No n/v/d since she got to [facility].  Her cough also got better with a "miracle pill" at [facility].  +SOB with exertion.  No fever.     ER Course:  [facility] to [facility] transfer, per Dr. Alcario Drought:  76 yo F with bronchitis.  Also has prolonged QTc from gastroenteritis with K of 2.3.  Replacing K, on tele monitor.  QTc 550.     Review of Systems: As mentioned in the history of present illness. All other systems reviewed and are negative. Past Medical History:  Diagnosis Date   Absolute anemia 11/17/2017   Anxiety and depression 05/07/2014   Widowed in 2013 after caring for her husband with Lewy Body Dementia for 6 years    Basal cell carcinoma of right ear 02/26/2015   Removed by Dr [place]   Chronic back pain    "mid-back; stops at the very lowest part of my back" (11/07/2015)   Dysphagia, pharyngoesophageal phase 12/13/2014   Esophageal reflux    occ   Excessive daytime sleepiness 01/25/2015   Hashimoto's disease    Heart murmur    Hot flashes 05/27/2016   Hyperlipidemia    Hypothyroid     Insomnia    Joint pain    Low ferritin 08/27/2015   "took supplements for awhile" (11/07/2015)   Migraine    "under control w/daily RX right now" (11/07/2015)   Occipital neuralgia    Osteopenia 02/26/2015   Pneumonia    PONV (postoperative nausea and vomiting) 1974   after cholecystectomy   Skin cancer 02/26/2015   Right ear Removed by Dr Syble Creek   Vitamin B12 deficiency 09/01/2016   Vitamin D deficiency 03/02/2017   Past Surgical History:  Procedure Laterality Date   APPENDECTOMY  11/07/2015   BASAL CELL CARCINOMA EXCISION Right 02/26/2015   ear   CATARACT EXTRACTION Bilateral    CHOLECYSTECTOMY OPEN  09/15/1972   COLONOSCOPY  09/15/2004   LAPAROSCOPIC APPENDECTOMY N/A 11/07/2015   Procedure: APPENDECTOMY LAPAROSCOPIC;  Surgeon: Georganna Skeans, MD;  Location: [place];  Service: General;  Laterality: N/A;   LAPAROSCOPIC INCISIONAL / UMBILICAL / [place]  11/07/2015   UHR   LUMBAR LAMINECTOMY/DECOMPRESSION MICRODISCECTOMY N/A 03/21/2021   Procedure: Microlumbar decompression Lumbar four-five Central;  Surgeon: Susa Day, MD;  Location: [place];  Service: Orthopedics;  Laterality: N/A;   Tooth implant     at least 5 years ago per pt   TUBAL LIGATION  78/46/9629   UMBILICAL HERNIA REPAIR N/A 11/07/2015   Procedure: LAPAROSCOPIC UMBILICAL HERNIA;  Surgeon: Georganna Skeans, MD;  Location: [place];  Service: General;  Laterality: N/A;   Social History:  reports that she has never smoked. She has never used smokeless tobacco. She reports that she does not drink alcohol and does not use drugs.  Allergies  Allergen Reactions   Statins Other (See Comments)    Muscle pain   Codeine Nausea And Vomiting    Family History  Problem Relation Age of Onset   Congestive Heart Failure Mother    Hypertension Mother    Hyperlipidemia Mother    Heart disease Mother    Obesity Mother    Leukemia Father    Obesity Father    Kidney disease Brother    Cancer Brother        stage  4 kidney cancer, metastatic   Kidney cancer Brother    Leukemia Maternal Aunt    Congestive Heart Failure Maternal Grandmother    Arthritis Sister    Colon cancer Neg Hx     Prior to Admission medications   Medication Sig Start Date End Date Taking? Authorizing Provider  ALPRAZolam Duanne Moron) 0.5 MG tablet Take 1 tablet (0.5 mg total) by mouth at bedtime as needed for anxiety. 01/31/21   Mosie Lukes, MD  botulinum toxin Type A (BOTOX) 100 units SOLR injection INJECT 155 UNITS  INTRAMUSCULARLY EVERY 3  MONTHS (GIVEN AT MD OFFICE, DISCARD UNUSED AFTER 1ST  USE) 09/06/19   Melvenia Beam, MD  colesevelam (WELCHOL) 625 MG tablet colesevelam 625 mg tablet  TAKE 3 TABLETS (1,875 MG TOTAL) BY MOUTH 2 (TWO) TIMES DAILY WITH A MEAL.    [provider]  doxycycline (VIBRA-TABS) 100 MG tablet Take 1 tablet (100 mg total) by mouth 2 (two) times daily. 09/27/21   Orma Render, NP  ergocalciferol (VITAMIN D2) 1.25 MG (50000 UT) capsule ergocalciferol (vitamin D2) 1,250 mcg (50,000 unit) capsule  TAKE 1 CAPSULE (50,000 UNITS TOTAL) BY MOUTH EVERY 7 (SEVEN) DAYS    [provider]  gabapentin (NEURONTIN) 300 MG capsule TAKE ONE TO THREE CAPSULES (300MG -900MG ) AT BEDTIME 09/03/21   Melvenia Beam, MD  predniSONE (DELTASONE) 5 MG tablet prednisone 5 mg tablets in a dose pack  Take 1 dose pk by oral route as directed for 6 days.    [provider]  rizatriptan (MAXALT-MLT) 10 MG disintegrating tablet Take 1 tablet (10 mg total) by mouth as needed for migraine. May repeat in 2 hours if needed 12/20/18   Melvenia Beam, MD  Semaglutide, 1 MG/DOSE, 4 MG/3ML SOPN Inject 1 mg as directed once a week. Third dose. 09/26/21   Orma Render, NP  Semaglutide,0.25 or 0.5MG /DOS, (OZEMPIC, 0.25 OR 0.5 MG/DOSE,) 2 MG/1.5ML SOPN Inject 0.25 mg into the skin once a week for 28 days, THEN 0.5 mg once a week for 14 days. Initial dosing.. 10/14/21 11/25/21  Orma Render, NP  Semaglutide,0.25 or  0.5MG /DOS, 2 MG/1.5ML SOPN Inject 0.5 mg into the skin once a week. Second Pen. 09/26/21   Early, Coralee Pesa, NP  SYNTHROID 75 MCG tablet Take 75 mcg by mouth daily before breakfast. 02/15/20   Mosie Lukes, MD  venlafaxine XR (EFFEXOR-XR) 75 MG 24 hr capsule Take 1 capsule (75 mg total) by mouth daily with breakfast. 09/03/21   Melvenia Beam, MD    Physical Exam: Vitals:   11/04/21 0648 11/04/21 0652 11/04/21 0834 11/04/21 0910  BP: (!) 199/131 (!) 165/133  (!) 167/84  Pulse: 88 68 71 86  Resp: 20 20 20 20   Temp:  25 F (36.7 C) 98 F (36.7 C)  97.8 F (36.6 C)  TempSrc: Oral Oral  Oral  SpO2: 99% 99% 97% 100%  Weight:  84.3 kg    Height:  5\' 5"  (1.651 m)     General:  Appears ill but is in NAD Eyes:   EOMI, normal lids, iris ENT:  grossly normal hearing, lips & tongue, mmm; appropriate dentition Neck:  no LAD, masses or thyromegaly Cardiovascular:  RRR, no m/r/g. No LE edema.  Respiratory:   Diffuse wheezing with decreased air movement.  Mildly to moderately increased respiratory effort. Abdomen:  soft, NT, ND Skin:  no rash or induration seen on limited exam Musculoskeletal:  grossly normal tone BUE/BLE, good ROM, no bony abnormality Psychiatric:  blunted mood and affect, speech fluent and appropriate, AOx3   Neurologic:  CN 2-12 grossly intact, moves all extremities in coordinated fashion   Radiological Exams on Admission: Independently reviewed - see discussion in A/P where applicable  CT CHEST ABDOMEN PELVIS W CONTRAST  Result Date: 11/03/2021 CLINICAL DATA:  Sepsis SOB, cough, vomiting, diarrhea EXAM: CT CHEST, ABDOMEN, AND PELVIS WITH CONTRAST TECHNIQUE: Multidetector CT imaging of the chest, abdomen and pelvis was performed following the standard protocol during bolus administration of intravenous contrast. RADIATION DOSE REDUCTION: This exam was performed according to the departmental dose-optimization program which includes automated exposure control, adjustment of the  mA and/or kV according to patient size and/or use of iterative reconstruction technique. CONTRAST:  116mL OMNIPAQUE IOHEXOL 300 MG/ML  SOLN COMPARISON:  Chest radiograph earlier today abdominopelvic CT 10/22/2015 FINDINGS: CT CHEST FINDINGS Cardiovascular: Upper normal heart size. There are coronary artery calcifications. The thoracic aorta is tortuous but nonaneurysmal. No aortic dissection. Exam not tailored for pulmonary artery evaluation, allowing for this, no central pulmonary embolus. No pericardial effusion. Mediastinum/Nodes: No enlarged mediastinal or hilar lymph nodes. No esophageal wall thickening. Tiny hiatal hernia. Lungs/Pleura: Bronchial thickening is lower lobe predominant. Minimal linear upper lobe paramediastinal atelectasis or scarring. No airspace consolidation or evidence of pneumonia. No pleural effusion. There is a 4 mm ground-glass nodule at the right lung apex, series 4, image 27. No pulmonary mass. Musculoskeletal: There are no acute or suspicious osseous abnormalities. Mild thoracic degeneration with endplate spurring. No chest wall soft tissue abnormalities. CT ABDOMEN PELVIS FINDINGS Hepatobiliary: Post cholecystectomy. There is chronic intrahepatic biliary ductal dilatation. Chronic common bile duct dilatation with slight increase from prior, currently 17 mm. No focal liver lesion. Pancreas: No evidence of pancreatic mass. Mild fatty atrophy of the pancreatic head and uncinate process. Insert. Spleen: Normal in size without focal abnormality. Adrenals/Urinary Tract: Slight left adrenal thickening without dominant adrenal nodule. Normal right adrenal gland. No hydronephrosis or perinephric edema. Homogeneous renal enhancement with symmetric excretion on delayed phase imaging. Visualized renal calculi or focal renal lesion. Urinary bladder is physiologically distended without wall thickening. Stomach/Bowel: Unremarkable stomach and duodenum. Occasional fluid-filled small bowel without  small bowel inflammation. Appendectomy. There is mild hyperemia involving the ascending, descending and sigmoid colon. Occasional areas of colonic wall thickening in the descending and sigmoid. Multiple colonic diverticula, but no focally inflamed diverticulitis. Vascular/Lymphatic: Aortic atherosclerosis without aneurysm. Patent portal, splenic, mesenteric veins. No enlarged lymph nodes in the abdomen or pelvis. Reproductive: Quiescent uterus and ovaries.  Adnexal mass. Other: No ascites, free air, or abdominopelvic collection. There is a small fat containing umbilical hernia. Musculoskeletal: Lumbar degenerative change with primarily facet hypertrophy. There are no acute or suspicious osseous abnormalities. IMPRESSION: 1. Mild hyperemia involving the ascending,  descending, and sigmoid colon with few areas of colonic wall thickening. Findings may represent mild colitis, however may be infectious or inflammatory. 2. Colonic diverticulosis without focal diverticulitis. 3. Biliary dilatation postcholecystectomy. Slight increased dilatation of the common bile duct since prior exam. Recommend correlation with LFTs. If LFTs are elevated, MRCP could be considered, only when patient is able to tolerate breath hold technique. 4. Bronchial thickening is lower lobe predominant, can be seen with bronchitis or reactive airways disease. 5. There is a 4 mm ground-glass nodule at the right lung apex, likely infectious or inflammatory. No routine follow-up for nodule of this size is recommended. Aortic Atherosclerosis (ICD10-I70.0). Electronically Signed   By: Keith Rake M.D.   On: 11/03/2021 19:22   DG Chest Port 1 View  Result Date: 11/03/2021 CLINICAL DATA:  SOB EXAM: PORTABLE CHEST 1 VIEW COMPARISON:  05/04/2014. FINDINGS: No consolidation. No visible pleural effusions or pneumothorax. Cardiomediastinal silhouette is borderline enlarged, similar to prior. IMPRESSION: No evidence of acute cardiopulmonary disease.  Electronically Signed   By: Margaretha Sheffield M.D.   On: 11/03/2021 18:04    EKG: Independently reviewed.  NSR with rate 76; prolonged QTc 547; nonspecific ST changes with no evidence of acute ischemia   Labs on Admission: I have personally reviewed the available labs and imaging studies at the time of the admission.  Pertinent labs:    K+ 2.3 -> 3.8 Glucose 178 - 131 BUN 8/Creatinine 1.09/GFR 53 - stable AST 67/ALT 45 -> 53/42 Normal CBC COVID/flu negative Respiratory panel + for parainfluenza virus   Assessment and Plan: * Parainfluenza infection- (present on admission) -Patient with 6 days of n/v/d and respiratory symptoms -Respiratory virus panel shows parainfluenza infection -She appears to be improved from a GI standpoint but is still having respiratory symptoms -Mild LFT elevation is also improving -Will treat with steroids, Mucinex, Tessalon perles, Duonebs, Albuterol -Will observe overnight, anticipate dc tomorrow unless she feels better enough to dc later this evening  DNR (do not resuscitate)- (present on admission) -I have discussed code status with the patient and she would not desire resuscitation and would prefer to die a natural death should that situation arise. -She will need a gold out of facility DNR form at the time of discharge  Acute hyperglycemia- (present on admission) -h/o insulin resistance -Prior A1c was 5.3, so unlikely DM -Suspect that hyperglycemia is an acute phase response to her infection and is likely to normalize -No intervention is currently needed  Prolonged QT interval- (present on admission) -Likely associated with dehydration, hypokalemia; has resolved since volume status is now normalized  Hypokalemia- (present on admission) -Marked hypokalemia on presentation, likely from n/v/d -She has been effectively repleted and K+ has normalized -Mag++ also checked and WNL  Obesity- (present on admission) -Body mass index is 30.93 kg/m..   -Weight loss should be encouraged -Outpatient PCP/bariatric medicine f/u encouraged  Hypothyroidism- (present on admission) -Continue Synthroid at current dose for now  Mixed hyperlipidemia- (present on admission) -She never filled Welchol rx -Outpatient f/u recommended -Last LDL was markedly elevated at 169 so needs treatment despite statin interolance     Advance Care Planning:   Code Status: DNR   Consults: None  DVT Prophylaxis: Lovenox  Family Communication: None present; she is capable of communicating with her family at this time  Severity of Illness: The appropriate patient status for this patient is OBSERVATION. Observation status is judged to be reasonable and necessary in order to provide the required intensity of service to  ensure the patient's safety. The patient's presenting symptoms, physical exam findings, and initial radiographic and laboratory data in the context of their medical condition is felt to place them at decreased risk for further clinical deterioration. Furthermore, it is anticipated that the patient will be medically stable for discharge from the hospital within 2 midnights of admission.   Author: Karmen Bongo, MD 11/04/2021 1:52 PM  For on call review [email or web address].

## 2021-11-04 NOTE — ED Notes (Signed)
Report given to [facility].  All questions answered

## 2021-11-04 NOTE — Assessment & Plan Note (Addendum)
-  Patient with 6 days of n/v/d and respiratory symptoms -Respiratory virus panel shows parainfluenza infection -She appears to be improved from a GI standpoint but is still having respiratory symptoms -Mild LFT elevation is also improving -Will treat with steroids, Mucinex, Tessalon perles, Duonebs, Albuterol -Will observe overnight, anticipate dc tomorrow unless she feels better enough to dc later this evening

## 2021-11-04 NOTE — Assessment & Plan Note (Signed)
-  She never filled Welchol rx -Outpatient f/u recommended -Last LDL was markedly elevated at 169 so needs treatment despite statin interolance

## 2021-11-04 NOTE — Assessment & Plan Note (Signed)
-  Body mass index is 30.93 kg/m..  -Weight loss should be encouraged -Outpatient PCP/bariatric medicine f/u encouraged

## 2021-11-04 NOTE — Assessment & Plan Note (Signed)
-  Marked hypokalemia on presentation, likely from n/v/d -She has been effectively repleted and K+ has normalized -Mag++ also checked and WNL

## 2021-11-04 NOTE — Progress Notes (Signed)
°  Transition of Care [facility]) Screening Note   Patient Details  Name: [name] Nolan Date of Birth: 1946/08/18   Transition of Care [facility]) CM/SW Contact:    Tom-Johnson, Renea Ee, RN Phone Number: 11/04/2021, 2:30 PM  Patient is from home alone. Has one daughter who is very supportive. Admitted for Acute bronchitis. Independent with care prior to admission. Has a walker and handicapped bathroom at home. PCP is Early, Coralee Pesa, NP and uses [facility] in [place]. Transition of Care Department [facility]) has reviewed patient and no TOC needs have been identified or recommended at this time. TOC will continue to monitor patient advancement through interdisciplinary progression rounds. If new patient transition needs arise, please place a TOC consult.

## 2021-11-04 NOTE — Assessment & Plan Note (Signed)
>>  ASSESSMENT AND PLAN FOR HYPOTHYROIDISM WRITTEN ON 11/04/2021  1:47 PM BY Jonah Blue, MD  -Continue Synthroid at current dose for now

## 2021-11-04 NOTE — ED Notes (Signed)
Patient transported to [facility] via [place] at this time.  All personal belongings with patient.

## 2021-11-04 NOTE — Assessment & Plan Note (Signed)
-  I have discussed code status with the patient and she would not desire resuscitation and would prefer to die a natural death should that situation arise. ?-She will need a gold out of facility DNR form at the time of discharge ?

## 2021-11-04 NOTE — ED Notes (Signed)
Report given to Joelene Millin, Therapist, sports at [facility].  All questions answered.

## 2021-11-04 NOTE — Assessment & Plan Note (Signed)
-  Continue Synthroid at current dose for now  

## 2021-11-05 DIAGNOSIS — B348 Other viral infections of unspecified site: Secondary | ICD-10-CM | POA: Diagnosis not present

## 2021-11-05 LAB — CBC
HCT: 39.9 % (ref 36.0–46.0)
Hemoglobin: 12.9 g/dL (ref 12.0–15.0)
MCH: 29.5 pg (ref 26.0–34.0)
MCHC: 32.3 g/dL (ref 30.0–36.0)
MCV: 91.1 fL (ref 80.0–100.0)
Platelets: 210 10*3/uL (ref 150–400)
RBC: 4.38 MIL/uL (ref 3.87–5.11)
RDW: 14 % (ref 11.5–15.5)
WBC: 7.9 10*3/uL (ref 4.0–10.5)
nRBC: 0 % (ref 0.0–0.2)

## 2021-11-05 LAB — BASIC METABOLIC PANEL
Anion gap: 11 (ref 5–15)
BUN: 7 mg/dL — ABNORMAL LOW (ref 8–23)
CO2: 23 mmol/L (ref 22–32)
Calcium: 9.2 mg/dL (ref 8.9–10.3)
Chloride: 107 mmol/L (ref 98–111)
Creatinine, Ser: 0.94 mg/dL (ref 0.44–1.00)
GFR, Estimated: >60 mL/min (ref >60)
Glucose, Bld: 144 mg/dL — ABNORMAL HIGH (ref 70–99)
Potassium: 3.3 mmol/L — ABNORMAL LOW (ref 3.5–5.1)
Sodium: 141 mmol/L (ref 135–145)

## 2021-11-05 MED ORDER — POTASSIUM CHLORIDE CRYS ER 20 MEQ PO TBCR
40.0000 meq | EXTENDED_RELEASE_TABLET | Freq: Once | ORAL | Status: AC
  Administered 2021-11-05: 40 meq via ORAL
  Filled 2021-11-05: qty 2

## 2021-11-05 MED ORDER — LACTATED RINGERS IV SOLN: INTRAVENOUS | Status: DC

## 2021-11-05 MED ORDER — PREDNISONE 20 MG PO TABS
40.0000 mg | ORAL_TABLET | Freq: Every day | ORAL | 0 refills | Status: AC
Start: 2021-11-06 — End: 2021-11-08

## 2021-11-05 MED ORDER — ACETAMINOPHEN 325 MG PO TABS: 650.0000 mg | ORAL_TABLET | Freq: Four times a day (QID) | ORAL | Status: DC | PRN

## 2021-11-05 MED ORDER — BENZONATATE 100 MG PO CAPS: 100.0000 mg | ORAL_CAPSULE | Freq: Three times a day (TID) | ORAL | 0 refills | Status: AC | PRN

## 2021-11-05 MED ORDER — GUAIFENESIN ER 600 MG PO TB12: 600.0000 mg | ORAL_TABLET | Freq: Two times a day (BID) | ORAL | 0 refills | Status: AC

## 2021-11-05 MED ORDER — FAMOTIDINE 20 MG PO TABS: 20.0000 mg | ORAL_TABLET | Freq: Every day | ORAL | 0 refills | Status: DC

## 2021-11-05 MED ORDER — GABAPENTIN 300 MG PO CAPS: ORAL_CAPSULE | ORAL | 4 refills | Status: DC

## 2021-11-05 MED ORDER — ALBUTEROL SULFATE HFA 108 (90 BASE) MCG/ACT IN AERS: 2.0000 | INHALATION_SPRAY | Freq: Four times a day (QID) | RESPIRATORY_TRACT | 2 refills | Status: DC | PRN

## 2021-11-05 NOTE — TOC Transition Note (Signed)
Transition of Care [facility]) - CM/SW Discharge Note   Patient Details  Name: [name] Nolan MRN: [id number] Date of Birth: 1946-01-20  Transition of Care [facility]) CM/SW Contact:  Tom-Johnson, Renea Ee, RN Phone Number: 11/05/2021, 12:21 PM   Clinical Narrative:     Patient is scheduled for discharge today. Admitted for Acute Bronchitis. From home with daughter. Denies any needs and no recommendations noted. Daughter to transport at discharge. No further TOC needs noted.  Final next level of care: Home/Self Care Barriers to Discharge: Barriers Resolved   Patient Goals and CMS Choice Patient states their goals for this hospitalization and ongoing recovery are:: To return home CMS Medicare.gov Compare Post Acute Care list provided to:: Patient Choice offered to / list presented to : NA  Discharge Placement                       Discharge Plan and Services                DME Arranged: N/A DME Agency: NA       HH Arranged: NA HH Agency: NA        Social Determinants of Health (SDOH) Interventions     Readmission Risk Interventions No flowsheet data found.

## 2021-11-05 NOTE — Plan of Care (Signed)
Problem: Education: Goal: Knowledge of General Education information will improve Description: Including pain rating scale, medication(s)/side effects and non-pharmacologic comfort measures Outcome: Completed/Met   Problem: Health Behavior/Discharge Planning: Goal: Ability to manage health-related needs will improve Outcome: Completed/Met   Problem: Clinical Measurements: Goal: Will remain free from infection Outcome: Completed/Met Goal: Diagnostic test results will improve Outcome: Completed/Met   

## 2021-11-05 NOTE — Care Management Obs Status (Signed)
[place] NOTIFICATION   Patient Details  Name: [name] Nolan MRN: [id number] Date of Birth: 1946-07-31   Medicare Observation Status Notification Given:  Yes    Tom-Johnson, Renea Ee, [place] 11/05/2021, 9:00 AM

## 2021-11-05 NOTE — Discharge Summary (Signed)
[name] Nolan KNL:[id number] DOB: [birth year] DOA: 11/03/2021  PCP: Orma Render, NP  Admit date: 11/03/2021 Discharge date: 11/05/2021  Admitted From: home Disposition:  home  Recommendations for Outpatient Follow-up:  Follow up with PCP in 1 week Please obtain BMP/CBC in one week       Discharge Condition:Stable CODE STATUS:DNR  Diet recommendation: regular   Brief Summary: Per HPI: [name] Nolan is a 76 y.o. female with medical history significant of depression/anxiety; chronic back pain; HLD; and hypothyroidism presenting with n/v, cough, SOB.  Tuesday, she went out to eat with a friend.  That night, she developed n/v/d after eating at a fish house.  She thought it was something she ate, but it got worse.  She started coughing.  She started taking Mucinex and Nyquil and that would give her 3 hours of sleep. Yesterday, her daughter finally talked her into going Urgent Care.  She was given 2 breathing treatments but her breathing was getting worse and so they decided to go to [facility].  No n/v/d since she got to [facility].  Her cough also got better with a "miracle pill" at [facility].  +SOB with exertion.  No fever. Found with hypokalemia on admission and was replaced. Was found to be positive for parainfluenza virus. Was started on steroids and inhaler neb.This am feeling better. No further diarrhea. Was educated about hydration at home. Plans of care discussed with her daughter.  Parainfluenza infection- (present on admission) On supportive care Started on iv steroid and transition to po taper. Continue mucinex Albuterol MDI prn Tessalon     Acute hyperglycemia- (present on admission) -h/o insulin resistance -Prior A1c was 5.3, so unlikely DM -Suspect that hyperglycemia is an acute phase response to her infection  Can f/u with pcp  for further monitoring    Prolonged QT interval- (present on admission) -Likely associated with dehydration, hypokalemia; has resolved since  volume status is now normalized    Hypokalemia- (present on admission) Mag nml Given K supplementation   Obesity- (present on admission) -Body mass index is 30.93 kg/m..  -Weight loss should be encouraged    Hypothyroidism- (present on admission) Continue synthroid   Mixed hyperlipidemia- (present on admission) -She never filled Welchol rx -Outpatient f/u recommended -Last LDL was markedly elevated at 169 so needs treatment despite statin interolance      Discharge Diagnoses:  Principal Problem:   Parainfluenza infection Active Problems:   Mixed hyperlipidemia   Hypothyroidism   Obesity   Hypokalemia   Prolonged QT interval   Acute hyperglycemia   DNR (do not resuscitate)    Discharge Instructions  Discharge Instructions     Call MD for:  difficulty breathing, headache or visual disturbances   Complete by: As directed    Diet - low sodium heart healthy   Complete by: As directed    Diet - low sodium heart healthy   Complete by: As directed    Discharge instructions   Complete by: As directed    Make sure you hydrate and eat   Discharge instructions   Complete by: As directed    F/u with pcp in one week hydrate   Increase activity slowly   Complete by: As directed    Increase activity slowly   Complete by: As directed       Allergies as of 11/05/2021       Reactions   Statins Other (See Comments)   Muscle pain   Codeine Nausea And Vomiting  Medication List     STOP taking these medications    aspirin-acetaminophen-caffeine 250-250-65 MG tablet Commonly known as: EXCEDRIN MIGRAINE       TAKE these medications    acetaminophen 325 MG tablet Commonly known as: TYLENOL Take 2 tablets (650 mg total) by mouth every 6 (six) hours as needed for mild pain (or Fever >/= 101). What changed:  medication strength how much to take reasons to take this   albuterol 108 (90 Base) MCG/ACT inhaler Commonly known as: VENTOLIN HFA Inhale 2  puffs into the lungs every 6 (six) hours as needed for wheezing or shortness of breath.   ALPRAZolam 0.5 MG tablet Commonly known as: XANAX Take 1 tablet (0.5 mg total) by mouth at bedtime as needed for anxiety.   benzonatate 100 MG capsule Commonly known as: TESSALON Take 1 capsule (100 mg total) by mouth 3 (three) times daily as needed for up to 5 days for cough.   Botox 100 units Solr injection Generic drug: botulinum toxin Type A INJECT 155 UNITS  INTRAMUSCULARLY EVERY 3  MONTHS (GIVEN AT MD OFFICE, DISCARD UNUSED AFTER 1ST  USE)   colesevelam 625 MG tablet Commonly known as: WELCHOL colesevelam 625 mg tablet  TAKE 3 TABLETS (1,875 MG TOTAL) BY MOUTH 2 (TWO) TIMES DAILY WITH A MEAL.   famotidine 20 MG tablet Commonly known as: PEPCID Take 1 tablet (20 mg total) by mouth daily for 7 days.   gabapentin 300 MG capsule Commonly known as: NEURONTIN TAKE ONE TO THREE CAPSULES (300MG -900MG ) AT BEDTIME What changed:  how much to take how to take this when to take this additional instructions   guaiFENesin 600 MG 12 hr tablet Commonly known as: MUCINEX Take 1 tablet (600 mg total) by mouth 2 (two) times daily for 5 days.   predniSONE 20 MG tablet Commonly known as: DELTASONE Take 2 tablets (40 mg total) by mouth daily with breakfast for 2 days. Start taking on: November 06, 2021   rizatriptan 10 MG disintegrating tablet Commonly known as: MAXALT-MLT Take 1 tablet (10 mg total) by mouth as needed for migraine. May repeat in 2 hours if needed   Synthroid 75 MCG tablet Generic drug: levothyroxine Take 75 mcg by mouth daily before breakfast.   venlafaxine XR 75 MG 24 hr capsule Commonly known as: EFFEXOR-XR Take 1 capsule (75 mg total) by mouth daily with breakfast.        Follow-up Information     Early, Coralee Pesa, NP Follow up in 1 week(s).   Specialty: Nurse Practitioner Contact information: [address] [place] [address] [phone]                 Allergies  Allergen Reactions   Statins Other (See Comments)    Muscle pain   Codeine Nausea And Vomiting    Consultations:    Procedures/Studies: CT CHEST ABDOMEN PELVIS W CONTRAST  Result Date: 11/03/2021 CLINICAL DATA:  Sepsis SOB, cough, vomiting, diarrhea EXAM: CT CHEST, ABDOMEN, AND PELVIS WITH CONTRAST TECHNIQUE: Multidetector CT imaging of the chest, abdomen and pelvis was performed following the standard protocol during bolus administration of intravenous contrast. RADIATION DOSE REDUCTION: This exam was performed according to the departmental dose-optimization program which includes automated exposure control, adjustment of the mA and/or kV according to patient size and/or use of iterative reconstruction technique. CONTRAST:  157mL OMNIPAQUE IOHEXOL 300 MG/ML  SOLN COMPARISON:  Chest radiograph earlier today abdominopelvic CT 10/22/2015 FINDINGS: CT CHEST FINDINGS Cardiovascular: Upper normal  heart size. There are coronary artery calcifications. The thoracic aorta is tortuous but nonaneurysmal. No aortic dissection. Exam not tailored for pulmonary artery evaluation, allowing for this, no central pulmonary embolus. No pericardial effusion. Mediastinum/Nodes: No enlarged mediastinal or hilar lymph nodes. No esophageal wall thickening. Tiny hiatal hernia. Lungs/Pleura: Bronchial thickening is lower lobe predominant. Minimal linear upper lobe paramediastinal atelectasis or scarring. No airspace consolidation or evidence of pneumonia. No pleural effusion. There is a 4 mm ground-glass nodule at the right lung apex, series 4, image 27. No pulmonary mass. Musculoskeletal: There are no acute or suspicious osseous abnormalities. Mild thoracic degeneration with endplate spurring. No chest wall soft tissue abnormalities. CT ABDOMEN PELVIS FINDINGS Hepatobiliary: Post cholecystectomy. There is chronic intrahepatic biliary ductal dilatation. Chronic common bile duct dilatation with  slight increase from prior, currently 17 mm. No focal liver lesion. Pancreas: No evidence of pancreatic mass. Mild fatty atrophy of the pancreatic head and uncinate process. Insert. Spleen: Normal in size without focal abnormality. Adrenals/Urinary Tract: Slight left adrenal thickening without dominant adrenal nodule. Normal right adrenal gland. No hydronephrosis or perinephric edema. Homogeneous renal enhancement with symmetric excretion on delayed phase imaging. Visualized renal calculi or focal renal lesion. Urinary bladder is physiologically distended without wall thickening. Stomach/Bowel: Unremarkable stomach and duodenum. Occasional fluid-filled small bowel without small bowel inflammation. Appendectomy. There is mild hyperemia involving the ascending, descending and sigmoid colon. Occasional areas of colonic wall thickening in the descending and sigmoid. Multiple colonic diverticula, but no focally inflamed diverticulitis. Vascular/Lymphatic: Aortic atherosclerosis without aneurysm. Patent portal, splenic, mesenteric veins. No enlarged lymph nodes in the abdomen or pelvis. Reproductive: Quiescent uterus and ovaries.  Adnexal mass. Other: No ascites, free air, or abdominopelvic collection. There is a small fat containing umbilical hernia. Musculoskeletal: Lumbar degenerative change with primarily facet hypertrophy. There are no acute or suspicious osseous abnormalities. IMPRESSION: 1. Mild hyperemia involving the ascending, descending, and sigmoid colon with few areas of colonic wall thickening. Findings may represent mild colitis, however may be infectious or inflammatory. 2. Colonic diverticulosis without focal diverticulitis. 3. Biliary dilatation postcholecystectomy. Slight increased dilatation of the common bile duct since prior exam. Recommend correlation with LFTs. If LFTs are elevated, MRCP could be considered, only when patient is able to tolerate breath hold technique. 4. Bronchial thickening is  lower lobe predominant, can be seen with bronchitis or reactive airways disease. 5. There is a 4 mm ground-glass nodule at the right lung apex, likely infectious or inflammatory. No routine follow-up for nodule of this size is recommended. Aortic Atherosclerosis (ICD10-I70.0). Electronically Signed   By: Keith Rake M.D.   On: 11/03/2021 19:22   DG Chest Port 1 View  Result Date: 11/03/2021 CLINICAL DATA:  SOB EXAM: PORTABLE CHEST 1 VIEW COMPARISON:  05/04/2014. FINDINGS: No consolidation. No visible pleural effusions or pneumothorax. Cardiomediastinal silhouette is borderline enlarged, similar to prior. IMPRESSION: No evidence of acute cardiopulmonary disease. Electronically Signed   By: Margaretha Sheffield M.D.   On: 11/03/2021 18:04      Subjective:   Discharge Exam: Vitals:   11/05/21 0839 11/05/21 0952  BP:  134/74  Pulse: 71 70  Resp: 18 18  Temp:  97.6 F (36.4 C)  SpO2: 93% 97%   Vitals:   11/04/21 2052 11/05/21 0524 11/05/21 0839 11/05/21 0952  BP: 118/72 123/67  134/74  Pulse: 77 66 71 70  Resp: 19 18 18 18   Temp: 97.9 F (36.6 C) 98.4 F (36.9 C)  97.6 F (36.4 C)  TempSrc: Oral  Oral  SpO2: 95% 93% 93% 97%  Weight:      Height:        General: Pt is alert, awake, not in acute distress Cardiovascular: RRR, S1/S2 +, no rubs, no gallops Respiratory: CTA bilaterally, no wheezing, no rhonchi Abdominal: Soft, NT, ND, bowel sounds + Extremities: no edema, no cyanosis    The results of significant diagnostics from this hospitalization (including imaging, microbiology, ancillary and laboratory) are listed below for reference.     Microbiology: Recent Results (from the past 240 hour(s))  Resp Panel by RT-PCR (Flu A&B, Covid) Nasopharyngeal Swab     Status: None   Collection Time: 11/03/21  5:58 PM   Specimen: Nasopharyngeal Swab; Nasopharyngeal(NP) swabs in vial transport medium  Result Value Ref Range Status   SARS Coronavirus 2 by RT PCR NEGATIVE NEGATIVE  Final    Comment: (NOTE) SARS-CoV-2 target nucleic acids are NOT DETECTED.  The SARS-CoV-2 RNA is generally detectable in upper respiratory specimens during the acute phase of infection. The lowest concentration of SARS-CoV-2 viral copies this assay can detect is 138 copies/mL. A negative result does not preclude SARS-Cov-2 infection and should not be used as the sole basis for treatment or other patient management decisions. A negative result may occur with  improper specimen collection/handling, submission of specimen other than nasopharyngeal swab, presence of viral mutation(s) within the areas targeted by this assay, and inadequate number of viral copies(<138 copies/mL). A negative result must be combined with clinical observations, patient history, and epidemiological information. The expected result is Negative.  Fact Sheet for Patients:  EntrepreneurPulse.com.au  Fact Sheet for Healthcare Providers:  IncredibleEmployment.be  This test is no t yet approved or cleared by the [place] FDA and  has been authorized for detection and/or diagnosis of SARS-CoV-2 by FDA under an Emergency Use Authorization (EUA). This EUA will remain  in effect (meaning this test can be used) for the duration of the COVID-19 declaration under Section 564(b)(1) of the Act, 21 U.S.C.section 360bbb-3(b)(1), unless the authorization is terminated  or revoked sooner.       Influenza A by PCR NEGATIVE NEGATIVE Final   Influenza B by PCR NEGATIVE NEGATIVE Final    Comment: (NOTE) The Xpert Xpress SARS-CoV-2/FLU/RSV plus assay is intended as an aid in the diagnosis of influenza from Nasopharyngeal swab specimens and should not be used as a sole basis for treatment. Nasal washings and aspirates are unacceptable for Xpert Xpress SARS-CoV-2/FLU/RSV testing.  Fact Sheet for Patients: EntrepreneurPulse.com.au  Fact Sheet for Healthcare  Providers: IncredibleEmployment.be  This test is not yet approved or cleared by the [place] FDA and has been authorized for detection and/or diagnosis of SARS-CoV-2 by FDA under an Emergency Use Authorization (EUA). This EUA will remain in effect (meaning this test can be used) for the duration of the COVID-19 declaration under Section 564(b)(1) of the Act, 21 U.S.C. section 360bbb-3(b)(1), unless the authorization is terminated or revoked.  Performed at [facility], [address] [place], [place] [address]   Respiratory (~20 pathogens) panel by PCR     Status: Abnormal   Collection Time: 11/04/21  8:08 AM   Specimen: Nasopharyngeal Swab; Respiratory  Result Value Ref Range Status   Adenovirus NOT DETECTED NOT DETECTED Final   Coronavirus 229E NOT DETECTED NOT DETECTED Final    Comment: (NOTE) The Coronavirus on the Respiratory Panel, DOES NOT test for the novel  Coronavirus (2019 nCoV)    Coronavirus HKU1 NOT DETECTED NOT DETECTED  Final   Coronavirus NL63 NOT DETECTED NOT DETECTED Final   Coronavirus OC43 NOT DETECTED NOT DETECTED Final   Metapneumovirus NOT DETECTED NOT DETECTED Final   Rhinovirus / Enterovirus NOT DETECTED NOT DETECTED Final   Influenza A NOT DETECTED NOT DETECTED Final   Influenza B NOT DETECTED NOT DETECTED Final   Parainfluenza Virus 1 NOT DETECTED NOT DETECTED Final   Parainfluenza Virus 2 NOT DETECTED NOT DETECTED Final   Parainfluenza Virus 3 DETECTED (A) NOT DETECTED Final   Parainfluenza Virus 4 NOT DETECTED NOT DETECTED Final   Respiratory Syncytial Virus NOT DETECTED NOT DETECTED Final   Bordetella pertussis NOT DETECTED NOT DETECTED Final   Bordetella Parapertussis NOT DETECTED NOT DETECTED Final   Chlamydophila pneumoniae NOT DETECTED NOT DETECTED Final   Mycoplasma pneumoniae NOT DETECTED NOT DETECTED Final    Comment: Performed at [facility], [place] [address], [place], [place] [address]      Labs: BNP (last 3 results) No results for input(s): BNP in the last 8760 hours. Basic Metabolic Panel: Recent Labs  Lab 11/03/21 1741 11/03/21 1758 11/04/21 0831 11/05/21 0413  NA 134* 138 138 141  K 2.3* 2.3* 3.8 3.3*  CL 100  --  106 107  CO2 22  --  22 23  GLUCOSE 178*  --  131* 144*  BUN 8  --  6* 7*  CREATININE 1.09*  --  0.90 0.94  CALCIUM 9.2  --  9.2 9.2  MG 1.9  --  2.2  --    Liver Function Tests: Recent Labs  Lab 11/03/21 1741 11/04/21 0831  AST 67* 53*  ALT 45* 42  ALKPHOS 144* 142*  BILITOT 0.8 0.7  PROT 7.5 6.3*  ALBUMIN 4.0 3.4*   No results for input(s): LIPASE, AMYLASE in the last 168 hours. No results for input(s): AMMONIA in the last 168 hours. CBC: Recent Labs  Lab 11/03/21 1741 11/03/21 1758 11/05/21 0413  WBC 5.7  --  7.9  NEUTROABS 4.1  --   --   HGB 14.4 15.0 12.9  HCT 43.2 44.0 39.9  MCV 89.6  --  91.1  PLT 215  --  210   Cardiac Enzymes: No results for input(s): CKTOTAL, CKMB, CKMBINDEX, TROPONINI in the last 168 hours. BNP: Invalid input(s): POCBNP CBG: No results for input(s): GLUCAP in the last 168 hours. D-Dimer No results for input(s): DDIMER in the last 72 hours. Hgb A1c No results for input(s): HGBA1C in the last 72 hours. Lipid Profile No results for input(s): CHOL, HDL, LDLCALC, TRIG, CHOLHDL, LDLDIRECT in the last 72 hours. Thyroid function studies No results for input(s): TSH, T4TOTAL, T3FREE, THYROIDAB in the last 72 hours.  Invalid input(s): FREET3 Anemia work up No results for input(s): VITAMINB12, FOLATE, FERRITIN, TIBC, IRON, RETICCTPCT in the last 72 hours. Urinalysis    Component Value Date/Time   BILIRUBINUR neg 08/24/2014 1341   PROTEINUR neg 08/24/2014 1341   UROBILINOGEN negative 08/24/2014 1341   NITRITE neg 08/24/2014 1341   LEUKOCYTESUR Negative 08/24/2014 1341   Sepsis Labs Invalid input(s): PROCALCITONIN,  WBC,  LACTICIDVEN Microbiology Recent Results (from the past 240 hour(s))   Resp Panel by RT-PCR (Flu A&B, Covid) Nasopharyngeal Swab     Status: None   Collection Time: 11/03/21  5:58 PM   Specimen: Nasopharyngeal Swab; Nasopharyngeal(NP) swabs in vial transport medium  Result Value Ref Range Status   SARS Coronavirus 2 by RT PCR NEGATIVE NEGATIVE Final    Comment: (NOTE) SARS-CoV-2 target  nucleic acids are NOT DETECTED.  The SARS-CoV-2 RNA is generally detectable in upper respiratory specimens during the acute phase of infection. The lowest concentration of SARS-CoV-2 viral copies this assay can detect is 138 copies/mL. A negative result does not preclude SARS-Cov-2 infection and should not be used as the sole basis for treatment or other patient management decisions. A negative result may occur with  improper specimen collection/handling, submission of specimen other than nasopharyngeal swab, presence of viral mutation(s) within the areas targeted by this assay, and inadequate number of viral copies(<138 copies/mL). A negative result must be combined with clinical observations, patient history, and epidemiological information. The expected result is Negative.  Fact Sheet for Patients:  EntrepreneurPulse.com.au  Fact Sheet for Healthcare Providers:  IncredibleEmployment.be  This test is no t yet approved or cleared by the [place] FDA and  has been authorized for detection and/or diagnosis of SARS-CoV-2 by FDA under an Emergency Use Authorization (EUA). This EUA will remain  in effect (meaning this test can be used) for the duration of the COVID-19 declaration under Section 564(b)(1) of the Act, 21 U.S.C.section 360bbb-3(b)(1), unless the authorization is terminated  or revoked sooner.       Influenza A by PCR NEGATIVE NEGATIVE Final   Influenza B by PCR NEGATIVE NEGATIVE Final    Comment: (NOTE) The Xpert Xpress SARS-CoV-2/FLU/RSV plus assay is intended as an aid in the diagnosis of influenza from  Nasopharyngeal swab specimens and should not be used as a sole basis for treatment. Nasal washings and aspirates are unacceptable for Xpert Xpress SARS-CoV-2/FLU/RSV testing.  Fact Sheet for Patients: EntrepreneurPulse.com.au  Fact Sheet for Healthcare Providers: IncredibleEmployment.be  This test is not yet approved or cleared by the [place] FDA and has been authorized for detection and/or diagnosis of SARS-CoV-2 by FDA under an Emergency Use Authorization (EUA). This EUA will remain in effect (meaning this test can be used) for the duration of the COVID-19 declaration under Section 564(b)(1) of the Act, 21 U.S.C. section 360bbb-3(b)(1), unless the authorization is terminated or revoked.  Performed at [facility], [address] [place], [place] [address]   Respiratory (~20 pathogens) panel by PCR     Status: Abnormal   Collection Time: 11/04/21  8:08 AM   Specimen: Nasopharyngeal Swab; Respiratory  Result Value Ref Range Status   Adenovirus NOT DETECTED NOT DETECTED Final   Coronavirus 229E NOT DETECTED NOT DETECTED Final    Comment: (NOTE) The Coronavirus on the Respiratory Panel, DOES NOT test for the novel  Coronavirus (2019 nCoV)    Coronavirus HKU1 NOT DETECTED NOT DETECTED Final   Coronavirus NL63 NOT DETECTED NOT DETECTED Final   Coronavirus OC43 NOT DETECTED NOT DETECTED Final   Metapneumovirus NOT DETECTED NOT DETECTED Final   Rhinovirus / Enterovirus NOT DETECTED NOT DETECTED Final   Influenza A NOT DETECTED NOT DETECTED Final   Influenza B NOT DETECTED NOT DETECTED Final   Parainfluenza Virus 1 NOT DETECTED NOT DETECTED Final   Parainfluenza Virus 2 NOT DETECTED NOT DETECTED Final   Parainfluenza Virus 3 DETECTED (A) NOT DETECTED Final   Parainfluenza Virus 4 NOT DETECTED NOT DETECTED Final   Respiratory Syncytial Virus NOT DETECTED NOT DETECTED Final   Bordetella pertussis NOT DETECTED NOT DETECTED Final    Bordetella Parapertussis NOT DETECTED NOT DETECTED Final   Chlamydophila pneumoniae NOT DETECTED NOT DETECTED Final   Mycoplasma pneumoniae NOT DETECTED NOT DETECTED Final    Comment: Performed at [facility],  [address] [place], [place] [address]     Time coordinating discharge: Over 30 minutes  SIGNED:   Nolberto Hanlon, MD  [facility] 11/05/2021, 1:09 PM Pager   If 7PM-7AM, please contact night-coverage [email or web address] Password [email or web address]

## 2021-11-11 ENCOUNTER — Encounter (HOSPITAL_BASED_OUTPATIENT_CLINIC_OR_DEPARTMENT_OTHER): Payer: Self-pay | Admitting: Nurse Practitioner

## 2021-11-11 MED ORDER — BENZONATATE 100 MG PO CAPS: 100.0000 mg | ORAL_CAPSULE | Freq: Two times a day (BID) | ORAL | 0 refills | Status: DC | PRN

## 2021-11-14 ENCOUNTER — Encounter (HOSPITAL_BASED_OUTPATIENT_CLINIC_OR_DEPARTMENT_OTHER): Payer: Self-pay | Admitting: Nurse Practitioner

## 2021-11-14 NOTE — Telephone Encounter (Signed)
Schedule pt for tomorrow 3/3 due to her having another appt today 3/2. ?

## 2021-11-15 ENCOUNTER — Other Ambulatory Visit: Payer: Self-pay

## 2021-11-15 ENCOUNTER — Other Ambulatory Visit (HOSPITAL_BASED_OUTPATIENT_CLINIC_OR_DEPARTMENT_OTHER): Payer: Self-pay | Admitting: Nurse Practitioner

## 2021-11-15 ENCOUNTER — Ambulatory Visit (INDEPENDENT_AMBULATORY_CARE_PROVIDER_SITE_OTHER): Payer: Medicare PPO | Admitting: Nurse Practitioner

## 2021-11-15 DIAGNOSIS — R052 Subacute cough: Secondary | ICD-10-CM

## 2021-11-15 DIAGNOSIS — Z6832 Body mass index (BMI) 32.0-32.9, adult: Secondary | ICD-10-CM

## 2021-11-15 DIAGNOSIS — E782 Mixed hyperlipidemia: Secondary | ICD-10-CM

## 2021-11-15 DIAGNOSIS — E8881 Metabolic syndrome: Secondary | ICD-10-CM

## 2021-11-15 DIAGNOSIS — Z789 Other specified health status: Secondary | ICD-10-CM

## 2021-11-15 MED ORDER — ONDANSETRON HCL 4 MG PO TABS: 4.0000 mg | ORAL_TABLET | Freq: Three times a day (TID) | ORAL | 0 refills | Status: DC | PRN

## 2021-11-15 MED ORDER — DOXYCYCLINE HYCLATE 100 MG PO TABS: 100.0000 mg | ORAL_TABLET | Freq: Two times a day (BID) | ORAL | 0 refills | Status: DC

## 2021-11-15 MED ORDER — GUAIFENESIN ER 600 MG PO TB12: 1200.0000 mg | ORAL_TABLET | Freq: Two times a day (BID) | ORAL | 2 refills | Status: DC

## 2021-11-18 ENCOUNTER — Encounter (HOSPITAL_BASED_OUTPATIENT_CLINIC_OR_DEPARTMENT_OTHER): Payer: Self-pay | Admitting: Nurse Practitioner

## 2021-11-18 DIAGNOSIS — R052 Subacute cough: Secondary | ICD-10-CM

## 2021-11-18 HISTORY — DX: Subacute cough: R05.2

## 2021-11-18 NOTE — Assessment & Plan Note (Signed)
Suspect post viral inflammatory reaction with possible bacterial infection secondary to influenza.  ?Will send treatment with doxycycline and recommend coughing and deep breathing exercises. No alarm symptoms present today.  ?Continue with increased hydration and rest. Mucinex for congestion and cough. Zofran for nausea.  ?If no improvement in 1 week- let us know.  ?

## 2021-11-18 NOTE — Progress Notes (Signed)
Virtual Visit Encounter  telephone visit. ? ? ?I connected with  [name] Nolan on 11/18/21 at 11:10 AM EST by secure audio and/or video enabled telemedicine application. I verified that I am speaking with the correct person using two identifiers. ?  ?I introduced myself as a Designer, jewellery with the practice. The limitations of evaluation and management by telemedicine discussed with the patient and the availability of in person appointments. The patient expressed verbal understanding and consent to proceed. ? ?Participating parties in this visit include: Myself and patient ? ?The patient is: Patient Location: Home ?I am: Provider Location: Office/Clinic ?Subjective:   ? ?CC and HPI: [name] Nolan is a 76 y.o. year old female presenting for follow up of flu.  ?[name] Nolan endorses initially becoming ill about 3 weeks ago. She reports she started with cold-like symptoms and she tried to manage these on her own. She was taking mucinex and imodium for congestion and diarrhea.  ?She reports her symptoms continued to worsen and she eventually was seen in the UC in [facility] where her O2 saturations were low. She was given a breathing treatment and send home with instructions to monitor her O2 saturations.  ?She later presented to [facility] and was once again found to be hypoxic with hypokalemia. She was treated and transferred to [facility] where she was admitted. She reports she was admitted for 3 days and discharged home.  ?She tells me since discharge she still does not feel "right". She endorses negative COVID testing at the hospital with + parainfluenza 3.  ?She tells me she does not have any taste or smell, is still experiencing ShOB with exertion, nausea, cough, congestion, and generalized malaise. She denies fever, chills, body aches, dizziness, or pre-syncope/syncope.  ?She tells me she is drinking OK and is alternating water, ginger ale, and sports/electrolyte drinks to keep herself hydrated and her  electrolytes balanced.  ? ?Past medical history, Surgical history, Family history not pertinant except as noted below, Social history, Allergies, and medications have been entered into the medical record, reviewed, and corrections made.  ? ?Review of Systems:  ?All review of systems negative except what is listed in the HPI ? ?Objective:   ? ?Alert and oriented x 4 ?Speaking in clear sentences. Congestion present. Mild shortness of breath present but able to speak fully.  ?No distress. ? ?Impression and Recommendations:   ? ?Problem List Items Addressed This Visit   ? ? Subacute cough - Primary  ?  Suspect post viral inflammatory reaction with possible bacterial infection secondary to influenza.  ?Will send treatment with doxycycline and recommend coughing and deep breathing exercises. No alarm symptoms present today.  ?Continue with increased hydration and rest. Mucinex for congestion and cough. Zofran for nausea.  ?If no improvement in 1 week- let us know.  ?  ?  ? Relevant Medications  ? doxycycline (VIBRA-TABS) 100 MG tablet  ? ondansetron (ZOFRAN) 4 MG tablet  ? guaiFENesin (MUCINEX) 600 MG 12 hr tablet  ? ? ?orders and follow up as documented in EMR ?I discussed the assessment and treatment plan with the patient. The patient was provided an opportunity to ask questions and all were answered. The patient agreed with the plan and demonstrated an understanding of the instructions. ?  ?The patient was advised to call back or seek an in-person evaluation if the symptoms worsen or if the condition fails to improve as anticipated. ? ?Follow-Up: prn ? ?I provided 20 minutes of non-face-to-face interaction with this non face-to-face  encounter including intake, same-day documentation, and chart review.  ? ?Orma Render, NP , DNP, AGNP-c ?[facility] ?Primary Care & Sports Medicine at [facility] ?[phone] ?[phone] (fax) ? ?

## 2021-12-03 ENCOUNTER — Ambulatory Visit: Payer: Medicare PPO | Admitting: Neurology

## 2021-12-03 DIAGNOSIS — G43719 Chronic migraine without aura, intractable, without status migrainosus: Secondary | ICD-10-CM

## 2021-12-03 NOTE — Progress Notes (Signed)
Consent Form ?Botulism Toxin Injection For Chronic Migraine ? ?12/07/2021: Stable ?09/03/2021 stable: Doing extremely well at least 70% improvement continues in migraine frequency. 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 [name] Nolan is in a geriatric psych facility in [place]. ? ?Reviewed orally with patient, additionally signature is on file: ? ?Botulism toxin has been approved by the Federal drug administration for treatment of chronic migraine. Botulism toxin does not cure chronic migraine and it may not be effective in some patients. ? ?The administration of botulism toxin is accomplished by injecting a small amount of toxin into the muscles of the neck and head. Dosage must be titrated for each individual. Any benefits resulting from botulism toxin tend to wear off after 3 months with a repeat injection required if benefit is to be maintained. Injections are usually done every 3-4 months with maximum effect peak achieved by about 2 or 3 weeks. Botulism toxin is expensive and you should be sure of what costs you will incur resulting from the injection. ? ?The side effects of botulism toxin use for chronic migraine may include: ? ? -Transient, and usually mild, facial weakness with facial injections ? -Transient, and usually mild, head or neck weakness with head/neck injections ? -Reduction or loss of forehead facial animation due to forehead muscle weakness ? -Eyelid drooping ? -Dry eye ? -Pain at the site of injection or bruising at the site of injection ? -Double vision ? -Potential unknown long term risks ? ?Contraindications: You should not have Botox if you are pregnant, nursing, allergic to albumin, have an infection, skin condition, or muscle weakness at the site of the injection, or have myasthenia gravis, Lambert-Eaton syndrome, or ALS. ? ?It is also possible that as with any injection, there may be an allergic reaction or no effect from the medication. Reduced effectiveness after repeated injections is sometimes seen and  rarely infection at the injection site may occur. All care will be taken to prevent these side effects. If therapy is given over a long time, atrophy and wasting in the muscle injected may occur. Occasionally the patient's become refractory to treatment because they develop antibodies to the toxin. In this event, therapy needs to be modified. ? ?I have read the above information and consent to the administration of botulism toxin. ? ? ? ?BOTOX PROCEDURE NOTE FOR MIGRAINE HEADACHE ? ? ? ?Contraindications and precautions discussed with patient(above). Aseptic procedure was observed and patient tolerated procedure. Procedure performed by Dr. Georgia Dom ? ?The condition has existed for more than 6 months, and pt does not have a diagnosis of ALS, Myasthenia Gravis or Lambert-Eaton Syndrome.  Risks and benefits of injections discussed and pt agrees to proceed with the procedure.  Written consent obtained ? ?These injections are medically necessary. Pt  receives good benefits from these injections. These injections do not cause sedations or hallucinations which the oral therapies may cause. ? ?Description of procedure: ? ?The patient was placed in a sitting position. The standard protocol was used for Botox as follows, with 5 units of Botox injected at each site: ? ? ?-Procerus muscle, midline injection ? ?-Corrugator muscle, bilateral injection ? ?-Frontalis muscle, bilateral injection, with 2 sites each side, medial injection was performed in the upper one third of the frontalis muscle, in the region vertical from the medial inferior edge of the superior orbital rim. The lateral injection was again in the upper one third of the forehead vertically above the lateral limbus of the cornea, 1.5 cm lateral to the medial injection  site. ? ?-Temporalis muscle injection, 4 sites, bilaterally. The first injection was 3 cm above the tragus of the ear, second injection site was 1.5 cm to 3 cm up from the first injection site in  line with the tragus of the ear. The third injection site was 1.5-3 cm forward between the first 2 injection sites. The fourth injection site was 1.5 cm posterior to the second injection site.  ? ?-Occipitalis muscle injection, 3 sites, bilaterally. The first injection was done one half way between the occipital protuberance and the tip of the mastoid process behind the ear. The second injection site was done lateral and superior to the first, 1 fingerbreadth from the first injection. The third injection site was 1 fingerbreadth superiorly and medially from the first injection site. ? ?-Cervical paraspinal muscle injection, 2 sites, bilateral knee first injection site was 1 cm from the midline of the cervical spine, 3 cm inferior to the lower border of the occipital protuberance. The second injection site was 1.5 cm superiorly and laterally to the first injection site. ? ?-Trapezius muscle injection was performed at 3 sites, bilaterally. The first injection site was in the upper trapezius muscle halfway between the inflection point of the neck, and the acromion. The second injection site was one half way between the acromion and the first injection site. The third injection was done between the first injection site and the inflection point of the neck. ? ? ?Will return for repeat injection in 3 months. ? ? ?155 units of Botox was used, 45u Botox not injected was wasted. The patient tolerated the procedure well, there were no complications of the above procedure. ? ? ? ? ?

## 2021-12-03 NOTE — Progress Notes (Signed)
Botox- 200 units x 1 vial ?Lot:  ?Expiration: [id number] ?[place]: [phone] ? ?Bacteriostatic 0.9% Sodium Chloride- 80m total ?Lot: [id number] ?Expiration: 04/16/2023 ?[place] [id number]? ?[place]x: GM62.947?B/B ? ?

## 2021-12-11 ENCOUNTER — Other Ambulatory Visit (HOSPITAL_BASED_OUTPATIENT_CLINIC_OR_DEPARTMENT_OTHER): Payer: Self-pay | Admitting: Nurse Practitioner

## 2021-12-11 DIAGNOSIS — E782 Mixed hyperlipidemia: Secondary | ICD-10-CM

## 2021-12-11 DIAGNOSIS — Z789 Other specified health status: Secondary | ICD-10-CM

## 2021-12-11 DIAGNOSIS — Z6832 Body mass index (BMI) 32.0-32.9, adult: Secondary | ICD-10-CM

## 2021-12-11 DIAGNOSIS — E8881 Metabolic syndrome: Secondary | ICD-10-CM

## 2021-12-12 ENCOUNTER — Other Ambulatory Visit (HOSPITAL_BASED_OUTPATIENT_CLINIC_OR_DEPARTMENT_OTHER): Payer: Self-pay

## 2021-12-12 DIAGNOSIS — Z6832 Body mass index (BMI) 32.0-32.9, adult: Secondary | ICD-10-CM

## 2021-12-12 DIAGNOSIS — E782 Mixed hyperlipidemia: Secondary | ICD-10-CM

## 2021-12-12 DIAGNOSIS — E8881 Metabolic syndrome: Secondary | ICD-10-CM

## 2021-12-12 DIAGNOSIS — Z789 Other specified health status: Secondary | ICD-10-CM

## 2021-12-12 MED ORDER — OZEMPIC (0.25 OR 0.5 MG/DOSE) 2 MG/1.5ML ~~LOC~~ SOPN: 0.5000 mg | PEN_INJECTOR | SUBCUTANEOUS | 2 refills | Status: AC

## 2021-12-23 ENCOUNTER — Encounter (HOSPITAL_BASED_OUTPATIENT_CLINIC_OR_DEPARTMENT_OTHER): Payer: Self-pay | Admitting: Nurse Practitioner

## 2021-12-23 ENCOUNTER — Ambulatory Visit (INDEPENDENT_AMBULATORY_CARE_PROVIDER_SITE_OTHER): Payer: Medicare PPO | Admitting: Nurse Practitioner

## 2021-12-23 DIAGNOSIS — R052 Subacute cough: Secondary | ICD-10-CM

## 2021-12-23 DIAGNOSIS — F418 Other specified anxiety disorders: Secondary | ICD-10-CM

## 2021-12-23 NOTE — Progress Notes (Signed)
Virtual Visit Encounter telephone visit. ? ? ?I connected with  [name] Nolan on 12/23/21 at  1:50 PM EDT by secure audio telemedicine application. I verified that I am speaking with the correct person using two identifiers. ?  ?I introduced myself as a Designer, jewellery with the practice. The limitations of evaluation and management by telemedicine discussed with the patient and the availability of in person appointments. The patient expressed verbal understanding and consent to proceed. ? ?Participating parties in this visit include: Myself and patient ? ?The patient is: Patient Location: Home ?I am: Provider Location: Office/Clinic ?Subjective:   ? ?CC and HPI: [name] Nolan is a 76 y.o. year old female presenting for follow up of recent infection, cough.  ?She tells me she is doing much better. She is not having a cough any longer. She is not having any fevers, chills, congestion, cough, LE edema at this time. She tells me that she has her inhaler to use in the event she starts having any cough and that has been helpful, but she has not had to use it in the recent past.  ? ?She tells me that her husband passed away on 2022-12-05 th after a long illness. He had a stroke and lingered for a week, but was not responsive. She was traveling frequently to [place] to visit with him while he was passing. He had been in the hospital for about 2 years at the [place]. She tells me she is handling this fairly well. She is working with the [facility] right now to get everything settled.  ? ?She also dealing with some added stress with her family. Her daughter has recently moved in after having some problems with her husband. She reports things are a little more stressful with her there, but she also enjoys the company and she feels she can be a support to her daughter during this time.  ? ?Past medical history, Surgical history, Family history not pertinant except as noted below, Social history, Allergies, and  medications have been entered into the medical record, reviewed, and corrections made.  ? ?Review of Systems:  ?All review of systems negative except what is listed in the HPI ? ?Objective:   ? ?Alert and oriented x 4 ?Speaking in clear sentences with no shortness of breath. ?No distress. ? ?Impression and Recommendations:   ? ?Problem List Items Addressed This Visit   ? ? Depression with anxiety  ?  Doing well on effexor. Her husbands recent passing and daughter moving in has caused some increased stress, but she is handling this well. No concerns present today. Recommend she let me know immediately if she feels like her symptoms are exacerbating and we can discuss medication management or counseling. She agrees.  ?  ?  ? Subacute cough - Primary  ?  Resolved. No concerns present at this time. Recommend f/u if sx return.  ?  ?  ? ? ?current treatment plan is effective, no change in therapy ?I discussed the assessment and treatment plan with the patient. The patient was provided an opportunity to ask questions and all were answered. The patient agreed with the plan and demonstrated an understanding of the instructions. ?  ?The patient was advised to call back or seek an in-person evaluation if the symptoms worsen or if the condition fails to improve as anticipated. ? ?Follow-Up: prn ? ?I provided 17 minutes of non-face-to-face interaction with this non face-to-face encounter including intake, same-day documentation, and chart review.  ? ?Clarise Cruz  Katheran James, NP , DNP, AGNP-c ?[facility] ?Primary Care & Sports Medicine at [facility] ?[phone] ?[phone] (fax) ? ?

## 2021-12-23 NOTE — Assessment & Plan Note (Signed)
Doing well on effexor. Her husbands recent passing and daughter moving in has caused some increased stress, but she is handling this well. No concerns present today. Recommend she let me know immediately if she feels like her symptoms are exacerbating and we can discuss medication management or counseling. She agrees.  ?

## 2021-12-23 NOTE — Assessment & Plan Note (Signed)
Resolved. No concerns present at this time. Recommend f/u if sx return.  ?

## 2022-01-04 ENCOUNTER — Emergency Department (HOSPITAL_COMMUNITY)
Admission: EM | Admit: 2022-01-04 | Discharge: 2022-01-04 | Disposition: A | Discharge status: Home / Self Care | Payer: Medicare PPO | Attending: Emergency Medicine | Admitting: Emergency Medicine

## 2022-01-04 ENCOUNTER — Encounter (HOSPITAL_COMMUNITY): Payer: Self-pay | Admitting: *Deleted

## 2022-01-04 ENCOUNTER — Other Ambulatory Visit: Payer: Self-pay

## 2022-01-04 ENCOUNTER — Emergency Department (HOSPITAL_COMMUNITY): Payer: Medicare PPO

## 2022-01-04 DIAGNOSIS — E039 Hypothyroidism, unspecified: Secondary | ICD-10-CM | POA: Insufficient documentation

## 2022-01-04 DIAGNOSIS — M542 Cervicalgia: Secondary | ICD-10-CM | POA: Diagnosis not present

## 2022-01-04 DIAGNOSIS — R509 Fever, unspecified: Secondary | ICD-10-CM | POA: Diagnosis not present

## 2022-01-04 DIAGNOSIS — J9811 Atelectasis: Secondary | ICD-10-CM | POA: Diagnosis not present

## 2022-01-04 DIAGNOSIS — M6281 Muscle weakness (generalized): Secondary | ICD-10-CM | POA: Diagnosis not present

## 2022-01-04 DIAGNOSIS — R918 Other nonspecific abnormal finding of lung field: Secondary | ICD-10-CM | POA: Diagnosis not present

## 2022-01-04 DIAGNOSIS — R300 Dysuria: Secondary | ICD-10-CM | POA: Diagnosis not present

## 2022-01-04 DIAGNOSIS — R531 Weakness: Secondary | ICD-10-CM

## 2022-01-04 DIAGNOSIS — G4489 Other headache syndrome: Secondary | ICD-10-CM | POA: Diagnosis not present

## 2022-01-04 DIAGNOSIS — Z20822 Contact with and (suspected) exposure to covid-19: Secondary | ICD-10-CM | POA: Insufficient documentation

## 2022-01-04 DIAGNOSIS — R42 Dizziness and giddiness: Secondary | ICD-10-CM | POA: Diagnosis not present

## 2022-01-04 LAB — COMPREHENSIVE METABOLIC PANEL
ALT: 37 U/L (ref 0–44)
AST: 49 U/L — ABNORMAL HIGH (ref 15–41)
Albumin: 3.8 g/dL (ref 3.5–5.0)
Alkaline Phosphatase: 128 U/L — ABNORMAL HIGH (ref 38–126)
Anion gap: 8 (ref 5–15)
BUN: 10 mg/dL (ref 8–23)
CO2: 24 mmol/L (ref 22–32)
Calcium: 9.2 mg/dL (ref 8.9–10.3)
Chloride: 105 mmol/L (ref 98–111)
Creatinine, Ser: 0.97 mg/dL (ref 0.44–1.00)
GFR, Estimated: >60 mL/min (ref >60)
Glucose, Bld: 107 mg/dL — ABNORMAL HIGH (ref 70–99)
Potassium: 3.4 mmol/L — ABNORMAL LOW (ref 3.5–5.1)
Sodium: 137 mmol/L (ref 135–145)
Total Bilirubin: 1.5 mg/dL — ABNORMAL HIGH (ref 0.3–1.2)
Total Protein: 7.5 g/dL (ref 6.5–8.1)

## 2022-01-04 LAB — CBC WITH DIFFERENTIAL/PLATELET
Abs Immature Granulocytes: 0.02 10*3/uL (ref 0.00–0.07)
Basophils Absolute: 0 10*3/uL (ref 0.0–0.1)
Basophils Relative: 1 %
Eosinophils Absolute: 0 10*3/uL (ref 0.0–0.5)
Eosinophils Relative: 0 %
HCT: 41.1 % (ref 36.0–46.0)
Hemoglobin: 13.4 g/dL (ref 12.0–15.0)
Immature Granulocytes: 0 %
Lymphocytes Relative: 5 %
Lymphs Abs: 0.4 10*3/uL — ABNORMAL LOW (ref 0.7–4.0)
MCH: 29.6 pg (ref 26.0–34.0)
MCHC: 32.6 g/dL (ref 30.0–36.0)
MCV: 90.9 fL (ref 80.0–100.0)
Monocytes Absolute: 0.5 10*3/uL (ref 0.1–1.0)
Monocytes Relative: 7 %
Neutro Abs: 5.8 10*3/uL (ref 1.7–7.7)
Neutrophils Relative %: 87 %
Platelets: 164 10*3/uL (ref 150–400)
RBC: 4.52 MIL/uL (ref 3.87–5.11)
RDW: 13.9 % (ref 11.5–15.5)
WBC: 6.7 10*3/uL (ref 4.0–10.5)
nRBC: 0 % (ref 0.0–0.2)

## 2022-01-04 LAB — URINALYSIS, ROUTINE W REFLEX MICROSCOPIC
Bilirubin Urine: NEGATIVE
Glucose, UA: NEGATIVE mg/dL
Ketones, ur: 20 mg/dL — AB
Nitrite: NEGATIVE
Protein, ur: NEGATIVE mg/dL
Specific Gravity, Urine: 1.008 (ref 1.005–1.030)
pH: 6 (ref 5.0–8.0)

## 2022-01-04 LAB — PROTIME-INR
INR: 1.1 (ref 0.8–1.2)
Prothrombin Time: 13.7 seconds (ref 11.4–15.2)

## 2022-01-04 LAB — LACTIC ACID, PLASMA: Lactic Acid, Venous: 1.3 mmol/L (ref 0.5–1.9)

## 2022-01-04 LAB — RESP PANEL BY RT-PCR (FLU A&B, COVID) ARPGX2
Influenza A by PCR: NEGATIVE
Influenza B by PCR: NEGATIVE
SARS Coronavirus 2 by RT PCR: NEGATIVE

## 2022-01-04 LAB — APTT: aPTT: 29 seconds (ref 24–36)

## 2022-01-04 LAB — TROPONIN I (HIGH SENSITIVITY): Troponin I (High Sensitivity): 4 ng/L (ref <18)

## 2022-01-04 MED ORDER — SODIUM CHLORIDE 0.9 % IV SOLN
2.0000 g | INTRAVENOUS | Status: DC
  Administered 2022-01-04: 2 g via INTRAVENOUS
  Filled 2022-01-04: qty 20

## 2022-01-04 MED ORDER — SODIUM CHLORIDE 0.9 % IV BOLUS (SEPSIS)
1000.0000 mL | Freq: Once | INTRAVENOUS | Status: AC
  Administered 2022-01-04: 1000 mL via INTRAVENOUS

## 2022-01-04 MED ORDER — LACTATED RINGERS IV SOLN: INTRAVENOUS | Status: DC

## 2022-01-04 MED ORDER — DIPHENHYDRAMINE HCL 50 MG/ML IJ SOLN
25.0000 mg | Freq: Once | INTRAMUSCULAR | Status: AC
  Administered 2022-01-04: 25 mg via INTRAVENOUS
  Filled 2022-01-04: qty 1

## 2022-01-04 MED ORDER — CEPHALEXIN 500 MG PO CAPS: 500.0000 mg | ORAL_CAPSULE | Freq: Four times a day (QID) | ORAL | 0 refills | Status: DC

## 2022-01-04 MED ORDER — KETOROLAC TROMETHAMINE 30 MG/ML IJ SOLN
30.0000 mg | Freq: Once | INTRAMUSCULAR | Status: AC
  Administered 2022-01-04: 30 mg via INTRAVENOUS
  Filled 2022-01-04: qty 1

## 2022-01-04 MED ORDER — ACETAMINOPHEN 500 MG PO TABS
1000.0000 mg | ORAL_TABLET | Freq: Once | ORAL | Status: AC
  Administered 2022-01-04: 1000 mg via ORAL
  Filled 2022-01-04: qty 2

## 2022-01-04 MED ORDER — METOCLOPRAMIDE HCL 5 MG/ML IJ SOLN
10.0000 mg | Freq: Once | INTRAMUSCULAR | Status: AC
  Administered 2022-01-04: 10 mg via INTRAVENOUS
  Filled 2022-01-04: qty 2

## 2022-01-04 NOTE — ED Provider Notes (Signed)
Signout from Dr. Sabra Heck.  76 year old female here with intermittent dysuria and abdominal discomfort intermittent chest pain headache nausea.  Generalized weakness.  Self treated with Macrobid without improvement.  Reported fever although none here.  Work-up has been fairly unremarkable.  No indications for further imaging.  Plan is to follow-up on urinalysis and treat if necessary ?Physical Exam  ?BP (!) 154/104   Pulse 81   Temp 99.9 ?F (37.7 ?C) (Oral)   Resp (!) 29   Ht '5\' 5"'$  (1.651 m)   Wt 79.8 kg   SpO2 92%   BMI 29.29 kg/m?  ? ?Physical Exam ? ?Procedures  ?Procedures ? ?ED Course / MDM  ?  ?Medical Decision Making ?Amount and/or Complexity of Data Reviewed ?Labs: ordered. ?Radiology: ordered. ?ECG/medicine tests: ordered. ? ?Risk ?OTC drugs. ?Prescription drug management. ? ? ?Patient's urinalysis equivocal for infection.  Will send for culture.  Reviewed with patient.  She is awake although drowsy.  Family members are with her.  Reviewed work-up with them also.  They are comfortable plan for taking her home and outpatient follow-up.  Recommended close follow-up with PCP.  Return instructions discussed. ? ? ? ? ?  ?Hayden Rasmussen, MD ?01/05/22 1020 ? ?

## 2022-01-04 NOTE — ED Provider Notes (Signed)
?[facility] ?Provider Note ? ? ?CSN: [id number] ?Arrival date & time: 01/04/22  1238 ? ?  ? ?History ? ?Chief Complaint  ?Patient presents with  ? Weakness  ? ? ?Jeffrie Stander is a 76 y.o. female. ? ? ?Weakness ? ?This patient is a 76 year old female, she has a history of migraine headaches in fact she has been prescribed Maxalt,, she has known history of depression and anxiety as well as chronic back pain and hypothyroidism and had actually been admitted to the hospital in February 2023 during which time the patient states that she is most certain that she had COVID-19.  She ended up having parainfluenza virus but because she lost her sense of smell and taste for a short time she was absolutely convinced that this was related to COVID-19. ? ?She presents today stating that for the last couple of weeks she has had intermittent dysuria, some abdominal discomfort as well as some intermittent chest pain, headaches and nausea.  She took up to 3 Macrobid last night but that did not seem to help, this was given to her by her sister. ? ?Denies stiff neck, denies diarrhea, denies rashes, insect bites, tick bites or recent travel ? ?Arrives by paramedic transport with reported temperature of over 101 but normal vital signs otherwise ? ?Home Medications ?Prior to Admission medications   ?Medication Sig Start Date End Date Taking? Authorizing Provider  ?acetaminophen (TYLENOL) 325 MG tablet Take 2 tablets (650 mg total) by mouth every 6 (six) hours as needed for mild pain (or Fever >/= 101). 11/05/21  Yes Nolberto Hanlon, MD  ?albuterol (VENTOLIN HFA) 108 (90 Base) MCG/ACT inhaler Inhale 2 puffs into the lungs every 6 (six) hours as needed for wheezing or shortness of breath. 11/05/21  Yes Nolberto Hanlon, MD  ?ALPRAZolam Duanne Moron) 0.5 MG tablet Take 1 tablet (0.5 mg total) by mouth at bedtime as needed for anxiety. 01/31/21  Yes Mosie Lukes, MD  ?botulinum toxin Type A (BOTOX) 100 units SOLR injection  INJECT 155 UNITS  INTRAMUSCULARLY EVERY 3  MONTHS (GIVEN AT MD OFFICE, DISCARD UNUSED AFTER 1ST  USE) 09/06/19  Yes Melvenia Beam, MD  ?cephALEXin (KEFLEX) 500 MG capsule Take 1 capsule (500 mg total) by mouth 4 (four) times daily. 01/04/22  Yes Hayden Rasmussen, MD  ?gabapentin (NEURONTIN) 300 MG capsule TAKE ONE TO THREE CAPSULES ('300MG'$ -'900MG'$ ) AT BEDTIME 11/05/21  Yes Nolberto Hanlon, MD  ?ondansetron (ZOFRAN) 4 MG tablet Take 1 tablet (4 mg total) by mouth every 8 (eight) hours as needed for nausea or vomiting. 11/15/21  Yes Early, Coralee Pesa, NP  ?rizatriptan (MAXALT-MLT) 10 MG disintegrating tablet Take 1 tablet (10 mg total) by mouth as needed for migraine. May repeat in 2 hours if needed 12/20/18  Yes Melvenia Beam, MD  ?SYNTHROID 75 MCG tablet Take 75 mcg by mouth daily before breakfast. 02/15/20  Yes Mosie Lukes, MD  ?venlafaxine XR (EFFEXOR-XR) 75 MG 24 hr capsule Take 1 capsule (75 mg total) by mouth daily with breakfast. 09/03/21  Yes Melvenia Beam, MD  ?famotidine (PEPCID) 20 MG tablet Take 1 tablet (20 mg total) by mouth daily for 7 days. ?Patient not taking: Reported on 01/04/2022 11/05/21 11/12/21  Nolberto Hanlon, MD  ?Semaglutide,0.25 or 0.'5MG'$ /DOS, (OZEMPIC, 0.25 OR 0.5 MG/DOSE,) 2 MG/1.5ML SOPN Inject 0.5 mg as directed once a week. ?Patient not taking: Reported on 01/04/2022 12/12/21 01/11/22  Orma Render, NP  ?   ? ?Allergies    ?  Statins and Codeine   ? ?Review of Systems   ?Review of Systems  ?Neurological:  Positive for weakness.  ?All other systems reviewed and are negative. ? ?Physical Exam ?Updated Vital Signs ?BP 138/83   Pulse 90   Temp 100.3 ?F (37.9 ?C) (Oral)   Resp (!) 23   Ht 1.651 m ('5\' 5"'$ )   Wt 79.8 kg   SpO2 96%   BMI 29.29 kg/m?  ?Physical Exam ?Vitals and nursing note reviewed.  ?Constitutional:   ?   General: She is not in acute distress. ?   Appearance: She is well-developed.  ?HENT:  ?   Head: Normocephalic and atraumatic.  ?   Right Ear: Tympanic membrane normal.  ?   Left  Ear: Tympanic membrane normal.  ?   Mouth/Throat:  ?   Mouth: Mucous membranes are dry.  ?   Pharynx: No oropharyngeal exudate.  ?Eyes:  ?   General: No scleral icterus.    ?   Right eye: No discharge.     ?   Left eye: No discharge.  ?   Conjunctiva/sclera: Conjunctivae normal.  ?   Pupils: Pupils are equal, round, and reactive to light.  ?Neck:  ?   Thyroid: No thyromegaly.  ?   Vascular: No JVD.  ?Cardiovascular:  ?   Rate and Rhythm: Normal rate and regular rhythm.  ?   Heart sounds: Normal heart sounds. No murmur heard. ?  No friction rub. No gallop.  ?Pulmonary:  ?   Effort: Pulmonary effort is normal. No respiratory distress.  ?   Breath sounds: Normal breath sounds. No wheezing or rales.  ?Abdominal:  ?   General: Bowel sounds are normal. There is no distension.  ?   Palpations: Abdomen is soft. There is no mass.  ?   Tenderness: There is no abdominal tenderness.  ?Musculoskeletal:     ?   General: No tenderness. Normal range of motion.  ?   Cervical back: Normal range of motion and neck supple.  ?   Right lower leg: No edema.  ?   Left lower leg: No edema.  ?Lymphadenopathy:  ?   Cervical: No cervical adenopathy.  ?Skin: ?   General: Skin is warm and dry.  ?   Findings: No erythema or rash.  ?Neurological:  ?   General: No focal deficit present.  ?   Mental Status: She is alert.  ?   Coordination: Coordination normal.  ?   Comments: Patient is well-appearing, has no cranial nerve deficits, able to speak in full sentences, able to move all 4 extremities, she does not have any vertigo with head movement side-to-side and there is no nystagmus.  ?Psychiatric:     ?   Behavior: Behavior normal.  ? ? ?ED Results / Procedures / Treatments   ?Labs ?(all labs ordered are listed, but only abnormal results are displayed) ?Labs Reviewed  ?COMPREHENSIVE METABOLIC PANEL - Abnormal; Notable for the following components:  ?    Result Value  ? Potassium 3.4 (*)   ? Glucose, Bld 107 (*)   ? AST 49 (*)   ? Alkaline  Phosphatase 128 (*)   ? Total Bilirubin 1.5 (*)   ? All other components within normal limits  ?CBC WITH DIFFERENTIAL/PLATELET - Abnormal; Notable for the following components:  ? Lymphs Abs 0.4 (*)   ? All other components within normal limits  ?URINALYSIS, ROUTINE W REFLEX MICROSCOPIC - Abnormal; Notable for the following components:  ?  Hgb urine dipstick MODERATE (*)   ? Ketones, ur 20 (*)   ? Leukocytes,Ua TRACE (*)   ? Bacteria, UA RARE (*)   ? All other components within normal limits  ?RESP PANEL BY RT-PCR (FLU A&B, COVID) [id number]  ?CULTURE, BLOOD (ROUTINE X 2)  ?CULTURE, BLOOD (ROUTINE X 2)  ?URINE CULTURE  ?LACTIC ACID, PLASMA  ?PROTIME-INR  ?APTT  ?TROPONIN I (HIGH SENSITIVITY)  ? ? ?EKG ?EKG Interpretation ? ?Date/Time:  Saturday January 04 2022 13:22:01 EDT ?Ventricular Rate:  86 ?PR Interval:  150 ?QRS Duration: 97 ?QT Interval:  352 ?QTC Calculation: 421 ?R Axis:   -20 ?Text Interpretation: Sinus rhythm Probable LVH with secondary repol abnrm Inferior infarct, age indeterminate Confirmed by Noemi Chapel [phone]) on 01/04/2022 1:53:11 PM ? ?Radiology ?DG Chest Port 1 View ? ?Result Date: 01/04/2022 ?CLINICAL DATA:  Weakness for 1 week.  Possible sepsis. EXAM: PORTABLE CHEST 1 VIEW COMPARISON:  11/03/2021 and prior radiographs FINDINGS: UPPER limits normal heart size noted. Mild bibasilar opacities are noted, favor atelectasis. There is no evidence of pulmonary edema, suspicious pulmonary nodule/mass, pleural effusion, or pneumothorax. No acute bony abnormalities are identified. IMPRESSION: Mild bibasilar opacities, favor atelectasis. Electronically Signed   By: Margarette Canada M.D.   On: 01/04/2022 13:12   ? ?Procedures ?Procedures  ? ? ?Medications Ordered in ED ?Medications  ?sodium chloride 0.9 % bolus 1,000 mL (0 mLs Intravenous Stopped 01/04/22 1434)  ?ketorolac (TORADOL) 30 MG/ML injection 30 mg (30 mg Intravenous Given 01/04/22 1450)  ?metoCLOPramide (REGLAN) injection 10 mg (10 mg Intravenous Given 01/04/22  1452)  ?diphenhydrAMINE (BENADRYL) injection 25 mg (25 mg Intravenous Given 01/04/22 1452)  ?acetaminophen (TYLENOL) tablet 1,000 mg (1,000 mg Oral Given 01/04/22 1534)  ? ? ?ED Course/ Medical Decision Making/ A&P ?  ?

## 2022-01-04 NOTE — Sepsis Progress Note (Signed)
Sepsis protocol is being followed by [facility]. 

## 2022-01-04 NOTE — Discharge Instructions (Addendum)
You were seen in the emergency department for evaluation of weakness and possible fever possible urinary tract infection.  Your lab work was fairly unremarkable other than a possible urinary tract infection.  You were given fluids, antibiotics, and medications for headache which improved her symptoms.  Please drink plenty of fluids and rest.  Follow-up with your primary care doctor.  Return to the emergency department if any worsening or concerning symptoms. ?

## 2022-01-04 NOTE — ED Triage Notes (Signed)
Pt brought in by [facility] from home with c/o not feeling well for about the last 2 weeks. This past week she has been feeling worse, developed a fever, severe headache, right ear pain and right sided chest pain yesterday. Temp 101.1, HR 90, BP 137/74 for EMS. Pt also reports dysuria x several weeks.  ?

## 2022-01-06 ENCOUNTER — Observation Stay (HOSPITAL_COMMUNITY): Payer: Medicare PPO

## 2022-01-06 ENCOUNTER — Observation Stay (HOSPITAL_BASED_OUTPATIENT_CLINIC_OR_DEPARTMENT_OTHER)
Admission: EM | Admit: 2022-01-06 | Discharge: 2022-01-07 | Disposition: A | Discharge status: Home / Self Care | Payer: Medicare PPO | Attending: Emergency Medicine | Admitting: Emergency Medicine

## 2022-01-06 ENCOUNTER — Other Ambulatory Visit: Payer: Self-pay

## 2022-01-06 ENCOUNTER — Emergency Department (HOSPITAL_BASED_OUTPATIENT_CLINIC_OR_DEPARTMENT_OTHER): Payer: Medicare PPO

## 2022-01-06 ENCOUNTER — Encounter (HOSPITAL_BASED_OUTPATIENT_CLINIC_OR_DEPARTMENT_OTHER): Payer: Self-pay | Admitting: Emergency Medicine

## 2022-01-06 DIAGNOSIS — F419 Anxiety disorder, unspecified: Secondary | ICD-10-CM | POA: Diagnosis not present

## 2022-01-06 DIAGNOSIS — G8929 Other chronic pain: Secondary | ICD-10-CM | POA: Insufficient documentation

## 2022-01-06 DIAGNOSIS — M549 Dorsalgia, unspecified: Secondary | ICD-10-CM | POA: Insufficient documentation

## 2022-01-06 DIAGNOSIS — R42 Dizziness and giddiness: Principal | ICD-10-CM | POA: Insufficient documentation

## 2022-01-06 DIAGNOSIS — E559 Vitamin D deficiency, unspecified: Secondary | ICD-10-CM | POA: Insufficient documentation

## 2022-01-06 DIAGNOSIS — R2689 Other abnormalities of gait and mobility: Secondary | ICD-10-CM | POA: Insufficient documentation

## 2022-01-06 DIAGNOSIS — Z7985 Long-term (current) use of injectable non-insulin antidiabetic drugs: Secondary | ICD-10-CM | POA: Diagnosis not present

## 2022-01-06 DIAGNOSIS — R5383 Other fatigue: Secondary | ICD-10-CM | POA: Insufficient documentation

## 2022-01-06 DIAGNOSIS — E538 Deficiency of other specified B group vitamins: Secondary | ICD-10-CM | POA: Insufficient documentation

## 2022-01-06 DIAGNOSIS — Z20822 Contact with and (suspected) exposure to covid-19: Secondary | ICD-10-CM | POA: Diagnosis not present

## 2022-01-06 DIAGNOSIS — R519 Headache, unspecified: Secondary | ICD-10-CM

## 2022-01-06 DIAGNOSIS — E876 Hypokalemia: Secondary | ICD-10-CM | POA: Insufficient documentation

## 2022-01-06 DIAGNOSIS — M48061 Spinal stenosis, lumbar region without neurogenic claudication: Secondary | ICD-10-CM | POA: Diagnosis not present

## 2022-01-06 DIAGNOSIS — Z79899 Other long term (current) drug therapy: Secondary | ICD-10-CM | POA: Insufficient documentation

## 2022-01-06 DIAGNOSIS — I671 Cerebral aneurysm, nonruptured: Secondary | ICD-10-CM | POA: Diagnosis not present

## 2022-01-06 DIAGNOSIS — R110 Nausea: Secondary | ICD-10-CM | POA: Diagnosis not present

## 2022-01-06 DIAGNOSIS — Z85828 Personal history of other malignant neoplasm of skin: Secondary | ICD-10-CM | POA: Insufficient documentation

## 2022-01-06 DIAGNOSIS — I6782 Cerebral ischemia: Secondary | ICD-10-CM | POA: Insufficient documentation

## 2022-01-06 DIAGNOSIS — R9431 Abnormal electrocardiogram [ECG] [EKG]: Secondary | ICD-10-CM | POA: Diagnosis not present

## 2022-01-06 DIAGNOSIS — Z79890 Hormone replacement therapy (postmenopausal): Secondary | ICD-10-CM | POA: Insufficient documentation

## 2022-01-06 DIAGNOSIS — E038 Other specified hypothyroidism: Secondary | ICD-10-CM | POA: Diagnosis not present

## 2022-01-06 DIAGNOSIS — E063 Autoimmune thyroiditis: Secondary | ICD-10-CM | POA: Insufficient documentation

## 2022-01-06 DIAGNOSIS — M2550 Pain in unspecified joint: Secondary | ICD-10-CM | POA: Insufficient documentation

## 2022-01-06 DIAGNOSIS — R0602 Shortness of breath: Secondary | ICD-10-CM | POA: Diagnosis not present

## 2022-01-06 DIAGNOSIS — M8580 Other specified disorders of bone density and structure, unspecified site: Secondary | ICD-10-CM | POA: Insufficient documentation

## 2022-01-06 DIAGNOSIS — J9811 Atelectasis: Secondary | ICD-10-CM | POA: Diagnosis not present

## 2022-01-06 DIAGNOSIS — R262 Difficulty in walking, not elsewhere classified: Secondary | ICD-10-CM | POA: Insufficient documentation

## 2022-01-06 DIAGNOSIS — R112 Nausea with vomiting, unspecified: Secondary | ICD-10-CM | POA: Insufficient documentation

## 2022-01-06 DIAGNOSIS — M5481 Occipital neuralgia: Secondary | ICD-10-CM | POA: Insufficient documentation

## 2022-01-06 DIAGNOSIS — G43719 Chronic migraine without aura, intractable, without status migrainosus: Secondary | ICD-10-CM | POA: Diagnosis not present

## 2022-01-06 DIAGNOSIS — R748 Abnormal levels of other serum enzymes: Secondary | ICD-10-CM | POA: Diagnosis not present

## 2022-01-06 DIAGNOSIS — Z8349 Family history of other endocrine, nutritional and metabolic diseases: Secondary | ICD-10-CM | POA: Insufficient documentation

## 2022-01-06 DIAGNOSIS — Z87440 Personal history of urinary (tract) infections: Secondary | ICD-10-CM | POA: Diagnosis not present

## 2022-01-06 DIAGNOSIS — Z66 Do not resuscitate: Secondary | ICD-10-CM | POA: Diagnosis not present

## 2022-01-06 DIAGNOSIS — R531 Weakness: Secondary | ICD-10-CM | POA: Diagnosis not present

## 2022-01-06 DIAGNOSIS — K219 Gastro-esophageal reflux disease without esophagitis: Secondary | ICD-10-CM | POA: Insufficient documentation

## 2022-01-06 DIAGNOSIS — F32A Depression, unspecified: Secondary | ICD-10-CM | POA: Diagnosis not present

## 2022-01-06 DIAGNOSIS — H9201 Otalgia, right ear: Secondary | ICD-10-CM | POA: Diagnosis not present

## 2022-01-06 DIAGNOSIS — E039 Hypothyroidism, unspecified: Secondary | ICD-10-CM | POA: Diagnosis not present

## 2022-01-06 DIAGNOSIS — I6523 Occlusion and stenosis of bilateral carotid arteries: Secondary | ICD-10-CM | POA: Diagnosis not present

## 2022-01-06 DIAGNOSIS — E785 Hyperlipidemia, unspecified: Secondary | ICD-10-CM | POA: Insufficient documentation

## 2022-01-06 HISTORY — DX: Headache, unspecified: R51.9

## 2022-01-06 LAB — RESP PANEL BY RT-PCR (FLU A&B, COVID) ARPGX2
Influenza A by PCR: NEGATIVE
Influenza B by PCR: NEGATIVE
SARS Coronavirus 2 by RT PCR: NEGATIVE

## 2022-01-06 LAB — CBC WITH DIFFERENTIAL/PLATELET
Abs Immature Granulocytes: 0.03 10*3/uL (ref 0.00–0.07)
Basophils Absolute: 0 10*3/uL (ref 0.0–0.1)
Basophils Relative: 1 %
Eosinophils Absolute: 0.1 10*3/uL (ref 0.0–0.5)
Eosinophils Relative: 2 %
HCT: 37.7 % (ref 36.0–46.0)
Hemoglobin: 12.4 g/dL (ref 12.0–15.0)
Immature Granulocytes: 1 %
Lymphocytes Relative: 13 %
Lymphs Abs: 0.6 10*3/uL — ABNORMAL LOW (ref 0.7–4.0)
MCH: 29.7 pg (ref 26.0–34.0)
MCHC: 32.9 g/dL (ref 30.0–36.0)
MCV: 90.2 fL (ref 80.0–100.0)
Monocytes Absolute: 0.6 10*3/uL (ref 0.1–1.0)
Monocytes Relative: 12 %
Neutro Abs: 3.4 10*3/uL (ref 1.7–7.7)
Neutrophils Relative %: 71 %
Platelets: 167 10*3/uL (ref 150–400)
RBC: 4.18 MIL/uL (ref 3.87–5.11)
RDW: 13.7 % (ref 11.5–15.5)
WBC: 4.7 10*3/uL (ref 4.0–10.5)
nRBC: 0 % (ref 0.0–0.2)

## 2022-01-06 LAB — URINALYSIS, ROUTINE W REFLEX MICROSCOPIC
Bilirubin Urine: NEGATIVE
Glucose, UA: NEGATIVE mg/dL
Ketones, ur: 15 mg/dL — AB
Leukocytes,Ua: NEGATIVE
Nitrite: NEGATIVE
Protein, ur: NEGATIVE mg/dL
Specific Gravity, Urine: 1.015 (ref 1.005–1.030)
pH: 7 (ref 5.0–8.0)

## 2022-01-06 LAB — COMPREHENSIVE METABOLIC PANEL
ALT: 24 U/L (ref 0–44)
AST: 25 U/L (ref 15–41)
Albumin: 3.1 g/dL — ABNORMAL LOW (ref 3.5–5.0)
Alkaline Phosphatase: 194 U/L — ABNORMAL HIGH (ref 38–126)
Anion gap: 9 (ref 5–15)
BUN: 9 mg/dL (ref 8–23)
CO2: 24 mmol/L (ref 22–32)
Calcium: 8.9 mg/dL (ref 8.9–10.3)
Chloride: 103 mmol/L (ref 98–111)
Creatinine, Ser: 0.99 mg/dL (ref 0.44–1.00)
GFR, Estimated: 60 mL/min — ABNORMAL LOW (ref >60)
Glucose, Bld: 112 mg/dL — ABNORMAL HIGH (ref 70–99)
Potassium: 3.2 mmol/L — ABNORMAL LOW (ref 3.5–5.1)
Sodium: 136 mmol/L (ref 135–145)
Total Bilirubin: 1.3 mg/dL — ABNORMAL HIGH (ref 0.3–1.2)
Total Protein: 6.5 g/dL (ref 6.5–8.1)

## 2022-01-06 LAB — URINE CULTURE: Culture: NO GROWTH

## 2022-01-06 LAB — URINALYSIS, MICROSCOPIC (REFLEX)

## 2022-01-06 LAB — TROPONIN I (HIGH SENSITIVITY)
Troponin I (High Sensitivity): 5 ng/L (ref <18)
Troponin I (High Sensitivity): 4 ng/L (ref <18)

## 2022-01-06 LAB — CBG MONITORING, ED: Glucose-Capillary: 95 mg/dL (ref 70–99)

## 2022-01-06 MED ORDER — RIZATRIPTAN BENZOATE 10 MG PO TBDP: 10.0000 mg | ORAL_TABLET | ORAL | Status: DC | PRN

## 2022-01-06 MED ORDER — ALBUTEROL SULFATE (2.5 MG/3ML) 0.083% IN NEBU: 2.5000 mg | INHALATION_SOLUTION | Freq: Four times a day (QID) | RESPIRATORY_TRACT | Status: DC | PRN

## 2022-01-06 MED ORDER — GABAPENTIN 300 MG PO CAPS
300.0000 mg | ORAL_CAPSULE | Freq: Every day | ORAL | Status: DC
  Administered 2022-01-06: 300 mg via ORAL
  Filled 2022-01-06: qty 1

## 2022-01-06 MED ORDER — IOHEXOL 350 MG/ML SOLN
100.0000 mL | Freq: Once | INTRAVENOUS | Status: AC | PRN
Start: 2022-01-06 — End: 2022-01-06
  Administered 2022-01-06: 75 mL via INTRAVENOUS

## 2022-01-06 MED ORDER — LEVOTHYROXINE SODIUM 75 MCG PO TABS
75.0000 ug | ORAL_TABLET | Freq: Every day | ORAL | Status: DC
  Administered 2022-01-07: 75 ug via ORAL
  Filled 2022-01-06: qty 1

## 2022-01-06 MED ORDER — FAMOTIDINE 20 MG PO TABS: 20.0000 mg | ORAL_TABLET | Freq: Every day | ORAL | Status: DC

## 2022-01-06 MED ORDER — ALPRAZOLAM 0.5 MG PO TABS: 0.5000 mg | ORAL_TABLET | Freq: Every evening | ORAL | Status: DC | PRN

## 2022-01-06 MED ORDER — ALPRAZOLAM 0.5 MG PO TABS
0.5000 mg | ORAL_TABLET | Freq: Three times a day (TID) | ORAL | Status: DC | PRN
  Administered 2022-01-06: 0.5 mg via ORAL
  Filled 2022-01-06: qty 1

## 2022-01-06 MED ORDER — SODIUM CHLORIDE 0.9% FLUSH
3.0000 mL | Freq: Two times a day (BID) | INTRAVENOUS | Status: DC
  Administered 2022-01-06: 3 mL via INTRAVENOUS

## 2022-01-06 MED ORDER — VENLAFAXINE HCL ER 75 MG PO CP24
75.0000 mg | ORAL_CAPSULE | Freq: Every day | ORAL | Status: DC
  Administered 2022-01-07: 75 mg via ORAL
  Filled 2022-01-06: qty 1

## 2022-01-06 MED ORDER — DOCUSATE SODIUM 100 MG PO CAPS
100.0000 mg | ORAL_CAPSULE | Freq: Two times a day (BID) | ORAL | Status: DC
  Administered 2022-01-07: 100 mg via ORAL
  Filled 2022-01-06 (×2): qty 1

## 2022-01-06 MED ORDER — SODIUM CHLORIDE 0.9% FLUSH: 3.0000 mL | INTRAVENOUS | Status: DC | PRN

## 2022-01-06 MED ORDER — GADOBUTROL 1 MMOL/ML IV SOLN
8.0000 mL | Freq: Once | INTRAVENOUS | Status: AC | PRN
  Administered 2022-01-06: 8 mL via INTRAVENOUS

## 2022-01-06 MED ORDER — METOCLOPRAMIDE HCL 5 MG/ML IJ SOLN
10.0000 mg | Freq: Once | INTRAMUSCULAR | Status: AC
  Administered 2022-01-06: 10 mg via INTRAVENOUS
  Filled 2022-01-06: qty 2

## 2022-01-06 MED ORDER — PROCHLORPERAZINE EDISYLATE 10 MG/2ML IJ SOLN
5.0000 mg | Freq: Once | INTRAMUSCULAR | Status: AC
  Administered 2022-01-06: 5 mg via INTRAVENOUS
  Filled 2022-01-06: qty 2

## 2022-01-06 MED ORDER — ONDANSETRON HCL 4 MG PO TABS
4.0000 mg | ORAL_TABLET | Freq: Three times a day (TID) | ORAL | Status: DC | PRN
  Administered 2022-01-07: 4 mg via ORAL
  Filled 2022-01-06: qty 1

## 2022-01-06 MED ORDER — ACETAMINOPHEN 500 MG PO TABS
1000.0000 mg | ORAL_TABLET | Freq: Once | ORAL | Status: AC
  Administered 2022-01-06: 1000 mg via ORAL
  Filled 2022-01-06: qty 2

## 2022-01-06 MED ORDER — HYDRALAZINE HCL 20 MG/ML IJ SOLN
10.0000 mg | Freq: Three times a day (TID) | INTRAMUSCULAR | Status: DC | PRN
  Administered 2022-01-07: 10 mg via INTRAVENOUS
  Filled 2022-01-06: qty 1

## 2022-01-06 MED ORDER — ACETAMINOPHEN 325 MG PO TABS
650.0000 mg | ORAL_TABLET | Freq: Four times a day (QID) | ORAL | Status: DC | PRN
  Administered 2022-01-06: 650 mg via ORAL
  Filled 2022-01-06: qty 2

## 2022-01-06 MED ORDER — ENOXAPARIN SODIUM 40 MG/0.4ML IJ SOSY
40.0000 mg | PREFILLED_SYRINGE | INTRAMUSCULAR | Status: DC
  Administered 2022-01-06: 40 mg via SUBCUTANEOUS
  Filled 2022-01-06: qty 0.4

## 2022-01-06 MED ORDER — DIPHENHYDRAMINE HCL 50 MG/ML IJ SOLN
12.5000 mg | Freq: Once | INTRAMUSCULAR | Status: AC
Start: 2022-01-06 — End: 2022-01-06
  Administered 2022-01-06: 12.5 mg via INTRAVENOUS
  Filled 2022-01-06: qty 1

## 2022-01-06 MED ORDER — ALBUTEROL SULFATE HFA 108 (90 BASE) MCG/ACT IN AERS: 2.0000 | INHALATION_SPRAY | Freq: Four times a day (QID) | RESPIRATORY_TRACT | Status: DC | PRN

## 2022-01-06 MED ORDER — SODIUM CHLORIDE 0.9 % IV SOLN: 250.0000 mL | INTRAVENOUS | Status: DC | PRN

## 2022-01-06 MED ORDER — SUMATRIPTAN SUCCINATE 50 MG PO TABS
50.0000 mg | ORAL_TABLET | ORAL | Status: DC | PRN
  Administered 2022-01-07: 50 mg via ORAL
  Filled 2022-01-06 (×2): qty 1

## 2022-01-06 NOTE — ED Provider Notes (Signed)
?[facility]PARTMENT ?Provider Note ? ? ?CSN: [id number] ?Arrival date & time: 01/06/22  0620 ? ?  ? ?History ? ?Chief Complaint  ?Patient presents with  ? Headache  ? Weakness  ? ? ?[name] Nolan is a 76 y.o. female.  Presented to the ER due to concern for severe headache.  Patient states that since Friday she has been having extremely bad headache, seems to come and go, primarily on the right side of her head behind her right ear.  She was diagnosed with ear infection and given antibiotics and treated for migraine on Friday at [facility].  Over the weekend she also has had difficulty walking.  Has had bad nausea, vomiting.  Denies any chest pain or difficulty in breathing.  Has tried multiple different medications at home including oxycodone which did not seem to help. ? ?HPI ? ?  ? ?Home Medications ?Prior to Admission medications   ?Medication Sig Start Date End Date Taking? Authorizing Provider  ?acetaminophen (TYLENOL) 325 MG tablet Take 2 tablets (650 mg total) by mouth every 6 (six) hours as needed for mild pain (or Fever >/= 101). 11/05/21  Yes Nolberto Hanlon, MD  ?albuterol (VENTOLIN HFA) 108 (90 Base) MCG/ACT inhaler Inhale 2 puffs into the lungs every 6 (six) hours as needed for wheezing or shortness of breath. 11/05/21  Yes Nolberto Hanlon, MD  ?ALPRAZolam Duanne Moron) 0.5 MG tablet Take 1 tablet (0.5 mg total) by mouth at bedtime as needed for anxiety. 01/31/21  Yes Mosie Lukes, MD  ?botulinum toxin Type A (BOTOX) 100 units SOLR injection INJECT 155 UNITS  INTRAMUSCULARLY EVERY 3  MONTHS (GIVEN AT MD OFFICE, DISCARD UNUSED AFTER 1ST  USE) ?Patient taking differently: Inject 155 Units into the muscle every 3 (three) months. 09/06/19  Yes Melvenia Beam, MD  ?gabapentin (NEURONTIN) 300 MG capsule TAKE ONE TO THREE CAPSULES ('300MG'$ -'900MG'$ ) AT BEDTIME ?Patient taking differently: Take 300 mg by mouth 3 (three) times daily. 11/05/21  Yes Nolberto Hanlon, MD  ?ondansetron (ZOFRAN) 4 MG  tablet Take 1 tablet (4 mg total) by mouth every 8 (eight) hours as needed for nausea or vomiting. 11/15/21  Yes Early, Coralee Pesa, NP  ?rizatriptan (MAXALT-MLT) 10 MG disintegrating tablet Take 1 tablet (10 mg total) by mouth as needed for migraine. May repeat in 2 hours if needed 12/20/18  Yes Melvenia Beam, MD  ?SYNTHROID 75 MCG tablet Take 75 mcg by mouth daily before breakfast. 02/15/20  Yes Mosie Lukes, MD  ?venlafaxine XR (EFFEXOR-XR) 75 MG 24 hr capsule Take 1 capsule (75 mg total) by mouth daily with breakfast. 09/03/21  Yes Melvenia Beam, MD  ?cephALEXin (KEFLEX) 500 MG capsule Take 1 capsule (500 mg total) by mouth 4 (four) times daily. ?Patient not taking: Reported on 01/06/2022 01/04/22   Hayden Rasmussen, MD  ?famotidine (PEPCID) 20 MG tablet Take 1 tablet (20 mg total) by mouth daily for 7 days. ?Patient not taking: Reported on 01/04/2022 11/05/21 11/12/21  Nolberto Hanlon, MD  ?Semaglutide,0.25 or 0.'5MG'$ /DOS, (OZEMPIC, 0.25 OR 0.5 MG/DOSE,) 2 MG/1.5ML SOPN Inject 0.5 mg as directed once a week. ?Patient not taking: Reported on 01/04/2022 12/12/21 01/11/22  Orma Render, NP  ?   ? ?Allergies    ?Statins and Codeine   ? ?Review of Systems   ?Review of Systems  ?Constitutional:  Positive for fatigue. Negative for chills and fever.  ?HENT:  Negative for ear pain and sore throat.   ?Eyes:  Negative for  pain and visual disturbance.  ?Respiratory:  Negative for cough and shortness of breath.   ?Cardiovascular:  Negative for chest pain and palpitations.  ?Gastrointestinal:  Negative for abdominal pain and vomiting.  ?Genitourinary:  Negative for dysuria and hematuria.  ?Musculoskeletal:  Negative for arthralgias and back pain.  ?Skin:  Negative for color change and rash.  ?Neurological:  Negative for seizures and syncope.  ?All other systems reviewed and are negative. ? ?Physical Exam ?Updated Vital Signs ?BP (!) 148/77 (BP Location: Left Arm)   Pulse 63   Temp 98.2 ?F (36.8 ?C) (Oral)   Resp 18   Ht '5\' 5"'$  (1.651  m)   Wt 79.8 kg   SpO2 100%   BMI 29.29 kg/m?  ?Physical Exam ? ?ED Results / Procedures / Treatments   ?Labs ?(all labs ordered are listed, but only abnormal results are displayed) ?Labs Reviewed  ?CBC WITH DIFFERENTIAL/PLATELET - Abnormal; Notable for the following components:  ?    Result Value  ? Lymphs Abs 0.6 (*)   ? All other components within normal limits  ?COMPREHENSIVE METABOLIC PANEL - Abnormal; Notable for the following components:  ? Potassium 3.2 (*)   ? Glucose, Bld 112 (*)   ? Albumin 3.1 (*)   ? Alkaline Phosphatase 194 (*)   ? Total Bilirubin 1.3 (*)   ? GFR, Estimated 60 (*)   ? All other components within normal limits  ?URINALYSIS, ROUTINE W REFLEX MICROSCOPIC - Abnormal; Notable for the following components:  ? Hgb urine dipstick SMALL (*)   ? Ketones, ur 15 (*)   ? All other components within normal limits  ?URINALYSIS, MICROSCOPIC (REFLEX) - Abnormal; Notable for the following components:  ? Bacteria, UA RARE (*)   ? All other components within normal limits  ?TSH - Abnormal; Notable for the following components:  ? TSH 0.277 (*)   ? All other components within normal limits  ?BASIC METABOLIC PANEL - Abnormal; Notable for the following components:  ? Potassium 2.8 (*)   ? BUN 6 (*)   ? All other components within normal limits  ?CBC - Abnormal; Notable for the following components:  ? HCT 35.8 (*)   ? All other components within normal limits  ?VITAMIN B12 - Abnormal; Notable for the following components:  ? Vitamin B-12 1,453 (*)   ? All other components within normal limits  ?RESP PANEL BY RT-PCR (FLU A&B, COVID) ARPGX2  ?HEMOGLOBIN A1C  ?VITAMIN D 25 HYDROXY (VIT D DEFICIENCY, FRACTURES)  ?CBG MONITORING, ED  ?TROPONIN I (HIGH SENSITIVITY)  ?TROPONIN I (HIGH SENSITIVITY)  ? ? ?EKG ?EKG Interpretation ? ?Date/Time:  Monday January 06 2022 06:40:31 EDT ?Ventricular Rate:  62 ?PR Interval:  147 ?QRS Duration: 111 ?QT Interval:  400 ?QTC Calculation: 407 ?R Axis:   -13 ?Text  Interpretation: Sinus rhythm Abnormal R-wave progression, early transition Borderline T abnormalities, diffuse leads Confirmed by Madalyn Rob [phone]) on 01/06/2022 6:55:07 AM ? ?Radiology ?CT ANGIO HEAD NECK W WO CM ? ?Result Date: 01/06/2022 ?CLINICAL DATA:  Cerebral aneurysm screening, high risk. Nausea with pain and head behind the right ear. Difficulty ambulating.2 EXAM: CT ANGIOGRAPHY HEAD AND NECK TECHNIQUE: Multidetector CT imaging of the head and neck was performed using the standard protocol during bolus administration of intravenous contrast. Multiplanar CT image reconstructions and MIPs were obtained to evaluate the vascular anatomy. Carotid stenosis measurements (when applicable) are obtained utilizing NASCET criteria, using the distal internal carotid diameter as the denominator. RADIATION DOSE REDUCTION: This  exam was performed according to the departmental dose-optimization program which includes automated exposure control, adjustment of the mA and/or kV according to patient size and/or use of iterative reconstruction technique. CONTRAST:  52m OMNIPAQUE IOHEXOL 350 MG/ML SOLN COMPARISON:  Brain MRI 03/23/2015 FINDINGS: CT HEAD FINDINGS Brain: No evidence of acute infarction, hemorrhage, hydrocephalus, extra-axial collection or mass lesion/mass effect. Patchy low-density in the cerebral white matter attributed to chronic small vessel ischemia. Vascular: See below Skull: Normal. Negative for fracture or focal lesion. Sinuses: Clear Orbits: Bilateral cataract resection. Review of the MIP images confirms the above findings CTA NECK FINDINGS Aortic arch: Negative with 3 vessel branching. Right carotid system: No significant atheromatous changes. No dissection or stenosis Left carotid system: No significant atheromatous changes, especially for age. No stenosis or beading. Vertebral arteries: No proximal subclavian stenosis. Left dominant vertebral artery. The vertebral arteries are smoothly contoured  and widely patent to the dura. Skeleton: Ordinary cervical spine degeneration. Other neck: No acute finding.  No visible inflammation. Upper chest: Negative Review of the MIP images confirms the above findings CTA HEAD FINDINGS An

## 2022-01-06 NOTE — ED Triage Notes (Signed)
Pt c/o nausea, "excruciating pain" in head behind R ear. Pt with difficulty articulating her concerns, keeps her eyes closed while speaking. Daughter reports difficulty with ambulation since Friday. Hx of migraines but pt states this is not like her usual migraine. EDP called to bedside. (Triage completed after exam, VS, IV placement, EKG). ?

## 2022-01-06 NOTE — H&P (Signed)
?History and Physical  ? ? ?Patient: [name] Nolan EPP:[id number] DOB: [birth year] ?DOA: 01/06/2022 ?DOS: the patient was seen and examined on 01/06/2022 ?PCP: Orma Render, NP  ?Patient coming from: Home ? ?Chief Complaint:  ?Chief Complaint  ?Patient presents with  ? Headache  ? Weakness  ? ?HPI: Patient is a 76 years old female with past medical history of migraines, presented to the hospital with severe headache, off balance and difficulty ambulating.  Patient reported that since Friday she has been having bad headache, mostly on the right side mostly on the backside of the ear.  Patient's describes it as very sharp severe intensity pain radiating towards the upper head.  Denies any vomiting but has mild nausea.  Denies blurry vision.  Patient was recently treated for UTI with antibiotic and was treated for migraine on Friday at [facility].  She also reported difficulty ambulation since Friday.  No mention of  vomiting chest pain shortness of breath fever or chills.  Denies urinary urgency, frequency or dysuria at this time.  Patient tried oxycodone and other medications at home without much help so decided to come to the hospital for further evaluation and treatment.  Patient states that she takes bottle and toxin every 3 months for migraine headache.  ? ?In the ED, urinalysis showed rare bacteria.  Troponins were negative.  COVID and influenza was negative.  Patient had mild hypokalemia at 3.2.  Bilirubin was elevated at 1.3.  CBC was unremarkable.  Patient was then sent to the hospital for further evaluation and observation. ? ?Review of Systems: As mentioned in the history of present illness. All other systems reviewed and are negative. ?Past Medical History:  ?Diagnosis Date  ? Absolute anemia 11/17/2017  ? Acute hyperglycemia 11/04/2021  ? Acute non-recurrent pansinusitis 09/26/2021  ? Anxiety and depression 05/07/2014  ? Widowed in 2013 after caring for her husband with Lewy Body Dementia for 6 years    ? Basal cell carcinoma of right ear 02/26/2015  ? Removed by Dr Syble Creek  ? Chronic back pain   ? "mid-back; stops at the very lowest part of my back" (11/07/2015)  ? Dysphagia, pharyngoesophageal phase 12/13/2014  ? Esophageal reflux   ? occ  ? Excessive daytime sleepiness 01/25/2015  ? Hashimoto's disease   ? Heart murmur   ? Hot flashes 05/27/2016  ? Hyperlipidemia   ? Hypokalemia 11/04/2021  ? Hypothyroid   ? Insomnia   ? Joint pain   ? Low ferritin 08/27/2015  ? "took supplements for awhile" (11/07/2015)  ? Migraine   ? "under control w/daily RX right now" (11/07/2015)  ? Occipital neuralgia   ? Osteopenia 02/26/2015  ? Pneumonia   ? PONV (postoperative nausea and vomiting) 1974  ? after cholecystectomy  ? Prolonged QT interval 11/04/2021  ? Skin cancer 02/26/2015  ? Right ear Removed by Dr Syble Creek  ? Vitamin B12 deficiency 09/01/2016  ? Vitamin D deficiency 03/02/2017  ? ?Past Surgical History:  ?Procedure Laterality Date  ? APPENDECTOMY  11/07/2015  ? BASAL CELL CARCINOMA EXCISION Right 02/26/2015  ? ear  ? CATARACT EXTRACTION Bilateral   ? CHOLECYSTECTOMY OPEN  09/15/1972  ? COLONOSCOPY  09/15/2004  ? LAPAROSCOPIC APPENDECTOMY N/A 11/07/2015  ? Procedure: APPENDECTOMY LAPAROSCOPIC;  Surgeon: Georganna Skeans, MD;  Location: [facility];  Service: General;  Laterality: N/A;  ? LAPAROSCOPIC INCISIONAL / UMBILICAL / [place]  11/07/2015  ? UHR  ? LUMBAR LAMINECTOMY/DECOMPRESSION MICRODISCECTOMY N/A 03/21/2021  ? Procedure: Microlumbar decompression Lumbar  four-five [place];  Surgeon: Susa Day, MD;  Location: [facility];  Service: Orthopedics;  Laterality: N/A;  ? Tooth implant    ? at least 5 years ago per pt  ? TUBAL LIGATION  09/16/1971  ? UMBILICAL HERNIA REPAIR N/A 11/07/2015  ? Procedure: LAPAROSCOPIC UMBILICAL HERNIA;  Surgeon: Georganna Skeans, MD;  Location: [facility];  Service: General;  Laterality: N/A;  ? ?Social History:  reports that she has never smoked. She has never used smokeless tobacco. She reports  that she does not drink alcohol and does not use drugs. ? ?Allergies  ?Allergen Reactions  ? Statins Other (See Comments)  ?  Muscle pain  ? Codeine Nausea And Vomiting  ? ? ?Family History  ?Problem Relation Age of Onset  ? Congestive Heart Failure Mother   ? Hypertension Mother   ? Hyperlipidemia Mother   ? Heart disease Mother   ? Obesity Mother   ? Leukemia Father   ? Obesity Father   ? Kidney disease Brother   ? Cancer Brother   ?     stage 4 kidney cancer, metastatic  ? Kidney cancer Brother   ? Leukemia Maternal Aunt   ? Congestive Heart Failure Maternal Grandmother   ? Arthritis Sister   ? Colon cancer Neg Hx   ? ? ?Prior to Admission medications   ?Medication Sig Start Date End Date Taking? Authorizing Provider  ?acetaminophen (TYLENOL) 325 MG tablet Take 2 tablets (650 mg total) by mouth every 6 (six) hours as needed for mild pain (or Fever >/= 101). 11/05/21   Nolberto Hanlon, MD  ?albuterol (VENTOLIN HFA) 108 (90 Base) MCG/ACT inhaler Inhale 2 puffs into the lungs every 6 (six) hours as needed for wheezing or shortness of breath. 11/05/21   Nolberto Hanlon, MD  ?ALPRAZolam Duanne Moron) 0.5 MG tablet Take 1 tablet (0.5 mg total) by mouth at bedtime as needed for anxiety. 01/31/21   Mosie Lukes, MD  ?botulinum toxin Type A (BOTOX) 100 units SOLR injection INJECT 155 UNITS  INTRAMUSCULARLY EVERY 3  MONTHS (GIVEN AT MD OFFICE, DISCARD UNUSED AFTER 1ST  USE) 09/06/19   Melvenia Beam, MD  ?cephALEXin (KEFLEX) 500 MG capsule Take 1 capsule (500 mg total) by mouth 4 (four) times daily. 01/04/22   Hayden Rasmussen, MD  ?famotidine (PEPCID) 20 MG tablet Take 1 tablet (20 mg total) by mouth daily for 7 days. ?Patient not taking: Reported on 01/04/2022 11/05/21 11/12/21  Nolberto Hanlon, MD  ?gabapentin (NEURONTIN) 300 MG capsule TAKE ONE TO THREE CAPSULES ('300MG'$ -'900MG'$ ) AT BEDTIME 11/05/21   Nolberto Hanlon, MD  ?ondansetron (ZOFRAN) 4 MG tablet Take 1 tablet (4 mg total) by mouth every 8 (eight) hours as needed for nausea or  vomiting. 11/15/21   Orma Render, NP  ?rizatriptan (MAXALT-MLT) 10 MG disintegrating tablet Take 1 tablet (10 mg total) by mouth as needed for migraine. May repeat in 2 hours if needed 12/20/18   Melvenia Beam, MD  ?Semaglutide,0.25 or 0.'5MG'$ /DOS, (OZEMPIC, 0.25 OR 0.5 MG/DOSE,) 2 MG/1.5ML SOPN Inject 0.5 mg as directed once a week. ?Patient not taking: Reported on 01/04/2022 12/12/21 01/11/22  Orma Render, NP  ?SYNTHROID 75 MCG tablet Take 75 mcg by mouth daily before breakfast. 02/15/20   Mosie Lukes, MD  ?venlafaxine XR (EFFEXOR-XR) 75 MG 24 hr capsule Take 1 capsule (75 mg total) by mouth daily with breakfast. 09/03/21   Melvenia Beam, MD  ? ? ?Physical Exam: ?Vitals:  ? 01/06/22 1400 01/06/22  1444 01/06/22 1500 01/06/22 1600  ?BP: 132/70  125/69 124/89  ?Pulse: 71  67 86  ?Resp: 15   (!) 22  ?Temp: 100.2 ?F (37.9 ?C) 98.8 ?F (37.1 ?C)    ?TempSrc:  Oral    ?SpO2: 100%  97% 100%  ?Weight:      ?Height:      ? ?Body mass index is 29.29 kg/m?.  ? ?General:  Average built, not in obvious distress, mildly anxious ?HENT:   No scleral pallor or icterus noted. Oral mucosa is moist.  ?Chest:  Clear breath sounds.  Diminished breath sounds bilaterally. No crackles or wheezes.  ?CVS: S1 &S2 heard. No murmur.  Regular rate and rhythm. ?Abdomen: Soft, nontender, nondistended.  Bowel sounds are heard.   ?Extremities: No cyanosis, clubbing or edema.  Peripheral pulses are palpable. ?Psych: Alert, awake and oriented, mildly anxious ?CNS:  No cranial nerve deficits.  Power equal in all extremities.   ?Skin: Warm and dry.  No rashes noted. ? ?Data Reviewed: ? ?  Latest Ref Rng & Units 01/06/2022  ?  7:06 AM 01/04/2022  ? 12:58 PM 11/05/2021  ?  4:13 AM  ?CBC  ?WBC 4.0 - 10.5 K/uL 4.7   6.7   7.9    ?Hemoglobin 12.0 - 15.0 g/dL 12.4   13.4   12.9    ?Hematocrit 36.0 - 46.0 % 37.7   41.1   39.9    ?Platelets 150 - 400 K/uL 167   164   210    ?  ? ?  Latest Ref Rng & Units 01/06/2022  ?  7:06 AM 01/04/2022  ? 12:58 PM 11/05/2021  ?   4:13 AM  ?BMP  ?Glucose 70 - 99 mg/dL 112   107   144    ?BUN 8 - 23 mg/dL '9   10   7    '$ ?Creatinine 0.44 - 1.00 mg/dL 0.99   0.97   0.94    ?Sodium 135 - 145 mmol/L 136   137   141    ?Potassium 3.5 - 5.1

## 2022-01-06 NOTE — Hospital Course (Addendum)
Patient is a 76 years old female with past medical history of migraines, presented to the hospital with severe headache, vertigo and difficulty ambulating.    Patient tried oxycodone and other medications at home without much help so decided to come to the hospital for further evaluation and treatment. In the ED, urinalysis showed rare bacteria.  Troponins were negative.  COVID and influenza was negative.  Patient had mild hypokalemia at 3.2.  Bilirubin was elevated at 1.3.  CBC was unremarkable.  Patient was then admitted to the hospital for further evaluation and observation. ? ?Assessment and plan. ? ?Principal Problem: ?  Vertigo ?Active Problems: ?  Hypothyroidism ?  Chronic migraine without aura, intractable, without status migrainosus ?  Spinal stenosis at L4-L5 level ?  Headache ?  Ambulatory dysfunction ?  ?Intractable retroauricular pain, headache.  History of migraine. ? Patient has been using botulinum toxin as outpatient and takes gabapentin as well history of migraines..  On rizatriptan Xanax and Effexor as outpatient which will be continued on discharge.  MRI of the brain and MRV brain was negative for acute findings including dural venous thrombosis. Add flexeril to the regimen for muscle relaxation.  I had a prolonged discussion with the patient as well as patient's daughter and sister about the further follow-up with neurology.  Possibility of occipital neuralgia or neuropathy.  Patient was advised to take gabapentin 3 times daily dosing in the interim. ? ?Vertigo/unsteadiness.    CT angiogram of the head and neck was negative for acute occlusion or dissection.   MRI of the brain/MRV negative for acute stroke or thrombosis.  PT evaluation was performed with no specific PT needs.  Vertigo prior to discharge. ? ?Hypokalemia.  Potassium of 2.8 today.  Patient received 40 mEq of p.o. potassium and has been prescribed for 5 more days of potassium on discharge. ? ?History of hyperlipidemia.  Not on  antilipid medication ? ?History of anxiety and depression. ?Continue Xanax and Effexor on discharge. ? ?Chronic back pain, spinal stenosis/ambulatory dysfunction. ?On gabapentin.  Has been changed to 3 times daily. ? ?Hypothyroidism.  Continue Synthroid. ? ?History of Vitamin B12 deficiency vitamin D deficiency. ?Vitamin B-12 at 1453.  Vitamin D level was 36 improved from last time.  TSH within normal limits.  Hemoglobin A1c at 5.3. ? ?

## 2022-01-07 ENCOUNTER — Telehealth: Payer: Self-pay | Admitting: Neurology

## 2022-01-07 DIAGNOSIS — E876 Hypokalemia: Secondary | ICD-10-CM | POA: Diagnosis not present

## 2022-01-07 DIAGNOSIS — R519 Headache, unspecified: Secondary | ICD-10-CM | POA: Diagnosis not present

## 2022-01-07 DIAGNOSIS — E038 Other specified hypothyroidism: Secondary | ICD-10-CM | POA: Diagnosis not present

## 2022-01-07 DIAGNOSIS — R42 Dizziness and giddiness: Secondary | ICD-10-CM | POA: Diagnosis not present

## 2022-01-07 DIAGNOSIS — R262 Difficulty in walking, not elsewhere classified: Secondary | ICD-10-CM | POA: Diagnosis not present

## 2022-01-07 DIAGNOSIS — G43719 Chronic migraine without aura, intractable, without status migrainosus: Secondary | ICD-10-CM | POA: Diagnosis not present

## 2022-01-07 DIAGNOSIS — M48061 Spinal stenosis, lumbar region without neurogenic claudication: Secondary | ICD-10-CM | POA: Diagnosis not present

## 2022-01-07 DIAGNOSIS — E063 Autoimmune thyroiditis: Secondary | ICD-10-CM | POA: Diagnosis not present

## 2022-01-07 LAB — BASIC METABOLIC PANEL
Anion gap: 9 (ref 5–15)
BUN: 6 mg/dL — ABNORMAL LOW (ref 8–23)
CO2: 24 mmol/L (ref 22–32)
Calcium: 8.9 mg/dL (ref 8.9–10.3)
Chloride: 107 mmol/L (ref 98–111)
Creatinine, Ser: 0.85 mg/dL (ref 0.44–1.00)
GFR, Estimated: >60 mL/min (ref >60)
Glucose, Bld: 97 mg/dL (ref 70–99)
Potassium: 2.8 mmol/L — ABNORMAL LOW (ref 3.5–5.1)
Sodium: 140 mmol/L (ref 135–145)

## 2022-01-07 LAB — CBC
HCT: 35.8 % — ABNORMAL LOW (ref 36.0–46.0)
Hemoglobin: 12.3 g/dL (ref 12.0–15.0)
MCH: 30 pg (ref 26.0–34.0)
MCHC: 34.4 g/dL (ref 30.0–36.0)
MCV: 87.3 fL (ref 80.0–100.0)
Platelets: 202 10*3/uL (ref 150–400)
RBC: 4.1 MIL/uL (ref 3.87–5.11)
RDW: 13.5 % (ref 11.5–15.5)
WBC: 4.7 10*3/uL (ref 4.0–10.5)
nRBC: 0 % (ref 0.0–0.2)

## 2022-01-07 LAB — HEMOGLOBIN A1C
Hgb A1c MFr Bld: 5 % (ref 4.8–5.6)
Mean Plasma Glucose: 96.8 mg/dL

## 2022-01-07 LAB — VITAMIN D 25 HYDROXY (VIT D DEFICIENCY, FRACTURES): Vit D, 25-Hydroxy: 36.19 ng/mL (ref 30–100)

## 2022-01-07 LAB — TSH: TSH: 0.277 u[IU]/mL — ABNORMAL LOW (ref 0.350–4.500)

## 2022-01-07 LAB — VITAMIN B12: Vitamin B-12: 1453 pg/mL — ABNORMAL HIGH (ref 180–914)

## 2022-01-07 MED ORDER — CYCLOBENZAPRINE HCL 5 MG PO TABS
5.0000 mg | ORAL_TABLET | Freq: Three times a day (TID) | ORAL | 0 refills | Status: DC | PRN
Start: 2022-01-07 — End: 2022-08-19

## 2022-01-07 MED ORDER — ONDANSETRON HCL 4 MG/2ML IJ SOLN
4.0000 mg | Freq: Four times a day (QID) | INTRAMUSCULAR | Status: DC | PRN
Start: 2022-01-07 — End: 2022-01-07

## 2022-01-07 MED ORDER — POTASSIUM CHLORIDE CRYS ER 20 MEQ PO TBCR: 20.0000 meq | EXTENDED_RELEASE_TABLET | Freq: Every day | ORAL | 0 refills | Status: DC

## 2022-01-07 MED ORDER — GABAPENTIN 300 MG PO CAPS: 300.0000 mg | ORAL_CAPSULE | Freq: Three times a day (TID) | ORAL | Status: DC

## 2022-01-07 MED ORDER — POTASSIUM CHLORIDE CRYS ER 20 MEQ PO TBCR
40.0000 meq | EXTENDED_RELEASE_TABLET | Freq: Once | ORAL | Status: AC
  Administered 2022-01-07: 40 meq via ORAL
  Filled 2022-01-07: qty 2

## 2022-01-07 NOTE — Telephone Encounter (Signed)
Pt's daughter called needing to speak to the RN regarding the pt's Hospital Visit. She was informed by the provider in the hospital to call and speak to RN. Please advise. ?

## 2022-01-07 NOTE — Evaluation (Signed)
Physical Therapy Evaluation ?Patient Details ?Name: [name] Nolan ?MRN: [id number] ?DOB: 1946-04-16 ?Today's Date: 01/07/2022 ? ?History of Present Illness ? Pt is 76 yo female admitted on 01/06/22 with vertigo, difficulty ambulating, intractable headache.  Pt with hx of migraines and R retroauricular pain that she receives botulinum toxin as outpt and gabapentin.  Pt with other hx including but not limited to anxiety and depression, basal cell CA R ear, Hasimoto's disease, hypokalemia, hyperlipidemia, joint pain, occipital neuralgia, osteopenia, lumbar decompression. ?  ?Clinical Impression ? Pt admitted with above diagnosis. At baseline pt lives alone and is independent.  At d/c she will have her sister stay with her and she has access to RW and rollator.  Today, pt overall limited by headache and nausea.  Vestibular/vertigo screening was negative.  She ambulated 20'x2 in room with mild unsteadiness that improved with use of RW.  Pt's limitations seemed to be related to severe headache and nausea - expect to progress well as these are managed and likely no therapy needs at discharge. While hospitalized will benefit from PT to progress.  Pt currently with functional limitations due to the deficits listed below (see PT Problem List). Pt will benefit from skilled PT to increase their independence and safety with mobility to allow discharge to the venue listed below.   ?   ?   ? ?Recommendations for follow up therapy are one component of a multi-disciplinary discharge planning process, led by the attending physician.  Recommendations may be updated based on patient status, additional functional criteria and insurance authorization. ? ?Follow Up Recommendations No PT follow up ? ?  ?Assistance Recommended at Discharge Intermittent Supervision/Assistance  ?Patient can return home with the following ? A little help with walking and/or transfers;A little help with bathing/dressing/bathroom;Assistance with  cooking/housework;Help with stairs or ramp for entrance ? ?  ?Equipment Recommendations None recommended by PT (has RW)  ?Recommendations for Other Services ?    ?  ?Functional Status Assessment Patient has had a recent decline in their functional status and demonstrates the ability to make significant improvements in function in a reasonable and predictable amount of time.  ? ?  ?Precautions / Restrictions Precautions ?Precautions: Fall ?Restrictions ?Weight Bearing Restrictions: No  ? ?  ? ?Mobility ? Bed Mobility ?Overal bed mobility: Needs Assistance ?Bed Mobility: Supine to Sit, Sit to Supine ?  ?  ?Supine to sit: Supervision ?Sit to supine: Supervision ?  ?  ?  ? ?Transfers ?Overall transfer level: Needs assistance ?Equipment used: 1 person hand held assist ?Transfers: Sit to/from Stand ?Sit to Stand: Min guard ?  ?  ?  ?  ?  ?General transfer comment: Needing UE support ?  ? ?Ambulation/Gait ?Ambulation/Gait assistance: Min guard ?Gait Distance (Feet): 20 Feet (20'x2) ?Assistive device: Rolling walker (2 wheels) ?Gait Pattern/deviations: Step-through pattern, Decreased stride length ?Gait velocity: decreased ?  ?  ?General Gait Details: mild unsteadiness improved with RW; distance limited due to headache and nausea (nursing gave Zofran) ? ?Stairs ?  ?  ?  ?  ?  ? ?Wheelchair Mobility ?  ? ?Modified Rankin (Stroke Patients Only) ?  ? ?  ? ?Balance Overall balance assessment: Needs assistance ?Sitting-balance support: No upper extremity supported ?Sitting balance-Leahy Scale: Good ?  ?  ?Standing balance support: Single extremity supported ?Standing balance-Leahy Scale: Poor ?Standing balance comment: Needing at least UE support ?  ?  ?  ?  ?  ?  ?  ?  ?  ?  ?  ?   ? ? ? ?  Pertinent Vitals/Pain Pain Assessment ?Pain Assessment: 0-10 ?Pain Score: 9  ?Pain Location: headache (R retroauricular pain) ?Pain Descriptors / Indicators: Grimacing, Throbbing ?Pain Intervention(s): Limited activity within patient's  tolerance, Monitored during session, Other (comment), Premedicated before session, Relaxation (RN premedicated, PT provided cool rag, turned temp down, lights off, tried cervical retractions)  ? ? ?Home Living Family/patient expects to be discharged to:: Private residence ?Living Arrangements: Alone ?Available Help at Discharge: Family;Available 24 hours/day (sisters are staying with her at d/c) ?Type of Home: House ?Home Access: Level entry ?  ?  ?  ?Home Layout: One level ?Home Equipment: Rollator (4 wheels) ?Additional Comments: Pt reporting her home is accessible as she has made updates for her husband who had dementia (now deceased)  ?  ?Prior Function Prior Level of Function : Independent/Modified Independent;Driving ?  ?  ?  ?  ?  ?  ?Mobility Comments: Reports could ambulate in community without AD. ?  ?  ? ? ?Hand Dominance  ?   ? ?  ?Extremity/Trunk Assessment  ? Upper Extremity Assessment ?Upper Extremity Assessment: Overall WFL for tasks assessed ?  ? ?Lower Extremity Assessment ?Lower Extremity Assessment: Overall WFL for tasks assessed ?  ? ?Cervical / Trunk Assessment ?Cervical / Trunk Assessment: Other exceptions ?Cervical / Trunk Exceptions: Forward head - worked on cervical retraction, no effect on pain  ?Communication  ? Communication: No difficulties  ?Cognition Arousal/Alertness: Awake/alert ?Behavior During Therapy: [facility] for tasks assessed/performed ?Overall Cognitive Status: Within Functional Limits for tasks assessed ?  ?  ?  ?  ?  ?  ?  ?  ?  ?  ?  ?  ?  ?  ?  ?  ?General Comments: somewhat distracted by pain but overal WFL ?  ?  ? ?  ?General Comments General comments (skin integrity, edema, etc.): Pt denies any dizziness or lightheadedness at this time.  Unsteadiness and symptoms related to headache and nausea.  No nystagmus, EOEM intact, able to turn head without dizziness. ? ?  ?Exercises    ? ?Assessment/Plan  ?  ?PT Assessment Patient needs continued PT services  ?PT Problem List  Decreased mobility;Decreased activity tolerance;Decreased balance;Decreased knowledge of use of DME ? ?   ?  ?PT Treatment Interventions DME instruction;Gait training;Stair training;Functional mobility training;Neuromuscular re-education;Balance training;Therapeutic exercise;Patient/family education   ? ?PT Goals (Current goals can be found in the Care Plan section)  ?Acute Rehab PT Goals ?Patient Stated Goal: decrease pain, get nerve block for pain ?PT Goal Formulation: With patient ?Time For Goal Achievement: 01/21/22 ?Potential to Achieve Goals: Good ? ?  ?Frequency Min 3X/week ?  ? ? ?Co-evaluation   ?  ?  ?  ?  ? ? ?  ?AM-PAC PT "6 Clicks" Mobility  ?Outcome Measure Help needed turning from your back to your side while in a flat bed without using bedrails?: None ?Help needed moving from lying on your back to sitting on the side of a flat bed without using bedrails?: None ?Help needed moving to and from a bed to a chair (including a wheelchair)?: A Little ?Help needed standing up from a chair using your arms (e.g., wheelchair or bedside chair)?: A Little ?Help needed to walk in hospital room?: A Little ?Help needed climbing 3-5 steps with a railing? : A Little ?6 Click Score: 20 ? ?  ?End of Session   ?Activity Tolerance: Patient limited by pain ?Patient left: in bed;with call bell/phone within reach;with bed alarm set ?Nurse Communication: Mobility  status ?PT Visit Diagnosis: Other abnormalities of gait and mobility (R26.89) ?  ? ?Time: 0518-3358 ?PT Time Calculation (min) (ACUTE ONLY): 25 min ? ? ?Charges:   PT Evaluation ?$PT Eval Low Complexity: 1 Low ?PT Treatments ?$Gait Training: 8-22 mins ?  ?   ? ? ?Abran Richard, PT ?Acute Rehab Services ?Pager [phone] ?[facility] [phone] ? ? ?[name] Nolan ?01/07/2022, 11:40 AM ? ?

## 2022-01-07 NOTE — Discharge Summary (Addendum)
?Physician Discharge Summary ?  ?Patient: [name] Nolan MRN: [id number] DOB: 11-19-1945  ?Admit date:     01/06/2022  ?Discharge date: 01/07/22  ?Discharge Physician: Corrie Mckusick Burnette Sautter  ? ?PCP: Orma Render, NP  ? ?Recommendations at discharge:  ? ?Follow-up with your neurologist as soon as possible to discuss about headache and further treatment options. ? ?Discharge Diagnoses: ?Active Problems: ?  Hypothyroidism ?  Chronic migraine without aura, intractable, without status migrainosus ?  Spinal stenosis at L4-L5 level ?  Hypokalemia ?  Headache ? ?Principal Problem (Resolved): ?  Vertigo ?Resolved Problems: ?  Ambulatory dysfunction ? ?Hospital Course: ?Patient is a 76 years old female with past medical history of migraines, presented to the hospital with severe headache, vertigo and difficulty ambulating.    Patient tried oxycodone and other medications at home without much help so decided to come to the hospital for further evaluation and treatment. In the ED, urinalysis showed rare bacteria.  Troponins were negative.  COVID and influenza was negative.  Patient had mild hypokalemia at 3.2.  Bilirubin was elevated at 1.3.  CBC was unremarkable.  Patient was then admitted to the hospital for further evaluation and observation. ? ?Assessment and plan. ? ?Principal Problem: ?  Vertigo ?Active Problems: ?  Hypothyroidism ?  Chronic migraine without aura, intractable, without status migrainosus ?  Spinal stenosis at L4-L5 level ?  Headache ?  Ambulatory dysfunction ?  ?Intractable retroauricular pain, headache.  History of migraine. ? Patient has been using botulinum toxin as outpatient and takes gabapentin as well history of migraines..  On rizatriptan Xanax and Effexor as outpatient which will be continued on discharge.  MRI of the brain and MRV brain was negative for acute findings including dural venous thrombosis. Add flexeril to the regimen for muscle relaxation.  I had a prolonged discussion with the  patient as well as patient's daughter and sister about the further follow-up with neurology.  Possibility of occipital neuralgia or neuropathy.  Patient was advised to take gabapentin 3 times daily dosing in the interim. ? ?Vertigo/unsteadiness.    CT angiogram of the head and neck was negative for acute occlusion or dissection.   MRI of the brain/MRV negative for acute stroke or thrombosis.  PT evaluation was performed with no specific PT needs.  Vertigo prior to discharge. ? ?Hypokalemia.  Potassium of 2.8 today.  Patient received 40 mEq of p.o. potassium and has been prescribed for 5 more days of potassium on discharge. ? ?History of hyperlipidemia.  Not on antilipid medication ? ?History of anxiety and depression. ?Continue Xanax and Effexor on discharge. ? ?Chronic back pain, spinal stenosis/ambulatory dysfunction. ?On gabapentin.  Has been changed to 3 times daily. ? ?Hypothyroidism.  Continue Synthroid. ? ?History of Vitamin B12 deficiency vitamin D deficiency. ?Vitamin B-12 at 1453.  Vitamin D level was 36 improved from last time.  TSH within normal limits.  Hemoglobin A1c at 5.3. ? ?Consultants: None ?Procedures performed: None ?Disposition: Home, I spoke with the patient's sister and daughter on the phone. ? ?Diet recommendation:  ?Discharge Diet Orders (From admission, onward)  ? ?  Start     Ordered  ? 01/07/22 0000  Diet - low sodium heart healthy       ? 01/07/22 0915  ? ?  ?  ? ?  ? ?Cardiac diet ?DISCHARGE MEDICATION: ?Allergies as of 01/07/2022   ? ?   Reactions  ? Statins Other (See Comments)  ? Muscle pain  ? Codeine Nausea And  Vomiting  ? ?  ? ?  ?Medication List  ?  ? ?STOP taking these medications   ? ?cephALEXin 500 MG capsule ?Commonly known as: KEFLEX ?  ?famotidine 20 MG tablet ?Commonly known as: PEPCID ?  ? ?  ? ?TAKE these medications   ? ?acetaminophen 325 MG tablet ?Commonly known as: TYLENOL ?Take 2 tablets (650 mg total) by mouth every 6 (six) hours as needed for mild pain (or Fever  >/= 101). ?  ?albuterol 108 (90 Base) MCG/ACT inhaler ?Commonly known as: VENTOLIN HFA ?Inhale 2 puffs into the lungs every 6 (six) hours as needed for wheezing or shortness of breath. ?  ?ALPRAZolam 0.5 MG tablet ?Commonly known as: Duanne Moron ?Take 1 tablet (0.5 mg total) by mouth at bedtime as needed for anxiety. ?  ?Botox 100 units Solr injection ?Generic drug: botulinum toxin Type A ?INJECT 155 UNITS  INTRAMUSCULARLY EVERY 3  MONTHS (GIVEN AT MD OFFICE, DISCARD UNUSED AFTER 1ST  USE) ?What changed:  ?how much to take ?how to take this ?when to take this ?additional instructions ?  ?cyclobenzaprine 5 MG tablet ?Commonly known as: FLEXERIL ?Take 1 tablet (5 mg total) by mouth 3 (three) times daily as needed for muscle spasms. ?  ?gabapentin 300 MG capsule ?Commonly known as: NEURONTIN ?Take 1 capsule (300 mg total) by mouth 3 (three) times daily. ?  ?ondansetron 4 MG tablet ?Commonly known as: Zofran ?Take 1 tablet (4 mg total) by mouth every 8 (eight) hours as needed for nausea or vomiting. ?  ?Ozempic (0.25 or 0.5 MG/DOSE) 2 MG/1.5ML Sopn ?Generic drug: Semaglutide(0.25 or 0.'5MG'$ /DOS) ?Inject 0.5 mg as directed once a week. ?  ?potassium chloride SA 20 MEQ tablet ?Commonly known as: KLOR-CON M ?Take 1 tablet (20 mEq total) by mouth daily for 5 days. ?  ?rizatriptan 10 MG disintegrating tablet ?Commonly known as: MAXALT-MLT ?Take 1 tablet (10 mg total) by mouth as needed for migraine. May repeat in 2 hours if needed ?  ?Synthroid 75 MCG tablet ?Generic drug: levothyroxine ?Take 75 mcg by mouth daily before breakfast. ?  ?venlafaxine XR 75 MG 24 hr capsule ?Commonly known as: EFFEXOR-XR ?Take 1 capsule (75 mg total) by mouth daily with breakfast. ?  ? ?  ? ?Subjective   ?Still complains of retroauricular pain.  Denies any vertigo dizziness.  Was able to ambulate without issue with physical therapy.  Wishes to go home. ? ?Discharge Exam: ?Filed Weights  ? 01/06/22 0708  ?Weight: 79.8 kg  ? ? ?  01/07/2022  ? 10:31 AM  01/07/2022  ?  9:31 AM 01/07/2022  ?  3:15 AM  ?Vitals with BMI  ?Systolic 161 096 045  ?Diastolic 81 91 77  ?Pulse  67 63  ?  ?General:  Average built, not in obvious distress ?HENT:   No scleral pallor or icterus noted. Oral mucosa is moist.  ?Chest:  Clear breath sounds.  Diminished breath sounds bilaterally. No crackles or wheezes.  ?CVS: S1 &S2 heard. No murmur.  Regular rate and rhythm. ?Abdomen: Soft, nontender, nondistended.  Bowel sounds are heard.   ?Extremities: No cyanosis, clubbing or edema.  Peripheral pulses are palpable. ?Psych: Alert, awake and oriented, mildly anxious, ?CNS:  No cranial nerve deficits.  Power equal in all extremities.   ?Skin: Warm and dry.  No rashes noted. ? ? ?Condition at discharge: good ? ?The results of significant diagnostics from this hospitalization (including imaging, microbiology, ancillary and laboratory) are listed below for reference.  ? ?  Imaging Studies: ?CT ANGIO HEAD NECK W WO CM ? ?Result Date: 01/06/2022 ?CLINICAL DATA:  Cerebral aneurysm screening, high risk. Nausea with pain and head behind the right ear. Difficulty ambulating.2 EXAM: CT ANGIOGRAPHY HEAD AND NECK TECHNIQUE: Multidetector CT imaging of the head and neck was performed using the standard protocol during bolus administration of intravenous contrast. Multiplanar CT image reconstructions and MIPs were obtained to evaluate the vascular anatomy. Carotid stenosis measurements (when applicable) are obtained utilizing NASCET criteria, using the distal internal carotid diameter as the denominator. RADIATION DOSE REDUCTION: This exam was performed according to the departmental dose-optimization program which includes automated exposure control, adjustment of the mA and/or kV according to patient size and/or use of iterative reconstruction technique. CONTRAST:  49m OMNIPAQUE IOHEXOL 350 MG/ML SOLN COMPARISON:  Brain MRI 03/23/2015 FINDINGS: CT HEAD FINDINGS Brain: No evidence of acute infarction, hemorrhage,  hydrocephalus, extra-axial collection or mass lesion/mass effect. Patchy low-density in the cerebral white matter attributed to chronic small vessel ischemia. Vascular: See below Skull: Normal. Negative for frac

## 2022-01-07 NOTE — Telephone Encounter (Signed)
Can we squeeze in a nerve block with me or NP tomorrow? Patient just discharged from hospital. Or possibly a migraine cocktail?  ?

## 2022-01-07 NOTE — Plan of Care (Signed)

## 2022-01-07 NOTE — Progress Notes (Signed)
Patient given prn Hydralazine and Zofran, MD aware of elevated blood pressure and nausea, okay with continued discharge.  Discharge instructions reviewed with patient and copy given, patient to be transported by family car home, IV removal well tolerated. ?

## 2022-01-07 NOTE — Telephone Encounter (Signed)
Pt is in the hospital, pt states she had a MRI last night.  ?Pt has not had a stroke, everything looks good but pt is still in pain and very weak.  ?Pt states the hospital told her a nerve block in Occipital nerve will help her pain.  ?Pt is requesting if she can get a nerve block from Dr. Jaynee Eagles to resolve her problem.  ?Pt does not have her cell phone on her but would like a call back.  ?Pt is at [facility] ?[place] room 22  ?

## 2022-01-07 NOTE — TOC Transition Note (Signed)
Transition of Care (TOC) - CM/SW Discharge Note ? ? ?Patient Details  ?Name: [name] Nolan ?MRN: [id number] ?Date of Birth: 05-Sep-1946 ? ?Transition of Care (TOC) CM/SW Contact:  ?Pollie Friar, RN ?Phone Number: ?01/07/2022, 11:40 AM ? ? ?Clinical Narrative:    ?Patient discharging home with self care. Patient states she has plenty of people to check on her and has a walker at home.  ?Patient has transportation outside the hospital and denies issues with her home medications.  ?Pt has transport home today. ? ? ?Final next level of care: Home/Self Care ?Barriers to Discharge: Continued Medical Work up ? ? ?Patient Goals and CMS Choice ?  ?  ?  ? ?Discharge Placement ?  ?           ?  ?  ?  ?  ? ?Discharge Plan and Services ?  ?  ?           ?  ?  ?  ?  ?  ?  ?  ?  ?  ?  ? ?Social Determinants of Health (SDOH) Interventions ?  ? ? ?Readmission Risk Interventions ?   ? View : No data to display.  ?  ?  ?  ? ? ? ? ? ?

## 2022-01-08 ENCOUNTER — Ambulatory Visit (INDEPENDENT_AMBULATORY_CARE_PROVIDER_SITE_OTHER): Payer: Medicare PPO | Admitting: Neurology

## 2022-01-08 ENCOUNTER — Encounter: Payer: Self-pay | Admitting: *Deleted

## 2022-01-08 VITALS — BP 113/85 | HR 81

## 2022-01-08 DIAGNOSIS — M5481 Occipital neuralgia: Secondary | ICD-10-CM

## 2022-01-08 DIAGNOSIS — R519 Headache, unspecified: Secondary | ICD-10-CM

## 2022-01-08 HISTORY — DX: Occipital neuralgia: M54.81

## 2022-01-08 NOTE — Telephone Encounter (Addendum)
Will Schedule patient for nerve block today at [place]  ? ? ? ?

## 2022-01-08 NOTE — Patient Instructions (Addendum)
Occipital Neuralgia  Occipital neuralgia is a type of headache that causes brief episodes of very bad pain in the back of the head. Pain from occipital neuralgia may spread (radiate) to other parts of the head. These headaches may be caused by irritation of the nerves that leave the spinal cord high up in the neck, just below the base of the skull (occipital nerves). The occipital nerves transmit sensations from the back of the head, the top of the head, and the areas behind the ears. What are the causes? This condition can occur without any known cause (primary headache syndrome). In other cases, this condition is caused by pressure on or irritation of one of the two occipital nerves. Pressure and irritation may be due to: Muscle spasm in the neck. Neck injury. Wear and tear of the vertebrae in the neck (osteoarthritis). Disease of the disks that separate the vertebrae. Swollen blood vessels that put pressure on the occipital nerves. Infections. Tumors. Diabetes. What are the signs or symptoms? This condition causes brief burning, stabbing, electric, shocking, or shooting pain in the back of the head that can radiate to the top of the head. It can happen on one side or both sides of the head. It can also cause: Pain behind the eye. Pain triggered by neck movement or hair brushing. Scalp tenderness. Aching in the back of the head between episodes of very bad pain. Pain that gets worse with exposure to bright lights. How is this diagnosed? Your health care provider may diagnose the condition based on a physical exam and your symptoms. Tests may be done, such as: Imaging studies of the brain and neck (cervical spine), such as an MRI or CT scan. These look for causes of pinched nerves. Applying pressure to the nerves in the neck to try to re-create the pain. Injection of numbing medicine into the occipital nerve areas to see if pain goes away (diagnostic nerve block). How is this  treated? Treatment for this condition may begin with simple measures, such as: Rest. Massage. Applying heat or cold to the area. Over-the-counter pain relievers. If these measures do not work, you may need other treatments, including: Medicines, such as: Prescription-strength anti-inflammatory medicines. Muscle relaxants. Anti-seizure medicines, which can relieve pain. Antidepressants, which can relieve pain. Injected medicines, such as medicines that numb the area (local anesthetic) and steroids. Pulsed radiofrequency ablation. This is when wires are implanted to deliver electrical impulses that block pain signals from the occipital nerve. Surgery to relieve nerve pressure. Physical therapy. Follow these instructions at home: Managing pain     Avoid any activities that cause pain. Rest when you have an attack of pain. Try gentle massage to relieve pain. Try a different pillow or sleeping position. If directed, apply heat to the affected area as often as told by your health care provider. Use the heat source that your health care provider recommends, such as a moist heat pack or a heating pad. Place a towel between your skin and the heat source. Leave the heat on for 20-30 minutes. Remove the heat if your skin turns bright red. This is especially important if you are unable to feel pain, heat, or cold. You have a greater risk of getting burned. If directed, put ice on the back of your head and neck area. To do this: Put ice in a plastic bag. Place a towel between your skin and the bag. Leave the ice on for 20 minutes, 2-3 times a day. Remove the ice   if your skin turns bright red. This is very important. If you cannot feel pain, heat, or cold, you have a greater risk of damage to the area. General instructions Take over-the-counter and prescription medicines only as told by your health care provider. Avoid things that make your symptoms worse, such as bright lights. Try to stay  active. Get regular exercise that does not cause pain. Ask your health care provider to suggest safe exercises for you. Work with a physical therapist to learn stretching exercises you can do at home. Practice good posture. Keep all follow-up visits. This is important. Contact a health care provider if: Your medicine is not working. You have new or worsening symptoms. Get help right away if: You have very bad head pain that does not go away. You have a sudden change in vision, balance, or speech. These symptoms may represent a serious problem that is an emergency. Do not wait to see if the symptoms will go away. Get medical help right away. Call your local emergency services (911 in the [place] Do not drive yourself to the hospital. Summary Occipital neuralgia is a type of headache that causes brief episodes of very bad pain in the back of the head. Pain from occipital neuralgia may spread (radiate) to other parts of the head. Treatment for this condition includes rest, massage, and medicines. This information is not intended to replace advice given to you by your health care provider. Make sure you discuss any questions you have with your health care provider. Document Revised: 07/01/2020 Document Reviewed: 07/01/2020 [employer] Patient Education  2023 [employer]  

## 2022-01-08 NOTE — Progress Notes (Signed)
Performed by Dr. Jaynee Eagles M.D. All procedures a documented below were medically necessary, reasonable and appropriate based on the patient's history, medical diagnosis and physician opinion. Verbal informed consent was obtained from the patient, patient was informed of potential risk of procedure, including bruising, bleeding, hematoma formation, infection, muscle weakness, muscle pain, numbness, transient hypertension, transient hyperglycemia and transient insomnia among others. All areas injected were topically clean with isopropyl rubbing alcohol. Nonsterile nonlatex gloves were worn during the procedure. ? ?1. Greater occipital nerve block [phone]). The greater occipital nerve site was identified at the nuchal line medial to the occipital artery. Medication was injected into the right occipital nerve areas and suboccipital areas. Patient's condition is associated with inflammation of the greater occipital nerve and associated multiple groups. Injection was deemed medically necessary, reasonable and appropriate. Injection represents a separate and unique surgical service. ? ?2. Lesser occipital nerve block ([id number]). The lesser occipital nerve site was identified approximately 2 cm lateral to the greater occipital nerve. Occasion was injected into the right occipital nerve areas. Patient's condition is associated with inflammation of the lesser occipital nerve and associated muscle groups. Injection was deemed medically necessary, reasonable and appropriate. Injection represents a separate and unique surgical service. ? ? ?3. Auriculotemporal nerve block [phone]): The Auriculotemporal nerve site was identified along the posterior margin of the sternocleidomastoid muscle toward the base of the ear. Medication was injected into the right radicular temporal nerve areas. Patient's condition is associated with inflammation of the Auriculotemporal Nerve and associated muscle groups. Injection was deemed medically necessary,  reasonable and appropriate. Injection represents a separate and unique surgical service. ? ? ? ?

## 2022-01-08 NOTE — Progress Notes (Signed)
Nerve block w/o steroid: ?Pt signed consent ? ?0.5% Bupivocaine 9 mL ?LOT: [id number] ?EXP: 09/2024 ?[place]: [id number] ? ?

## 2022-01-08 NOTE — Telephone Encounter (Signed)
Spoke with patient and let her know we have her scheduled for 4 pm today with Dr Jaynee Eagles for a nerve block. Pt very appreciative. She will have a ride.  ?

## 2022-01-09 LAB — CULTURE, BLOOD (ROUTINE X 2)
Culture: NO GROWTH
Culture: NO GROWTH

## 2022-01-21 ENCOUNTER — Inpatient Hospital Stay (HOSPITAL_BASED_OUTPATIENT_CLINIC_OR_DEPARTMENT_OTHER): Payer: Medicare PPO | Admitting: Nurse Practitioner

## 2022-01-27 ENCOUNTER — Ambulatory Visit (HOSPITAL_BASED_OUTPATIENT_CLINIC_OR_DEPARTMENT_OTHER): Payer: Medicare PPO | Admitting: Nurse Practitioner

## 2022-02-13 ENCOUNTER — Encounter (HOSPITAL_BASED_OUTPATIENT_CLINIC_OR_DEPARTMENT_OTHER): Payer: Self-pay | Admitting: Nurse Practitioner

## 2022-02-13 ENCOUNTER — Ambulatory Visit (HOSPITAL_BASED_OUTPATIENT_CLINIC_OR_DEPARTMENT_OTHER): Payer: Medicare PPO | Admitting: Nurse Practitioner

## 2022-02-13 VITALS — BP 117/72 | HR 78 | Ht 65.0 in | Wt 178.0 lb

## 2022-02-13 DIAGNOSIS — F418 Other specified anxiety disorders: Secondary | ICD-10-CM

## 2022-02-13 DIAGNOSIS — R300 Dysuria: Secondary | ICD-10-CM

## 2022-02-13 DIAGNOSIS — E038 Other specified hypothyroidism: Secondary | ICD-10-CM | POA: Diagnosis not present

## 2022-02-13 DIAGNOSIS — E063 Autoimmune thyroiditis: Secondary | ICD-10-CM

## 2022-02-13 DIAGNOSIS — Z09 Encounter for follow-up examination after completed treatment for conditions other than malignant neoplasm: Secondary | ICD-10-CM

## 2022-02-13 DIAGNOSIS — M5481 Occipital neuralgia: Secondary | ICD-10-CM

## 2022-02-13 MED ORDER — ALPRAZOLAM 0.5 MG PO TABS: 0.5000 mg | ORAL_TABLET | Freq: Every evening | ORAL | 3 refills | Status: DC | PRN

## 2022-02-13 NOTE — Progress Notes (Signed)
  [name], DNP, AGNP-c [place], [place] [address] [phone] Office [phone] Fax  ESTABLISHED PATIENT- Chronic Health and/or Follow-Up Visit  There were no vitals taken for this visit.  No chief complaint on file.   HPI  [name] Nolan  is a 76 y.o. year old female presenting today for evaluation and management of the following: Hospital F/U: Occipital Neuralgia Thyroid levels low  Had a severe headache on the base on the right side of her skill that would not release with treatment at home She was seen in the ED, but she has no recollection of going to the hospital or the period of time while she was there   ROS All ROS negative with exception of what is listed in HPI  PHYSICAL EXAM Physical Exam  ASSESSMENT & PLAN Problem List Items Addressed This Visit   None    FOLLOW-UP No follow-ups on file.  Time: ***minutes, >50% spent counseling, care coordination, chart review, and documentation.   [name], DNP, AGNP-c

## 2022-02-13 NOTE — Patient Instructions (Signed)
It was a pleasure seeing you today. I hope your time spent with [place] was pleasant and helpful. Please let us know if there is anything we can do to improve the service you receive.   I will check your urine today and make sure that you dont have any residual infection going on.  I will recheck your thyroid labs to make sure we dont need to change the dose of this. I will let you know.  I have sent in a refill of your thyroid.  Please let me know if you have any more symptoms.   Important Office Information Lab Results If labs were ordered, please note that you will see results through [place] as soon as they come available from [place].  It takes up to 5 business days for the results to be routed to me and for me to review them once all of the lab results have come through from [facility]. I will make recommendations based on your results and send these through [place] or someone from the office will call you to discuss. If your labs are abnormal, we may contact you to schedule a visit to discuss the results and make recommendations.  If you have not heard from [place] within 5 business days or you have waited longer than a week and your lab results have not come through on [place], please feel free to call the office or send a message through [place] to follow-up on these labs.   Referrals If referrals were placed today, the office where the referral was sent will contact you either by phone or through [place] to set up scheduling. Please note that it can take up to a week for the referral office to contact you. If you do not hear from them in a week, please contact the referral office directly to inquire about scheduling.   Condition Treated If your condition worsens or you begin to have new symptoms, please schedule a follow-up appointment for further evaluation. If you are not sure if an appointment is needed, you may call the office to leave a message for the nurse and someone will contact you with  recommendations.  If you have an urgent or life threatening emergency, please do not call the office, but seek emergency evaluation by calling 911 or going to the nearest emergency room for evaluation.   MyChart and Phone Calls Please do not use MyChart for urgent messages. It may take up to 3 business days for MyChart messages to be read by staff and if they are unable to handle the request, an additional 3 business days for them to be routed to me and for my response.  Messages sent to the provider through [place] do not come directly to the provider, please allow time for these messages to be routed and for me to respond.  We get a large volume of MyChart messages daily and these are responded to in the order received.   For urgent messages, please call the office at [phone] and speak with the front office staff or leave a message on the line of my assistant for guidance.  We are seeing patients from the hours of 8:00 am through 5:00 pm and calls directly to the nurse may not be answered immediately due to seeing patients, but your call will be returned as soon as possible.  Phone  messages received after 4:00 PM Monday through Thursday may not be returned until the following business day. Phone messages received after 11:00 AM  on Friday may not be returned until Monday.   After Hours We share on call hours with providers from other offices. If you have an urgent need after hours that cannot wait until the next business day, please contact the on call provider by calling the office number. A nurse will speak with you and contact the provider if needed for recommendations.  If you have an urgent or life threatening emergency after hours, please do not call the on call provider, but seek emergency evaluation by calling 911 or going to the nearest emergency room for evaluation.   Paperwork All paperwork requires a minimum of 5 days to complete and return to you or the designated personnel.  Please keep this in mind when bringing in forms or sending requests for paperwork completion to the office.

## 2022-02-14 DIAGNOSIS — R300 Dysuria: Secondary | ICD-10-CM | POA: Insufficient documentation

## 2022-02-14 DIAGNOSIS — Z09 Encounter for follow-up examination after completed treatment for conditions other than malignant neoplasm: Secondary | ICD-10-CM | POA: Insufficient documentation

## 2022-02-14 HISTORY — DX: Dysuria: R30.0

## 2022-02-14 HISTORY — DX: Encounter for follow-up examination after completed treatment for conditions other than malignant neoplasm: Z09

## 2022-02-14 LAB — T4, FREE: Free T4: 1.35 ng/dL (ref 0.82–1.77)

## 2022-02-14 LAB — TSH: TSH: 0.122 u[IU]/mL — ABNORMAL LOW (ref 0.450–4.500)

## 2022-02-14 NOTE — Assessment & Plan Note (Signed)
Chronic.  She is still dealing with the recent passing of her husband and having her daughter living with her.  She reports that this situation has improved but is still stressful.  She is currently trying to manage her husband's estate with the [place] which is frustrating.  No concerns for significant exacerbation at this time.  She is taking Xanax as needed for management.  PDMP reviewed today with no signs of abnormal or inappropriate use. We will send refill today.  Patient aware to notify immediately if symptoms of anxiety or depression worsen.

## 2022-02-14 NOTE — Assessment & Plan Note (Signed)
>>  ASSESSMENT AND PLAN FOR HYPOTHYROIDISM WRITTEN ON 02/14/2022  7:37 AM BY Tycho Cheramie E, NP  Chronic.  Recent labs from hospitalization show lower than expected TSH.  We will recheck labs today and make changes to medication as appropriate based on findings.  No symptoms present at this time.

## 2022-02-14 NOTE — Assessment & Plan Note (Signed)
Chronic.  Recent labs from hospitalization show lower than expected TSH.  We will recheck labs today and make changes to medication as appropriate based on findings.  No symptoms present at this time.

## 2022-02-14 NOTE — Assessment & Plan Note (Signed)
Review of recent hospitalization notes with patient today.  Review of lab results.  It does appear that during hospitalization patient was experiencing symptoms of UTI.  I do suspect that these have contributed to some of the amnesia of the surrounding events of the hospitalization in addition to the pain she was experiencing.  At this time all symptoms have resolved and there are no alarm symptoms present. Review of hospitalization labs does show that thyroid levels were abnormal.  We will recheck these today. We will also recheck for resolution of urinary tract infection given the severity of symptoms that were present. No recurrent headaches since nerve block completed. Encourage patient to follow closely with neurology for evaluation and management. Patient appears stable at this time with no concerning findings on exam or evaluation.

## 2022-02-14 NOTE — Assessment & Plan Note (Signed)
Recent UTI with hospitalization.  Suspect that this infection contributed to the memory changes patient was experiencing.  She tells me today that she is experiencing some irritation in the urethra but no symptoms of increased pressure or significant burning.  We will obtain labs today to evaluate for possible continued infection.  Also consider vaginitis related to recent antibiotic use.  If UA and culture are negative we will plan to treat empirically for vulvovaginitis and monitor.

## 2022-02-14 NOTE — Assessment & Plan Note (Signed)
Recent hospitalization for severe headache related to occipital neuralgia of the right side.  All symptoms have resolved with the use of nerve block to the area.  No alarm symptoms present today.  Patient is physically and mentally intact.  We will continue to monitor and collaborate care with neurology.  Patient is aware to notify immediately if symptoms return.

## 2022-02-18 ENCOUNTER — Encounter (HOSPITAL_BASED_OUTPATIENT_CLINIC_OR_DEPARTMENT_OTHER): Payer: Self-pay | Admitting: Nurse Practitioner

## 2022-02-21 ENCOUNTER — Encounter (HOSPITAL_BASED_OUTPATIENT_CLINIC_OR_DEPARTMENT_OTHER): Payer: Self-pay | Admitting: Nurse Practitioner

## 2022-02-25 ENCOUNTER — Encounter (HOSPITAL_BASED_OUTPATIENT_CLINIC_OR_DEPARTMENT_OTHER): Payer: Self-pay | Admitting: Nurse Practitioner

## 2022-02-25 ENCOUNTER — Other Ambulatory Visit (HOSPITAL_BASED_OUTPATIENT_CLINIC_OR_DEPARTMENT_OTHER): Payer: Self-pay | Admitting: Nurse Practitioner

## 2022-02-25 DIAGNOSIS — E038 Other specified hypothyroidism: Secondary | ICD-10-CM

## 2022-02-25 MED ORDER — LEVOTHYROXINE SODIUM 50 MCG PO TABS: 50.0000 ug | ORAL_TABLET | Freq: Every day | ORAL | 3 refills | Status: DC

## 2022-02-25 NOTE — Progress Notes (Signed)
Please call patient to schedule lab f/u in 8 weeks for thyroid

## 2022-02-25 NOTE — Telephone Encounter (Signed)
The urine the test that was ordered on 02/13/2022 wasnt collected  Did you receive the labs from the TSH and T4, free?  Patient is requesting interpretation of labs, she seen them on mychart. Please result labs today, pt has waited since 02/14/2022 for the results and next steps of care.

## 2022-02-25 NOTE — Addendum Note (Signed)
Addended by: Kaelob Persky, Clarise Cruz E on: 02/25/2022 12:02 PM   Modules accepted: Orders

## 2022-02-27 ENCOUNTER — Ambulatory Visit (INDEPENDENT_AMBULATORY_CARE_PROVIDER_SITE_OTHER): Payer: Medicare PPO | Admitting: Neurology

## 2022-02-27 DIAGNOSIS — G43719 Chronic migraine without aura, intractable, without status migrainosus: Secondary | ICD-10-CM | POA: Diagnosis not present

## 2022-02-27 MED ORDER — GABAPENTIN 300 MG PO CAPS: 1200.0000 mg | ORAL_CAPSULE | Freq: Every day | ORAL | 11 refills | Status: DC

## 2022-02-27 NOTE — Progress Notes (Unsigned)
Consent Form Botulism Toxin Injection For Chronic Migraine  12/07/2021: Stable 09/03/2021 stable: Doing extremely well at least 70% improvement continues in migraine frequency. 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 [name] is in a geriatric psych facility in [place].  Reviewed orally with patient, additionally signature is on file:  Botulism toxin has been approved by the Federal drug administration for treatment of chronic migraine. Botulism toxin does not cure chronic migraine and it may not be effective in some patients.  The administration of botulism toxin is accomplished by injecting a small amount of toxin into the muscles of the neck and head. Dosage must be titrated for each individual. Any benefits resulting from botulism toxin tend to wear off after 3 months with a repeat injection required if benefit is to be maintained. Injections are usually done every 3-4 months with maximum effect peak achieved by about 2 or 3 weeks. Botulism toxin is expensive and you should be sure of what costs you will incur resulting from the injection.  The side effects of botulism toxin use for chronic migraine may include:   -Transient, and usually mild, facial weakness with facial injections  -Transient, and usually mild, head or neck weakness with head/neck injections  -Reduction or loss of forehead facial animation due to forehead muscle weakness  -Eyelid drooping  -Dry eye  -Pain at the site of injection or bruising at the site of injection  -Double vision  -Potential unknown long term risks  Contraindications: You should not have Botox if you are pregnant, nursing, allergic to albumin, have an infection, skin condition, or muscle weakness at the site of the injection, or have myasthenia gravis, Lambert-Eaton syndrome, or ALS.  It is also possible that as with any injection, there may be an allergic reaction or no effect from the medication. Reduced effectiveness after repeated injections is sometimes seen and  rarely infection at the injection site may occur. All care will be taken to prevent these side effects. If therapy is given over a long time, atrophy and wasting in the muscle injected may occur. Occasionally the patient's become refractory to treatment because they develop antibodies to the toxin. In this event, therapy needs to be modified.  I have read the above information and consent to the administration of botulism toxin.    BOTOX PROCEDURE NOTE FOR MIGRAINE HEADACHE    Contraindications and precautions discussed with patient(above). Aseptic procedure was observed and patient tolerated procedure. Procedure performed by Dr. Georgia Dom  The condition has existed for more than 6 months, and pt does not have a diagnosis of ALS, Myasthenia Gravis or Lambert-Eaton Syndrome.  Risks and benefits of injections discussed and pt agrees to proceed with the procedure.  Written consent obtained  These injections are medically necessary. Pt  receives good benefits from these injections. These injections do not cause sedations or hallucinations which the oral therapies may cause.  Description of procedure:  The patient was placed in a sitting position. The standard protocol was used for Botox as follows, with 5 units of Botox injected at each site:   -Procerus muscle, midline injection  -Corrugator muscle, bilateral injection  -Frontalis muscle, bilateral injection, with 2 sites each side, medial injection was performed in the upper one third of the frontalis muscle, in the region vertical from the medial inferior edge of the superior orbital rim. The lateral injection was again in the upper one third of the forehead vertically above the lateral limbus of the cornea, 1.5 cm lateral to the medial injection  site.  -Temporalis muscle injection, 4 sites, bilaterally. The first injection was 3 cm above the tragus of the ear, second injection site was 1.5 cm to 3 cm up from the first injection site in  line with the tragus of the ear. The third injection site was 1.5-3 cm forward between the first 2 injection sites. The fourth injection site was 1.5 cm posterior to the second injection site.   -Occipitalis muscle injection, 3 sites, bilaterally. The first injection was done one half way between the occipital protuberance and the tip of the mastoid process behind the ear. The second injection site was done lateral and superior to the first, 1 fingerbreadth from the first injection. The third injection site was 1 fingerbreadth superiorly and medially from the first injection site.  -Cervical paraspinal muscle injection, 2 sites, bilateral knee first injection site was 1 cm from the midline of the cervical spine, 3 cm inferior to the lower border of the occipital protuberance. The second injection site was 1.5 cm superiorly and laterally to the first injection site.  -Trapezius muscle injection was performed at 3 sites, bilaterally. The first injection site was in the upper trapezius muscle halfway between the inflection point of the neck, and the acromion. The second injection site was one half way between the acromion and the first injection site. The third injection was done between the first injection site and the inflection point of the neck.   Will return for repeat injection in 3 months.   155 units of Botox was used, 45u Botox not injected was wasted. The patient tolerated the procedure well, there were no complications of the above procedure.

## 2022-02-27 NOTE — Progress Notes (Unsigned)
Botox- 200 units x 1 vial Lot: [id number] Expiration: 10/2024 NDC: 4840-3979-53  Bacteriostatic 0.9% Sodium Chloride- 34m total Lot: [id number] Expiration: 04/16/2023 NDC: [address]-79 Dx:  GM99.718   B/B

## 2022-03-24 ENCOUNTER — Ambulatory Visit (HOSPITAL_BASED_OUTPATIENT_CLINIC_OR_DEPARTMENT_OTHER): Payer: Medicare PPO

## 2022-03-24 ENCOUNTER — Encounter (HOSPITAL_BASED_OUTPATIENT_CLINIC_OR_DEPARTMENT_OTHER): Payer: Self-pay

## 2022-03-24 ENCOUNTER — Ambulatory Visit (HOSPITAL_BASED_OUTPATIENT_CLINIC_OR_DEPARTMENT_OTHER): Payer: Medicare PPO | Admitting: Nurse Practitioner

## 2022-03-25 ENCOUNTER — Ambulatory Visit (HOSPITAL_BASED_OUTPATIENT_CLINIC_OR_DEPARTMENT_OTHER): Payer: Medicare PPO | Admitting: Nurse Practitioner

## 2022-03-25 ENCOUNTER — Encounter (HOSPITAL_BASED_OUTPATIENT_CLINIC_OR_DEPARTMENT_OTHER): Payer: Self-pay | Admitting: Nurse Practitioner

## 2022-03-25 VITALS — BP 122/82 | HR 67 | Ht 65.0 in | Wt 178.2 lb

## 2022-03-25 DIAGNOSIS — E669 Obesity, unspecified: Secondary | ICD-10-CM | POA: Diagnosis not present

## 2022-03-25 DIAGNOSIS — E782 Mixed hyperlipidemia: Secondary | ICD-10-CM

## 2022-03-25 DIAGNOSIS — E8881 Metabolic syndrome: Secondary | ICD-10-CM | POA: Diagnosis not present

## 2022-03-25 DIAGNOSIS — F418 Other specified anxiety disorders: Secondary | ICD-10-CM

## 2022-03-25 MED ORDER — SEMAGLUTIDE-WEIGHT MANAGEMENT 0.25 MG/0.5ML ~~LOC~~ SOAJ: 0.2500 mg | SUBCUTANEOUS | 0 refills | Status: DC

## 2022-03-25 MED ORDER — SEMAGLUTIDE-WEIGHT MANAGEMENT 1 MG/0.5ML ~~LOC~~ SOAJ: 1.0000 mg | SUBCUTANEOUS | 2 refills | Status: DC

## 2022-03-25 MED ORDER — SEMAGLUTIDE-WEIGHT MANAGEMENT 0.5 MG/0.5ML ~~LOC~~ SOAJ: 0.5000 mg | SUBCUTANEOUS | 2 refills | Status: DC

## 2022-03-25 NOTE — Progress Notes (Signed)
[name], DNP, AGNP-c [place], [place] [address] [phone] Office [phone] Fax  ESTABLISHED PATIENT- Chronic Health and/or Follow-Up Visit  Blood pressure 122/82, pulse 67, height '5\' 5"'$  (1.651 m), weight 178 lb 3.2 oz (80.8 kg), SpO2 92 %.  Follow-up (She wants to discuss Wegovy or Mounjauro, she discussed with Dr Jaynee Eagles.)   HPI  [name] Nolan  is a 76 y.o. year old female presenting today for evaluation and management of the following: Weight Management She is watching her intake Walking 2-3 times a day for at least 30 minutes each walk  Not able to loose weight with these efforts Stress Daughter living with her which has caused increase stress in and around the home Son in law has had a mental break and her daughter has had to leave that situation Trying to finalize the death of her husband (it's been 4 months) and this is adding to the stress.  Memory  Feeling like she is losing names of things Have problems remember words or places Under a tremendous amount of stress with her daughter [name], a neighbor, comes to visit and in some ways this helps with social interaction, but in other ways adds to her stress   ROS All ROS negative with exception of what is listed in HPI  PHYSICAL EXAM Physical Exam Vitals and nursing note reviewed.  Constitutional:      General: She is not in acute distress.    Appearance: Normal appearance.  HENT:     Head: Normocephalic.  Eyes:     Extraocular Movements: Extraocular movements intact.     Conjunctiva/sclera: Conjunctivae normal.     Pupils: Pupils are equal, round, and reactive to light.  Neck:     Vascular: No carotid bruit.  Cardiovascular:     Rate and Rhythm: Normal rate and regular rhythm.     Pulses: Normal pulses.     Heart sounds: Normal heart sounds. No murmur heard. Pulmonary:     Effort: Pulmonary effort is normal.      Breath sounds: Normal breath sounds. No wheezing.  Abdominal:     General: Bowel sounds are normal. There is no distension.     Palpations: Abdomen is soft.     Tenderness: There is no abdominal tenderness. There is no guarding.  Musculoskeletal:        General: Normal range of motion.     Cervical back: Normal range of motion and neck supple.     Right lower leg: No edema.     Left lower leg: No edema.  Lymphadenopathy:     Cervical: No cervical adenopathy.  Skin:    General: Skin is warm and dry.     Capillary Refill: Capillary refill takes less than 2 seconds.  Neurological:     General: No focal deficit present.     Mental Status: She is alert and oriented to person, place, and time.  Psychiatric:        Mood and Affect: Mood normal.        Behavior: Behavior normal.        Thought Content: Thought content normal.        Judgment: Judgment normal.     ASSESSMENT & PLAN Problem List Items Addressed This Visit     Obesity - Primary    Increased weight with difficulty losing with diet and exercise alone. I do feel that her current stress levels are not helping in her  efforts as stress is likely to reduce her glucose control. Joint decision to start [facility] for weight management and monitor closely. I do feel that she will benefit from this treatment given history of insulin resistance, htn, and hld. Ideally she will loose weight and manage her co morbidities effectively with this treatment. We will plan to follow-up in 3 months or sooner if needed.       Relevant Medications   Semaglutide-Weight Management 1 MG/0.5ML SOAJ (Start on 05/22/2022)   Depression with anxiety    Chronic symptoms with exacerbation recently following the loss of her husband and her daughter having problems within her marriage that required her to move in with the patient. She is under more stress than usual and I do feel that many of her symptoms could be exacerbated by the increased stress. Counseling  services may be helpful to talk through the issues she is facing at this time and come up with coping mechanisms to help deal with the changes that she is currently facing. I do recommend increased exercise to help with mental stress and fatigue. I also recommend that she closely evaluate the living situation and make clear boundaries to avoid her daughters stressors impacting her negatively. We will monitor closely. If she chooses to seek counseling in the future OK to send in referral for her.       Mixed hyperlipidemia   Relevant Medications   Semaglutide-Weight Management 1 MG/0.5ML SOAJ (Start on 05/22/2022)   Insulin resistance   Relevant Medications   Semaglutide-Weight Management 1 MG/0.5ML SOAJ (Start on 05/22/2022)     FOLLOW-UP Return in about 3 months (around 06/25/2022) for 83mweight.   SWorthy Keeler DNP, AGNP-c 03/25/2022  3:16 PM

## 2022-03-25 NOTE — Patient Instructions (Signed)
I have sent in the [facility] for you- we will work to get insurance coverage for this. It may take a few days to get coverage approval. If we can't get approval, we will looks to see what else we can get coverage for [place].    I want you to do 1 thing for yourself every week- even if it is something small. You deserve time for yourself and NEED time for yourself.

## 2022-04-03 DIAGNOSIS — H52223 Regular astigmatism, bilateral: Secondary | ICD-10-CM | POA: Diagnosis not present

## 2022-04-03 DIAGNOSIS — H43813 Vitreous degeneration, bilateral: Secondary | ICD-10-CM | POA: Diagnosis not present

## 2022-04-03 DIAGNOSIS — Z961 Presence of intraocular lens: Secondary | ICD-10-CM | POA: Diagnosis not present

## 2022-04-03 DIAGNOSIS — H524 Presbyopia: Secondary | ICD-10-CM | POA: Diagnosis not present

## 2022-04-14 ENCOUNTER — Encounter (HOSPITAL_BASED_OUTPATIENT_CLINIC_OR_DEPARTMENT_OTHER): Payer: Self-pay | Admitting: Nurse Practitioner

## 2022-04-14 DIAGNOSIS — E669 Obesity, unspecified: Secondary | ICD-10-CM

## 2022-04-14 DIAGNOSIS — E8881 Metabolic syndrome: Secondary | ICD-10-CM

## 2022-04-17 MED ORDER — OZEMPIC (0.25 OR 0.5 MG/DOSE) 2 MG/3ML ~~LOC~~ SOPN: 0.2500 mg | PEN_INJECTOR | SUBCUTANEOUS | 0 refills | Status: DC

## 2022-04-21 ENCOUNTER — Other Ambulatory Visit (HOSPITAL_BASED_OUTPATIENT_CLINIC_OR_DEPARTMENT_OTHER): Payer: Self-pay

## 2022-04-21 DIAGNOSIS — E8881 Metabolic syndrome: Secondary | ICD-10-CM

## 2022-04-21 DIAGNOSIS — E669 Obesity, unspecified: Secondary | ICD-10-CM

## 2022-04-21 MED ORDER — OZEMPIC (0.25 OR 0.5 MG/DOSE) 2 MG/3ML ~~LOC~~ SOPN: 0.2500 | PEN_INJECTOR | SUBCUTANEOUS | 0 refills | Status: DC

## 2022-04-22 ENCOUNTER — Ambulatory Visit (HOSPITAL_BASED_OUTPATIENT_CLINIC_OR_DEPARTMENT_OTHER): Payer: Medicare PPO

## 2022-04-22 DIAGNOSIS — E063 Autoimmune thyroiditis: Secondary | ICD-10-CM | POA: Diagnosis not present

## 2022-04-22 DIAGNOSIS — E038 Other specified hypothyroidism: Secondary | ICD-10-CM | POA: Diagnosis not present

## 2022-04-23 LAB — TSH: TSH: 2.54 u[IU]/mL (ref 0.450–4.500)

## 2022-04-23 LAB — T4: T4, Total: 5.6 ug/dL (ref 4.5–12.0)

## 2022-05-04 NOTE — Assessment & Plan Note (Signed)
>>  ASSESSMENT AND PLAN FOR OBESITY WRITTEN ON 05/04/2022  3:30 PM BY Natina Wiginton E, NP  Increased weight with difficulty losing with diet and exercise alone. I do feel that her current stress levels are not helping in her efforts as stress is likely to reduce her glucose control. Joint decision to start [facility] for weight management and monitor closely. I do feel that she will benefit from this treatment given history of insulin resistance, htn, and hld. Ideally she will loose weight and manage her co morbidities effectively with this treatment. We will plan to follow-up in 3 months or sooner if needed.

## 2022-05-04 NOTE — Assessment & Plan Note (Signed)
Increased weight with difficulty losing with diet and exercise alone. I do feel that her current stress levels are not helping in her efforts as stress is likely to reduce her glucose control. Joint decision to start [facility] for weight management and monitor closely. I do feel that she will benefit from this treatment given history of insulin resistance, htn, and hld. Ideally she will loose weight and manage her co morbidities effectively with this treatment. We will plan to follow-up in 3 months or sooner if needed.

## 2022-05-04 NOTE — Assessment & Plan Note (Signed)
Chronic symptoms with exacerbation recently following the loss of her husband and her daughter having problems within her marriage that required her to move in with the patient. She is under more stress than usual and I do feel that many of her symptoms could be exacerbated by the increased stress. Counseling services may be helpful to talk through the issues she is facing at this time and come up with coping mechanisms to help deal with the changes that she is currently facing. I do recommend increased exercise to help with mental stress and fatigue. I also recommend that she closely evaluate the living situation and make clear boundaries to avoid her daughters stressors impacting her negatively. We will monitor closely. If she chooses to seek counseling in the future OK to send in referral for her.

## 2022-05-18 ENCOUNTER — Other Ambulatory Visit (HOSPITAL_BASED_OUTPATIENT_CLINIC_OR_DEPARTMENT_OTHER): Payer: Self-pay | Admitting: Nurse Practitioner

## 2022-05-27 ENCOUNTER — Ambulatory Visit (INDEPENDENT_AMBULATORY_CARE_PROVIDER_SITE_OTHER): Payer: Medicare PPO | Admitting: Neurology

## 2022-05-27 DIAGNOSIS — G43719 Chronic migraine without aura, intractable, without status migrainosus: Secondary | ICD-10-CM | POA: Diagnosis not present

## 2022-05-27 MED ORDER — EMGALITY 120 MG/ML ~~LOC~~ SOAJ: 120.0000 mg | SUBCUTANEOUS | 0 refills | Status: DC

## 2022-05-27 MED ORDER — ONABOTULINUMTOXINA 200 UNITS IJ SOLR
155.0000 [IU] | Freq: Once | INTRAMUSCULAR | Status: AC
  Administered 2022-05-27: 155 [IU] via INTRAMUSCULAR

## 2022-05-27 NOTE — Addendum Note (Signed)
Addended by: Sarina Ill B on: 05/27/2022 03:48 PM   Modules accepted: Orders

## 2022-05-27 NOTE — Progress Notes (Signed)
Consent Form Botulism Toxin Injection For Chronic Migraine  05/27/2022: stable, doing well 03/02/2022: Stable but under lots of stress 12/07/2021: Stable 09/03/2021 stable: Doing extremely well at least 70% improvement continues in migraine frequency. 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 [name] is in a geriatric psych facility in [place].  Reviewed orally with patient, additionally signature is on file:  Botulism toxin has been approved by the Federal drug administration for treatment of chronic migraine. Botulism toxin does not cure chronic migraine and it may not be effective in some patients.  The administration of botulism toxin is accomplished by injecting a small amount of toxin into the muscles of the neck and head. Dosage must be titrated for each individual. Any benefits resulting from botulism toxin tend to wear off after 3 months with a repeat injection required if benefit is to be maintained. Injections are usually done every 3-4 months with maximum effect peak achieved by about 2 or 3 weeks. Botulism toxin is expensive and you should be sure of what costs you will incur resulting from the injection.  The side effects of botulism toxin use for chronic migraine may include:   -Transient, and usually mild, facial weakness with facial injections  -Transient, and usually mild, head or neck weakness with head/neck injections  -Reduction or loss of forehead facial animation due to forehead muscle weakness  -Eyelid drooping  -Dry eye  -Pain at the site of injection or bruising at the site of injection  -Double vision  -Potential unknown long term risks  Contraindications: You should not have Botox if you are pregnant, nursing, allergic to albumin, have an infection, skin condition, or muscle weakness at the site of the injection, or have myasthenia gravis, Lambert-Eaton syndrome, or ALS.  It is also possible that as with any injection, there may be an allergic reaction or no effect from the medication.  Reduced effectiveness after repeated injections is sometimes seen and rarely infection at the injection site may occur. All care will be taken to prevent these side effects. If therapy is given over a long time, atrophy and wasting in the muscle injected may occur. Occasionally the patient's become refractory to treatment because they develop antibodies to the toxin. In this event, therapy needs to be modified.  I have read the above information and consent to the administration of botulism toxin.    BOTOX PROCEDURE NOTE FOR MIGRAINE HEADACHE    Contraindications and precautions discussed with patient(above). Aseptic procedure was observed and patient tolerated procedure. Procedure performed by Dr. Georgia Dom  The condition has existed for more than 6 months, and pt does not have a diagnosis of ALS, Myasthenia Gravis or Lambert-Eaton Syndrome.  Risks and benefits of injections discussed and pt agrees to proceed with the procedure.  Written consent obtained  These injections are medically necessary. Pt  receives good benefits from these injections. These injections do not cause sedations or hallucinations which the oral therapies may cause.  Description of procedure:  The patient was placed in a sitting position. The standard protocol was used for Botox as follows, with 5 units of Botox injected at each site:   -Procerus muscle, midline injection  -Corrugator muscle, bilateral injection  -Frontalis muscle, bilateral injection, with 2 sites each side, medial injection was performed in the upper one third of the frontalis muscle, in the region vertical from the medial inferior edge of the superior orbital rim. The lateral injection was again in the upper one third of the forehead vertically above the lateral  limbus of the cornea, 1.5 cm lateral to the medial injection site.  -Temporalis muscle injection, 4 sites, bilaterally. The first injection was 3 cm above the tragus of the ear, second  injection site was 1.5 cm to 3 cm up from the first injection site in line with the tragus of the ear. The third injection site was 1.5-3 cm forward between the first 2 injection sites. The fourth injection site was 1.5 cm posterior to the second injection site.   -Occipitalis muscle injection, 3 sites, bilaterally. The first injection was done one half way between the occipital protuberance and the tip of the mastoid process behind the ear. The second injection site was done lateral and superior to the first, 1 fingerbreadth from the first injection. The third injection site was 1 fingerbreadth superiorly and medially from the first injection site.  -Cervical paraspinal muscle injection, 2 sites, bilateral knee first injection site was 1 cm from the midline of the cervical spine, 3 cm inferior to the lower border of the occipital protuberance. The second injection site was 1.5 cm superiorly and laterally to the first injection site.  -Trapezius muscle injection was performed at 3 sites, bilaterally. The first injection site was in the upper trapezius muscle halfway between the inflection point of the neck, and the acromion. The second injection site was one half way between the acromion and the first injection site. The third injection was done between the first injection site and the inflection point of the neck.   Will return for repeat injection in 3 months.   155 units of Botox was used, 45u Botox not injected was wasted. The patient tolerated the procedure well, there were no complications of the above procedure.

## 2022-05-27 NOTE — Progress Notes (Signed)
Botox- 200 units x 1 vial Lot: [id number] Expiration: 10/2024 NDC: 8242-9980-69  Bacteriostatic 0.9% Sodium Chloride- 51m total Lot: [id number]: 04/16/2023 NDC: [address]-07 Dx: GD25.247 B/B

## 2022-06-26 ENCOUNTER — Ambulatory Visit (HOSPITAL_BASED_OUTPATIENT_CLINIC_OR_DEPARTMENT_OTHER): Payer: Medicare PPO | Admitting: Nurse Practitioner

## 2022-06-26 ENCOUNTER — Encounter (HOSPITAL_BASED_OUTPATIENT_CLINIC_OR_DEPARTMENT_OTHER): Payer: Self-pay | Admitting: Nurse Practitioner

## 2022-06-26 VITALS — BP 165/93 | HR 63 | Ht 65.0 in | Wt 174.0 lb

## 2022-06-26 DIAGNOSIS — Z6832 Body mass index (BMI) 32.0-32.9, adult: Secondary | ICD-10-CM | POA: Diagnosis not present

## 2022-06-26 DIAGNOSIS — E669 Obesity, unspecified: Secondary | ICD-10-CM | POA: Diagnosis not present

## 2022-06-26 MED ORDER — PHENTERMINE HCL 37.5 MG PO TABS: 37.5000 mg | ORAL_TABLET | Freq: Every day | ORAL | 2 refills | Status: DC

## 2022-06-26 NOTE — Patient Instructions (Signed)
We will plan to try the phentermine to see how this works with you towards your weight loss goal.  If you have any chest pain, shortness of breath, dizziness, palpitations, headaches, or blood pressure changes please stop this immediately and let me know.

## 2022-06-26 NOTE — Progress Notes (Signed)
[name], DNP, AGNP-c [address] [place], [place] [address] [phone] Office [phone] Fax  ESTABLISHED PATIENT- Chronic Health and/or Follow-Up Visit  Blood pressure (!) 165/93, pulse 63, height '5\' 5"'$  (1.651 m), weight 174 lb (78.9 kg), SpO2 100 %.    [name] Nolan is a 76 y.o. year old female presenting today for evaluation and management of the following: Follow-up (Patient presents today to discuss weight loss. Ozempic mad her sick. She would like to try phertemine)  Weight Management [name] presents today to discuss weight management.  Previously she has been on Ozempic for weight loss however this medication caused significant amount of nausea and gastrointestinal upset and she has therefore stopped.  She is interested in considering alternative medications which may be helpful for weight management.  She has used phentermine in the past and states been successful for her.  She denies any chest pain, palpitations, dizziness, headaches.   All ROS negative with exception of what is listed above.   PHYSICAL EXAM Physical Exam Vitals and nursing note reviewed.  Constitutional:      General: She is not in acute distress.    Appearance: Normal appearance.  HENT:     Head: Normocephalic.  Eyes:     Extraocular Movements: Extraocular movements intact.     Conjunctiva/sclera: Conjunctivae normal.     Pupils: Pupils are equal, round, and reactive to light.  Neck:     Vascular: No carotid bruit.  Cardiovascular:     Rate and Rhythm: Normal rate and regular rhythm.     Pulses: Normal pulses.     Heart sounds: Normal heart sounds. No murmur heard. Pulmonary:     Effort: Pulmonary effort is normal.     Breath sounds: Normal breath sounds. No wheezing.  Abdominal:     General: Bowel sounds are normal. There is no distension.     Palpations: Abdomen is soft.     Tenderness: There is no abdominal  tenderness. There is no guarding.  Musculoskeletal:        General: Normal range of motion.     Cervical back: Normal range of motion and neck supple.     Right lower leg: No edema.     Left lower leg: No edema.  Lymphadenopathy:     Cervical: No cervical adenopathy.  Skin:    General: Skin is warm and dry.     Capillary Refill: Capillary refill takes less than 2 seconds.  Neurological:     General: No focal deficit present.     Mental Status: She is alert and oriented to person, place, and time.  Psychiatric:        Mood and Affect: Mood normal.        Behavior: Behavior normal.        Thought Content: Thought content normal.        Judgment: Judgment normal.     PLAN Problem List Items Addressed This Visit     Adult BMI 32.0-32.9 kg/sq m    3 months for reevaluation.Discussion with medication options for patient today.  Unfortunately she was unable to tolerate Ozempic due to GI symptoms.  We will plan to trial phentermine for a short.  With close monitoring of anxiety blood pressure control as well as heart rate.  Patient is aware if blood pressure is higher than 140/85 to stop the medication immediately and contact the office.  She is also aware of alarm symptoms such as chest  pain, palpitations, or dizziness and if these prevent to stop the medication and notify the office immediately.  We will plan to follow-up in 3 months for reevaluation.      Relevant Medications   phentermine (ADIPEX-P) 37.5 MG tablet   Obesity - Primary   Relevant Medications   phentermine (ADIPEX-P) 37.5 MG tablet    Return in about 3 months (around 09/26/2022) for Phone call check in- weight.   [name], DNP, AGNP-c 06/26/2022  2:52 PM

## 2022-07-15 NOTE — Assessment & Plan Note (Addendum)
3 months for reevaluation.Discussion with medication options for patient today.  Unfortunately she was unable to tolerate Ozempic due to GI symptoms.  We will plan to trial phentermine for a short.  With close monitoring of anxiety blood pressure control as well as heart rate.  Patient is aware if blood pressure is higher than 140/85 to stop the medication immediately and contact the office.  She is also aware of alarm symptoms such as chest pain, palpitations, or dizziness and if these prevent to stop the medication and notify the office immediately.  We will plan to follow-up in 3 months for reevaluation.

## 2022-07-24 ENCOUNTER — Other Ambulatory Visit (HOSPITAL_BASED_OUTPATIENT_CLINIC_OR_DEPARTMENT_OTHER): Payer: Self-pay | Admitting: Nurse Practitioner

## 2022-07-24 DIAGNOSIS — E669 Obesity, unspecified: Secondary | ICD-10-CM

## 2022-07-24 DIAGNOSIS — E88819 Insulin resistance, unspecified: Secondary | ICD-10-CM

## 2022-07-28 ENCOUNTER — Encounter (HOSPITAL_BASED_OUTPATIENT_CLINIC_OR_DEPARTMENT_OTHER): Payer: Self-pay | Admitting: Nurse Practitioner

## 2022-07-31 ENCOUNTER — Other Ambulatory Visit (HOSPITAL_BASED_OUTPATIENT_CLINIC_OR_DEPARTMENT_OTHER): Payer: Self-pay

## 2022-07-31 DIAGNOSIS — E038 Other specified hypothyroidism: Secondary | ICD-10-CM

## 2022-07-31 MED ORDER — LEVOTHYROXINE SODIUM 50 MCG PO TABS: 50.0000 ug | ORAL_TABLET | Freq: Every day | ORAL | 3 refills | Status: DC

## 2022-08-19 ENCOUNTER — Ambulatory Visit (INDEPENDENT_AMBULATORY_CARE_PROVIDER_SITE_OTHER): Payer: Medicare PPO | Admitting: Neurology

## 2022-08-19 DIAGNOSIS — G43719 Chronic migraine without aura, intractable, without status migrainosus: Secondary | ICD-10-CM | POA: Diagnosis not present

## 2022-08-19 MED ORDER — GABAPENTIN 300 MG PO CAPS: 1200.0000 mg | ORAL_CAPSULE | Freq: Every day | ORAL | 4 refills | Status: DC

## 2022-08-19 MED ORDER — RIZATRIPTAN BENZOATE 10 MG PO TBDP: 10.0000 mg | ORAL_TABLET | ORAL | 11 refills | Status: DC | PRN

## 2022-08-19 MED ORDER — ONABOTULINUMTOXINA 200 UNITS IJ SOLR
155.0000 [IU] | Freq: Once | INTRAMUSCULAR | Status: AC
  Administered 2022-08-19: 155 [IU] via INTRAMUSCULAR

## 2022-08-19 MED ORDER — CYCLOBENZAPRINE HCL 5 MG PO TABS
5.0000 mg | ORAL_TABLET | Freq: Three times a day (TID) | ORAL | 3 refills | Status: DC | PRN
Start: 2022-08-19 — End: 2023-05-21

## 2022-08-19 MED ORDER — METHYLPREDNISOLONE 4 MG PO TBPK: ORAL_TABLET | ORAL | 1 refills | Status: DC

## 2022-08-19 NOTE — Progress Notes (Signed)
Consent Form Botulism Toxin Injection For Chronic Migraine  08/19/2022: stable: Doing extremely well at least 70% improvement continues in migraine frequency. 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  05/27/2022: stable, doing well 03/02/2022: Stable but under lots of stress 12/07/2021: Stable 09/03/2021 stable: Doing extremely well at least 70% improvement continues in migraine frequency. 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   Reviewed orally with patient, additionally signature is on file:  Botulism toxin has been approved by the Federal drug administration for treatment of chronic migraine. Botulism toxin does not cure chronic migraine and it may not be effective in some patients.  The administration of botulism toxin is accomplished by injecting a small amount of toxin into the muscles of the neck and head. Dosage must be titrated for each individual. Any benefits resulting from botulism toxin tend to wear off after 3 months with a repeat injection required if benefit is to be maintained. Injections are usually done every 3-4 months with maximum effect peak achieved by about 2 or 3 weeks. Botulism toxin is expensive and you should be sure of what costs you will incur resulting from the injection.  The side effects of botulism toxin use for chronic migraine may include:   -Transient, and usually mild, facial weakness with facial injections  -Transient, and usually mild, head or neck weakness with head/neck injections  -Reduction or loss of forehead facial animation due to forehead muscle weakness  -Eyelid drooping  -Dry eye  -Pain at the site of injection or bruising at the site of injection  -Double vision  -Potential unknown long term risks  Contraindications: You should not have Botox if you are pregnant, nursing, allergic to albumin, have an infection, skin condition, or muscle weakness at the site of the injection, or have myasthenia gravis, Lambert-Eaton syndrome, or ALS.  It is also possible that as with any  injection, there may be an allergic reaction or no effect from the medication. Reduced effectiveness after repeated injections is sometimes seen and rarely infection at the injection site may occur. All care will be taken to prevent these side effects. If therapy is given over a long time, atrophy and wasting in the muscle injected may occur. Occasionally the patient's become refractory to treatment because they develop antibodies to the toxin. In this event, therapy needs to be modified.  I have read the above information and consent to the administration of botulism toxin.    BOTOX PROCEDURE NOTE FOR MIGRAINE HEADACHE    Contraindications and precautions discussed with patient(above). Aseptic procedure was observed and patient tolerated procedure. Procedure performed by Dr. Georgia Dom  The condition has existed for more than 6 months, and pt does not have a diagnosis of ALS, Myasthenia Gravis or Lambert-Eaton Syndrome.  Risks and benefits of injections discussed and pt agrees to proceed with the procedure.  Written consent obtained  These injections are medically necessary. Pt  receives good benefits from these injections. These injections do not cause sedations or hallucinations which the oral therapies may cause.  Description of procedure:  The patient was placed in a sitting position. The standard protocol was used for Botox as follows, with 5 units of Botox injected at each site:   -Procerus muscle, midline injection  -Corrugator muscle, bilateral injection  -Frontalis muscle, bilateral injection, with 2 sites each side, medial injection was performed in the upper one third of the frontalis muscle, in the region vertical from the medial inferior edge of the superior orbital rim. The lateral injection was again in  the upper one third of the forehead vertically above the lateral limbus of the cornea, 1.5 cm lateral to the medial injection site.  -Temporalis muscle injection, 4 sites,  bilaterally. The first injection was 3 cm above the tragus of the ear, second injection site was 1.5 cm to 3 cm up from the first injection site in line with the tragus of the ear. The third injection site was 1.5-3 cm forward between the first 2 injection sites. The fourth injection site was 1.5 cm posterior to the second injection site.   -Occipitalis muscle injection, 3 sites, bilaterally. The first injection was done one half way between the occipital protuberance and the tip of the mastoid process behind the ear. The second injection site was done lateral and superior to the first, 1 fingerbreadth from the first injection. The third injection site was 1 fingerbreadth superiorly and medially from the first injection site.  -Cervical paraspinal muscle injection, 2 sites, bilateral knee first injection site was 1 cm from the midline of the cervical spine, 3 cm inferior to the lower border of the occipital protuberance. The second injection site was 1.5 cm superiorly and laterally to the first injection site.  -Trapezius muscle injection was performed at 3 sites, bilaterally. The first injection site was in the upper trapezius muscle halfway between the inflection point of the neck, and the acromion. The second injection site was one half way between the acromion and the first injection site. The third injection was done between the first injection site and the inflection point of the neck.   Will return for repeat injection in 3 months.   155 units of Botox was used, 45u Botox not injected was wasted. The patient tolerated the procedure well, there were no complications of the above procedure.

## 2022-08-19 NOTE — Patient Instructions (Addendum)
Methylprednisolone Tablets What is this medication? METHYLPREDNISOLONE (meth ill pred NISS oh lone) treats many conditions such as asthma, allergic reactions, arthritis, inflammatory bowel diseases, adrenal, and blood or bone marrow disorders. It works by decreasing inflammation, slowing down an overactive immune system, or replacing cortisol normally made in the body. Cortisol is a hormone that plays an important role in how the body responds to stress, illness, and injury. It belongs to a group of medications called steroids. This medicine may be used for other purposes; ask your health care provider or pharmacist if you have questions. COMMON BRAND NAME(S): Medrol, Medrol Dosepak What should I tell my care team before I take this medication? They need to know if you have any of these conditions: Cushing's syndrome Eye disease, vision problems Diabetes Glaucoma Heart disease High blood pressure Infection especially a viral infection, such as chickenpox, cold sores, or herpes Liver disease Mental health conditions Myasthenia gravis Osteoporosis Recent or upcoming vaccine Seizures Stomach or intestine problems Thyroid disease An unusual or allergic reaction to lactose, methylprednisolone, other medications, foods, dyes, or preservatives Pregnant or trying to get pregnant Breastfeeding How should I use this medication? Take this medication by mouth with a glass of water. Follow the directions on the prescription label. Take this medication with food. If you are taking this medication once a day, take it in the morning. Do not take it more often than directed. Do not suddenly stop taking your medication because you may develop a severe reaction. Your care team will tell you how much medication to take. If your care team wants you to stop the medication, the dose may be slowly lowered over time to avoid any side effects. Talk to your care team about the use of this medication in children.  Special care may be needed. Overdosage: If you think you have taken too much of this medicine contact a poison control center or emergency room at once. NOTE: This medicine is only for you. Do not share this medicine with others. What if I miss a dose? If you miss a dose, take it as soon as you can. If it is almost time for your next dose, talk to your care team. You may need to miss a dose or take an extra dose. Do not take double or extra doses without advice. What may interact with this medication? Do not take this medication with any of the following: Alefacept Echinacea Live virus vaccines Metyrapone Mifepristone This medication may also interact with the following: Amphotericin B Aspirin and aspirin-like medications Certain antibiotics, such as erythromycin, clarithromycin, troleandomycin Certain medications for diabetes Certain medications for fungal infections, such as ketoconazole Certain medications for seizures, such as carbamazepine, phenobarbital, phenytoin Certain medications that treat or prevent blood clots, such as warfarin Cholestyramine Cyclosporine Digoxin Diuretics Estrogen or progestin hormones Isoniazid NSAIDs, medications for pain and inflammation, such as ibuprofen or naproxen Other medications for myasthenia gravis Rifampin Vaccines This list may not describe all possible interactions. Give your health care provider a list of all the medicines, herbs, non-prescription drugs, or dietary supplements you use. Also tell them if you smoke, drink alcohol, or use illegal drugs. Some items may interact with your medicine. What should I watch for while using this medication? Tell your care team if your symptoms do not start to get better or if they get worse. Do not stop taking except on your care team's advice. You may develop a severe reaction. Your care team will tell you how much medication to  take. This medication may increase your risk of getting an infection.  Tell your care team if you are around anyone with measles or chickenpox, or if you develop sores or blisters that do not heal properly. This medication may increase blood sugar levels. Ask your care team if changes in diet or medications are needed if you have diabetes. Tell your care team right away if you have any change in your eyesight. Using this medication for a long time may increase your risk of low bone mass. Talk to your care team about bone health. What side effects may I notice from receiving this medication? Side effects that you should report to your care team as soon as possible: Allergic reactions--skin rash, itching, hives, swelling of the face, lips, tongue, or throat Cushing syndrome--increased fat around the midsection, upper back, neck, or face, pink or purple stretch marks on the skin, thinning, fragile skin that easily bruises, unexpected hair growth High blood sugar (hyperglycemia)--increased thirst or amount of urine, unusual weakness or fatigue, blurry vision Increase in blood pressure Infection--fever, chills, cough, sore throat, wounds that don't heal, pain or trouble when passing urine, general feeling of discomfort or being unwell Low adrenal gland function--nausea, vomiting, loss of appetite, unusual weakness or fatigue, dizziness Mood and behavior changes--anxiety, nervousness, confusion, hallucinations, irritability, hostility, thoughts of suicide or self-harm, worsening mood, feelings of depression Stomach bleeding--bloody or black, tar-like stools, vomiting blood or brown material that looks like coffee grounds Swelling of the ankles, hands, or feet Side effects that usually do not require medical attention (report to your care team if they continue or are bothersome): Acne General discomfort and fatigue Headache Increase in appetite Nausea Trouble sleeping Weight gain This list may not describe all possible side effects. Call your doctor for medical advice  about side effects. You may report side effects to FDA at 1-800-FDA-1088. Where should I keep my medication? Keep out of the reach of children and pets. Store at room temperature between 20 and 25 degrees C (68 and 77 degrees F). Throw away any unused medication after the expiration date. NOTE: This sheet is a summary. It may not cover all possible information. If you have questions about this medicine, talk to your doctor, pharmacist, or health care provider.  2023 Elsevier/Gold Standard (2020-11-27 00:00:00)   Flexeril- discussed risks and sedation, do not take with other sedating drugs  Cyclobenzaprine Tablets What is this medication? CYCLOBENZAPRINE (sye kloe BEN za preen) treats muscle spasms. It works by relaxing your muscles, which reduces muscle stiffness. It belongs to a group of medications called muscle relaxants. This medicine may be used for other purposes; ask your health care provider or pharmacist if you have questions. COMMON BRAND NAME(S): Fexmid, Flexeril What should I tell my care team before I take this medication? They need to know if you have any of these conditions: Heart disease, irregular heartbeat, or previous heart attack Liver disease Thyroid problem An unusual or allergic reaction to cyclobenzaprine, tricyclic antidepressants, lactose, other medications, foods, dyes, or preservatives Pregnant or trying to get pregnant Breastfeeding How should I use this medication? Take this medication by mouth with a glass of water. Take it as directed on the prescription label. You can take it with or without food. If it upsets your stomach, take it with food. Do not take it more often than directed. Talk to your care team about the use of this medication in children. Special care may be needed. Overdosage: If you think you have taken  too much of this medicine contact a poison control center or emergency room at once. NOTE: This medicine is only for you. Do not share this  medicine with others. What if I miss a dose? If you miss a dose, take it as soon as you can. If it is almost time for your next dose, take only that dose. Do not take double or extra doses. What may interact with this medication? Do not take this medication with any of the following: MAOIs, such as Carbex, Eldepryl, Marplan, Nardil, and Parnate Opioid medications for cough Safinamide This medication may also interact with the following: Alcohol Bupropion Antihistamines for allergy, cough, and cold Certain medications for anxiety or sleep Certain medications for bladder problems, such as oxybutynin, tolterodine Certain medications for depression, such as amitriptyline, fluoxetine, sertraline Certain medications for Parkinson disease, such as benztropine, trihexyphenidyl Certain medications for seizures, such as phenobarbital, primidone Certain medications for stomach problems, such as dicyclomine, hyoscyamine Certain medications for travel sickness, such as scopolamine General anesthetics, such as halothane, isoflurane, methoxyflurane, propofol Ipratropium Local anesthetics, such as lidocaine, pramoxine, tetracaine Medications that relax muscles for surgery Opioid medications for pain Phenothiazines, such as chlorpromazine, mesoridazine, prochlorperazine, thioridazine Verapamil This list may not describe all possible interactions. Give your health care provider a list of all the medicines, herbs, non-prescription drugs, or dietary supplements you use. Also tell them if you smoke, drink alcohol, or use illegal drugs. Some items may interact with your medicine. What should I watch for while using this medication? Visit your care team for regular checks on your progress. Tell your care team if your symptoms do not start to get better or if they get worse. This medication may affect your coordination, reaction time, or judgment. Do not drive or operate machinery until you know how this  medication affects you. Sit up or stand slowly to reduce the risk of dizzy or fainting spells. Drinking alcohol with this medication can increase the risk of these side effects. Taking this medication with other substances that cause drowsiness, such as alcohol, benzodiazepines, or other opioids can cause serious side effects. Give your care team a list of all medications you use. They will tell you how much medication to take. Do not take more medication than directed. Call emergency services if you have problems breathing or staying awake. Your mouth may get dry. Chewing sugarless gum or sucking hard candy and drinking plenty of water may help. Contact your care team if the problem does not go away or is severe. What side effects may I notice from receiving this medication? Side effects that you should report to your care team as soon as possible: Allergic reactions--skin rash, itching, hives, swelling of the face, lips, tongue, or throat CNS depression--slow or shallow breathing, shortness of breath, feeling faint, dizziness, confusion, trouble staying awake Heart rhythm changes--fast or irregular heartbeat, dizziness, feeling faint or lightheaded, chest pain, trouble breathing Side effects that usually do not require medical attention (report to your care team if they continue or are bothersome): Constipation Dizziness Drowsiness Dry mouth Fatigue Nausea This list may not describe all possible side effects. Call your doctor for medical advice about side effects. You may report side effects to FDA at 1-800-FDA-1088. Where should I keep my medication? Keep out of the reach of children and pets. Store at room temperature between 20 and 25 degrees C (68 and 77 degrees F). Keep container tightly closed. Get rid of any unused medication after the expiration date. To get  rid of medications that are no longer needed or have expired: Take the medication to a medication take-back program. Check with  your pharmacy or law enforcement to find a location. If you cannot return the medication, check the label or package insert to see if the medication should be thrown out in the garbage or flushed down the toilet. If you are not sure, ask your care team. If it is safe to put it in the trash, empty the medication out of the container. Mix the medication with cat litter, dirt, coffee grounds, or other unwanted substance. Seal the mixture in a bag or container. Put it in the trash. NOTE: This sheet is a summary. It may not cover all possible information. If you have questions about this medicine, talk to your doctor, pharmacist, or health care provider.  2023 Elsevier/Gold Standard (2022-03-06 00:00:00)  Gabapentin Capsules or Tablets What is this medication? GABAPENTIN (GA ba pen tin) treats nerve pain. It may also be used to prevent and control seizures in people with epilepsy. It works by calming overactive nerves in your body. This medicine may be used for other purposes; ask your health care provider or pharmacist if you have questions. COMMON BRAND NAME(S): Active-PAC with Gabapentin, Orpha Bur, Gralise, Neurontin What should I tell my care team before I take this medication? They need to know if you have any of these conditions: Alcohol or substance use disorder Kidney disease Lung or breathing disease Suicidal thoughts, plans, or attempt; a previous suicide attempt by you or a family member An unusual or allergic reaction to gabapentin, other medications, foods, dyes, or preservatives Pregnant or trying to get pregnant Breast-feeding How should I use this medication? Take this medication by mouth with a glass of water. Follow the directions on the prescription label. You can take it with or without food. If it upsets your stomach, take it with food. Take your medication at regular intervals. Do not take it more often than directed. Do not stop taking except on your care team's advice. If you  are directed to break the 600 or 800 mg tablets in half as part of your dose, the extra half tablet should be used for the next dose. If you have not used the extra half tablet within 28 days, it should be thrown away. A special MedGuide will be given to you by the pharmacist with each prescription and refill. Be sure to read this information carefully each time. Talk to your care team about the use of this medication in children. While this medication may be prescribed for children as young as 3 years for selected conditions, precautions do apply. Overdosage: If you think you have taken too much of this medicine contact a poison control center or emergency room at once. NOTE: This medicine is only for you. Do not share this medicine with others. What if I miss a dose? If you miss a dose, take it as soon as you can. If it is almost time for your next dose, take only that dose. Do not take double or extra doses. What may interact with this medication? Alcohol Antihistamines for allergy, cough, and cold Certain medications for anxiety or sleep Certain medications for depression like amitriptyline, fluoxetine, sertraline Certain medications for seizures like phenobarbital, primidone Certain medications for stomach problems General anesthetics like halothane, isoflurane, methoxyflurane, propofol Local anesthetics like lidocaine, pramoxine, tetracaine Medications that relax muscles for surgery Opioid medications for pain Phenothiazines like chlorpromazine, mesoridazine, prochlorperazine, thioridazine This list may not describe  all possible interactions. Give your health care provider a list of all the medicines, herbs, non-prescription drugs, or dietary supplements you use. Also tell them if you smoke, drink alcohol, or use illegal drugs. Some items may interact with your medicine. What should I watch for while using this medication? Visit your care team for regular checks on your progress. You may  want to keep a record at home of how you feel your condition is responding to treatment. You may want to share this information with your care team at each visit. You should contact your care team if your seizures get worse or if you have any new types of seizures. Do not stop taking this medication or any of your seizure medications unless instructed by your care team. Stopping your medication suddenly can increase your seizures or their severity. This medication may cause serious skin reactions. They can happen weeks to months after starting the medication. Contact your care team right away if you notice fevers or flu-like symptoms with a rash. The rash may be red or purple and then turn into blisters or peeling of the skin. Or, you might notice a red rash with swelling of the face, lips or lymph nodes in your neck or under your arms. Wear a medical identification bracelet or chain if you are taking this medication for seizures. Carry a card that lists all your medications. This medication may affect your coordination, reaction time, or judgment. Do not drive or operate machinery until you know how this medication affects you. Sit up or stand slowly to reduce the risk of dizzy or fainting spells. Drinking alcohol with this medication can increase the risk of these side effects. Your mouth may get dry. Chewing sugarless gum or sucking hard candy, and drinking plenty of water may help. Watch for new or worsening thoughts of suicide or depression. This includes sudden changes in mood, behaviors, or thoughts. These changes can happen at any time but are more common in the beginning of treatment or after a change in dose. Call your care team right away if you experience these thoughts or worsening depression. If you become pregnant while using this medication, you may enroll in the [place] Pregnancy Registry by calling [phone]. This registry collects information about the safety  of antiepileptic medication use during pregnancy. What side effects may I notice from receiving this medication? Side effects that you should report to your care team as soon as possible: Allergic reactions or angioedema--skin rash, itching, hives, swelling of the face, eyes, lips, tongue, arms, or legs, trouble swallowing or breathing Rash, fever, and swollen lymph nodes Thoughts of suicide or self harm, worsening mood, feelings of depression Trouble breathing Unusual changes in mood or behavior in children after use such as difficulty concentrating, hostility, or restlessness Side effects that usually do not require medical attention (report to your care team if they continue or are bothersome): Dizziness Drowsiness Nausea Swelling of ankles, feet, or hands Vomiting This list may not describe all possible side effects. Call your doctor for medical advice about side effects. You may report side effects to FDA at 1-800-FDA-1088. Where should I keep my medication? Keep out of reach of children and pets. Store at room temperature between 15 and 30 degrees C (59 and 86 degrees F). Get rid of any unused medication after the expiration date. This medication may cause accidental overdose and death if taken by other adults, children, or pets. To get rid of medications that are  no longer needed or have expired: Take the medication to a medication take-back program. Check with your pharmacy or law enforcement to find a location. If you cannot return the medication, check the label or package insert to see if the medication should be thrown out in the garbage or flushed down the toilet. If you are not sure, ask your care team. If it is safe to put it in the trash, empty the medication out of the container. Mix the medication with cat litter, dirt, coffee grounds, or other unwanted substance. Seal the mixture in a bag or container. Put it in the trash. NOTE: This sheet is a summary. It may not cover all  possible information. If you have questions about this medicine, talk to your doctor, pharmacist, or health care provider.  2023 Elsevier/Gold Standard (2021-03-04 00:00:00)

## 2022-08-19 NOTE — Progress Notes (Signed)
Botox- 200 units x 1 vial Lot: [id number] Expiration: 12/2024 NDC: 3142-7670-11  Bacteriostatic 0.9% Sodium Chloride- 32m total Lot: [id number]: 05/17/2023 NDC: [address]-12 Dx: GQ58.346B/B

## 2022-09-12 ENCOUNTER — Encounter: Payer: Self-pay | Admitting: Nurse Practitioner

## 2022-09-12 ENCOUNTER — Telehealth (INDEPENDENT_AMBULATORY_CARE_PROVIDER_SITE_OTHER): Payer: Medicare PPO | Admitting: Nurse Practitioner

## 2022-09-12 VITALS — Ht 65.0 in | Wt 170.0 lb

## 2022-09-12 DIAGNOSIS — J069 Acute upper respiratory infection, unspecified: Secondary | ICD-10-CM | POA: Diagnosis not present

## 2022-09-12 DIAGNOSIS — B9689 Other specified bacterial agents as the cause of diseases classified elsewhere: Secondary | ICD-10-CM

## 2022-09-12 MED ORDER — PREDNISONE 20 MG PO TABS: 40.0000 mg | ORAL_TABLET | Freq: Every day | ORAL | 0 refills | Status: DC

## 2022-09-12 MED ORDER — CEPHALEXIN 500 MG PO CAPS: 500.0000 mg | ORAL_CAPSULE | Freq: Two times a day (BID) | ORAL | 0 refills | Status: DC

## 2022-09-12 MED ORDER — BENZONATATE 200 MG PO CAPS: 200.0000 mg | ORAL_CAPSULE | Freq: Three times a day (TID) | ORAL | 0 refills | Status: DC | PRN

## 2022-09-12 NOTE — Progress Notes (Unsigned)
Virtual Visit Encounter {telephone/mychart:25033} visit.   I connected with  [name] Nolan on 09/12/22 at  3:30 PM EST by secure {Video Enabled:26378::"video and audio"} telemedicine application. I verified that I am speaking with the correct person using two identifiers.   I introduced myself as a Designer, jewellery with the practice. The limitations of evaluation and management by telemedicine discussed with the patient and the availability of in person appointments. The patient expressed verbal understanding and consent to proceed.  Participating parties in this visit include: Myself and {Relatives of adult:5061}  The patient is: {Patient Location:[phone]::"Home"} I am: {Provider Location:[phone]::"Home Office"} Subjective:    CC and HPI: [name] Nolan is a 76 y.o. year old female presenting for {New/followup:15353} ***. Patient reports the following: Cough, congestion, sore ribs    Past medical history, Surgical history, Family history not pertinant except as noted below, Social history, Allergies, and medications have been entered into the medical record, reviewed, and corrections made.   Review of Systems:  All review of systems negative except what is listed in the HPI  Objective:    Alert and oriented x *** Speaking in clear sentences with no shortness of breath. No distress.  Impression and Recommendations:    Problem List Items Addressed This Visit   None   {disease follow up plans:315751} I discussed the assessment and treatment plan with the patient. The patient was provided an opportunity to ask questions and all were answered. The patient agreed with the plan and demonstrated an understanding of the instructions.   The patient was advised to call back or seek an in-person evaluation if the symptoms worsen or if the condition fails to improve as anticipated.  Follow-Up: {plan; follow-up:11812}  I provided *** minutes of non-face-to-face  interaction with this non face-to-face encounter including intake, same-day documentation, and chart review.   Orma Render, NP , DNP, AGNP-c [place] [facility] [phone] (fax)

## 2022-09-17 DIAGNOSIS — B9689 Other specified bacterial agents as the cause of diseases classified elsewhere: Secondary | ICD-10-CM

## 2022-09-17 HISTORY — DX: Other specified bacterial agents as the cause of diseases classified elsewhere: B96.89

## 2022-09-17 NOTE — Assessment & Plan Note (Signed)
Symptoms and presentation consistent with URI and suspected sinusitis. Given the length of time symptoms have been present and the upcoming long holiday weekend, I will send treatment today with antibiotic therapy. No alarm symptoms are present at this time. She will follow up if she continues to have symptoms after treatment completion.

## 2022-10-02 ENCOUNTER — Telehealth: Payer: Self-pay | Admitting: Neurology

## 2022-10-02 NOTE — Telephone Encounter (Signed)
Please call patient and ask her to provide her 2024 insurance information to [place]. She can take a picture of the front and back of her card and send to [place] through mychart or at least for now, provide [place] with her ID number, group number, and if she has any prescription drug coverage information on the card (Rx ID, BIN, PCN, Group) we need that as well in addition to the provider phone number on the back of the card for pre-certifications.

## 2022-10-02 NOTE — Telephone Encounter (Signed)
Pt is on the schedule for botox injection for 11/11/22 and will need a new PA before appointment. Prior PA expired 09/14/22

## 2022-10-22 NOTE — Telephone Encounter (Signed)
Sent msg asking pt to send [place] new insurance card

## 2022-10-22 NOTE — Telephone Encounter (Signed)
Pt's insurance verified in epic, please initiate new PA for botox. Thank you!

## 2022-10-24 ENCOUNTER — Other Ambulatory Visit (HOSPITAL_COMMUNITY): Payer: Self-pay

## 2022-10-24 NOTE — Telephone Encounter (Signed)
Pharmacy Patient Advocate Encounter   Received notification from [place] that prior authorization for Botox 200UNIT solution is required/requested.    PA submitted on 10/24/2022 to (ins) [employer] via [place] Status is pending

## 2022-10-24 NOTE — Telephone Encounter (Signed)
Botox One-Benefit Verification [id number] Submitted!

## 2022-10-28 ENCOUNTER — Other Ambulatory Visit (HOSPITAL_COMMUNITY): Payer: Self-pay

## 2022-10-28 NOTE — Telephone Encounter (Signed)
Pharmacy Patient Advocate Encounter  Prior Authorization for Botox 200UNIT solution has been approved.    PA# PA Case ID: [id number] Effective dates: 10/05/2019 through 09/15/2023  This will be buy and [employer]

## 2022-11-11 ENCOUNTER — Ambulatory Visit: Payer: Medicare PPO | Admitting: Neurology

## 2022-11-11 DIAGNOSIS — G43719 Chronic migraine without aura, intractable, without status migrainosus: Secondary | ICD-10-CM | POA: Diagnosis not present

## 2022-11-11 MED ORDER — ONABOTULINUMTOXINA 200 UNITS IJ SOLR
155.0000 [IU] | Freq: Once | INTRAMUSCULAR | Status: AC
  Administered 2022-11-11: 155 [IU] via INTRAMUSCULAR

## 2022-11-11 NOTE — Progress Notes (Signed)
Consent Form Botulism Toxin Injection For Chronic Migraine  11/11/2022: stable  08/19/2022: stable: Doing extremely well at least 70% improvement continues in migraine frequency. 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  05/27/2022: stable, doing well 03/02/2022: Stable but under lots of stress 12/07/2021: Stable 09/03/2021 stable: Doing extremely well at least 70% improvement continues in migraine frequency. 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   Reviewed orally with patient, additionally signature is on file:  Botulism toxin has been approved by the Federal drug administration for treatment of chronic migraine. Botulism toxin does not cure chronic migraine and it may not be effective in some patients.  The administration of botulism toxin is accomplished by injecting a small amount of toxin into the muscles of the neck and head. Dosage must be titrated for each individual. Any benefits resulting from botulism toxin tend to wear off after 3 months with a repeat injection required if benefit is to be maintained. Injections are usually done every 3-4 months with maximum effect peak achieved by about 2 or 3 weeks. Botulism toxin is expensive and you should be sure of what costs you will incur resulting from the injection.  The side effects of botulism toxin use for chronic migraine may include:   -Transient, and usually mild, facial weakness with facial injections  -Transient, and usually mild, head or neck weakness with head/neck injections  -Reduction or loss of forehead facial animation due to forehead muscle weakness  -Eyelid drooping  -Dry eye  -Pain at the site of injection or bruising at the site of injection  -Double vision  -Potential unknown long term risks  Contraindications: You should not have Botox if you are pregnant, nursing, allergic to albumin, have an infection, skin condition, or muscle weakness at the site of the injection, or have myasthenia gravis, Lambert-Eaton syndrome, or ALS.  It is also possible  that as with any injection, there may be an allergic reaction or no effect from the medication. Reduced effectiveness after repeated injections is sometimes seen and rarely infection at the injection site may occur. All care will be taken to prevent these side effects. If therapy is given over a long time, atrophy and wasting in the muscle injected may occur. Occasionally the patient's become refractory to treatment because they develop antibodies to the toxin. In this event, therapy needs to be modified.  I have read the above information and consent to the administration of botulism toxin.    BOTOX PROCEDURE NOTE FOR MIGRAINE HEADACHE    Contraindications and precautions discussed with patient(above). Aseptic procedure was observed and patient tolerated procedure. Procedure performed by Dr. Georgia Dom  The condition has existed for more than 6 months, and pt does not have a diagnosis of ALS, Myasthenia Gravis or Lambert-Eaton Syndrome.  Risks and benefits of injections discussed and pt agrees to proceed with the procedure.  Written consent obtained  These injections are medically necessary. Pt  receives good benefits from these injections. These injections do not cause sedations or hallucinations which the oral therapies may cause.  Description of procedure:  The patient was placed in a sitting position. The standard protocol was used for Botox as follows, with 5 units of Botox injected at each site:   -Procerus muscle, midline injection  -Corrugator muscle, bilateral injection  -Frontalis muscle, bilateral injection, with 2 sites each side, medial injection was performed in the upper one third of the frontalis muscle, in the region vertical from the medial inferior edge of the superior orbital rim. The lateral injection  was again in the upper one third of the forehead vertically above the lateral limbus of the cornea, 1.5 cm lateral to the medial injection site.  -Temporalis muscle  injection, 4 sites, bilaterally. The first injection was 3 cm above the tragus of the ear, second injection site was 1.5 cm to 3 cm up from the first injection site in line with the tragus of the ear. The third injection site was 1.5-3 cm forward between the first 2 injection sites. The fourth injection site was 1.5 cm posterior to the second injection site.   -Occipitalis muscle injection, 3 sites, bilaterally. The first injection was done one half way between the occipital protuberance and the tip of the mastoid process behind the ear. The second injection site was done lateral and superior to the first, 1 fingerbreadth from the first injection. The third injection site was 1 fingerbreadth superiorly and medially from the first injection site.  -Cervical paraspinal muscle injection, 2 sites, bilateral knee first injection site was 1 cm from the midline of the cervical spine, 3 cm inferior to the lower border of the occipital protuberance. The second injection site was 1.5 cm superiorly and laterally to the first injection site.  -Trapezius muscle injection was performed at 3 sites, bilaterally. The first injection site was in the upper trapezius muscle halfway between the inflection point of the neck, and the acromion. The second injection site was one half way between the acromion and the first injection site. The third injection was done between the first injection site and the inflection point of the neck.   Will return for repeat injection in 3 months.   155 units of Botox was used, 45u Botox not injected was wasted. The patient tolerated the procedure well, there were no complications of the above procedure.

## 2022-11-11 NOTE — Progress Notes (Signed)
Botox- 200 units x 1 vial Lot: [id number] Expiration: 02/2025 NDC: [id number]  Bacteriostatic 0.9% Sodium Chloride- 39m total Lot: [id number]: 07/2024 NDC: [id number] Dx: [id number] B/B

## 2022-11-19 ENCOUNTER — Other Ambulatory Visit: Payer: Self-pay | Admitting: Neurology

## 2022-12-16 ENCOUNTER — Telehealth: Payer: Self-pay | Admitting: Nurse Practitioner

## 2022-12-16 NOTE — Telephone Encounter (Signed)
Contacted [name] Nolan to schedule their annual wellness visit. Appointment made for 12/23/22.  [name] Nolan [phone]

## 2022-12-23 ENCOUNTER — Ambulatory Visit (INDEPENDENT_AMBULATORY_CARE_PROVIDER_SITE_OTHER): Payer: Medicare PPO

## 2022-12-23 VITALS — Ht 65.0 in | Wt 170.0 lb

## 2022-12-23 DIAGNOSIS — Z0000 Encounter for general adult medical examination without abnormal findings: Secondary | ICD-10-CM | POA: Diagnosis not present

## 2022-12-23 NOTE — Progress Notes (Signed)
Subjective:   [name] Nolan is a 77 y.o. female who presents for Medicare Annual (Subsequent) preventive examination.  I connected with  [name] Nolan on 12/23/22 by a audio enabled telemedicine application and verified that I am speaking with the correct person using two identifiers.  Patient Location: Home  Provider Location: Office/Clinic  I discussed the limitations of evaluation and management by telemedicine. The patient expressed understanding and agreed to proceed.  Review of Systems     Cardiac Risk Factors include: advanced age (>28men, >34 women);dyslipidemia     Objective:    Today's Vitals   12/23/22 1525  Weight: 170 lb (77.1 kg)  Height: 5\' 5"  (1.651 m)   Body mass index is 28.29 kg/m.     12/23/2022    3:17 PM 01/06/2022    6:14 PM 01/06/2022    7:11 AM 01/04/2022   12:47 PM 11/03/2021    5:23 PM 03/21/2021   12:05 PM 03/17/2021    5:43 PM  Advanced Directives  Does Patient Have a Medical Advance Directive? Yes  No Yes No Yes Yes  Type of Advance Directive Living will;Healthcare Power of [employer] Power of [place];Living will  Living will Living will  Does patient want to make changes to medical advance directive? No - Patient declined     No - Patient declined No - Patient declined  Copy of Healthcare Power of Attorney in Chart? Yes - validated most recent copy scanned in chart (See row information)   No - copy requested  No - copy requested   Would patient like information on creating a medical advance directive?  No - Patient declined         Current Medications (verified) Outpatient Encounter Medications as of 12/23/2022  Medication Sig   acetaminophen (TYLENOL) 325 MG tablet Take 2 tablets (650 mg total) by mouth every 6 (six) hours as needed for mild pain (or Fever >/= 101).   ALPRAZolam (XANAX) 0.5 MG tablet Take 1 tablet (0.5 mg total) by mouth at bedtime as needed for anxiety.   aspirin-acetaminophen-caffeine (EXCEDRIN  MIGRAINE) 250-250-65 MG tablet Take by mouth every 6 (six) hours as needed for headache.   cyclobenzaprine (FLEXERIL) 5 MG tablet Take 1 tablet (5 mg total) by mouth 3 (three) times daily as needed for muscle spasms.   gabapentin (NEURONTIN) 300 MG capsule Take 4 capsules (1,200 mg total) by mouth at bedtime.   levothyroxine (SYNTHROID) 50 MCG tablet Take 1 tablet (50 mcg total) by mouth daily.   venlafaxine XR (EFFEXOR-XR) 75 MG 24 hr capsule TAKE 1 CAPSULE BY MOUTH DAILY WITH BREAKFAST.   No facility-administered encounter medications on file as of 12/23/2022.    Allergies (verified) Statins and Codeine   History: Past Medical History:  Diagnosis Date   Absolute anemia 11/17/2017   Acute hyperglycemia 11/04/2021   Acute non-recurrent pansinusitis 09/26/2021   Anxiety and depression 05/07/2014   Widowed in 2013 after caring for her husband with Lewy Body Dementia for 6 years    Basal cell carcinoma of right ear 02/26/2015   Removed by Dr Hortense Ramal   Chronic back pain    "mid-back; stops at the very lowest part of my back" (11/07/2015)   Dysphagia, pharyngoesophageal phase 12/13/2014   Esophageal reflux    occ   Excessive daytime sleepiness 01/25/2015   Hashimoto's disease    Heart murmur    Hot flashes 05/27/2016   Hyperlipidemia    Hypokalemia 11/04/2021   Hypothyroid  Insomnia    Joint pain    Low ferritin 08/27/2015   "took supplements for awhile" (11/07/2015)   Migraine    "under control w/daily RX right now" (11/07/2015)   Occipital neuralgia    Osteopenia 02/26/2015   Pneumonia    PONV (postoperative nausea and vomiting) 1974   after cholecystectomy   Prolonged QT interval 11/04/2021   Skin cancer 02/26/2015   Right ear Removed by Dr Hortense Ramal   Vitamin B12 deficiency 09/01/2016   Vitamin D deficiency 03/02/2017   Past Surgical History:  Procedure Laterality Date   APPENDECTOMY  11/07/2015   BASAL CELL CARCINOMA EXCISION Right 02/26/2015   ear   CATARACT EXTRACTION  Bilateral    CHOLECYSTECTOMY OPEN  09/15/1972   COLONOSCOPY  09/15/2004   LAPAROSCOPIC APPENDECTOMY N/A 11/07/2015   Procedure: APPENDECTOMY LAPAROSCOPIC;  Surgeon: Violeta Gelinas, MD;  Location[facility] OR;  Service: General;  Laterality: N/A;   LAPAROSCOPIC INCISIONAL / UMBILICAL / VENTRAL HERNIA REPAIR  11/07/2015   UHR   LUMBAR LAMINECTOMY/DECOMPRESSION MICRODISCECTOMY N/A 03/21/2021   Procedure: Microlumbar decompression Lumbar four-five Central;  Surgeon: Jene Every, MD;  Location: [facility] OR;  Service: Orthopedics;  Laterality: N/A;   Tooth implant     at least 5 years ago per pt   TUBAL LIGATION  09/16/1971   UMBILICAL HERNIA REPAIR N/A 11/07/2015   Procedure: LAPAROSCOPIC UMBILICAL HERNIA;  Surgeon: Violeta Gelinas, MD;  Location: [facility] OR;  Service: General;  Laterality: N/A;   Family History  Problem Relation Age of Onset   Congestive Heart Failure Mother    Hypertension Mother    Hyperlipidemia Mother    Heart disease Mother    Obesity Mother    Leukemia Father    Obesity Father    Kidney disease Brother    Cancer Brother        stage 4 kidney cancer, metastatic   Kidney cancer Brother    Leukemia Maternal Aunt    Congestive Heart Failure Maternal Grandmother    Arthritis Sister    Colon cancer Neg Hx    Social History   Socioeconomic History   Marital status: Widowed    Spouse name: [name] Nolan "[name] November" Nolan   Number of children: 1   Years of education: HS   Highest education level: Not on file  Occupational History   Occupation: Retired  Tobacco Use   Smoking status: Never   Smokeless tobacco: Never  Vaping Use   Vaping Use: Never used  Substance and Sexual Activity   Alcohol use: No    Alcohol/week: 0.0 standard drinks of alcohol   Drug use: No   Sexual activity: Yes    Birth control/protection: Post-menopausal    Comment: lives with husband, no dietary restrictions, avoids caffeine, bananas, dairy  Other Topics Concern   Not on file  Social History Narrative    Patient is married [name] Nolan) and lives at home with her husband.   Patient has one child.   Patient has a high school education.   Patient is right-handed.   Caffeine Use: Occasionally   Social Determinants of Health   Financial Resource Strain: Low Risk  (12/23/2022)   Overall Financial Resource Strain (CARDIA)    Difficulty of Paying Living Expenses: Not hard at all  Food Insecurity: No Food Insecurity (12/23/2022)   Hunger Vital Sign    Worried About Running Out of Food in the Last Year: Never true    Ran Out of Food in the Last Year: Never true  Transportation Needs:  No Transportation Needs (12/23/2022)   PRAPARE - Administrator, Civil ServiceTransportation    Lack of Transportation (Medical): No    Lack of Transportation (Non-Medical): No  Physical Activity: Sufficiently Active (12/23/2022)   Exercise Vital Sign    Days of Exercise per Week: 7 days    Minutes of Exercise per Session: 90 min  Stress: No Stress Concern Present (12/23/2022)   [employer] - Occupational Stress Questionnaire    Feeling of Stress : Not at all  Social Connections: Socially Isolated (12/23/2022)   Social Connection and Isolation Panel [NHANES]    Frequency of Communication with Friends and Family: More than three times a week    Frequency of Social Gatherings with Friends and Family: Once a week    Attends Religious Services: Never    Database administratorActive Member of Clubs or Organizations: No    Attends BankerClub or Organization Meetings: Never    Marital Status: Widowed    Tobacco Counseling Counseling given: Not Answered   Clinical Intake:  Pre-visit preparation completed: Yes  Pain : No/denies pain  Diabetes: No  How often do you need to have someone help you when you read instructions, pamphlets, or other written materials from your doctor or pharmacy?: 1 - Never  Diabetic?No   Interpreter Needed?: No  Information entered by :: Kandis Fantasiaourtney Kamar Callender LPN   Activities of Daily Living    12/23/2022    3:16 PM  12/19/2022    4:09 PM  In your present state of health, do you have any difficulty performing the following activities:  Hearing? 0 0  Vision? 0 0  Difficulty concentrating or making decisions? 0 0  Walking or climbing stairs? 0 0  Dressing or bathing? 0 0  Doing errands, shopping? 0 0  Preparing Food and eating ? N N  Using the Toilet? N N  In the past six months, have you accidently leaked urine? N N  Do you have problems with loss of bowel control? N N  Managing your Medications? N N  Managing your Finances? N N  Housekeeping or managing your Housekeeping? N N    Patient Care Team: Early, Sung AmabileSara E, NP as PCP - General (Nurse Practitioner) Anson FretAhern, Antonia B, MD as Consulting Physician (Neurology)  Indicate any recent Medical Services you may have received from other than [facility] providers in the past year (date may be approximate).     Assessment:   This is a routine wellness examination for [place].  Hearing/Vision screen Hearing Screening - Comments:: Denies hearing difficulties  Vision Screening - Comments:: up to date with routine eye exams [facility]     Dietary issues and exercise activities discussed: Current Exercise Habits: Home exercise routine   Goals Addressed             This Visit's Progress    Remain active and independent        Depression Screen    12/23/2022    3:30 PM 02/28/2021    3:13 PM 01/31/2021    1:16 PM 02/28/2020    1:56 PM 02/14/2020    3:28 PM 09/06/2018    2:10 PM 07/27/2018    5:43 PM  PHQ 2/9 Scores  PHQ - 2 Score 0 2 1 1 4 1 4   PHQ- 9 Score  6 1  7  14     Fall Risk    12/23/2022    3:16 PM 12/19/2022    4:09 PM 02/28/2021    3:13 PM 01/31/2021  1:17 PM 02/28/2020    1:56 PM  Fall Risk   Falls in the past year? 0 0 0 0 0  Number falls in past yr: 0 0 0 0 0  Injury with Fall? 0 0 0 0 0  Risk for fall due to : No Fall Risks  No Fall Risks    Follow up Falls prevention discussed;Education provided;Falls evaluation  completed  Falls evaluation completed  Education provided;Falls prevention discussed    FALL RISK PREVENTION PERTAINING TO THE HOME:  Any stairs in or around the home? No  If so, are there any without handrails? No  Home free of loose throw rugs in walkways, pet beds, electrical cords, etc? Yes  Adequate lighting in your home to reduce risk of falls? Yes   ASSISTIVE DEVICES UTILIZED TO PREVENT FALLS:  Life alert? No  Use of a cane, walker or w/c? No  Grab bars in the bathroom? Yes  Shower chair or bench in shower? No  Elevated toilet seat or a handicapped toilet? Yes   TIMED UP AND GO:  Was the test performed? No . Telephonic visit   Cognitive Function:    09/01/2016    2:58 PM  MMSE - Mini Mental State Exam  Orientation to time 5  Orientation to Place 5  Registration 3  Attention/ Calculation 5  Recall 3  Language- name 2 objects 2  Language- repeat 1  Language- follow 3 step command 3  Language- read & follow direction 1  Write a sentence 1  Copy design 1  Total score 30        12/23/2022    3:17 PM  6CIT Screen  What Year? 0 points  What month? 0 points  What time? 0 points  Count back from 20 0 points  Months in reverse 0 points  Repeat phrase 0 points  Total Score 0 points    Immunizations Immunization History  Administered Date(s) Administered   Fluad Quad(high Dose 65+) 06/22/2019   Influenza, High Dose Seasonal PF 06/24/2017, 06/24/2017, 07/27/2018   Influenza,inj,Quad PF,6+ Mos 05/29/2015   Influenza,inj,quad, With Preservative 06/22/2019   Influenza-Unspecified 05/16/2014, 04/24/2016, 06/20/2020   Pneumococcal Conjugate-13 09/04/2014   Pneumococcal Polysaccharide-23 08/27/2015, 07/07/2019   Pneumococcal-Unspecified 07/07/2019   Tdap 08/24/2014   Zoster Recombinat (Shingrix) 07/07/2019, 12/19/2019   Zoster, Live 08/28/2014    TDAP status: Up to date  Flu Vaccine status: Up to date  Pneumococcal vaccine status: Up to date  Covid-19  vaccine status: Information provided on how to obtain vaccines.   Qualifies for Shingles Vaccine? Yes   Zostavax completed Yes   Shingrix Completed?: Yes  Screening Tests Health Maintenance  Topic Date Due   COVID-19 Vaccine (1) Never done   COLON CANCER SCREENING ANNUAL FOBT  09/17/2017   COLONOSCOPY (Pts 45-37yrs Insurance coverage will need to be confirmed)  10/28/2018   INFLUENZA VACCINE  04/16/2023   Medicare Annual Wellness (AWV)  12/23/2023   DTaP/Tdap/Td (2 - Td or Tdap) 08/24/2024   Pneumonia Vaccine 55+ Years old  Completed   DEXA SCAN  Completed   Hepatitis C Screening  Completed   Zoster Vaccines- Shingrix  Completed   HPV VACCINES  Aged Out    Health Maintenance  Health Maintenance Due  Topic Date Due   COVID-19 Vaccine (1) Never done   COLON CANCER SCREENING ANNUAL FOBT  09/17/2017   COLONOSCOPY (Pts 45-42yrs Insurance coverage will need to be confirmed)  10/28/2018    Colorectal cancer screening: No longer  required.   Mammogram status: No longer required due to age.  Bone Density status: Completed 01/13/18. Results reflect: Bone density results: NORMAL. Repeat every 5 years.  Lung Cancer Screening: (Low Dose CT Chest recommended if Age 49-80 years, 30 pack-year currently smoking OR have quit w/in 15years.) does not qualify.   Lung Cancer Screening Referral: n/a  Additional Screening:  Hepatitis C Screening: does qualify; Completed 08/27/15  Vision Screening: Recommended annual ophthalmology exams for early detection of glaucoma and other disorders of the eye. Is the patient up to date with their annual eye exam?  Yes  Who is the provider or what is the name of the office in which the patient attends annual eye exams? [facility]  If pt is not established with a provider, would they like to be referred to a provider to establish care? No .   Dental Screening: Recommended annual dental exams for proper oral hygiene  Community Resource Referral /  Chronic Care Management: CRR required this visit?  No   CCM required this visit?  No      Plan:     I have personally reviewed and noted the following in the patient's chart:   Medical and social history Use of alcohol, tobacco or illicit drugs  Current medications and supplements including opioid prescriptions. Patient is not currently taking opioid prescriptions. Functional ability and status Nutritional status Physical activity Advanced directives List of other physicians Hospitalizations, surgeries, and ER visits in previous 12 months Vitals Screenings to include cognitive, depression, and falls Referrals and appointments  In addition, I have reviewed and discussed with patient certain preventive protocols, quality metrics, and best practice recommendations. A written personalized care plan for preventive services as well as general preventive health recommendations were provided to patient.     Durwin Nora, [place]   [id number]   Due to this being a virtual visit, the after visit summary with patients personalized plan was offered to patient via mail or my-chart. Patient would like to access on my-chart  Nurse Notes: No concerns

## 2022-12-23 NOTE — Patient Instructions (Signed)
[name] Nolan , Thank you for taking time to come for your Medicare Wellness Visit. I appreciate your ongoing commitment to your health goals. Please review the following plan we discussed and let me know if I can assist you in the future.   These are the goals we discussed:  Goals       Lose 10 lbs by next year.   (pt-stated)      Remain active and independent        This is a list of the screening recommended for you and due dates:  Health Maintenance  Topic Date Due   COVID-19 Vaccine (1) Never done   Flu Shot  04/16/2023   Medicare Annual Wellness Visit  12/23/2023   DTaP/Tdap/Td vaccine (2 - Td or Tdap) 08/24/2024   Colon Cancer Screening  10/28/2025   Pneumonia Vaccine  Completed   DEXA scan (bone density measurement)  Completed   Hepatitis C Screening: USPSTF Recommendation to screen - Ages 21-79 yo.  Completed   Zoster (Shingles) Vaccine  Completed   HPV Vaccine  Aged Out    Advanced directives: We have a copy of your advanced directives available in your record should your provider ever need to access them.   Conditions/risks identified: Aim for 30 minutes of exercise or brisk walking, 6-8 glasses of water, and 5 servings of fruits and vegetables each day.   Next appointment: Follow up in one year for your annual wellness visit    Preventive Care 65 Years and Older, Female Preventive care refers to lifestyle choices and visits with your health care provider that can promote health and wellness. What does preventive care include? A yearly physical exam. This is also called an annual well check. Dental exams once or twice a year. Routine eye exams. Ask your health care provider how often you should have your eyes checked. Personal lifestyle choices, including: Daily care of your teeth and gums. Regular physical activity. Eating a healthy diet. Avoiding tobacco and drug use. Limiting alcohol use. Practicing safe sex. Taking low-dose aspirin every day. Taking  vitamin and mineral supplements as recommended by your health care provider. What happens during an annual well check? The services and screenings done by your health care provider during your annual well check will depend on your age, overall health, lifestyle risk factors, and family history of disease. Counseling  Your health care provider may ask you questions about your: Alcohol use. Tobacco use. Drug use. Emotional well-being. Home and relationship well-being. Sexual activity. Eating habits. History of falls. Memory and ability to understand (cognition). Work and work Astronomer. Reproductive health. Screening  You may have the following tests or measurements: Height, weight, and BMI. Blood pressure. Lipid and cholesterol levels. These may be checked every 5 years, or more frequently if you are over 72 years old. Skin check. Lung cancer screening. You may have this screening every year starting at age 58 if you have a 30-pack-year history of smoking and currently smoke or have quit within the past 15 years. Fecal occult blood test (FOBT) of the stool. You may have this test every year starting at age 37. Flexible sigmoidoscopy or colonoscopy. You may have a sigmoidoscopy every 5 years or a colonoscopy every 10 years starting at age 87. Hepatitis C blood test. Hepatitis B blood test. Sexually transmitted disease (STD) testing. Diabetes screening. This is done by checking your blood sugar (glucose) after you have not eaten for a while (fasting). You may have this done every 1-3  years. Bone density scan. This is done to screen for osteoporosis. You may have this done starting at age 71. Mammogram. This may be done every 1-2 years. Talk to your health care provider about how often you should have regular mammograms. Talk with your health care provider about your test results, treatment options, and if necessary, the need for more tests. Vaccines  Your health care provider may  recommend certain vaccines, such as: Influenza vaccine. This is recommended every year. Tetanus, diphtheria, and acellular pertussis (Tdap, Td) vaccine. You may need a Td booster every 10 years. Zoster vaccine. You may need this after age 27. Pneumococcal 13-valent conjugate (PCV13) vaccine. One dose is recommended after age 21. Pneumococcal polysaccharide (PPSV23) vaccine. One dose is recommended after age 70. Talk to your health care provider about which screenings and vaccines you need and how often you need them. This information is not intended to replace advice given to you by your health care provider. Make sure you discuss any questions you have with your health care provider. Document Released: 09/28/2015 Document Revised: 05/21/2016 Document Reviewed: 07/03/2015 Elsevier Interactive Patient Education  2017 [employer] Prevention in the Home Falls can cause injuries. They can happen to people of all ages. There are many things you can do to make your home safe and to help prevent falls. What can I do on the outside of my home? Regularly fix the edges of walkways and driveways and fix any cracks. Remove anything that might make you trip as you walk through a door, such as a raised step or threshold. Trim any bushes or trees on the path to your home. Use bright outdoor lighting. Clear any walking paths of anything that might make someone trip, such as rocks or tools. Regularly check to see if handrails are loose or broken. Make sure that both sides of any steps have handrails. Any raised decks and porches should have guardrails on the edges. Have any leaves, snow, or ice cleared regularly. Use sand or salt on walking paths during winter. Clean up any spills in your garage right away. This includes oil or grease spills. What can I do in the bathroom? Use night lights. Install grab bars by the toilet and in the tub and shower. Do not use towel bars as grab bars. Use non-skid  mats or decals in the tub or shower. If you need to sit down in the shower, use a plastic, non-slip stool. Keep the floor dry. Clean up any water that spills on the floor as soon as it happens. Remove soap buildup in the tub or shower regularly. Attach bath mats securely with double-sided non-slip rug tape. Do not have throw rugs and other things on the floor that can make you trip. What can I do in the bedroom? Use night lights. Make sure that you have a light by your bed that is easy to reach. Do not use any sheets or blankets that are too big for your bed. They should not hang down onto the floor. Have a firm chair that has side arms. You can use this for support while you get dressed. Do not have throw rugs and other things on the floor that can make you trip. What can I do in the kitchen? Clean up any spills right away. Avoid walking on wet floors. Keep items that you use a lot in easy-to-reach places. If you need to reach something above you, use a strong step stool that has a grab  bar. Keep electrical cords out of the way. Do not use floor polish or wax that makes floors slippery. If you must use wax, use non-skid floor wax. Do not have throw rugs and other things on the floor that can make you trip. What can I do with my stairs? Do not leave any items on the stairs. Make sure that there are handrails on both sides of the stairs and use them. Fix handrails that are broken or loose. Make sure that handrails are as long as the stairways. Check any carpeting to make sure that it is firmly attached to the stairs. Fix any carpet that is loose or worn. Avoid having throw rugs at the top or bottom of the stairs. If you do have throw rugs, attach them to the floor with carpet tape. Make sure that you have a light switch at the top of the stairs and the bottom of the stairs. If you do not have them, ask someone to add them for you. What else can I do to help prevent falls? Wear shoes  that: Do not have high heels. Have rubber bottoms. Are comfortable and fit you well. Are closed at the toe. Do not wear sandals. If you use a stepladder: Make sure that it is fully opened. Do not climb a closed stepladder. Make sure that both sides of the stepladder are locked into place. Ask someone to hold it for you, if possible. Clearly mark and make sure that you can see: Any grab bars or handrails. First and last steps. Where the edge of each step is. Use tools that help you move around (mobility aids) if they are needed. These include: Canes. Walkers. Scooters. Crutches. Turn on the lights when you go into a dark area. Replace any light bulbs as soon as they burn out. Set up your furniture so you have a clear path. Avoid moving your furniture around. If any of your floors are uneven, fix them. If there are any pets around you, be aware of where they are. Review your medicines with your doctor. Some medicines can make you feel dizzy. This can increase your chance of falling. Ask your doctor what other things that you can do to help prevent falls. This information is not intended to replace advice given to you by your health care provider. Make sure you discuss any questions you have with your health care provider. Document Released: 06/28/2009 Document Revised: 02/07/2016 Document Reviewed: 10/06/2014 Elsevier Interactive Patient Education  2017 [employer].

## 2023-01-12 ENCOUNTER — Encounter: Payer: Self-pay | Admitting: Nurse Practitioner

## 2023-01-12 ENCOUNTER — Ambulatory Visit (INDEPENDENT_AMBULATORY_CARE_PROVIDER_SITE_OTHER): Payer: Medicare PPO | Admitting: Nurse Practitioner

## 2023-01-12 VITALS — BP 136/88 | HR 76 | Wt 196.4 lb

## 2023-01-12 DIAGNOSIS — M8580 Other specified disorders of bone density and structure, unspecified site: Secondary | ICD-10-CM

## 2023-01-12 DIAGNOSIS — B372 Candidiasis of skin and nail: Secondary | ICD-10-CM

## 2023-01-12 DIAGNOSIS — D508 Other iron deficiency anemias: Secondary | ICD-10-CM

## 2023-01-12 DIAGNOSIS — E876 Hypokalemia: Secondary | ICD-10-CM

## 2023-01-12 DIAGNOSIS — E782 Mixed hyperlipidemia: Secondary | ICD-10-CM | POA: Diagnosis not present

## 2023-01-12 DIAGNOSIS — E559 Vitamin D deficiency, unspecified: Secondary | ICD-10-CM

## 2023-01-12 DIAGNOSIS — E038 Other specified hypothyroidism: Secondary | ICD-10-CM | POA: Diagnosis not present

## 2023-01-12 DIAGNOSIS — E063 Autoimmune thyroiditis: Secondary | ICD-10-CM

## 2023-01-12 DIAGNOSIS — E88819 Insulin resistance, unspecified: Secondary | ICD-10-CM

## 2023-01-12 DIAGNOSIS — F418 Other specified anxiety disorders: Secondary | ICD-10-CM | POA: Diagnosis not present

## 2023-01-12 DIAGNOSIS — Z6832 Body mass index (BMI) 32.0-32.9, adult: Secondary | ICD-10-CM

## 2023-01-12 DIAGNOSIS — Z789 Other specified health status: Secondary | ICD-10-CM | POA: Diagnosis not present

## 2023-01-12 DIAGNOSIS — Z8639 Personal history of other endocrine, nutritional and metabolic disease: Secondary | ICD-10-CM

## 2023-01-12 LAB — LIPID PANEL

## 2023-01-12 LAB — VITAMIN D 25 HYDROXY (VIT D DEFICIENCY, FRACTURES)

## 2023-01-12 LAB — TSH

## 2023-01-12 MED ORDER — FLUCONAZOLE 150 MG PO TABS
ORAL_TABLET | ORAL | 1 refills | Status: DC
Start: 2023-01-12 — End: 2023-02-05

## 2023-01-12 MED ORDER — LEVOTHYROXINE SODIUM 50 MCG PO TABS
50.0000 ug | ORAL_TABLET | Freq: Every day | ORAL | 3 refills | Status: DC
Start: 2023-01-12 — End: 2023-05-21

## 2023-01-12 MED ORDER — NYSTATIN 100000 UNIT/GM EX CREA
1.0000 | TOPICAL_CREAM | Freq: Two times a day (BID) | CUTANEOUS | 3 refills | Status: DC
Start: 2023-01-12 — End: 2023-02-17

## 2023-01-12 MED ORDER — CICLOPIROX 8 % EX SOLN: Freq: Every day | CUTANEOUS | 3 refills | Status: DC

## 2023-01-12 MED ORDER — ALPRAZOLAM 0.5 MG PO TABS
0.5000 mg | ORAL_TABLET | Freq: Every evening | ORAL | 3 refills | Status: AC | PRN
Start: 2023-01-12 — End: ?

## 2023-01-12 NOTE — Patient Instructions (Addendum)
I will let you know what your labs show.   I have sent in the medication for your rash. I sent in a pill and a cream. Let me know if you don't have any improvement in the next 2 weeks.   I sent in a nail polish medication for your nail. This can take a while to completely heal, but should help.

## 2023-01-12 NOTE — Progress Notes (Unsigned)
  [name], DNP, AGNP-c [facility]  [address] [place], [place] [address] [phone]  ESTABLISHED PATIENT- Chronic Health and/or Follow-Up Visit  Blood pressure 136/88, pulse 76, weight 196 lb 6.4 oz (89.1 kg).    [name] Nolan is a 77 y.o. year old female presenting today for evaluation and management of chronic conditions.   The patient presents today with general updates on her life and family. She reports feeling "just alright" and shares her recent experience of finally making it to the beach after six and a half years. She describes the trip as "awesome" and mentions taking her daughter and her babysitter to help with her babies. The patient expresses joy over the trip, despite some duress due to her daughter's school commitments. She also mentions her [place] experiencing her first beach trip and not enjoying the water.  The patient discusses her granddaughter's achievements, including receiving an invitation from the [employer] and her work at a funeral home. She expresses pride in her granddaughter's dedication and her recent offer for an internship, which will help her complete required hours for her career.  She shares concerns about her daughter, who has been struggling with math and is under stress from her relationship with her son-in-law. The patient mentions her daughter's return to her husband and the ongoing challenges in their marriage, including her daughter developing hives due to stress.  The patient also mentions a persistent fungal infection under her breasts, for which she previously received ineffective treatment. She reports using Gold Bond itchy cream for relief.  Lastly, the patient requests refills for her medications, including Synthroid, and confirms she still has some Xanax left, which she takes only when needed.  All ROS negative with exception of what is listed above.   PHYSICAL EXAM Physical  Exam  PLAN Problem List Items Addressed This Visit   None   No follow-ups on file.   [name], DNP, AGNP-c 01/12/2023  3:06 PM

## 2023-01-13 LAB — COMPREHENSIVE METABOLIC PANEL
ALT: 13 IU/L (ref 0–32)
AST: 21 IU/L (ref 0–40)
Albumin/Globulin Ratio: 1.6 (ref 1.2–2.2)
Albumin: 4.4 g/dL (ref 3.8–4.8)
Alkaline Phosphatase: 99 IU/L (ref 44–121)
BUN/Creatinine Ratio: 11 — ABNORMAL LOW (ref 12–28)
BUN: 11 mg/dL (ref 8–27)
Bilirubin Total: 0.4 mg/dL (ref 0.0–1.2)
CO2: 22 mmol/L (ref 20–29)
Calcium: 10.3 mg/dL (ref 8.7–10.3)
Chloride: 102 mmol/L (ref 96–106)
Creatinine, Ser: 0.96 mg/dL (ref 0.57–1.00)
Globulin, Total: 2.7 g/dL (ref 1.5–4.5)
Glucose: 90 mg/dL (ref 70–99)
Potassium: 4.2 mmol/L (ref 3.5–5.2)
Sodium: 141 mmol/L (ref 134–144)
Total Protein: 7.1 g/dL (ref 6.0–8.5)
eGFR: 62 mL/min/{1.73_m2} (ref >59)

## 2023-01-13 LAB — CBC WITH DIFFERENTIAL/PLATELET
Basophils Absolute: 0.1 10*3/uL (ref 0.0–0.2)
Basos: 2 %
EOS (ABSOLUTE): 0.2 10*3/uL (ref 0.0–0.4)
Eos: 5 %
Hematocrit: 42.4 % (ref 34.0–46.6)
Hemoglobin: 13.8 g/dL (ref 11.1–15.9)
Immature Grans (Abs): 0 10*3/uL (ref 0.0–0.1)
Immature Granulocytes: 0 %
Lymphocytes Absolute: 1.8 10*3/uL (ref 0.7–3.1)
Lymphs: 33 %
MCH: 29.7 pg (ref 26.6–33.0)
MCHC: 32.5 g/dL (ref 31.5–35.7)
MCV: 91 fL (ref 79–97)
Monocytes Absolute: 0.4 10*3/uL (ref 0.1–0.9)
Monocytes: 7 %
Neutrophils Absolute: 2.9 10*3/uL (ref 1.4–7.0)
Neutrophils: 53 %
Platelets: 274 10*3/uL (ref 150–450)
RBC: 4.64 x10E6/uL (ref 3.77–5.28)
RDW: 13.3 % (ref 11.7–15.4)
WBC: 5.3 10*3/uL (ref 3.4–10.8)

## 2023-01-13 LAB — HEMOGLOBIN A1C
Est. average glucose Bld gHb Est-mCnc: 108 mg/dL
Hgb A1c MFr Bld: 5.4 % (ref 4.8–5.6)

## 2023-01-13 LAB — T4, FREE: Free T4: 1.08 ng/dL (ref 0.82–1.77)

## 2023-01-13 LAB — LIPID PANEL
Cholesterol, Total: 243 mg/dL — ABNORMAL HIGH (ref 100–199)
HDL: 55 mg/dL (ref >39)
LDL Chol Calc (NIH): 163 mg/dL — ABNORMAL HIGH (ref 0–99)
Triglycerides: 138 mg/dL (ref 0–149)
VLDL Cholesterol Cal: 25 mg/dL (ref 5–40)

## 2023-01-22 ENCOUNTER — Encounter: Payer: Self-pay | Admitting: Nurse Practitioner

## 2023-01-22 DIAGNOSIS — Z789 Other specified health status: Secondary | ICD-10-CM | POA: Insufficient documentation

## 2023-01-22 DIAGNOSIS — G720 Drug-induced myopathy: Secondary | ICD-10-CM | POA: Insufficient documentation

## 2023-01-22 NOTE — Assessment & Plan Note (Signed)
Chronic. Fairly well controlled at this time. Refills provided on effexor. Taking xanax on very limited basis; no refills needed today.

## 2023-01-22 NOTE — Assessment & Plan Note (Signed)
Chronic. Currently managed with diet. Intolerant of statin therapy. Monitor closely.

## 2023-01-22 NOTE — Assessment & Plan Note (Signed)
Intertrigo noted under breasts bilaterally. Rash consistent with yeast. Will send treatment.

## 2023-01-22 NOTE — Assessment & Plan Note (Signed)
Labs pending. Not currently on iron.

## 2023-01-22 NOTE — Assessment & Plan Note (Signed)
Chronic insulin resistance with elevation in A1c historically. Not currently on medication, but given the presence of yeast-like infection of the skin there is concern that blood sugar levels may have elevated.  Plan: - labs today - monitor carbohydrate intake and keep starches and sweets to a minimum

## 2023-01-22 NOTE — Assessment & Plan Note (Signed)
Labs pending. No alarm symptoms.

## 2023-01-22 NOTE — Assessment & Plan Note (Signed)
Chronic osteopenia without any recent fracture.  Recommend daily physical activity and supplementation of calcium and vitamin D to help reduce bone loss.  Will monitor lab levels today.

## 2023-01-22 NOTE — Assessment & Plan Note (Signed)
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 Currently managed with levothyroxine .  Plan: - labs today - continue with current treatment unless otherwise advised based on lab results.

## 2023-01-22 NOTE — Assessment & Plan Note (Signed)
BMI 32.68 today. Recommend daily physical activity to help with overall health.

## 2023-01-22 NOTE — Assessment & Plan Note (Signed)
Labs pending for monitoring.  

## 2023-01-22 NOTE — Assessment & Plan Note (Signed)
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 Currently managed with levothyroxine .  Plan: - labs today - continue with current treatment unless otherwise advised based on lab results.

## 2023-02-01 ENCOUNTER — Encounter: Payer: Self-pay | Admitting: Nurse Practitioner

## 2023-02-03 ENCOUNTER — Other Ambulatory Visit: Payer: Self-pay | Admitting: Nurse Practitioner

## 2023-02-03 ENCOUNTER — Ambulatory Visit: Payer: Medicare PPO | Admitting: Neurology

## 2023-02-03 DIAGNOSIS — E559 Vitamin D deficiency, unspecified: Secondary | ICD-10-CM

## 2023-02-03 DIAGNOSIS — D508 Other iron deficiency anemias: Secondary | ICD-10-CM

## 2023-02-03 DIAGNOSIS — E038 Other specified hypothyroidism: Secondary | ICD-10-CM

## 2023-02-03 DIAGNOSIS — E782 Mixed hyperlipidemia: Secondary | ICD-10-CM

## 2023-02-03 DIAGNOSIS — Z6832 Body mass index (BMI) 32.0-32.9, adult: Secondary | ICD-10-CM

## 2023-02-03 DIAGNOSIS — G43719 Chronic migraine without aura, intractable, without status migrainosus: Secondary | ICD-10-CM | POA: Diagnosis not present

## 2023-02-03 DIAGNOSIS — E876 Hypokalemia: Secondary | ICD-10-CM

## 2023-02-03 DIAGNOSIS — M8580 Other specified disorders of bone density and structure, unspecified site: Secondary | ICD-10-CM

## 2023-02-03 DIAGNOSIS — E88819 Insulin resistance, unspecified: Secondary | ICD-10-CM

## 2023-02-03 MED ORDER — METHYLPREDNISOLONE 4 MG PO TBPK
ORAL_TABLET | ORAL | 1 refills | Status: DC
Start: 2023-02-03 — End: 2023-05-05

## 2023-02-03 MED ORDER — ONABOTULINUMTOXINA 200 UNITS IJ SOLR
155.0000 [IU] | Freq: Once | INTRAMUSCULAR | Status: AC
Start: 2023-02-03 — End: 2023-02-03
  Administered 2023-02-03: 155 [IU] via INTRAMUSCULAR

## 2023-02-03 NOTE — Progress Notes (Signed)
Consent Form Botulism Toxin Injection For Chronic Migraine  02/03/2023: stable; stable: Doing extremely well at least 70% improvement continues in migraine frequency. She is not a Garment/textile technologist. Gave a medrol dosepak in case she has a right-sided occipital nerve attack and we are not here to provide nerve block, keep it a drawer and start right away 11/11/2022: stable  08/19/2022: stable: Doing extremely well at least 70% improvement continues in migraine frequency. She is not a Garment/textile technologist. Gave a medrol dosepak in case she has a right-sided occipital nerve attack and we are not here to provide nerve block, keep it a drawer and start right away  05/27/2022: stable, doing well 03/02/2022: Stable but under lots of stress 12/07/2021: Stable 09/03/2021 stable: Doing extremely well at least 70% improvement continues in migraine frequency. She is not a Garment/textile technologist.   Reviewed orally with patient, additionally signature is on file:  Botulism toxin has been approved by the Federal drug administration for treatment of chronic migraine. Botulism toxin does not cure chronic migraine and it may not be effective in some patients.  The administration of botulism toxin is accomplished by injecting a small amount of toxin into the muscles of the neck and head. Dosage must be titrated for each individual. Any benefits resulting from botulism toxin tend to wear off after 3 months with a repeat injection required if benefit is to be maintained. Injections are usually done every 3-4 months with maximum effect peak achieved by about 2 or 3 weeks. Botulism toxin is expensive and you should be sure of what costs you will incur resulting from the injection.  The side effects of botulism toxin use for chronic migraine may include:   -Transient, and usually mild, facial weakness with facial injections  -Transient, and usually mild, head or neck weakness with head/neck injections  -Reduction or loss of forehead facial animation due to  forehead muscle weakness  -Eyelid drooping  -Dry eye  -Pain at the site of injection or bruising at the site of injection  -Double vision  -Potential unknown long term risks  Contraindications: You should not have Botox if you are pregnant, nursing, allergic to albumin, have an infection, skin condition, or muscle weakness at the site of the injection, or have myasthenia gravis, Lambert-Eaton syndrome, or ALS.  It is also possible that as with any injection, there may be an allergic reaction or no effect from the medication. Reduced effectiveness after repeated injections is sometimes seen and rarely infection at the injection site may occur. All care will be taken to prevent these side effects. If therapy is given over a long time, atrophy and wasting in the muscle injected may occur. Occasionally the patient's become refractory to treatment because they develop antibodies to the toxin. In this event, therapy needs to be modified.  I have read the above information and consent to the administration of botulism toxin.    BOTOX PROCEDURE NOTE FOR MIGRAINE HEADACHE    Contraindications and precautions discussed with patient(above). Aseptic procedure was observed and patient tolerated procedure. Procedure performed by Dr. Artemio Aly  The condition has existed for more than 6 months, and pt does not have a diagnosis of ALS, Myasthenia Gravis or Lambert-Eaton Syndrome.  Risks and benefits of injections discussed and pt agrees to proceed with the procedure.  Written consent obtained  These injections are medically necessary. Pt  receives good benefits from these injections. These injections do not cause sedations or hallucinations which the oral therapies may cause.  Description of  procedure:  The patient was placed in a sitting position. The standard protocol was used for Botox as follows, with 5 units of Botox injected at each site:   -Procerus muscle, midline injection  -Corrugator  muscle, bilateral injection  -Frontalis muscle, bilateral injection, with 2 sites each side, medial injection was performed in the upper one third of the frontalis muscle, in the region vertical from the medial inferior edge of the superior orbital rim. The lateral injection was again in the upper one third of the forehead vertically above the lateral limbus of the cornea, 1.5 cm lateral to the medial injection site.  -Temporalis muscle injection, 4 sites, bilaterally. The first injection was 3 cm above the tragus of the ear, second injection site was 1.5 cm to 3 cm up from the first injection site in line with the tragus of the ear. The third injection site was 1.5-3 cm forward between the first 2 injection sites. The fourth injection site was 1.5 cm posterior to the second injection site.   -Occipitalis muscle injection, 3 sites, bilaterally. The first injection was done one half way between the occipital protuberance and the tip of the mastoid process behind the ear. The second injection site was done lateral and superior to the first, 1 fingerbreadth from the first injection. The third injection site was 1 fingerbreadth superiorly and medially from the first injection site.  -Cervical paraspinal muscle injection, 2 sites, bilateral knee first injection site was 1 cm from the midline of the cervical spine, 3 cm inferior to the lower border of the occipital protuberance. The second injection site was 1.5 cm superiorly and laterally to the first injection site.  -Trapezius muscle injection was performed at 3 sites, bilaterally. The first injection site was in the upper trapezius muscle halfway between the inflection point of the neck, and the acromion. The second injection site was one half way between the acromion and the first injection site. The third injection was done between the first injection site and the inflection point of the neck.   Will return for repeat injection in 3 months.   155  units of Botox was used, 45u Botox not injected was wasted. The patient tolerated the procedure well, there were no complications of the above procedure.

## 2023-02-03 NOTE — Telephone Encounter (Signed)
Refill request last apt 01/12/23

## 2023-02-03 NOTE — Progress Notes (Signed)
Botox- 200 units x 1 vial [id number] Expiration: 02/2025 NDC: 9528-4132-44  Bacteriostatic 0.9% Sodium Chloride- 4 mL  Lot: [id number] Expiration: 12/15/2023 NDC: 5366-4403-47  Dx: Q25.956 B/B Witnessed by Delmer Islam

## 2023-02-17 ENCOUNTER — Encounter: Payer: Self-pay | Admitting: Nurse Practitioner

## 2023-02-17 ENCOUNTER — Ambulatory Visit (INDEPENDENT_AMBULATORY_CARE_PROVIDER_SITE_OTHER): Payer: Medicare PPO | Admitting: Nurse Practitioner

## 2023-02-17 VITALS — BP 132/92 | HR 78 | Wt 196.0 lb

## 2023-02-17 DIAGNOSIS — B372 Candidiasis of skin and nail: Secondary | ICD-10-CM

## 2023-02-17 DIAGNOSIS — E876 Hypokalemia: Secondary | ICD-10-CM

## 2023-02-17 DIAGNOSIS — E782 Mixed hyperlipidemia: Secondary | ICD-10-CM | POA: Diagnosis not present

## 2023-02-17 DIAGNOSIS — I10 Essential (primary) hypertension: Secondary | ICD-10-CM | POA: Diagnosis not present

## 2023-02-17 DIAGNOSIS — I700 Atherosclerosis of aorta: Secondary | ICD-10-CM

## 2023-02-17 DIAGNOSIS — R635 Abnormal weight gain: Secondary | ICD-10-CM | POA: Diagnosis not present

## 2023-02-17 DIAGNOSIS — E559 Vitamin D deficiency, unspecified: Secondary | ICD-10-CM | POA: Diagnosis not present

## 2023-02-17 DIAGNOSIS — D508 Other iron deficiency anemias: Secondary | ICD-10-CM

## 2023-02-17 DIAGNOSIS — Z789 Other specified health status: Secondary | ICD-10-CM

## 2023-02-17 DIAGNOSIS — Z6832 Body mass index (BMI) 32.0-32.9, adult: Secondary | ICD-10-CM

## 2023-02-17 DIAGNOSIS — E88819 Insulin resistance, unspecified: Secondary | ICD-10-CM

## 2023-02-17 DIAGNOSIS — M8580 Other specified disorders of bone density and structure, unspecified site: Secondary | ICD-10-CM | POA: Diagnosis not present

## 2023-02-17 DIAGNOSIS — E038 Other specified hypothyroidism: Secondary | ICD-10-CM | POA: Diagnosis not present

## 2023-02-17 DIAGNOSIS — E063 Autoimmune thyroiditis: Secondary | ICD-10-CM

## 2023-02-17 MED ORDER — WEGOVY 0.25 MG/0.5ML ~~LOC~~ SOAJ
0.2500 mg | SUBCUTANEOUS | 0 refills | Status: DC
Start: 2023-02-17 — End: 2023-05-05

## 2023-02-17 MED ORDER — NYSTATIN 100000 UNIT/GM EX CREA
1.0000 | TOPICAL_CREAM | Freq: Two times a day (BID) | CUTANEOUS | 3 refills | Status: DC
Start: 2023-02-17 — End: 2023-06-09

## 2023-02-17 MED ORDER — VALSARTAN 40 MG PO TABS
40.0000 mg | ORAL_TABLET | Freq: Every day | ORAL | 3 refills | Status: DC
Start: 2023-02-17 — End: 2023-05-19

## 2023-02-17 NOTE — Progress Notes (Signed)
[name], DNP, AGNP-c [facility]  [address] [place], [place] [address] [phone]  ESTABLISHED PATIENT- Chronic Health and/or Follow-Up Visit  Blood pressure (!) 132/92, pulse 78, weight 196 lb (88.9 kg).    [name] Nolan is a 77 y.o. year old female presenting today for evaluation and management of chronic conditions.   Blood Pressure - elevated the last few visits - high cholesterol - Family history of heart disease in both parents and both sets of grandparents.   Yeast Dermatitis - Looking much better under her breasts and abdomen  Weight - Concerns with lack of weight loss - Has been on semaglutide in the past and had significant success. Insurance did not cover last time and weight gain has resulted.  - interested in [place] for weight management and decrease of CV risks due to HTN, HLD, Hypothyroidism, prolonged QT.  - Has not had MI or CVA - Chronic small vessel ischemia is noted in white matter on CT angio and aortic atherosclerosis noted on CT abd/pelvis historically.  - Has hx of insulin resistance, but current A1c is normal. Of note, she has recently been on semaglutide, which we would expect the A1c to be normal due to this.  - Has been working on diet and exercise consistently for at least 3 months with no weight changes.  - Historically has tried topiramate and wellbutrin off label with no weight benefit. She has also tried phentermine, but given her age, HTN, and CV risks this had to be discontinued.   - Unable to tolerate metformin due to GI distress  All ROS negative with exception of what is listed above.   PHYSICAL EXAM Physical Exam Vitals and nursing note reviewed.  Constitutional:      Appearance: Normal appearance.  HENT:     Head: Normocephalic.  Eyes:     Pupils: Pupils are equal, round, and reactive to light.  Neck:     Vascular: No carotid bruit.  Cardiovascular:     Rate and Rhythm: Normal rate and  regular rhythm.     Pulses: Normal pulses.     Heart sounds: Normal heart sounds.  Pulmonary:     Effort: Pulmonary effort is normal.     Breath sounds: Normal breath sounds.  Musculoskeletal:        General: Normal range of motion.     Cervical back: Normal range of motion.  Skin:    General: Skin is warm.  Neurological:     General: No focal deficit present.     Mental Status: She is alert and oriented to person, place, and time.  Psychiatric:        Mood and Affect: Mood normal.     PLAN Problem List Items Addressed This Visit     Aortic atherosclerosis (HCC)   Relevant Medications   valsartan (DIOVAN) 40 MG tablet   Mixed hyperlipidemia   Relevant Medications   Semaglutide-Weight Management (WEGOVY) 0.25 MG/0.5ML SOAJ   valsartan (DIOVAN) 40 MG tablet   nystatin cream (MYCOSTATIN)   Hypothyroidism   Relevant Medications   nystatin cream (MYCOSTATIN)   Osteopenia   Relevant Medications   nystatin cream (MYCOSTATIN)   Anemia   Relevant Medications   nystatin cream (MYCOSTATIN)   Insulin resistance   Relevant Medications   Semaglutide-Weight Management (WEGOVY) 0.25 MG/0.5ML SOAJ   nystatin cream (MYCOSTATIN)   Yeast dermatitis   Relevant Medications   nystatin cream (MYCOSTATIN)   Adult BMI 32.0-32.9 kg/sq m - Primary   Relevant  Medications   Semaglutide-Weight Management (WEGOVY) 0.25 MG/0.5ML SOAJ   nystatin cream (MYCOSTATIN)   Hypokalemia   Relevant Medications   nystatin cream (MYCOSTATIN)   Statin intolerance   Other Visit Diagnoses     Primary hypertension       Relevant Medications   Semaglutide-Weight Management (WEGOVY) 0.25 MG/0.5ML SOAJ   valsartan (DIOVAN) 40 MG tablet   Weight gain       Relevant Medications   Semaglutide-Weight Management (WEGOVY) 0.25 MG/0.5ML SOAJ   Vitamin D deficiency       Relevant Medications   nystatin cream (MYCOSTATIN)      Lengthy discussion today about weight management and use of diet and exercise to  help manage. Unfortunately, we are unable to continue with phentermine due to HTN and increased CV risk factors. She had significant success with semaglutide until insurance stopped covering. It is not exactly clear what her A1c would be without the use of weight management medications given she has been on these for a few months, but I suspect her baseline is much higher than it is at this time. She does have a qualifying BMI for semaglutide and given her CV risks I feel that this would be an excellent choice of medication for her to help reduce her risks and assist with weight management.  Information has been provided today on diet and exercise. We will send in script for semaglutide and attempt to get coverage based on her CV risks seeing that this has been so helpful.   Yeast dermatitis is improved. No changes.   BP is elevated. We will go ahead and restart valsartan given this noted change. Unfortunately, it appears that her BP was well controlled while on semaglutide, but is not now that she is not on the medication. I am hopeful that restart will help with control.  Return in about 4 months (around 06/19/2023) for Med Management 30.   [name], DNP, AGNP-c 02/17/2023  3:32 PM

## 2023-02-17 NOTE — Patient Instructions (Addendum)
I have sent in a medication called valsartan for your blood pressure. I will send the [facility] prescription in and we will try to get coverage for the cardiovascular risks. We will see what insurance says.   WEIGHT LOSS PLANNING  For best management of weight, it is vital to balance intake versus output. This means the number of calories burned per day must be less than the calories you take in with food and drink.   I recommend trying to follow a diet with the following: Calories: 1200-1500 calories per day Carbohydrates: 150-180 grams of carbohydrates per day  Why: Gives your body enough "quick fuel" for cells to maintain normal function without sending them into starvation mode.  Protein: At least 90 grams of protein per day- 30 grams with each meal Why: Protein takes longer and uses more energy than carbohydrates to break down for fuel. The carbohydrates in your meals serves as quick energy sources and proteins help use some of that extra quick energy to break down to produce long term energy. This helps you not feel hungry as quickly and protein breakdown burns calories.  Water: Drink AT LEAST 64 ounces of water per day  Why: Water is essential to healthy metabolism. Water helps to fill the stomach and keep you fuller longer. Water is required for healthy digestion and filtering of waste in the body.  Fat: Limit fats in your diet- when choosing fats, choose foods with lower fats content such as lean meats (chicken, fish, [place]).  Why: Increased fat intake leads to storage "for later". Once you burn your carbohydrate energy, your body goes into fat and protein breakdown mode to help you loose weight.  Cholesterol: Fats and oils that are LIQUID at room temperature are best. Choose vegetable oils (olive oil, avocado oil, nuts). Avoid fats that are SOLID at room temperature (animal fats, processed meats). Healthy fats are often found in whole grains, beans, nuts, seeds, and berries.  Why: Elevated  cholesterol levels lead to build up of cholesterol on the inside of your blood vessels. This will eventually cause the blood vessels to become hard and can lead to high blood pressure and damage to your organs. When the blood flow is reduced, but the pressure is high from cholesterol buildup, parts of the cholesterol can break off and form clots that can go to the brain or heart leading to a stroke or heart attack.  Fiber: Increase amount of SOLUBLE the fiber in your diet. This helps to fill you up, lowers cholesterol, and helps with digestion. Some foods high in soluble fiber are oats, peas, beans, apples, carrots, barley, and citrus fruits.   Why: Fiber fills you up, helps remove excess cholesterol, and aids in healthy digestion which are all very important in weight management.   I recommend the following as a minimum activity routine: Purposeful walk or other physical activity at least 20 minutes every single day. This means purposefully taking a walk, jog, bike, swim, treadmill, elliptical, dance, etc.  This activity should be ABOVE your normal daily activities, such as walking at work. Goal exercise should be at least 150 minutes a week- work your way up to this.   Heart Rate: Your maximum exercise heart rate should be 220 - Your Age in Years. When exercising, get your heart rate up, but avoid going over the maximum targeted heart rate.  60-70% of your maximum heart rate is where you tend to burn the most fat. To find this number:  220 -  Age In Years= Max HR  Max HR x 0.6 (or 0.7) = Fat Burning HR The Fat Burning HR is your goal heart rate while working out to burn the most fat.  NEVER exercise to the point your feel lightheaded, weak, nauseated, dizzy. If you experience ANY of these symptoms- STOP exercise! Allow yourself to cool down and your heart rate to come down. Then restart slower next time.  If at ANY TIME you feel chest pain or chest pressure during exercise, STOP IMMEDIATELY and  seek medical attention.

## 2023-02-27 ENCOUNTER — Encounter: Payer: Self-pay | Admitting: Nurse Practitioner

## 2023-03-03 ENCOUNTER — Encounter: Payer: Self-pay | Admitting: Nurse Practitioner

## 2023-03-07 ENCOUNTER — Telehealth: Payer: Self-pay | Admitting: Nurse Practitioner

## 2023-03-07 DIAGNOSIS — I700 Atherosclerosis of aorta: Secondary | ICD-10-CM

## 2023-03-07 NOTE — Telephone Encounter (Signed)
P.A. WEGOVY 

## 2023-03-08 NOTE — Telephone Encounter (Signed)
Wegovy denied, Medicare Part D only covered if pt has existing cardiovascular disease like previous heart attack, stroke or PAD.

## 2023-03-09 ENCOUNTER — Encounter: Payer: Self-pay | Admitting: Nurse Practitioner

## 2023-03-10 ENCOUNTER — Encounter: Payer: Self-pay | Admitting: Nurse Practitioner

## 2023-03-10 DIAGNOSIS — I700 Atherosclerosis of aorta: Secondary | ICD-10-CM | POA: Insufficient documentation

## 2023-03-10 NOTE — Telephone Encounter (Signed)
[name] Nolan,   I looked back through her imaging and found that she did have a finding of "Aortoiliac atherosclerotic vascular disease" in 2022. Given the iliac vessels are affected, this should be considered PAD. Can we try to resend this?

## 2023-03-13 ENCOUNTER — Telehealth: Payer: Self-pay | Admitting: Internal Medicine

## 2023-03-13 NOTE — Telephone Encounter (Signed)
Error

## 2023-03-13 NOTE — Telephone Encounter (Signed)
There is an EOC that was clinically denied within the last 60 days. This request needs to be sent to the Grievance and Appeals Dept. at [employer]. Appeal requests must come from the member or the provider. 

## 2023-03-15 ENCOUNTER — Other Ambulatory Visit: Payer: Self-pay | Admitting: Nurse Practitioner

## 2023-03-15 DIAGNOSIS — E559 Vitamin D deficiency, unspecified: Secondary | ICD-10-CM

## 2023-03-15 DIAGNOSIS — E876 Hypokalemia: Secondary | ICD-10-CM

## 2023-03-15 DIAGNOSIS — D508 Other iron deficiency anemias: Secondary | ICD-10-CM

## 2023-03-15 DIAGNOSIS — M8580 Other specified disorders of bone density and structure, unspecified site: Secondary | ICD-10-CM

## 2023-03-15 DIAGNOSIS — E782 Mixed hyperlipidemia: Secondary | ICD-10-CM

## 2023-03-15 DIAGNOSIS — Z6832 Body mass index (BMI) 32.0-32.9, adult: Secondary | ICD-10-CM

## 2023-03-15 DIAGNOSIS — E038 Other specified hypothyroidism: Secondary | ICD-10-CM

## 2023-03-15 DIAGNOSIS — E88819 Insulin resistance, unspecified: Secondary | ICD-10-CM

## 2023-03-18 NOTE — Telephone Encounter (Signed)
See telephone call

## 2023-05-05 ENCOUNTER — Ambulatory Visit: Payer: Medicare PPO | Admitting: Neurology

## 2023-05-05 DIAGNOSIS — G43719 Chronic migraine without aura, intractable, without status migrainosus: Secondary | ICD-10-CM

## 2023-05-05 MED ORDER — ONABOTULINUMTOXINA 200 UNITS IJ SOLR
155.0000 [IU] | Freq: Once | INTRAMUSCULAR | Status: AC
Start: 2023-05-05 — End: 2023-05-05
  Administered 2023-05-05: 155 [IU] via INTRAMUSCULAR

## 2023-05-05 NOTE — Progress Notes (Signed)
Consent Form Botulism Toxin Injection For Chronic Migraine  05/05/2023: stable, doing well 02/03/2023: stable; stable: Doing extremely well at least 70% improvement continues in migraine frequency. She is not a Garment/textile technologist. Gave a medrol dosepak in case she has a right-sided occipital nerve attack and we are not here to provide nerve block, keep it a drawer and start right away 11/11/2022: stable  08/19/2022: stable: Doing extremely well at least 70% improvement continues in migraine frequency. She is not a Garment/textile technologist. Gave a medrol dosepak in case she has a right-sided occipital nerve attack and we are not here to provide nerve block, keep it a drawer and start right away  05/27/2022: stable, doing well 03/02/2022: Stable but under lots of stress 12/07/2021: Stable 09/03/2021 stable: Doing extremely well at least 70% improvement continues in migraine frequency. She is not a Garment/textile technologist.   Reviewed orally with patient, additionally signature is on file:  Botulism toxin has been approved by the Federal drug administration for treatment of chronic migraine. Botulism toxin does not cure chronic migraine and it may not be effective in some patients.  The administration of botulism toxin is accomplished by injecting a small amount of toxin into the muscles of the neck and head. Dosage must be titrated for each individual. Any benefits resulting from botulism toxin tend to wear off after 3 months with a repeat injection required if benefit is to be maintained. Injections are usually done every 3-4 months with maximum effect peak achieved by about 2 or 3 weeks. Botulism toxin is expensive and you should be sure of what costs you will incur resulting from the injection.  The side effects of botulism toxin use for chronic migraine may include:   -Transient, and usually mild, facial weakness with facial injections  -Transient, and usually mild, head or neck weakness with head/neck injections  -Reduction or loss of  forehead facial animation due to forehead muscle weakness  -Eyelid drooping  -Dry eye  -Pain at the site of injection or bruising at the site of injection  -Double vision  -Potential unknown long term risks  Contraindications: You should not have Botox if you are pregnant, nursing, allergic to albumin, have an infection, skin condition, or muscle weakness at the site of the injection, or have myasthenia gravis, Lambert-Eaton syndrome, or ALS.  It is also possible that as with any injection, there may be an allergic reaction or no effect from the medication. Reduced effectiveness after repeated injections is sometimes seen and rarely infection at the injection site may occur. All care will be taken to prevent these side effects. If therapy is given over a long time, atrophy and wasting in the muscle injected may occur. Occasionally the patient's become refractory to treatment because they develop antibodies to the toxin. In this event, therapy needs to be modified.  I have read the above information and consent to the administration of botulism toxin.    BOTOX PROCEDURE NOTE FOR MIGRAINE HEADACHE    Contraindications and precautions discussed with patient(above). Aseptic procedure was observed and patient tolerated procedure. Procedure performed by Dr. Artemio Aly  The condition has existed for more than 6 months, and pt does not have a diagnosis of ALS, Myasthenia Gravis or Lambert-Eaton Syndrome.  Risks and benefits of injections discussed and pt agrees to proceed with the procedure.  Written consent obtained  These injections are medically necessary. Pt  receives good benefits from these injections. These injections do not cause sedations or hallucinations which the oral therapies may  cause.  Description of procedure:  The patient was placed in a sitting position. The standard protocol was used for Botox as follows, with 5 units of Botox injected at each site:   -Procerus muscle,  midline injection  -Corrugator muscle, bilateral injection  -Frontalis muscle, bilateral injection, with 2 sites each side, medial injection was performed in the upper one third of the frontalis muscle, in the region vertical from the medial inferior edge of the superior orbital rim. The lateral injection was again in the upper one third of the forehead vertically above the lateral limbus of the cornea, 1.5 cm lateral to the medial injection site.  -Temporalis muscle injection, 4 sites, bilaterally. The first injection was 3 cm above the tragus of the ear, second injection site was 1.5 cm to 3 cm up from the first injection site in line with the tragus of the ear. The third injection site was 1.5-3 cm forward between the first 2 injection sites. The fourth injection site was 1.5 cm posterior to the second injection site.   -Occipitalis muscle injection, 3 sites, bilaterally. The first injection was done one half way between the occipital protuberance and the tip of the mastoid process behind the ear. The second injection site was done lateral and superior to the first, 1 fingerbreadth from the first injection. The third injection site was 1 fingerbreadth superiorly and medially from the first injection site.  -Cervical paraspinal muscle injection, 2 sites, bilateral knee first injection site was 1 cm from the midline of the cervical spine, 3 cm inferior to the lower border of the occipital protuberance. The second injection site was 1.5 cm superiorly and laterally to the first injection site.  -Trapezius muscle injection was performed at 3 sites, bilaterally. The first injection site was in the upper trapezius muscle halfway between the inflection point of the neck, and the acromion. The second injection site was one half way between the acromion and the first injection site. The third injection was done between the first injection site and the inflection point of the neck.   Will return for repeat  injection in 3 months.   155 units of Botox was used, 45u Botox not injected was wasted. The patient tolerated the procedure well, there were no complications of the above procedure.

## 2023-05-05 NOTE — Progress Notes (Signed)
Botox- 200 units x 1 vial Lot: [id number] Expiration: 07/2025 NDC: 8657-8469-62  Bacteriostatic 0.9% Sodium Chloride- 4 mL  Lot: [id number] Expiration: 12/15/2023 NDC: 3244-0102-72  Dx: Z36.644  B/B Witnessed by Elana Alm

## 2023-05-17 ENCOUNTER — Other Ambulatory Visit: Payer: Self-pay | Admitting: Nurse Practitioner

## 2023-05-17 DIAGNOSIS — I10 Essential (primary) hypertension: Secondary | ICD-10-CM

## 2023-05-21 ENCOUNTER — Encounter: Payer: Self-pay | Admitting: Nurse Practitioner

## 2023-05-21 ENCOUNTER — Ambulatory Visit (INDEPENDENT_AMBULATORY_CARE_PROVIDER_SITE_OTHER): Payer: Medicare PPO | Admitting: Nurse Practitioner

## 2023-05-21 VITALS — BP 132/82 | HR 81 | Wt 206.8 lb

## 2023-05-21 DIAGNOSIS — D508 Other iron deficiency anemias: Secondary | ICD-10-CM

## 2023-05-21 DIAGNOSIS — E88819 Insulin resistance, unspecified: Secondary | ICD-10-CM

## 2023-05-21 DIAGNOSIS — Z789 Other specified health status: Secondary | ICD-10-CM | POA: Diagnosis not present

## 2023-05-21 DIAGNOSIS — Z23 Encounter for immunization: Secondary | ICD-10-CM

## 2023-05-21 DIAGNOSIS — I10 Essential (primary) hypertension: Secondary | ICD-10-CM | POA: Diagnosis not present

## 2023-05-21 DIAGNOSIS — M8580 Other specified disorders of bone density and structure, unspecified site: Secondary | ICD-10-CM

## 2023-05-21 DIAGNOSIS — G720 Drug-induced myopathy: Secondary | ICD-10-CM

## 2023-05-21 DIAGNOSIS — F418 Other specified anxiety disorders: Secondary | ICD-10-CM | POA: Diagnosis not present

## 2023-05-21 DIAGNOSIS — E038 Other specified hypothyroidism: Secondary | ICD-10-CM | POA: Diagnosis not present

## 2023-05-21 DIAGNOSIS — I700 Atherosclerosis of aorta: Secondary | ICD-10-CM | POA: Diagnosis not present

## 2023-05-21 DIAGNOSIS — M5136 Other intervertebral disc degeneration, lumbar region: Secondary | ICD-10-CM

## 2023-05-21 DIAGNOSIS — E559 Vitamin D deficiency, unspecified: Secondary | ICD-10-CM

## 2023-05-21 DIAGNOSIS — Z6832 Body mass index (BMI) 32.0-32.9, adult: Secondary | ICD-10-CM

## 2023-05-21 DIAGNOSIS — E782 Mixed hyperlipidemia: Secondary | ICD-10-CM

## 2023-05-21 DIAGNOSIS — E063 Autoimmune thyroiditis: Secondary | ICD-10-CM | POA: Diagnosis not present

## 2023-05-21 DIAGNOSIS — E6609 Other obesity due to excess calories: Secondary | ICD-10-CM | POA: Diagnosis not present

## 2023-05-21 DIAGNOSIS — B372 Candidiasis of skin and nail: Secondary | ICD-10-CM

## 2023-05-21 DIAGNOSIS — E876 Hypokalemia: Secondary | ICD-10-CM

## 2023-05-21 MED ORDER — VENLAFAXINE HCL ER 75 MG PO CP24
150.0000 mg | ORAL_CAPSULE | Freq: Every day | ORAL | 3 refills | Status: DC
Start: 2023-05-21 — End: 2024-04-19

## 2023-05-21 MED ORDER — VALSARTAN 40 MG PO TABS
40.0000 mg | ORAL_TABLET | Freq: Every day | ORAL | 0 refills | Status: DC
Start: 2023-05-21 — End: 2023-07-14

## 2023-05-21 MED ORDER — CYCLOBENZAPRINE HCL 5 MG PO TABS
5.0000 mg | ORAL_TABLET | Freq: Three times a day (TID) | ORAL | 3 refills | Status: DC | PRN
Start: 2023-05-21 — End: 2024-04-19

## 2023-05-21 MED ORDER — GABAPENTIN 300 MG PO CAPS
1200.0000 mg | ORAL_CAPSULE | Freq: Every day | ORAL | 4 refills | Status: DC
Start: 2023-05-21 — End: 2024-04-19

## 2023-05-21 MED ORDER — FLUCONAZOLE 150 MG PO TABS
ORAL_TABLET | ORAL | 1 refills | Status: DC
Start: 2023-05-21 — End: 2023-07-14

## 2023-05-21 MED ORDER — LEVOTHYROXINE SODIUM 50 MCG PO TABS
50.0000 ug | ORAL_TABLET | Freq: Every day | ORAL | 3 refills | Status: DC
Start: 2023-05-21 — End: 2023-07-14

## 2023-05-21 NOTE — Patient Instructions (Signed)
I want you to increase your venlafaxine to 150mg  a day.

## 2023-05-21 NOTE — Progress Notes (Signed)
[name] Clamp, DNP, AGNP-c [facility]  [address] [place], [place] [address] [phone]  ESTABLISHED PATIENT- Chronic Health and/or Follow-Up Visit  Blood pressure 132/82, pulse 81, weight 206 lb 12.8 oz (93.8 kg), SpO2 97%.    [name] Nolan is a 77 y.o. year old female presenting today for evaluation and management of chronic conditions.   She tells me she is concerned today about her weight. She reports she thought she was doig well but when she weighed today she now feels very disappointed. She would like to try something to help manage her weight better. In the past she reports success with phentermine. She tells me she does not exercise regularly due to pain. She has also recently pulled a groin muscle and her back hurt likely due to this. She has chronic back pain, in addition to the acute pain.   She is feeling down due to the relationship strain with her daughter. She tells me that she doesn't really have a life and hse doesn't go anywhere. She is a member of church, but she is not going right now because COVID numbers have increased. 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 I have encouraged her to restart doing this to share her music with those who need and would appreciate it.   She has reports increased stress with her dogs health. She has pancreatic disease and the cost of care is challenging.   She tells me one good thing is she had new flooring put down in her home.    All ROS negative with exception of what is listed above.   PHYSICAL EXAM Physical Exam Vitals and nursing note reviewed.  Constitutional:      Appearance: Normal appearance. She is obese.  HENT:     Head: Normocephalic.   Eyes:     Conjunctiva/sclera: Conjunctivae normal.  Neck:     Vascular: No carotid bruit.  Cardiovascular:     Rate and Rhythm: Normal rate and regular rhythm.     Pulses: Normal pulses.     Heart sounds: Normal heart sounds.  Pulmonary:     Effort: Pulmonary effort is normal.     Breath sounds: Normal breath sounds.  Abdominal:     Palpations: Abdomen is soft.  Musculoskeletal:     Right lower leg: No edema.     Left lower leg: No edema.  Skin:    General: Skin is warm and dry.     Capillary Refill: Capillary refill takes less than 2 seconds.  Neurological:     Mental Status: She is alert and oriented to person, place, and time.  Psychiatric:        Attention and Perception: Attention normal.        Mood and Affect: Affect is flat.        Speech: Speech normal.        Behavior: Behavior normal. Behavior is cooperative.     PLAN Problem List Items Addressed This Visit     Depression with anxiety    Tocara's depressive symptoms are heightened today with a tearful discussion about the loneliness she is experiencing in her life. She describes the grief of the strained relationship with her daughter and  her sisters inability to spend time with her due to an ill husband. 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  She is emotional today, but no signs of severe symptoms.  I recommend increasing her dose of venlafaxine to help her get through this time.  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 We discussed how special this would be for so many of the residents and the service she could do for others.  We will follow-up in a few weeks to see how she is doing with this.       Relevant Medications   venlafaxine XR (EFFEXOR-XR) 75 MG 24 hr capsule   valsartan (DIOVAN) 40 MG tablet   Other Relevant Orders   Hemoglobin A1c (Completed)   CBC with Differential/Platelet (Completed)   Comprehensive metabolic panel  (Completed)   VITAMIN D 25 Hydroxy (Vit-D Deficiency, Fractures) (Completed)   TSH (Completed)   Class 1 obesity due to excess calories with serious comorbidity and body mass index (BMI) of 34.0 to 34.9 in adult - Primary    We discussed her weight today. It is challenging to help manage weight when chronic pain is present as activity can be limited. I would like to get some labs today to see if there are underlying factors that could be contributing to the changes she is noticing. We can consider medications based on these findings if insurance permits. I do not recommend phentermine as this is a stimulant that could very easily put additional strain on the heart and cause higher blood pressure readings. Given her history, I feel this is not a safe option.  For now I recommend monitoring of the diet and eating small meals with protein and low carbohydrates several times a day to keep blood sugar levels stable. Avoid juice, soda, and sweetened drinks and goods. Avoid skipping meals as this leads to increased weight.  We can consider GLP-1 if she qualifies.       Statin myopathy   Aortic atherosclerosis (HCC)   Relevant Medications   venlafaxine XR (EFFEXOR-XR) 75 MG 24 hr capsule   valsartan (DIOVAN) 40 MG tablet   aspirin EC 81 MG tablet   Other Relevant Orders   Hemoglobin A1c (Completed)   CBC with Differential/Platelet (Completed)   Comprehensive metabolic panel (Completed)   VITAMIN D 25 Hydroxy (Vit-D Deficiency, Fractures) (Completed)   TSH (Completed)   Mixed hyperlipidemia   Relevant Medications   venlafaxine XR (EFFEXOR-XR) 75 MG 24 hr capsule   valsartan (DIOVAN) 40 MG tablet   fluconazole (DIFLUCAN) 150 MG tablet   aspirin EC 81 MG tablet   Other Relevant Orders   Hemoglobin A1c (Completed)   CBC with Differential/Platelet (Completed)   Comprehensive metabolic panel (Completed)   VITAMIN D 25 Hydroxy (Vit-D Deficiency, Fractures) (Completed)   TSH (Completed)    Hypothyroidism   Relevant Medications   venlafaxine XR (EFFEXOR-XR) 75 MG 24 hr capsule   valsartan (DIOVAN) 40 MG tablet   levothyroxine (SYNTHROID) 50 MCG tablet   fluconazole (DIFLUCAN) 150 MG tablet   Other Relevant Orders   Hemoglobin A1c (Completed)   CBC with Differential/Platelet (Completed)   Comprehensive metabolic panel (Completed)   VITAMIN D 25 Hydroxy (Vit-D Deficiency, Fractures) (Completed)   TSH (Completed)   Osteopenia   Relevant Medications   fluconazole (DIFLUCAN) 150 MG tablet   Anemia   Relevant Medications   fluconazole (DIFLUCAN) 150 MG tablet  Insulin resistance   Relevant Medications   fluconazole (DIFLUCAN) 150 MG tablet   Yeast dermatitis   Relevant Medications   fluconazole (DIFLUCAN) 150 MG tablet   Degeneration of lumbar intervertebral disc   Relevant Medications   gabapentin (NEURONTIN) 300 MG capsule   cyclobenzaprine (FLEXERIL) 5 MG tablet   aspirin EC 81 MG tablet   Hypokalemia   Relevant Medications   fluconazole (DIFLUCAN) 150 MG tablet   Statin intolerance   Other Visit Diagnoses     Need for influenza vaccination       Relevant Orders   Flu Vaccine Trivalent High Dose (Fluad) (Completed)   Primary hypertension       Relevant Medications   venlafaxine XR (EFFEXOR-XR) 75 MG 24 hr capsule   valsartan (DIOVAN) 40 MG tablet   aspirin EC 81 MG tablet   Other Relevant Orders   Hemoglobin A1c (Completed)   CBC with Differential/Platelet (Completed)   Comprehensive metabolic panel (Completed)   VITAMIN D 25 Hydroxy (Vit-D Deficiency, Fractures) (Completed)   TSH (Completed)   Vitamin D deficiency       Relevant Medications   fluconazole (DIFLUCAN) 150 MG tablet       No follow-ups on file.  Time: 48 minutes, >50% spent counseling, care coordination, chart review, and documentation.   [name] Clamp, DNP, AGNP-c

## 2023-05-22 ENCOUNTER — Encounter: Payer: Self-pay | Admitting: Nurse Practitioner

## 2023-05-22 DIAGNOSIS — G720 Drug-induced myopathy: Secondary | ICD-10-CM | POA: Insufficient documentation

## 2023-05-22 LAB — COMPREHENSIVE METABOLIC PANEL
ALT: 14 IU/L (ref 0–32)
AST: 26 IU/L (ref 0–40)
Albumin: 4.3 g/dL (ref 3.8–4.8)
Alkaline Phosphatase: 119 IU/L (ref 44–121)
BUN/Creatinine Ratio: 14 (ref 12–28)
BUN: 13 mg/dL (ref 8–27)
Bilirubin Total: 0.4 mg/dL (ref 0.0–1.2)
CO2: 24 mmol/L (ref 20–29)
Calcium: 10.1 mg/dL (ref 8.7–10.3)
Chloride: 103 mmol/L (ref 96–106)
Creatinine, Ser: 0.96 mg/dL (ref 0.57–1.00)
Globulin, Total: 2.6 g/dL (ref 1.5–4.5)
Glucose: 91 mg/dL (ref 70–99)
Potassium: 4.4 mmol/L (ref 3.5–5.2)
Sodium: 142 mmol/L (ref 134–144)
Total Protein: 6.9 g/dL (ref 6.0–8.5)
eGFR: 62 mL/min/{1.73_m2} (ref >59)

## 2023-05-22 LAB — CBC WITH DIFFERENTIAL/PLATELET
Basophils Absolute: 0.1 10*3/uL (ref 0.0–0.2)
Basos: 1 %
EOS (ABSOLUTE): 0.2 10*3/uL (ref 0.0–0.4)
Eos: 3 %
Hematocrit: 42.7 % (ref 34.0–46.6)
Hemoglobin: 14.2 g/dL (ref 11.1–15.9)
Immature Grans (Abs): 0 10*3/uL (ref 0.0–0.1)
Immature Granulocytes: 1 %
Lymphocytes Absolute: 1.7 10*3/uL (ref 0.7–3.1)
Lymphs: 26 %
MCH: 30.5 pg (ref 26.6–33.0)
MCHC: 33.3 g/dL (ref 31.5–35.7)
MCV: 92 fL (ref 79–97)
Monocytes Absolute: 0.5 10*3/uL (ref 0.1–0.9)
Monocytes: 8 %
Neutrophils Absolute: 4 10*3/uL (ref 1.4–7.0)
Neutrophils: 61 %
Platelets: 281 10*3/uL (ref 150–450)
RBC: 4.66 x10E6/uL (ref 3.77–5.28)
RDW: 13.2 % (ref 11.7–15.4)
WBC: 6.4 10*3/uL (ref 3.4–10.8)

## 2023-05-22 LAB — HEMOGLOBIN A1C
Est. average glucose Bld gHb Est-mCnc: 111 mg/dL
Hgb A1c MFr Bld: 5.5 % (ref 4.8–5.6)

## 2023-05-22 LAB — VITAMIN D 25 HYDROXY (VIT D DEFICIENCY, FRACTURES): Vit D, 25-Hydroxy: 40.2 ng/mL (ref 30.0–100.0)

## 2023-05-22 LAB — TSH: TSH: 6.04 u[IU]/mL — ABNORMAL HIGH (ref 0.450–4.500)

## 2023-05-22 MED ORDER — ASPIRIN 81 MG PO TBEC
81.0000 mg | DELAYED_RELEASE_TABLET | Freq: Every day | ORAL | Status: DC
Start: 2023-05-22 — End: 2023-11-25

## 2023-05-22 NOTE — Assessment & Plan Note (Signed)
We discussed her weight today. It is challenging to help manage weight when chronic pain is present as activity can be limited. I would like to get some labs today to see if there are underlying factors that could be contributing to the changes she is noticing. We can consider medications based on these findings if insurance permits. I do not recommend phentermine as this is a stimulant that could very easily put additional strain on the heart and cause higher blood pressure readings. Given her history, I feel this is not a safe option.  For now I recommend monitoring of the diet and eating small meals with protein and low carbohydrates several times a day to keep blood sugar levels stable. Avoid juice, soda, and sweetened drinks and goods. Avoid skipping meals as this leads to increased weight.  We can consider GLP-1 if she qualifies.

## 2023-05-22 NOTE — Assessment & Plan Note (Signed)
Demetrius's depressive symptoms are heightened today with a tearful discussion about the loneliness she is experiencing in her life. She describes the grief of the strained relationship with her daughter and her sisters inability to spend time with her due to an ill husband. 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  She is emotional today, but no signs of severe symptoms.  I recommend increasing her dose of venlafaxine to help her get through this time.  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 We discussed how special this would be for so many of the residents and the service she could do for others.  We will follow-up in a few weeks to see how she is doing with this.

## 2023-05-23 ENCOUNTER — Other Ambulatory Visit: Payer: Self-pay | Admitting: Nurse Practitioner

## 2023-05-29 ENCOUNTER — Encounter: Payer: Self-pay | Admitting: Nurse Practitioner

## 2023-06-03 DIAGNOSIS — M5451 Vertebrogenic low back pain: Secondary | ICD-10-CM | POA: Diagnosis not present

## 2023-06-09 ENCOUNTER — Other Ambulatory Visit: Payer: Self-pay | Admitting: Nurse Practitioner

## 2023-06-09 DIAGNOSIS — E88819 Insulin resistance, unspecified: Secondary | ICD-10-CM

## 2023-06-09 DIAGNOSIS — M8580 Other specified disorders of bone density and structure, unspecified site: Secondary | ICD-10-CM

## 2023-06-09 DIAGNOSIS — E038 Other specified hypothyroidism: Secondary | ICD-10-CM

## 2023-06-09 DIAGNOSIS — E782 Mixed hyperlipidemia: Secondary | ICD-10-CM

## 2023-06-09 DIAGNOSIS — E876 Hypokalemia: Secondary | ICD-10-CM

## 2023-06-09 DIAGNOSIS — Z6832 Body mass index (BMI) 32.0-32.9, adult: Secondary | ICD-10-CM

## 2023-06-09 DIAGNOSIS — D508 Other iron deficiency anemias: Secondary | ICD-10-CM

## 2023-06-09 DIAGNOSIS — E559 Vitamin D deficiency, unspecified: Secondary | ICD-10-CM

## 2023-06-22 ENCOUNTER — Telehealth: Payer: Self-pay | Admitting: Nurse Practitioner

## 2023-06-22 DIAGNOSIS — I739 Peripheral vascular disease, unspecified: Secondary | ICD-10-CM

## 2023-06-22 DIAGNOSIS — E66811 Obesity, class 1: Secondary | ICD-10-CM

## 2023-06-22 NOTE — Telephone Encounter (Signed)
[name] Nolan can you see if you can get a GLP1 approved for this pt, [name] Nolan was denied a couple months But per [place] Dec the pt did have a finding of Aortoiliac atherosclerotic vascular disease in 2022  & this should be considered PAD, also noted she does not recommend phentermine as this is a stimulant that could very easily put additional strain on the heart and cause higher blood pressure readings. So given her history, she doesn't feel this is a safe option. Thanks

## 2023-06-23 ENCOUNTER — Telehealth: Payer: Self-pay

## 2023-06-23 NOTE — Telephone Encounter (Signed)
PA request for [name] Nolan has been submitted via CoverMyMeds and obesity/cardiac issues documented in request. Key is [id number], pending insurance response.

## 2023-06-23 NOTE — Progress Notes (Unsigned)
06/23/2023  Patient ID: [name] Nolan, female   DOB: 1946-07-18, 77 y.o.   MRN: [id number]  PA approved for [facility]. Copay of $50/month per test claim. Needing a new rx sent to her pharmacy

## 2023-06-27 NOTE — Telephone Encounter (Signed)
Reginal Lutes was approved, need new rx

## 2023-07-07 ENCOUNTER — Other Ambulatory Visit: Payer: Self-pay | Admitting: Nurse Practitioner

## 2023-07-07 DIAGNOSIS — E88819 Insulin resistance, unspecified: Secondary | ICD-10-CM

## 2023-07-07 DIAGNOSIS — E559 Vitamin D deficiency, unspecified: Secondary | ICD-10-CM

## 2023-07-07 DIAGNOSIS — E782 Mixed hyperlipidemia: Secondary | ICD-10-CM

## 2023-07-07 DIAGNOSIS — D508 Other iron deficiency anemias: Secondary | ICD-10-CM

## 2023-07-07 DIAGNOSIS — E063 Autoimmune thyroiditis: Secondary | ICD-10-CM

## 2023-07-07 DIAGNOSIS — M8580 Other specified disorders of bone density and structure, unspecified site: Secondary | ICD-10-CM

## 2023-07-07 DIAGNOSIS — B372 Candidiasis of skin and nail: Secondary | ICD-10-CM

## 2023-07-07 DIAGNOSIS — E876 Hypokalemia: Secondary | ICD-10-CM

## 2023-07-14 ENCOUNTER — Encounter: Payer: Self-pay | Admitting: Nurse Practitioner

## 2023-07-14 ENCOUNTER — Ambulatory Visit: Payer: Medicare PPO | Admitting: Nurse Practitioner

## 2023-07-14 VITALS — BP 132/80 | HR 80 | Wt 206.2 lb

## 2023-07-14 DIAGNOSIS — B372 Candidiasis of skin and nail: Secondary | ICD-10-CM

## 2023-07-14 DIAGNOSIS — I700 Atherosclerosis of aorta: Secondary | ICD-10-CM | POA: Diagnosis not present

## 2023-07-14 DIAGNOSIS — E66811 Obesity, class 1: Secondary | ICD-10-CM | POA: Diagnosis not present

## 2023-07-14 DIAGNOSIS — I739 Peripheral vascular disease, unspecified: Secondary | ICD-10-CM | POA: Insufficient documentation

## 2023-07-14 DIAGNOSIS — E063 Autoimmune thyroiditis: Secondary | ICD-10-CM | POA: Diagnosis not present

## 2023-07-14 DIAGNOSIS — M8580 Other specified disorders of bone density and structure, unspecified site: Secondary | ICD-10-CM

## 2023-07-14 DIAGNOSIS — E876 Hypokalemia: Secondary | ICD-10-CM

## 2023-07-14 DIAGNOSIS — E6609 Other obesity due to excess calories: Secondary | ICD-10-CM

## 2023-07-14 DIAGNOSIS — D508 Other iron deficiency anemias: Secondary | ICD-10-CM

## 2023-07-14 DIAGNOSIS — I10 Essential (primary) hypertension: Secondary | ICD-10-CM | POA: Diagnosis not present

## 2023-07-14 DIAGNOSIS — E782 Mixed hyperlipidemia: Secondary | ICD-10-CM | POA: Diagnosis not present

## 2023-07-14 DIAGNOSIS — E88819 Insulin resistance, unspecified: Secondary | ICD-10-CM

## 2023-07-14 DIAGNOSIS — E559 Vitamin D deficiency, unspecified: Secondary | ICD-10-CM | POA: Insufficient documentation

## 2023-07-14 MED ORDER — WEGOVY 1 MG/0.5ML ~~LOC~~ SOAJ: 1.0000 mg | SUBCUTANEOUS | 0 refills | Status: DC

## 2023-07-14 MED ORDER — LEVOTHYROXINE SODIUM 50 MCG PO TABS: 50.0000 ug | ORAL_TABLET | Freq: Every day | ORAL | Status: DC

## 2023-07-14 MED ORDER — WEGOVY 0.25 MG/0.5ML ~~LOC~~ SOAJ: 0.2500 mg | SUBCUTANEOUS | 0 refills | Status: DC

## 2023-07-14 MED ORDER — WEGOVY 0.5 MG/0.5ML ~~LOC~~ SOAJ: 0.5000 mg | SUBCUTANEOUS | 0 refills | Status: DC

## 2023-07-14 MED ORDER — FLUCONAZOLE 150 MG PO TABS
ORAL_TABLET | ORAL | 6 refills | Status: DC
Start: 2023-07-14 — End: 2023-11-25

## 2023-07-14 NOTE — Assessment & Plan Note (Signed)
Chronic issue, improved but not resolved with Fluconazole and Nystatin cream. -Refill Fluconazole prescription. -Continue Nystatin cream as needed.

## 2023-07-14 NOTE — Assessment & Plan Note (Signed)
>>  ASSESSMENT AND PLAN FOR HYPOTHYROIDISM WRITTEN ON 07/14/2023  7:53 PM BY Karenna Romanoff E, NP  Patient reports feeling hot and sweating excessively, which has improved but not resolved since adjusting Synthroid dosing. -Check thyroid levels to assess current status. -Consider increasing Synthroid dose if levels are still high.

## 2023-07-14 NOTE — Assessment & Plan Note (Signed)
Chronic. 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 Known atherosclerosis of the aorta present. Recommend strict dietary measures to aid in lowering lipids. I am concerned with her risks of CVD.  The 10-year ASCVD risk score (Arnett DK, et al., 2019) is: 17.2%

## 2023-07-14 NOTE — Assessment & Plan Note (Signed)
Therapist, occupational for [employer] received. -Start Wegovy injections.

## 2023-07-14 NOTE — Progress Notes (Signed)
[name] Clamp, DNP, AGNP-c [facility]  [address] [place], [place] [address] [phone]  ESTABLISHED PATIENT- Chronic Health and/or Follow-Up Visit  Blood pressure 132/80, pulse 80, weight 206 lb 3.2 oz (93.5 kg).    [name] Nolan is a 77 y.o. year old female presenting today for evaluation and management of chronic conditions.   History of Present Illness [name] Nolan reports experiencing persistent heat sensations, which she attributes to her thyroid condition. She notes that since adjusting her thyroid medication regimen to include a double dose on weekends, the heat sensations have somewhat improved but are still present. The patient describes excessive perspiration, to the point of needing to remove her glasses during physical activity. She also reports a persistent yeasty rash, which has improved but not resolved with fluconazole treatment. The patient has been using nystatin cream for symptom management.  The patient also reports discontinuing valsartan due to concerns about potential cancer risk. She reports that she read that this medication can cause cancer and decided that she did not need that risk.  She notes that the pharmacy told her her blood pressure readings were good.    The patient is also due to start [facility] treatment. She recently received approval from her insurance.   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 She describes physical exertion from unloading supplies and expresses some musculoskeletal discomfort as a result. However, she plans to continue volunteering in the future.  All ROS negative with exception of what is listed above.   PHYSICAL EXAM Physical Exam Vitals and nursing note reviewed.  Constitutional:      Appearance: Normal appearance.  HENT:     Head: Normocephalic.  Eyes:     Pupils: Pupils are equal, round, and reactive to light.   Neck:     Vascular: No carotid bruit.  Cardiovascular:     Rate and Rhythm: Normal rate and regular rhythm.     Pulses: Normal pulses.     Heart sounds: Normal heart sounds.  Pulmonary:     Effort: Pulmonary effort is normal.     Breath sounds: Normal breath sounds.  Musculoskeletal:        General: Normal range of motion.     Cervical back: Normal range of motion. No tenderness.     Right lower leg: No edema.     Left lower leg: No edema.  Lymphadenopathy:     Cervical: No cervical adenopathy.  Skin:    General: Skin is warm and dry.  Neurological:     General: No focal deficit present.     Mental Status: She is alert and oriented to person, place, and time.  Psychiatric:        Mood and Affect: Mood normal.      PLAN Problem List Items Addressed This Visit     Mixed hyperlipidemia    Chronic. 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 Known atherosclerosis of the aorta present. Recommend strict dietary measures to aid in lowering lipids. I am concerned with her risks of CVD.  The 10-year ASCVD risk score (Arnett DK, et al., 2019) is: 17.2%       Relevant Medications   Semaglutide-Weight Management (WEGOVY) 0.25 MG/0.5ML SOAJ   Semaglutide-Weight Management (WEGOVY) 0.5 MG/0.5ML SOAJ   Semaglutide-Weight Management (WEGOVY) 1 MG/0.5ML SOAJ   fluconazole (DIFLUCAN) 150 MG tablet   Yeast dermatitis    Chronic issue, improved but not resolved with Fluconazole and Nystatin cream. -Refill Fluconazole prescription. -  Continue Nystatin cream as needed.      Relevant Medications   fluconazole (DIFLUCAN) 150 MG tablet   Hypothyroidism due to Hashimoto's thyroiditis    >>ASSESSMENT AND PLAN FOR HYPOTHYROIDISM WRITTEN ON 07/14/2023  7:53 PM BY Delshawn Stech E, NP  Patient reports feeling hot and sweating excessively, which has improved but not resolved since adjusting Synthroid dosing. -Check thyroid levels to assess current status. -Consider increasing Synthroid dose if levels are  still high.      Relevant Medications   fluconazole (DIFLUCAN) 150 MG tablet   levothyroxine (SYNTHROID) 50 MCG tablet   Class 1 obesity due to excess calories with serious comorbidity and body mass index (BMI) of 34.0 to 34.9 in adult    Insurance approval for [place] received. -Start Wegovy injections.      Relevant Medications   Semaglutide-Weight Management (WEGOVY) 0.25 MG/0.5ML SOAJ   Semaglutide-Weight Management (WEGOVY) 0.5 MG/0.5ML SOAJ   Semaglutide-Weight Management (WEGOVY) 1 MG/0.5ML SOAJ   Aortic atherosclerosis (HCC)    Chronic. Strict blood pressure and lipid control for management recommended to reduce risks of heart attack and stroke.       Relevant Medications   Semaglutide-Weight Management (WEGOVY) 0.25 MG/0.5ML SOAJ   Semaglutide-Weight Management (WEGOVY) 0.5 MG/0.5ML SOAJ   Semaglutide-Weight Management (WEGOVY) 1 MG/0.5ML SOAJ   Primary hypertension    Patient stopped Valsartan due to concerns about potential cancer risk. Blood pressure reading of 132/80, which is above the goal of less than 120/80. -Monitor blood pressure closely, especially with iincreased CVD risks. -Consider discussing alternative antihypertensive medications if blood pressure remains elevated.      Relevant Medications   Semaglutide-Weight Management (WEGOVY) 0.25 MG/0.5ML SOAJ   Semaglutide-Weight Management (WEGOVY) 0.5 MG/0.5ML SOAJ   Semaglutide-Weight Management (WEGOVY) 1 MG/0.5ML SOAJ   PAD (peripheral artery disease) (HCC)   Relevant Medications   Semaglutide-Weight Management (WEGOVY) 0.25 MG/0.5ML SOAJ   Semaglutide-Weight Management (WEGOVY) 0.5 MG/0.5ML SOAJ   Semaglutide-Weight Management (WEGOVY) 1 MG/0.5ML SOAJ   Vitamin D deficiency   Relevant Medications   fluconazole (DIFLUCAN) 150 MG tablet   Osteopenia   Relevant Medications   fluconazole (DIFLUCAN) 150 MG tablet   Anemia   Relevant Medications   fluconazole (DIFLUCAN) 150 MG tablet   Insulin resistance    Relevant Medications   Semaglutide-Weight Management (WEGOVY) 0.25 MG/0.5ML SOAJ   Semaglutide-Weight Management (WEGOVY) 0.5 MG/0.5ML SOAJ   Semaglutide-Weight Management (WEGOVY) 1 MG/0.5ML SOAJ   fluconazole (DIFLUCAN) 150 MG tablet   Hypokalemia   Relevant Medications   fluconazole (DIFLUCAN) 150 MG tablet   Other Visit Diagnoses     Hypothyroidism due to Hashimoto thyroiditis    -  Primary   Relevant Medications   levothyroxine (SYNTHROID) 50 MCG tablet   Other Relevant Orders   TSH       Return in about 4 months (around 11/13/2023) for Med Management 30.  [name] Clamp, DNP, AGNP-c

## 2023-07-14 NOTE — Patient Instructions (Addendum)
We will see what your blood pressure looks like at your next visit to see if we need to consider a different medication.     WEIGHT LOSS PLANNING  For best management of weight, it is vital to balance intake versus output. This means the number of calories burned per day must be less than the calories you take in with food and drink.   I recommend trying to follow a diet with the following: Calories: 1200-1500 calories per day Carbohydrates: 150-180 grams of carbohydrates per day  Why: Gives your body enough "quick fuel" for cells to maintain normal function without sending them into starvation mode.  Protein: At least 90 grams of protein per day- 30 grams with each meal Why: Protein takes longer and uses more energy than carbohydrates to break down for fuel. The carbohydrates in your meals serves as quick energy sources and proteins help use some of that extra quick energy to break down to produce long term energy. This helps you not feel hungry as quickly and protein breakdown burns calories.  Water: Drink AT LEAST 64 ounces of water per day  Why: Water is essential to healthy metabolism. Water helps to fill the stomach and keep you fuller longer. Water is required for healthy digestion and filtering of waste in the body.  Fat: Limit fats in your diet- when choosing fats, choose foods with lower fats content such as lean meats (chicken, fish, [place]).  Why: Increased fat intake leads to storage "for later". Once you burn your carbohydrate energy, your body goes into fat and protein breakdown mode to help you loose weight.  Cholesterol: Fats and oils that are LIQUID at room temperature are best. Choose vegetable oils (olive oil, avocado oil, nuts). Avoid fats that are SOLID at room temperature (animal fats, processed meats). Healthy fats are often found in whole grains, beans, nuts, seeds, and berries.  Why: Elevated cholesterol levels lead to build up of cholesterol on the inside of your blood  vessels. This will eventually cause the blood vessels to become hard and can lead to high blood pressure and damage to your organs. When the blood flow is reduced, but the pressure is high from cholesterol buildup, parts of the cholesterol can break off and form clots that can go to the brain or heart leading to a stroke or heart attack.  Fiber: Increase amount of SOLUBLE the fiber in your diet. This helps to fill you up, lowers cholesterol, and helps with digestion. Some foods high in soluble fiber are oats, peas, beans, apples, carrots, barley, and citrus fruits.   Why: Fiber fills you up, helps remove excess cholesterol, and aids in healthy digestion which are all very important in weight management.   I recommend the following as a minimum activity routine: Purposeful walk or other physical activity at least 20 minutes every single day. This means purposefully taking a walk, jog, bike, swim, treadmill, elliptical, dance, etc.  This activity should be ABOVE your normal daily activities, such as walking at work. Goal exercise should be at least 150 minutes a week- work your way up to this.   Heart Rate: Your maximum exercise heart rate should be 220 - Your Age in Years. When exercising, get your heart rate up, but avoid going over the maximum targeted heart rate.  60-70% of your maximum heart rate is where you tend to burn the most fat. To find this number:  220 - Age In Years= Max HR  Max HR x 0.6 (  or 0.7) = Fat Burning HR The Fat Burning HR is your goal heart rate while working out to burn the most fat.  NEVER exercise to the point your feel lightheaded, weak, nauseated, dizzy. If you experience ANY of these symptoms- STOP exercise! Allow yourself to cool down and your heart rate to come down. Then restart slower next time.  If at ANY TIME you feel chest pain or chest pressure during exercise, STOP IMMEDIATELY and seek medical attention.

## 2023-07-14 NOTE — Assessment & Plan Note (Signed)
Chronic. Strict blood pressure and lipid control for management recommended to reduce risks of heart attack and stroke.

## 2023-07-14 NOTE — Assessment & Plan Note (Signed)
Patient stopped Valsartan due to concerns about potential cancer risk. Blood pressure reading of 132/80, which is above the goal of less than 120/80. -Monitor blood pressure closely, especially with iincreased CVD risks. -Consider discussing alternative antihypertensive medications if blood pressure remains elevated.

## 2023-07-14 NOTE — Assessment & Plan Note (Signed)
Patient reports feeling hot and sweating excessively, which has improved but not resolved since adjusting Synthroid dosing. -Check thyroid levels to assess current status. -Consider increasing Synthroid dose if levels are still high.

## 2023-07-15 LAB — TSH: TSH: 1.91 u[IU]/mL (ref 0.450–4.500)

## 2023-07-19 ENCOUNTER — Encounter: Payer: Self-pay | Admitting: Nurse Practitioner

## 2023-07-24 ENCOUNTER — Encounter: Payer: Self-pay | Admitting: Nurse Practitioner

## 2023-07-24 ENCOUNTER — Other Ambulatory Visit: Payer: Self-pay | Admitting: Nurse Practitioner

## 2023-08-04 ENCOUNTER — Ambulatory Visit: Payer: Medicare PPO | Admitting: Neurology

## 2023-08-04 VITALS — BP 152/103 | HR 73

## 2023-08-04 DIAGNOSIS — G43719 Chronic migraine without aura, intractable, without status migrainosus: Secondary | ICD-10-CM | POA: Diagnosis not present

## 2023-08-04 MED ORDER — ONABOTULINUMTOXINA 200 UNITS IJ SOLR
155.0000 [IU] | Freq: Once | INTRAMUSCULAR | Status: AC
  Administered 2023-08-04: 155 [IU] via INTRAMUSCULAR

## 2023-08-04 NOTE — Progress Notes (Signed)
Consent Form Botulism Toxin Injection For Chronic Migraine  08/04/2023 here for botox injections, she declined rescheduling and would like botox today despite having nausea and diarrhea. Patient with diarrhea, does not look comfortable, nauea for weeks, I jighly encouraged even demanded she go to the ED or see her primary care and she refused multiple times, explained the consequences including dehydration, hypotension, AKI, decreased oxygenation(she is breathing havily) and if she does not go to her pcp today she will end up in the hospital, I pleaded with her and her escort and declined I also told her if this is infectious she could be giving it to other peopl and she still declined advised her to call 911 for worsening.   05/05/2023: stable, doing well 02/03/2023: stable; stable: Doing extremely well at least 70% improvement continues in migraine frequency. She is not a Garment/textile technologist. Gave a medrol dosepak in case she has a right-sided occipital nerve attack and we are not here to provide nerve block, keep it a drawer and start right away 11/11/2022: stable  08/19/2022: stable: Doing extremely well at least 70% improvement continues in migraine frequency. She is not a Garment/textile technologist. Gave a medrol dosepak in case she has a right-sided occipital nerve attack and we are not here to provide nerve block, keep it a drawer and start right away  05/27/2022: stable, doing well 03/02/2022: Stable but under lots of stress 12/07/2021: Stable 09/03/2021 stable: Doing extremely well at least 70% improvement continues in migraine frequency. She is not a Garment/textile technologist.   Reviewed orally with patient, additionally signature is on file:  Botulism toxin has been approved by the Federal drug administration for treatment of chronic migraine. Botulism toxin does not cure chronic migraine and it may not be effective in some patients.  The administration of botulism toxin is accomplished by injecting a small amount of toxin into the  muscles of the neck and head. Dosage must be titrated for each individual. Any benefits resulting from botulism toxin tend to wear off after 3 months with a repeat injection required if benefit is to be maintained. Injections are usually done every 3-4 months with maximum effect peak achieved by about 2 or 3 weeks. Botulism toxin is expensive and you should be sure of what costs you will incur resulting from the injection.  The side effects of botulism toxin use for chronic migraine may include:   -Transient, and usually mild, facial weakness with facial injections  -Transient, and usually mild, head or neck weakness with head/neck injections  -Reduction or loss of forehead facial animation due to forehead muscle weakness  -Eyelid drooping  -Dry eye  -Pain at the site of injection or bruising at the site of injection  -Double vision  -Potential unknown long term risks  Contraindications: You should not have Botox if you are pregnant, nursing, allergic to albumin, have an infection, skin condition, or muscle weakness at the site of the injection, or have myasthenia gravis, Lambert-Eaton syndrome, or ALS.  It is also possible that as with any injection, there may be an allergic reaction or no effect from the medication. Reduced effectiveness after repeated injections is sometimes seen and rarely infection at the injection site may occur. All care will be taken to prevent these side effects. If therapy is given over a long time, atrophy and wasting in the muscle injected may occur. Occasionally the patient's become refractory to treatment because they develop antibodies to the toxin. In this event, therapy needs to be modified.  I  have read the above information and consent to the administration of botulism toxin.    BOTOX PROCEDURE NOTE FOR MIGRAINE HEADACHE    Contraindications and precautions discussed with patient(above). Aseptic procedure was observed and patient tolerated procedure.  Procedure performed by Dr. Artemio Aly  The condition has existed for more than 6 months, and pt does not have a diagnosis of ALS, Myasthenia Gravis or Lambert-Eaton Syndrome.  Risks and benefits of injections discussed and pt agrees to proceed with the procedure.  Written consent obtained  These injections are medically necessary. Pt  receives good benefits from these injections. These injections do not cause sedations or hallucinations which the oral therapies may cause.  Description of procedure:  The patient was placed in a sitting position. The standard protocol was used for Botox as follows, with 5 units of Botox injected at each site:   -Procerus muscle, midline injection  -Corrugator muscle, bilateral injection  -Frontalis muscle, bilateral injection, with 2 sites each side, medial injection was performed in the upper one third of the frontalis muscle, in the region vertical from the medial inferior edge of the superior orbital rim. The lateral injection was again in the upper one third of the forehead vertically above the lateral limbus of the cornea, 1.5 cm lateral to the medial injection site.  -Temporalis muscle injection, 4 sites, bilaterally. The first injection was 3 cm above the tragus of the ear, second injection site was 1.5 cm to 3 cm up from the first injection site in line with the tragus of the ear. The third injection site was 1.5-3 cm forward between the first 2 injection sites. The fourth injection site was 1.5 cm posterior to the second injection site.   -Occipitalis muscle injection, 3 sites, bilaterally. The first injection was done one half way between the occipital protuberance and the tip of the mastoid process behind the ear. The second injection site was done lateral and superior to the first, 1 fingerbreadth from the first injection. The third injection site was 1 fingerbreadth superiorly and medially from the first injection site.  -Cervical paraspinal muscle  injection, 2 sites, bilateral knee first injection site was 1 cm from the midline of the cervical spine, 3 cm inferior to the lower border of the occipital protuberance. The second injection site was 1.5 cm superiorly and laterally to the first injection site.  -Trapezius muscle injection was performed at 3 sites, bilaterally. The first injection site was in the upper trapezius muscle halfway between the inflection point of the neck, and the acromion. The second injection site was one half way between the acromion and the first injection site. The third injection was done between the first injection site and the inflection point of the neck.   Will return for repeat injection in 3 months.   155 units of Botox was used, 45u Botox not injected was wasted. The patient tolerated the procedure well, there were no complications of the above procedure.

## 2023-08-04 NOTE — Progress Notes (Signed)
Botox- 200 units x 1 vial Lot: [id number] Expiration: 12/15/2023 NDC: 1478-2956-21  Bacteriostatic 0.9% Sodium Chloride- 4 mL  Lot: [id number] Expiration: 8469-6295-28 NDC::0409-1966-02   Dx: G43.719 B/B Witnessed by Delmer Islam

## 2023-08-31 ENCOUNTER — Other Ambulatory Visit: Payer: Self-pay | Admitting: Nurse Practitioner

## 2023-08-31 DIAGNOSIS — E782 Mixed hyperlipidemia: Secondary | ICD-10-CM

## 2023-08-31 DIAGNOSIS — E6609 Other obesity due to excess calories: Secondary | ICD-10-CM

## 2023-08-31 DIAGNOSIS — I700 Atherosclerosis of aorta: Secondary | ICD-10-CM

## 2023-08-31 DIAGNOSIS — I739 Peripheral vascular disease, unspecified: Secondary | ICD-10-CM

## 2023-08-31 DIAGNOSIS — E88819 Insulin resistance, unspecified: Secondary | ICD-10-CM

## 2023-08-31 DIAGNOSIS — I10 Essential (primary) hypertension: Secondary | ICD-10-CM

## 2023-08-31 DIAGNOSIS — E66811 Obesity, class 1: Secondary | ICD-10-CM

## 2023-09-01 ENCOUNTER — Other Ambulatory Visit: Payer: Self-pay

## 2023-09-01 DIAGNOSIS — I10 Essential (primary) hypertension: Secondary | ICD-10-CM

## 2023-09-01 DIAGNOSIS — I739 Peripheral vascular disease, unspecified: Secondary | ICD-10-CM

## 2023-09-01 DIAGNOSIS — E88819 Insulin resistance, unspecified: Secondary | ICD-10-CM

## 2023-09-01 DIAGNOSIS — Z6834 Body mass index (BMI) 34.0-34.9, adult: Secondary | ICD-10-CM

## 2023-09-01 DIAGNOSIS — I700 Atherosclerosis of aorta: Secondary | ICD-10-CM

## 2023-09-01 DIAGNOSIS — E782 Mixed hyperlipidemia: Secondary | ICD-10-CM

## 2023-09-01 MED ORDER — WEGOVY 0.5 MG/0.5ML ~~LOC~~ SOAJ: 0.5000 mg | SUBCUTANEOUS | 0 refills | Status: DC

## 2023-10-14 ENCOUNTER — Telehealth: Payer: Self-pay | Admitting: Neurology

## 2023-10-14 NOTE — Telephone Encounter (Signed)
Submitted auth request to [facility] via [facility], status is pending. Key: [id number]

## 2023-10-16 NOTE — Telephone Encounter (Signed)
Received approval, pt will continue to be buy/bill.  auth#: [id number] (10/05/19-09/14/24)

## 2023-10-20 DIAGNOSIS — M545 Low back pain, unspecified: Secondary | ICD-10-CM | POA: Diagnosis not present

## 2023-10-20 DIAGNOSIS — M25562 Pain in left knee: Secondary | ICD-10-CM | POA: Diagnosis not present

## 2023-10-20 DIAGNOSIS — M25561 Pain in right knee: Secondary | ICD-10-CM | POA: Diagnosis not present

## 2023-10-27 ENCOUNTER — Other Ambulatory Visit: Payer: Self-pay | Admitting: Nurse Practitioner

## 2023-10-27 DIAGNOSIS — Z6832 Body mass index (BMI) 32.0-32.9, adult: Secondary | ICD-10-CM

## 2023-10-27 DIAGNOSIS — M8580 Other specified disorders of bone density and structure, unspecified site: Secondary | ICD-10-CM

## 2023-10-27 DIAGNOSIS — E559 Vitamin D deficiency, unspecified: Secondary | ICD-10-CM

## 2023-10-27 DIAGNOSIS — D508 Other iron deficiency anemias: Secondary | ICD-10-CM

## 2023-10-27 DIAGNOSIS — E782 Mixed hyperlipidemia: Secondary | ICD-10-CM

## 2023-10-27 DIAGNOSIS — E063 Autoimmune thyroiditis: Secondary | ICD-10-CM

## 2023-10-27 DIAGNOSIS — E88819 Insulin resistance, unspecified: Secondary | ICD-10-CM

## 2023-10-27 DIAGNOSIS — E876 Hypokalemia: Secondary | ICD-10-CM

## 2023-11-02 ENCOUNTER — Ambulatory Visit (INDEPENDENT_AMBULATORY_CARE_PROVIDER_SITE_OTHER): Payer: Medicare PPO | Admitting: Neurology

## 2023-11-02 ENCOUNTER — Encounter: Payer: Self-pay | Admitting: Neurology

## 2023-11-02 DIAGNOSIS — G43719 Chronic migraine without aura, intractable, without status migrainosus: Secondary | ICD-10-CM

## 2023-11-02 DIAGNOSIS — M5481 Occipital neuralgia: Secondary | ICD-10-CM

## 2023-11-02 DIAGNOSIS — R519 Headache, unspecified: Secondary | ICD-10-CM

## 2023-11-02 MED ORDER — ONABOTULINUMTOXINA 100 UNITS IJ SOLR
155.0000 [IU] | Freq: Once | INTRAMUSCULAR | Status: AC
  Administered 2023-11-02: 155 [IU] via INTRAMUSCULAR

## 2023-11-02 NOTE — Progress Notes (Signed)
Botox- 100 units x 2 vials Lot: [id number] Expiration: 01/2026 NDC: 8469-6295-28  Bacteriostatic 0.9% Sodium Chloride- 2 mL  Lot: [id number] Expiration: 09/16/2023 NDC: 0102-7253-66  Dx: 43.709 B/B Witnessed by Alverda Skeans, RN

## 2023-11-02 NOTE — Progress Notes (Signed)
Consent Form Botulism Toxin Injection For Chronic Migraine  11/02/2023: stable 08/04/2023 here for botox injections, she declined rescheduling and would like botox today despite having nausea and diarrhea. Patient with diarrhea, does not look comfortable, nauea for weeks, I jighly encouraged even demanded she go to the ED or see her primary care and she refused multiple times, explained the consequences including dehydration, hypotension, AKI, decreased oxygenation(she is breathing havily) and if she does not go to her pcp today she will end up in the hospital, I pleaded with her and her escort and declined I also told her if this is infectious she could be giving it to other peopl and she still declined advised her to call 911 for worsening.   05/05/2023: stable, doing well 02/03/2023: stable; stable: Doing extremely well at least 70% improvement continues in migraine frequency. She is not a Garment/textile technologist. Gave a medrol dosepak in case she has a right-sided occipital nerve attack and we are not here to provide nerve block, keep it a drawer and start right away 11/11/2022: stable  08/19/2022: stable: Doing extremely well at least 70% improvement continues in migraine frequency. She is not a Garment/textile technologist. Gave a medrol dosepak in case she has a right-sided occipital nerve attack and we are not here to provide nerve block, keep it a drawer and start right away  05/27/2022: stable, doing well 03/02/2022: Stable but under lots of stress 12/07/2021: Stable 09/03/2021 stable: Doing extremely well at least 70% improvement continues in migraine frequency. She is not a Garment/textile technologist.   Reviewed orally with patient, additionally signature is on file:  Botulism toxin has been approved by the Federal drug administration for treatment of chronic migraine. Botulism toxin does not cure chronic migraine and it may not be effective in some patients.  The administration of botulism toxin is accomplished by injecting a small amount of  toxin into the muscles of the neck and head. Dosage must be titrated for each individual. Any benefits resulting from botulism toxin tend to wear off after 3 months with a repeat injection required if benefit is to be maintained. Injections are usually done every 3-4 months with maximum effect peak achieved by about 2 or 3 weeks. Botulism toxin is expensive and you should be sure of what costs you will incur resulting from the injection.  The side effects of botulism toxin use for chronic migraine may include:   -Transient, and usually mild, facial weakness with facial injections  -Transient, and usually mild, head or neck weakness with head/neck injections  -Reduction or loss of forehead facial animation due to forehead muscle weakness  -Eyelid drooping  -Dry eye  -Pain at the site of injection or bruising at the site of injection  -Double vision  -Potential unknown long term risks  Contraindications: You should not have Botox if you are pregnant, nursing, allergic to albumin, have an infection, skin condition, or muscle weakness at the site of the injection, or have myasthenia gravis, Lambert-Eaton syndrome, or ALS.  It is also possible that as with any injection, there may be an allergic reaction or no effect from the medication. Reduced effectiveness after repeated injections is sometimes seen and rarely infection at the injection site may occur. All care will be taken to prevent these side effects. If therapy is given over a long time, atrophy and wasting in the muscle injected may occur. Occasionally the patient's become refractory to treatment because they develop antibodies to the toxin. In this event, therapy needs to be modified.  I have read the above information and consent to the administration of botulism toxin.    BOTOX PROCEDURE NOTE FOR MIGRAINE HEADACHE    Contraindications and precautions discussed with patient(above). Aseptic procedure was observed and patient tolerated  procedure. Procedure performed by Dr. Artemio Aly  The condition has existed for more than 6 months, and pt does not have a diagnosis of ALS, Myasthenia Gravis or Lambert-Eaton Syndrome.  Risks and benefits of injections discussed and pt agrees to proceed with the procedure.  Written consent obtained  These injections are medically necessary. Pt  receives good benefits from these injections. These injections do not cause sedations or hallucinations which the oral therapies may cause.  Description of procedure:  The patient was placed in a sitting position. The standard protocol was used for Botox as follows, with 5 units of Botox injected at each site:   -Procerus muscle, midline injection  -Corrugator muscle, bilateral injection  -Frontalis muscle, bilateral injection, with 2 sites each side, medial injection was performed in the upper one third of the frontalis muscle, in the region vertical from the medial inferior edge of the superior orbital rim. The lateral injection was again in the upper one third of the forehead vertically above the lateral limbus of the cornea, 1.5 cm lateral to the medial injection site.  -Temporalis muscle injection, 4 sites, bilaterally. The first injection was 3 cm above the tragus of the ear, second injection site was 1.5 cm to 3 cm up from the first injection site in line with the tragus of the ear. The third injection site was 1.5-3 cm forward between the first 2 injection sites. The fourth injection site was 1.5 cm posterior to the second injection site.   -Occipitalis muscle injection, 3 sites, bilaterally. The first injection was done one half way between the occipital protuberance and the tip of the mastoid process behind the ear. The second injection site was done lateral and superior to the first, 1 fingerbreadth from the first injection. The third injection site was 1 fingerbreadth superiorly and medially from the first injection site.  -Cervical  paraspinal muscle injection, 2 sites, bilateral knee first injection site was 1 cm from the midline of the cervical spine, 3 cm inferior to the lower border of the occipital protuberance. The second injection site was 1.5 cm superiorly and laterally to the first injection site.  -Trapezius muscle injection was performed at 3 sites, bilaterally. The first injection site was in the upper trapezius muscle halfway between the inflection point of the neck, and the acromion. The second injection site was one half way between the acromion and the first injection site. The third injection was done between the first injection site and the inflection point of the neck.   Will return for repeat injection in 3 months.   155 units of Botox was used, 45u Botox not injected was wasted. The patient tolerated the procedure well, there were no complications of the above procedure.

## 2023-11-05 ENCOUNTER — Telehealth: Payer: Self-pay | Admitting: Neurology

## 2023-11-05 NOTE — Telephone Encounter (Signed)
Pt asking if needs to come for Botox in July because Dr. Lucia Gaskins going to be out the whole month of August. Would like a call back

## 2023-11-05 NOTE — Telephone Encounter (Signed)
Discussed w pt via MyChart, Botox is scheduled in May and OV is scheduled in July.

## 2023-11-16 ENCOUNTER — Encounter: Payer: Medicare PPO | Admitting: Nurse Practitioner

## 2023-11-18 DIAGNOSIS — M545 Low back pain, unspecified: Secondary | ICD-10-CM | POA: Diagnosis not present

## 2023-11-22 ENCOUNTER — Emergency Department (HOSPITAL_BASED_OUTPATIENT_CLINIC_OR_DEPARTMENT_OTHER)
Admission: EM | Admit: 2023-11-22 | Discharge: 2023-11-22 | Disposition: A | Discharge status: Home / Self Care | Attending: Emergency Medicine | Admitting: Emergency Medicine

## 2023-11-22 ENCOUNTER — Emergency Department (HOSPITAL_BASED_OUTPATIENT_CLINIC_OR_DEPARTMENT_OTHER): Admitting: Radiology

## 2023-11-22 ENCOUNTER — Encounter: Payer: Self-pay | Admitting: Nurse Practitioner

## 2023-11-22 ENCOUNTER — Emergency Department (HOSPITAL_BASED_OUTPATIENT_CLINIC_OR_DEPARTMENT_OTHER)

## 2023-11-22 ENCOUNTER — Encounter (HOSPITAL_BASED_OUTPATIENT_CLINIC_OR_DEPARTMENT_OTHER): Payer: Self-pay | Admitting: Emergency Medicine

## 2023-11-22 DIAGNOSIS — R7401 Elevation of levels of liver transaminase levels: Secondary | ICD-10-CM | POA: Diagnosis not present

## 2023-11-22 DIAGNOSIS — R918 Other nonspecific abnormal finding of lung field: Secondary | ICD-10-CM | POA: Diagnosis not present

## 2023-11-22 DIAGNOSIS — Z85828 Personal history of other malignant neoplasm of skin: Secondary | ICD-10-CM | POA: Diagnosis not present

## 2023-11-22 DIAGNOSIS — M8580 Other specified disorders of bone density and structure, unspecified site: Secondary | ICD-10-CM | POA: Diagnosis present

## 2023-11-22 DIAGNOSIS — E039 Hypothyroidism, unspecified: Secondary | ICD-10-CM | POA: Insufficient documentation

## 2023-11-22 DIAGNOSIS — R110 Nausea: Secondary | ICD-10-CM | POA: Diagnosis not present

## 2023-11-22 DIAGNOSIS — R5381 Other malaise: Secondary | ICD-10-CM | POA: Diagnosis not present

## 2023-11-22 DIAGNOSIS — K219 Gastro-esophageal reflux disease without esophagitis: Secondary | ICD-10-CM | POA: Diagnosis present

## 2023-11-22 DIAGNOSIS — E876 Hypokalemia: Secondary | ICD-10-CM | POA: Diagnosis present

## 2023-11-22 DIAGNOSIS — F419 Anxiety disorder, unspecified: Secondary | ICD-10-CM | POA: Diagnosis present

## 2023-11-22 DIAGNOSIS — G43909 Migraine, unspecified, not intractable, without status migrainosus: Secondary | ICD-10-CM | POA: Diagnosis present

## 2023-11-22 DIAGNOSIS — E782 Mixed hyperlipidemia: Secondary | ICD-10-CM | POA: Diagnosis present

## 2023-11-22 DIAGNOSIS — Z79899 Other long term (current) drug therapy: Secondary | ICD-10-CM | POA: Insufficient documentation

## 2023-11-22 DIAGNOSIS — Z1152 Encounter for screening for COVID-19: Secondary | ICD-10-CM | POA: Diagnosis not present

## 2023-11-22 DIAGNOSIS — R945 Abnormal results of liver function studies: Secondary | ICD-10-CM | POA: Insufficient documentation

## 2023-11-22 DIAGNOSIS — R062 Wheezing: Secondary | ICD-10-CM | POA: Diagnosis not present

## 2023-11-22 DIAGNOSIS — R0602 Shortness of breath: Secondary | ICD-10-CM | POA: Diagnosis not present

## 2023-11-22 DIAGNOSIS — J984 Other disorders of lung: Secondary | ICD-10-CM | POA: Diagnosis not present

## 2023-11-22 DIAGNOSIS — E860 Dehydration: Secondary | ICD-10-CM | POA: Diagnosis present

## 2023-11-22 DIAGNOSIS — R1084 Generalized abdominal pain: Secondary | ICD-10-CM | POA: Diagnosis not present

## 2023-11-22 DIAGNOSIS — J168 Pneumonia due to other specified infectious organisms: Secondary | ICD-10-CM | POA: Insufficient documentation

## 2023-11-22 DIAGNOSIS — J189 Pneumonia, unspecified organism: Secondary | ICD-10-CM

## 2023-11-22 DIAGNOSIS — R6520 Severe sepsis without septic shock: Secondary | ICD-10-CM | POA: Diagnosis present

## 2023-11-22 DIAGNOSIS — Z9049 Acquired absence of other specified parts of digestive tract: Secondary | ICD-10-CM | POA: Diagnosis not present

## 2023-11-22 DIAGNOSIS — I10 Essential (primary) hypertension: Secondary | ICD-10-CM | POA: Insufficient documentation

## 2023-11-22 DIAGNOSIS — E063 Autoimmune thyroiditis: Secondary | ICD-10-CM | POA: Diagnosis present

## 2023-11-22 DIAGNOSIS — I771 Stricture of artery: Secondary | ICD-10-CM | POA: Diagnosis not present

## 2023-11-22 DIAGNOSIS — Z66 Do not resuscitate: Secondary | ICD-10-CM | POA: Diagnosis present

## 2023-11-22 DIAGNOSIS — J40 Bronchitis, not specified as acute or chronic: Secondary | ICD-10-CM | POA: Diagnosis present

## 2023-11-22 DIAGNOSIS — E66811 Obesity, class 1: Secondary | ICD-10-CM | POA: Diagnosis present

## 2023-11-22 DIAGNOSIS — A419 Sepsis, unspecified organism: Secondary | ICD-10-CM | POA: Diagnosis present

## 2023-11-22 DIAGNOSIS — D709 Neutropenia, unspecified: Secondary | ICD-10-CM | POA: Diagnosis present

## 2023-11-22 DIAGNOSIS — I739 Peripheral vascular disease, unspecified: Secondary | ICD-10-CM | POA: Diagnosis present

## 2023-11-22 DIAGNOSIS — Z7985 Long-term (current) use of injectable non-insulin antidiabetic drugs: Secondary | ICD-10-CM | POA: Diagnosis not present

## 2023-11-22 DIAGNOSIS — Z8249 Family history of ischemic heart disease and other diseases of the circulatory system: Secondary | ICD-10-CM | POA: Diagnosis not present

## 2023-11-22 DIAGNOSIS — R7989 Other specified abnormal findings of blood chemistry: Secondary | ICD-10-CM

## 2023-11-22 DIAGNOSIS — Z79890 Hormone replacement therapy (postmenopausal): Secondary | ICD-10-CM | POA: Diagnosis not present

## 2023-11-22 DIAGNOSIS — F32A Depression, unspecified: Secondary | ICD-10-CM | POA: Diagnosis present

## 2023-11-22 DIAGNOSIS — E872 Acidosis, unspecified: Secondary | ICD-10-CM | POA: Diagnosis present

## 2023-11-22 LAB — URINALYSIS, ROUTINE W REFLEX MICROSCOPIC
Bacteria, UA: NONE SEEN
Bilirubin Urine: NEGATIVE
Glucose, UA: NEGATIVE mg/dL
Ketones, ur: NEGATIVE mg/dL
Nitrite: NEGATIVE
Protein, ur: NEGATIVE mg/dL
Specific Gravity, Urine: 1.006 (ref 1.005–1.030)
pH: 6 (ref 5.0–8.0)

## 2023-11-22 LAB — COMPREHENSIVE METABOLIC PANEL
ALT: 150 U/L — ABNORMAL HIGH (ref 0–44)
AST: 303 U/L — ABNORMAL HIGH (ref 15–41)
Albumin: 4.5 g/dL (ref 3.5–5.0)
Alkaline Phosphatase: 217 U/L — ABNORMAL HIGH (ref 38–126)
Anion gap: 10 (ref 5–15)
BUN: 12 mg/dL (ref 8–23)
CO2: 26 mmol/L (ref 22–32)
Calcium: 9.4 mg/dL (ref 8.9–10.3)
Chloride: 101 mmol/L (ref 98–111)
Creatinine, Ser: 1.08 mg/dL — ABNORMAL HIGH (ref 0.44–1.00)
GFR, Estimated: 53 mL/min — ABNORMAL LOW (ref >60)
Glucose, Bld: 90 mg/dL (ref 70–99)
Potassium: 3 mmol/L — ABNORMAL LOW (ref 3.5–5.1)
Sodium: 137 mmol/L (ref 135–145)
Total Bilirubin: 1.5 mg/dL — ABNORMAL HIGH (ref 0.0–1.2)
Total Protein: 7.1 g/dL (ref 6.5–8.1)

## 2023-11-22 LAB — LIPASE, BLOOD: Lipase: 43 U/L (ref 11–51)

## 2023-11-22 LAB — HEPATITIS PANEL, ACUTE
HCV Ab: NONREACTIVE
Hep A IgM: NONREACTIVE
Hep B C IgM: NONREACTIVE
Hepatitis B Surface Ag: NONREACTIVE

## 2023-11-22 LAB — CBC
HCT: 43.9 % (ref 36.0–46.0)
Hemoglobin: 14.8 g/dL (ref 12.0–15.0)
MCH: 30.7 pg (ref 26.0–34.0)
MCHC: 33.7 g/dL (ref 30.0–36.0)
MCV: 91.1 fL (ref 80.0–100.0)
Platelets: 193 10*3/uL (ref 150–400)
RBC: 4.82 MIL/uL (ref 3.87–5.11)
RDW: 14.1 % (ref 11.5–15.5)
WBC: 4.1 10*3/uL (ref 4.0–10.5)
nRBC: 0 % (ref 0.0–0.2)

## 2023-11-22 LAB — RESP PANEL BY RT-PCR (RSV, FLU A&B, COVID)  RVPGX2
Influenza A by PCR: NEGATIVE
Influenza B by PCR: NEGATIVE
Resp Syncytial Virus by PCR: NEGATIVE
SARS Coronavirus 2 by RT PCR: NEGATIVE

## 2023-11-22 LAB — LACTIC ACID, PLASMA: Lactic Acid, Venous: 1.5 mmol/L (ref 0.5–1.9)

## 2023-11-22 MED ORDER — POTASSIUM CHLORIDE CRYS ER 20 MEQ PO TBCR
40.0000 meq | EXTENDED_RELEASE_TABLET | Freq: Once | ORAL | Status: AC
  Administered 2023-11-22: 40 meq via ORAL
  Filled 2023-11-22: qty 2

## 2023-11-22 MED ORDER — ONDANSETRON 4 MG PO TBDP: 4.0000 mg | ORAL_TABLET | Freq: Four times a day (QID) | ORAL | 0 refills | Status: DC | PRN

## 2023-11-22 MED ORDER — AZITHROMYCIN 250 MG PO TABS: 250.0000 mg | ORAL_TABLET | Freq: Every day | ORAL | 0 refills | Status: DC

## 2023-11-22 MED ORDER — AMOXICILLIN-POT CLAVULANATE 875-125 MG PO TABS: 1.0000 | ORAL_TABLET | Freq: Two times a day (BID) | ORAL | 0 refills | Status: DC

## 2023-11-22 MED ORDER — SODIUM CHLORIDE 0.9 % IV SOLN: 1.0000 g | Freq: Once | INTRAVENOUS | Status: DC

## 2023-11-22 MED ORDER — IPRATROPIUM-ALBUTEROL 0.5-2.5 (3) MG/3ML IN SOLN
3.0000 mL | Freq: Once | RESPIRATORY_TRACT | Status: AC
  Administered 2023-11-22: 3 mL via RESPIRATORY_TRACT
  Filled 2023-11-22: qty 3

## 2023-11-22 MED ORDER — ALBUTEROL SULFATE HFA 108 (90 BASE) MCG/ACT IN AERS: 1.0000 | INHALATION_SPRAY | Freq: Four times a day (QID) | RESPIRATORY_TRACT | 0 refills | Status: DC | PRN

## 2023-11-22 MED ORDER — SODIUM CHLORIDE 0.9 % IV SOLN: 500.0000 mg | Freq: Once | INTRAVENOUS | Status: DC

## 2023-11-22 MED ORDER — SODIUM CHLORIDE 0.9 % IV BOLUS
1000.0000 mL | Freq: Once | INTRAVENOUS | Status: AC
  Administered 2023-11-22: 1000 mL via INTRAVENOUS

## 2023-11-22 MED ORDER — ONDANSETRON HCL 4 MG/2ML IJ SOLN
4.0000 mg | Freq: Once | INTRAMUSCULAR | Status: DC
  Filled 2023-11-22: qty 2

## 2023-11-22 NOTE — ED Provider Notes (Signed)
 [name]   CSN: [id number] Arrival date & time: 11/22/23  7829     History  Chief Complaint  Patient presents with   Flu sx   Nausea    [name] Nolan is a 78 y.o. female with past medical history significant for hyperlipidemia, hypothyroid, chronic migraine, GERD, peripheral artery disease, hypertension who presents with concern for cough, congestion, body aches, diarrhea, vomiting.  Patient reports no known sick contacts.  She is not sure whether she had a fever.  She reports some general abdominal discomfort at rest, and then worse when she is having diarrhea.  She denies any chest pain.  HPI     Home Medications Prior to Admission medications   Medication Sig Start Date End Date Taking? Authorizing Provider  albuterol (VENTOLIN HFA) 108 (90 Base) MCG/ACT inhaler Inhale 1-2 puffs into the lungs every 6 (six) hours as needed for wheezing or shortness of breath. 11/22/23  Yes Chauncey Bruno H, PA-C  amoxicillin-clavulanate (AUGMENTIN) 875-125 MG tablet Take 1 tablet by mouth every 12 (twelve) hours. 11/22/23  Yes Shirin Echeverry H, PA-C  azithromycin (ZITHROMAX) 250 MG tablet Take 1 tablet (250 mg total) by mouth daily. Take first 2 tablets together, then 1 every day until finished. 11/22/23  Yes Gianni Fuchs H, PA-C  ALPRAZolam (XANAX) 0.5 MG tablet Take 1 tablet (0.5 mg total) by mouth at bedtime as needed for anxiety. 01/12/23   Tollie Eth, NP  aspirin EC 81 MG tablet Take 1 tablet (81 mg total) by mouth daily. Swallow whole. Patient not taking: Reported on 08/04/2023 05/22/23   Early, Sung Amabile, NP  cyclobenzaprine (FLEXERIL) 5 MG tablet Take 1 tablet (5 mg total) by mouth 3 (three) times daily as needed for muscle spasms. 05/21/23   Tollie Eth, NP  fluconazole (DIFLUCAN) 150 MG tablet Take 1 tablet every 3 days for 3 doses for to help with rash. 07/14/23   Tollie Eth, NP  gabapentin (NEURONTIN) 300  MG capsule Take 4 capsules (1,200 mg total) by mouth at bedtime. 05/21/23   Tollie Eth, NP  levothyroxine (SYNTHROID) 50 MCG tablet Take 1 tablet (50 mcg total) by mouth daily. Except Saturday and Sunday, take 2 tabs 07/14/23   Early, Sung Amabile, NP  nystatin cream (MYCOSTATIN) APPLY TO AFFECTED AREA TWICE A DAY UNTIL RASH IS CLEAR 10/27/23   Early, Sung Amabile, NP  Semaglutide-Weight Management (WEGOVY) 0.25 MG/0.5ML SOAJ Inject 0.25 mg into the skin once a week. 07/14/23   Tollie Eth, NP  Semaglutide-Weight Management (WEGOVY) 0.5 MG/0.5ML SOAJ Inject 0.5 mg into the skin once a week. 09/01/23   Tollie Eth, NP  Semaglutide-Weight Management (WEGOVY) 1 MG/0.5ML SOAJ Inject 1 mg into the skin once a week. 07/14/23   Tollie Eth, NP  venlafaxine XR (EFFEXOR-XR) 75 MG 24 hr capsule Take 2 capsules (150 mg total) by mouth daily with breakfast. 05/21/23   Early, Sung Amabile, NP      Allergies    Statins and Codeine    Review of Systems   Review of Systems  All other systems reviewed and are negative.   Physical Exam Updated Vital Signs BP 132/70   Pulse 75   Temp 98.5 F (36.9 C) (Oral)   Resp 18   SpO2 98%  Physical Exam Vitals and nursing note reviewed.  Constitutional:      General: She is not in acute distress.    Appearance: Normal appearance.  HENT:     Head: Normocephalic and atraumatic.  Eyes:     General:        Right eye: No discharge.        Left eye: No discharge.  Cardiovascular:     Rate and Rhythm: Normal rate and regular rhythm.     Heart sounds: No murmur heard.    No friction rub. No gallop.  Pulmonary:     Effort: Pulmonary effort is normal.     Breath sounds: Normal breath sounds.     Comments: Mild expiratory wheeze in lower lung fields, some rhonchi in bilateral lung fields, question right greater than left.  No respiratory distress, no tachypnea, no stridor Abdominal:     General: Bowel sounds are normal.     Palpations: Abdomen is soft.  Skin:    General:  Skin is warm and dry.     Capillary Refill: Capillary refill takes less than 2 seconds.  Neurological:     Mental Status: She is alert and oriented to person, place, and time.  Psychiatric:        Mood and Affect: Mood normal.        Behavior: Behavior normal.     ED Results / Procedures / Treatments   Labs (all labs ordered are listed, but only abnormal results are displayed) Labs Reviewed  COMPREHENSIVE METABOLIC PANEL - Abnormal; Notable for the following components:      Result Value   Potassium 3.0 (*)    Creatinine, Ser 1.08 (*)    AST 303 (*)    ALT 150 (*)    Alkaline Phosphatase 217 (*)    Total Bilirubin 1.5 (*)    GFR, Estimated 53 (*)    All other components within normal limits  RESP PANEL BY RT-PCR (RSV, FLU A&B, COVID)  RVPGX2  CBC  LIPASE, BLOOD  LACTIC ACID, PLASMA  URINALYSIS, ROUTINE W REFLEX MICROSCOPIC  LACTIC ACID, PLASMA  HEPATITIS PANEL, ACUTE    EKG EKG Interpretation Date/Time:  Sunday November 22 2023 09:46:03 EDT Ventricular Rate:  73 PR Interval:  158 QRS Duration:  94 QT Interval:  522 QTC Calculation: 576 R Axis:   -22  Text Interpretation: Sinus rhythm Borderline left axis deviation Abnormal R-wave progression, early transition Nonspecific T abnormalities, diffuse leads Prolonged QT interval No significant change since last tracing Confirmed by Jacalyn Lefevre [phone]) on 11/22/2023 9:56:41 AM  Radiology US Abdomen Limited RUQ (LIVER/GB) Result Date: 11/22/2023 CLINICAL DATA:  [id number] RUQ abdominal pain [id number] EXAM: ULTRASOUND ABDOMEN LIMITED RIGHT UPPER QUADRANT COMPARISON:  November 03, 2021. FINDINGS: Evaluation is limited by problems with breath hold instructions, shadowing bowel gas and limited acoustic windows. Gallbladder: Surgically absent Common bile duct: Diameter: Visualized portion measures 10 mm, noted. This appears similar compared to prior CT and is likely due to post cholecystectomy reservoir effect. Mild central biliary ductal  prominence, similar in comparison to prior. Liver: No definitive focal lesion identified. Upper limits of normal and mildly heterogeneous in parenchymal echogenicity. Portal vein is patent on color Doppler imaging with normal direction of blood flow towards the liver. Other: None. IMPRESSION: No sonographic etiology for abdominal pain is identified. If persistent clinical concern, recommend dedicated cross-sectional imaging. Electronically Signed   By: Meda Klinefelter M.D.   On: 11/22/2023 11:26   DG Chest 2 View Result Date: 11/22/2023 CLINICAL DATA:  Shortness of breath EXAM: CHEST - 2 VIEW COMPARISON:  X-ray 01/06/2022. FINDINGS: No pneumothorax, effusion or consolidation. Normal cardiopericardial silhouette. Tortuous ectatic  aorta. Degenerative changes along the spine. Mild lung base opacities are identified bilaterally. Atelectasis versus infiltrate is possible. Recommend short follow-up. Degenerative changes of the spine. IMPRESSION: Patchy bandlike lung base opacities. Atelectasis versus infiltrate. Recommend follow-up. Electronically Signed   By: Karen Kays M.D.   On: 11/22/2023 09:44    Procedures Procedures    Medications Ordered in ED Medications  ondansetron (ZOFRAN) injection 4 mg (has no administration in time range)  potassium chloride SA (KLOR-CON M) CR tablet 40 mEq (has no administration in time range)  ipratropium-albuterol (DUONEB) 0.5-2.5 (3) MG/3ML nebulizer solution 3 mL (3 mLs Nebulization Given 11/22/23 1000)  sodium chloride 0.9 % bolus 1,000 mL (1,000 mLs Intravenous New Bag/Given 11/22/23 1610)    ED Course/ Medical Decision Making/ A&P Clinical Course as of 11/22/23 1302  Sun Nov 22, 2023  0920 Sx since last Thursday, congestion, now cough, now gagging and vomiting for last three days. Not able to eat or drink. Thinks she has been running a low temp. Crackles bilaterally, worse in lower right lung field. Diffusely tender abdomen throughout.  [CP]    Clinical Course  User Index [CP] Olene Floss, PA-C                                 Medical Decision Making Amount and/or Complexity of Data Reviewed Labs: ordered. Radiology: ordered.  Risk Prescription drug management.   This patient is a 78 y.o. female  who presents to the ED for concern of shob, cough, bodyaches, nvd.   Differential diagnoses prior to evaluation: The emergent differential diagnosis includes, but is not limited to, upper respiratory infection, pneumonia, atypical ACS, bronchitis, asthma / copd exacerbation, The causes of generalized abdominal pain include but are not limited to AAA, mesenteric ischemia, appendicitis, diverticulitis, DKA, gastritis, gastroenteritis, AMI, nephrolithiasis, pancreatitis, peritonitis, adrenal insufficiency,lead poisoning, iron toxicity, intestinal ischemia, constipation, UTI,SBO/LBO, splenic rupture, biliary disease, IBD, IBS, PUD, or hepatitis -- gastroenteritis, vs other . This is not an exhaustive differential.   Past Medical History / Co-morbidities / Social History: hyperlipidemia, hypothyroid, chronic migraine, GERD, peripheral artery disease, hypertension   Additional history: Chart reviewed. Pertinent results include: Reviewed outpatient neurology, feeling medicine visit  Physical Exam: Physical exam performed. The pertinent findings include: Vital signs stable in the emergency department, she has borderline low blood pressure, 98/60, oxygen has been on the borderline low side, around 92 to 95% since arrival.  She is not tachycardic.  Normal respiratory rate. Mild expiratory wheeze in lower lung fields, some rhonchi in bilateral lung fields, question right greater than left.  No respiratory distress, no tachypnea, no stridor  Lab Tests/Imaging studies: I personally interpreted labs/imaging and the pertinent results include: I independently interpreted plain film chest x-ray which shows some patchy infiltrate versus streaky atelectasis, in  context of her clinical presentation do think possible early atypical pneumonia is high on the differential, and we will plan to treat presumptively.  CBC with no leukocytosis or other abnormality, CMP notable for hypokalemia, potassium 3.0, mildly elevated creatinine at 1.08, her liver enzymes are significantly elevated, AST 303, ALT 150, alk phos 517 with total bilirubin of 1.5.  Normal lactic acid initially.  Right upper quadrant ultrasound with no acute pathology to explain her liver enzyme abnormalities.  Hepatitis panel pending.  I agree with the radiologist interpretation.  Cardiac monitoring: EKG obtained and interpreted by myself and attending physician which shows: Normal sinus rhythm, borderline  T wave abnormalities, but without significant change from baseline, prolonged QT, her potassium is low, we will orally replete.  Encourage close PCP recheck.   Medications: I ordered medication including DuoNeb, fluid bolus administered in the ED with significant improvement of her wheezing and work of breathing, she reports feeling significantly improved.  Potassium for hypokalemia, Zofran for nausea..  I have reviewed the patients home medicines and have made adjustments as needed.   Disposition: After consideration of the diagnostic results and the patients response to treatment, I feel that patient required 2 L nasal cannula for several hours during her visit, and ultimately especially with her elevated liver enzymes of unclear etiology we recommended hospital admission for acute hypoxic respiratory failure in setting of community-acquired pneumonia, patient declined, and we were able to successfully wean her off of oxygen without significant oxygen desaturation, discussed I ultimately think it is okay to try outpatient antibiotic therapy, but discussed extremely extensive return precautions and urged the importance of close PCP follow-up to further evaluate her liver enzymes.  Patient understands and  agrees to plan..   Workup does suggest an emergent condition requiring admission, pneumonia with hypoxia, but patient declined admission, since she was able to maintain oxygen saturation on room air prior to discharge I think okay to allow her to go home at this time on outpatient antibiotics as long as she follows up closely with PCP and understands importance of close return precautions. The plan is: As above. The patient is safe for discharge and has been instructed to return immediately for worsening symptoms, change in symptoms or any other concerns.  Final Clinical Impression(s) / ED Diagnoses Final diagnoses:  Pneumonia of both lungs due to infectious organism, unspecified part of lung  Hypokalemia  Elevated LFTs    Rx / DC Orders ED Discharge Orders          Ordered    amoxicillin-clavulanate (AUGMENTIN) 875-125 MG tablet  Every 12 hours        11/22/23 1259    azithromycin (ZITHROMAX) 250 MG tablet  Daily        11/22/23 1259    albuterol (VENTOLIN HFA) 108 (90 Base) MCG/ACT inhaler  Every 6 hours PRN        11/22/23 1259              Haelie Clapp, Tobias H, PA-C 11/22/23 1302    Jacalyn Lefevre, MD 11/22/23 1402

## 2023-11-22 NOTE — ED Triage Notes (Signed)
 She reports congested cough/aches and one episode of diarrhea, x 3 days. She is in no distress.

## 2023-11-22 NOTE — ED Notes (Signed)
 Pt coming from home, n/v for 3 days, diarrhea as well. Brought in by [facility], complains of weakness and dehydration, wanted ems to just give her iv fluids. vss

## 2023-11-22 NOTE — Discharge Instructions (Addendum)
 Please take the entire course of antibiotics that I prescribed, use the inhaler as needed for any ongoing shortness of breath.  Please follow-up this week with your primary care doctor to discuss your elevated liver enzymes, recheck your potassium and recheck your breathing.  Please return to the emergency department if you start feeling sicker, we ultimately discussed that we would want you to stay for admission but you declined at this time, so please return if you do not feel like you are improving at home.

## 2023-11-22 NOTE — ED Triage Notes (Signed)
 Pt coughing in triage, sounds congested, states she is sore from coughing.

## 2023-11-24 ENCOUNTER — Emergency Department (HOSPITAL_BASED_OUTPATIENT_CLINIC_OR_DEPARTMENT_OTHER)

## 2023-11-24 ENCOUNTER — Encounter (HOSPITAL_BASED_OUTPATIENT_CLINIC_OR_DEPARTMENT_OTHER): Payer: Self-pay | Admitting: Emergency Medicine

## 2023-11-24 ENCOUNTER — Other Ambulatory Visit: Payer: Self-pay

## 2023-11-24 ENCOUNTER — Emergency Department (HOSPITAL_COMMUNITY): Admission: EM | Admit: 2023-11-24 | Discharge: 2023-11-24 | Discharge status: Against Medical Advice

## 2023-11-24 ENCOUNTER — Inpatient Hospital Stay (HOSPITAL_BASED_OUTPATIENT_CLINIC_OR_DEPARTMENT_OTHER)
Admission: EM | Admit: 2023-11-24 | Discharge: 2023-11-27 | DRG: 871 | Disposition: A | Discharge status: Home / Self Care | Attending: Internal Medicine | Admitting: Internal Medicine

## 2023-11-24 DIAGNOSIS — Z8051 Family history of malignant neoplasm of kidney: Secondary | ICD-10-CM

## 2023-11-24 DIAGNOSIS — R1084 Generalized abdominal pain: Secondary | ICD-10-CM | POA: Diagnosis not present

## 2023-11-24 DIAGNOSIS — J189 Pneumonia, unspecified organism: Principal | ICD-10-CM | POA: Diagnosis present

## 2023-11-24 DIAGNOSIS — Z860100 Personal history of colon polyps, unspecified: Secondary | ICD-10-CM

## 2023-11-24 DIAGNOSIS — F418 Other specified anxiety disorders: Secondary | ICD-10-CM | POA: Diagnosis present

## 2023-11-24 DIAGNOSIS — Z83438 Family history of other disorder of lipoprotein metabolism and other lipidemia: Secondary | ICD-10-CM

## 2023-11-24 DIAGNOSIS — R062 Wheezing: Secondary | ICD-10-CM | POA: Diagnosis not present

## 2023-11-24 DIAGNOSIS — Z7982 Long term (current) use of aspirin: Secondary | ICD-10-CM

## 2023-11-24 DIAGNOSIS — D709 Neutropenia, unspecified: Secondary | ICD-10-CM | POA: Diagnosis present

## 2023-11-24 DIAGNOSIS — R110 Nausea: Secondary | ICD-10-CM | POA: Diagnosis not present

## 2023-11-24 DIAGNOSIS — E872 Acidosis, unspecified: Secondary | ICD-10-CM | POA: Diagnosis present

## 2023-11-24 DIAGNOSIS — E66811 Obesity, class 1: Secondary | ICD-10-CM | POA: Diagnosis present

## 2023-11-24 DIAGNOSIS — Z885 Allergy status to narcotic agent status: Secondary | ICD-10-CM

## 2023-11-24 DIAGNOSIS — Z806 Family history of leukemia: Secondary | ICD-10-CM

## 2023-11-24 DIAGNOSIS — Z8249 Family history of ischemic heart disease and other diseases of the circulatory system: Secondary | ICD-10-CM

## 2023-11-24 DIAGNOSIS — Z841 Family history of disorders of kidney and ureter: Secondary | ICD-10-CM

## 2023-11-24 DIAGNOSIS — Z1152 Encounter for screening for COVID-19: Secondary | ICD-10-CM

## 2023-11-24 DIAGNOSIS — F419 Anxiety disorder, unspecified: Secondary | ICD-10-CM | POA: Diagnosis present

## 2023-11-24 DIAGNOSIS — Z79899 Other long term (current) drug therapy: Secondary | ICD-10-CM

## 2023-11-24 DIAGNOSIS — Z6831 Body mass index (BMI) 31.0-31.9, adult: Secondary | ICD-10-CM

## 2023-11-24 DIAGNOSIS — J40 Bronchitis, not specified as acute or chronic: Principal | ICD-10-CM | POA: Diagnosis present

## 2023-11-24 DIAGNOSIS — R112 Nausea with vomiting, unspecified: Secondary | ICD-10-CM

## 2023-11-24 DIAGNOSIS — M8580 Other specified disorders of bone density and structure, unspecified site: Secondary | ICD-10-CM | POA: Diagnosis present

## 2023-11-24 DIAGNOSIS — R0682 Tachypnea, not elsewhere classified: Secondary | ICD-10-CM | POA: Diagnosis present

## 2023-11-24 DIAGNOSIS — I10 Essential (primary) hypertension: Secondary | ICD-10-CM | POA: Diagnosis not present

## 2023-11-24 DIAGNOSIS — Z8261 Family history of arthritis: Secondary | ICD-10-CM

## 2023-11-24 DIAGNOSIS — R6520 Severe sepsis without septic shock: Secondary | ICD-10-CM | POA: Diagnosis present

## 2023-11-24 DIAGNOSIS — A419 Sepsis, unspecified organism: Principal | ICD-10-CM | POA: Diagnosis present

## 2023-11-24 DIAGNOSIS — R197 Diarrhea, unspecified: Secondary | ICD-10-CM | POA: Diagnosis present

## 2023-11-24 DIAGNOSIS — G43909 Migraine, unspecified, not intractable, without status migrainosus: Secondary | ICD-10-CM | POA: Diagnosis present

## 2023-11-24 DIAGNOSIS — E063 Autoimmune thyroiditis: Secondary | ICD-10-CM | POA: Diagnosis present

## 2023-11-24 DIAGNOSIS — Z888 Allergy status to other drugs, medicaments and biological substances status: Secondary | ICD-10-CM

## 2023-11-24 DIAGNOSIS — Z7985 Long-term (current) use of injectable non-insulin antidiabetic drugs: Secondary | ICD-10-CM

## 2023-11-24 DIAGNOSIS — R918 Other nonspecific abnormal finding of lung field: Secondary | ICD-10-CM | POA: Diagnosis not present

## 2023-11-24 DIAGNOSIS — G43719 Chronic migraine without aura, intractable, without status migrainosus: Secondary | ICD-10-CM | POA: Diagnosis present

## 2023-11-24 DIAGNOSIS — Z79890 Hormone replacement therapy (postmenopausal): Secondary | ICD-10-CM

## 2023-11-24 DIAGNOSIS — Z85828 Personal history of other malignant neoplasm of skin: Secondary | ICD-10-CM

## 2023-11-24 DIAGNOSIS — F32A Depression, unspecified: Secondary | ICD-10-CM | POA: Diagnosis present

## 2023-11-24 DIAGNOSIS — E782 Mixed hyperlipidemia: Secondary | ICD-10-CM | POA: Diagnosis present

## 2023-11-24 DIAGNOSIS — K219 Gastro-esophageal reflux disease without esophagitis: Secondary | ICD-10-CM | POA: Diagnosis present

## 2023-11-24 DIAGNOSIS — Z66 Do not resuscitate: Secondary | ICD-10-CM | POA: Diagnosis present

## 2023-11-24 DIAGNOSIS — E876 Hypokalemia: Secondary | ICD-10-CM | POA: Diagnosis present

## 2023-11-24 DIAGNOSIS — E860 Dehydration: Secondary | ICD-10-CM | POA: Diagnosis present

## 2023-11-24 DIAGNOSIS — I739 Peripheral vascular disease, unspecified: Secondary | ICD-10-CM | POA: Diagnosis present

## 2023-11-24 HISTORY — DX: Pneumonia, unspecified organism: J18.9

## 2023-11-24 LAB — BASIC METABOLIC PANEL
Anion gap: 16 — ABNORMAL HIGH (ref 5–15)
BUN: 8 mg/dL (ref 8–23)
CO2: 21 mmol/L — ABNORMAL LOW (ref 22–32)
Calcium: 10 mg/dL (ref 8.9–10.3)
Chloride: 101 mmol/L (ref 98–111)
Creatinine, Ser: 1.09 mg/dL — ABNORMAL HIGH (ref 0.44–1.00)
GFR, Estimated: 53 mL/min — ABNORMAL LOW (ref >60)
Glucose, Bld: 137 mg/dL — ABNORMAL HIGH (ref 70–99)
Potassium: 2.8 mmol/L — ABNORMAL LOW (ref 3.5–5.1)
Sodium: 138 mmol/L (ref 135–145)

## 2023-11-24 LAB — HEPATIC FUNCTION PANEL
ALT: 82 U/L — ABNORMAL HIGH (ref 0–44)
AST: 79 U/L — ABNORMAL HIGH (ref 15–41)
Albumin: 4.5 g/dL (ref 3.5–5.0)
Alkaline Phosphatase: 178 U/L — ABNORMAL HIGH (ref 38–126)
Bilirubin, Direct: 0.2 mg/dL (ref 0.0–0.2)
Indirect Bilirubin: 0.8 mg/dL (ref 0.3–0.9)
Total Bilirubin: 1 mg/dL (ref 0.0–1.2)
Total Protein: 7.3 g/dL (ref 6.5–8.1)

## 2023-11-24 LAB — CBC WITH DIFFERENTIAL/PLATELET
Abs Immature Granulocytes: 0.01 10*3/uL (ref 0.00–0.07)
Basophils Absolute: 0 10*3/uL (ref 0.0–0.1)
Basophils Relative: 0 %
Eosinophils Absolute: 0.1 10*3/uL (ref 0.0–0.5)
Eosinophils Relative: 2 %
HCT: 44 % (ref 36.0–46.0)
Hemoglobin: 15.2 g/dL — ABNORMAL HIGH (ref 12.0–15.0)
Immature Granulocytes: 0 %
Lymphocytes Relative: 29 %
Lymphs Abs: 0.9 10*3/uL (ref 0.7–4.0)
MCH: 31.5 pg (ref 26.0–34.0)
MCHC: 34.5 g/dL (ref 30.0–36.0)
MCV: 91.1 fL (ref 80.0–100.0)
Monocytes Absolute: 0.1 10*3/uL (ref 0.1–1.0)
Monocytes Relative: 4 %
Neutro Abs: 2 10*3/uL (ref 1.7–7.7)
Neutrophils Relative %: 65 %
Platelets: 204 10*3/uL (ref 150–400)
RBC: 4.83 MIL/uL (ref 3.87–5.11)
RDW: 14.2 % (ref 11.5–15.5)
WBC: 3 10*3/uL — ABNORMAL LOW (ref 4.0–10.5)
nRBC: 0 % (ref 0.0–0.2)

## 2023-11-24 LAB — TROPONIN I (HIGH SENSITIVITY)
Troponin I (High Sensitivity): 3 ng/L (ref <18)
Troponin I (High Sensitivity): 3 ng/L (ref <18)

## 2023-11-24 LAB — LACTIC ACID, PLASMA
Lactic Acid, Venous: 5.1 mmol/L (ref 0.5–1.9)
Lactic Acid, Venous: 4.7 mmol/L (ref 0.5–1.9)

## 2023-11-24 LAB — RESP PANEL BY RT-PCR (RSV, FLU A&B, COVID)  RVPGX2
Influenza A by PCR: NEGATIVE
Influenza B by PCR: NEGATIVE
Resp Syncytial Virus by PCR: NEGATIVE
SARS Coronavirus 2 by RT PCR: NEGATIVE

## 2023-11-24 LAB — BRAIN NATRIURETIC PEPTIDE: B Natriuretic Peptide: 35.7 pg/mL (ref 0.0–100.0)

## 2023-11-24 LAB — LIPASE, BLOOD: Lipase: 20 U/L (ref 11–51)

## 2023-11-24 MED ORDER — LACTATED RINGERS IV SOLN: INTRAVENOUS | Status: DC

## 2023-11-24 MED ORDER — ENOXAPARIN SODIUM 40 MG/0.4ML IJ SOSY
40.0000 mg | PREFILLED_SYRINGE | INTRAMUSCULAR | Status: DC
  Administered 2023-11-25 – 2023-11-27 (×3): 40 mg via SUBCUTANEOUS
  Filled 2023-11-24 (×3): qty 0.4

## 2023-11-24 MED ORDER — SODIUM CHLORIDE 0.9 % IV SOLN
500.0000 mg | Freq: Every day | INTRAVENOUS | Status: DC
  Administered 2023-11-24 – 2023-11-26 (×3): 500 mg via INTRAVENOUS
  Filled 2023-11-24 (×3): qty 5

## 2023-11-24 MED ORDER — LACTATED RINGERS IV BOLUS
1000.0000 mL | Freq: Once | INTRAVENOUS | Status: AC
  Administered 2023-11-24: 1000 mL via INTRAVENOUS

## 2023-11-24 MED ORDER — METOCLOPRAMIDE HCL 5 MG/ML IJ SOLN
10.0000 mg | Freq: Once | INTRAMUSCULAR | Status: AC
  Administered 2023-11-24: 10 mg via INTRAVENOUS
  Filled 2023-11-24: qty 2

## 2023-11-24 MED ORDER — ALBUTEROL SULFATE (2.5 MG/3ML) 0.083% IN NEBU
5.0000 mg | INHALATION_SOLUTION | Freq: Once | RESPIRATORY_TRACT | Status: AC
  Administered 2023-11-24: 5 mg via RESPIRATORY_TRACT
  Filled 2023-11-24: qty 6

## 2023-11-24 MED ORDER — VENLAFAXINE HCL ER 150 MG PO CP24
150.0000 mg | ORAL_CAPSULE | Freq: Every day | ORAL | Status: DC
  Administered 2023-11-26 – 2023-11-27 (×2): 150 mg via ORAL
  Filled 2023-11-24 (×2): qty 1

## 2023-11-24 MED ORDER — IPRATROPIUM-ALBUTEROL 0.5-2.5 (3) MG/3ML IN SOLN
3.0000 mL | RESPIRATORY_TRACT | Status: DC
  Administered 2023-11-24: 3 mL via RESPIRATORY_TRACT
  Filled 2023-11-24: qty 3

## 2023-11-24 MED ORDER — ALBUTEROL SULFATE (2.5 MG/3ML) 0.083% IN NEBU: 2.5000 mg | INHALATION_SOLUTION | RESPIRATORY_TRACT | Status: DC | PRN

## 2023-11-24 MED ORDER — POTASSIUM CHLORIDE 10 MEQ/100ML IV SOLN
10.0000 meq | INTRAVENOUS | Status: AC
  Administered 2023-11-24 (×3): 10 meq via INTRAVENOUS
  Filled 2023-11-24 (×2): qty 100

## 2023-11-24 MED ORDER — ONDANSETRON HCL 4 MG/2ML IJ SOLN
4.0000 mg | Freq: Four times a day (QID) | INTRAMUSCULAR | Status: DC | PRN
  Administered 2023-11-24 – 2023-11-26 (×5): 4 mg via INTRAVENOUS
  Filled 2023-11-24 (×5): qty 2

## 2023-11-24 MED ORDER — SODIUM CHLORIDE 0.9 % IV SOLN: INTRAVENOUS | Status: AC | PRN

## 2023-11-24 MED ORDER — MORPHINE SULFATE (PF) 2 MG/ML IV SOLN
2.0000 mg | INTRAVENOUS | Status: DC | PRN
  Administered 2023-11-24 – 2023-11-25 (×2): 2 mg via INTRAVENOUS
  Filled 2023-11-24 (×2): qty 1

## 2023-11-24 MED ORDER — LEVOTHYROXINE SODIUM 50 MCG PO TABS
50.0000 ug | ORAL_TABLET | Freq: Every day | ORAL | Status: DC
  Administered 2023-11-25 – 2023-11-27 (×3): 50 ug via ORAL
  Filled 2023-11-24 (×3): qty 1

## 2023-11-24 MED ORDER — IPRATROPIUM-ALBUTEROL 0.5-2.5 (3) MG/3ML IN SOLN
3.0000 mL | Freq: Four times a day (QID) | RESPIRATORY_TRACT | Status: DC
  Administered 2023-11-25: 3 mL via RESPIRATORY_TRACT
  Filled 2023-11-24: qty 3

## 2023-11-24 MED ORDER — SODIUM CHLORIDE 0.9 % IV SOLN
2.0000 g | INTRAVENOUS | Status: DC
  Administered 2023-11-25 – 2023-11-27 (×3): 2 g via INTRAVENOUS
  Filled 2023-11-24 (×3): qty 20

## 2023-11-24 MED ORDER — HYDROCOD POLI-CHLORPHE POLI ER 10-8 MG/5ML PO SUER
5.0000 mL | Freq: Two times a day (BID) | ORAL | Status: DC
  Administered 2023-11-24 – 2023-11-27 (×3): 5 mL via ORAL
  Filled 2023-11-24 (×4): qty 5

## 2023-11-24 MED ORDER — IPRATROPIUM BROMIDE 0.02 % IN SOLN
0.5000 mg | Freq: Once | RESPIRATORY_TRACT | Status: AC
  Administered 2023-11-24: 0.5 mg via RESPIRATORY_TRACT
  Filled 2023-11-24: qty 2.5

## 2023-11-24 MED ORDER — OXYCODONE HCL 5 MG PO TABS
5.0000 mg | ORAL_TABLET | ORAL | Status: DC | PRN
  Administered 2023-11-24 – 2023-11-25 (×2): 5 mg via ORAL
  Filled 2023-11-24 (×5): qty 1

## 2023-11-24 MED ORDER — SODIUM CHLORIDE 0.9 % IV SOLN: INTRAVENOUS | Status: DC

## 2023-11-24 MED ORDER — SODIUM CHLORIDE 0.9 % IV SOLN
1.0000 g | Freq: Once | INTRAVENOUS | Status: AC
  Administered 2023-11-24: 1 g via INTRAVENOUS
  Filled 2023-11-24: qty 10

## 2023-11-24 NOTE — Plan of Care (Signed)
 Plan of Care Note for accepted transfer   Patient: [name] Nolan MRN: [id number]   DOA: 11/24/2023  Facility requesting transfer: [facility] Requesting Provider: Dr. Anitra Lauth Reason for transfer: [facility] Facility course:  [name] is a 78 yo female with PMH migraines, anxiety/depression, HLD, hypothyroidism who presented to [facility] with worsening symptoms of diarrhea, weakness, N/V, coughing, and generalized aches. She was recently seen at [facility] on 3/9 for similar sxms and did not wish for admission at that time.  CXR showed patchy bandlike basilar opacities.  She was discharged with Augmentin and azithromycin along with albuterol inhaler. She has continued to have difficulty maintaining adequate oral nutrition at home with ongoing vomiting and diarrhea and overall worsening symptoms, therefore presented back to [facility].  She was noted to be afebrile with stable vitals but had mild tachypnea and increased work of breathing and was placed on oxygen. Lab workup also remarkable for hypokalemia, 2.8 WBC further down trended to 3 from prior level of 4.1 on 11/22/2023. Lactic returned elevated at 5.1 but is planned to redraw in case of possible lab error.  She had already taken azithromycin at home and was therefore given a dose of Rocephin.  She is admitted for ongoing IV antibiotics and further workup and rehydration.   Plan of care: The patient is accepted for admission to Med-surg  unit, at [facility].  Author: Lewie Chamber, MD 11/24/2023  Check [email or web address] for on-call coverage.  Nursing staff, Please call [facility] Admits & Consults System-Wide number on Amion as soon as patient's arrival, so appropriate admitting provider can evaluate the pt.

## 2023-11-24 NOTE — H&P (Signed)
 History and Physical    Patient: [name] Nolan ZOX:[id number] DOB: 22-Nov-1945 DOA: 11/24/2023 DOS: the patient was seen and examined on 11/24/2023 PCP: Tollie Eth, NP  Patient coming from: Home  Chief Complaint:  Chief Complaint  Patient presents with   Pneumonia   HPI: [name] Nolan is a 78 y.o. female with medical history significant of Migraine headaches, hyperlipidemia, hypothyroidism, anxiety with depression, GERD, history of Hashimoto's disease who presented to the [facility] with complaints of nausea vomiting and diarrhea, weakness and persistent cough.  Patient also reported generalized body aches.  Patient was seen with similar symptoms on March 9.  At that time she was seen with some patchy infiltrates bibasilar.  Recommendation was for admission for pneumonia but patient declined.  She went home and now came back with similar symptoms.  Infiltrates appear to have worsened slightly.  Patient has been on Augmentin and azithromycin.  She is now being admitted for management of multifocal pneumonia that failed outpatient treatment.  She is also noted to have hypokalemia she also has a lactic acidosis.  Patient has received Rocephin in the ER.  She is therefore being admitted to the hospital for further management.  Review of Systems: As mentioned in the history of present illness. All other systems reviewed and are negative. Past Medical History:  Diagnosis Date   Absolute anemia 11/17/2017   Acute hyperglycemia 11/04/2021   Acute non-recurrent pansinusitis 09/26/2021   Anxiety and depression 05/07/2014   Widowed in 2013 after caring for her husband with Lewy Body Dementia for 6 years    Bacterial URI 09/17/2022   Basal cell carcinoma of right ear 02/26/2015   Removed by Dr Hortense Ramal   Chronic back pain    "mid-back; stops at the very lowest part of my back" (11/07/2015)   Dysphagia, pharyngoesophageal phase 12/13/2014   Dysuria 02/14/2022    Encounter to establish care 02/28/2021   Esophageal reflux    occ   Excessive daytime sleepiness 01/25/2015   Hashimoto's disease    Headache 01/06/2022   Heart murmur    History of colon polyps 09/03/2017   Hospital discharge follow-up 02/14/2022   Hot flashes 05/27/2016   Hyperlipidemia    Hypokalemia 11/04/2021   Hypothyroid    Insomnia    Joint pain    Low ferritin 08/27/2015   "took supplements for awhile" (11/07/2015)   Migraine    "under control w/daily RX right now" (11/07/2015)   Occipital neuralgia    Occipital neuralgia of right side 01/08/2022   Osteopenia 02/26/2015   Parainfluenza infection 11/04/2021   Pneumonia    PONV (postoperative nausea and vomiting) 1974   after cholecystectomy   Preventative health care 08/27/2015   Prolonged QT interval 11/04/2021   Skin cancer 02/26/2015   Right ear Removed by Dr Hortense Ramal   Subacute cough 11/18/2021   Vitamin B12 deficiency 09/01/2016   Vitamin D deficiency 03/02/2017   Past Surgical History:  Procedure Laterality Date   APPENDECTOMY  11/07/2015   BASAL CELL CARCINOMA EXCISION Right 02/26/2015   ear   CATARACT EXTRACTION Bilateral    CHOLECYSTECTOMY OPEN  09/15/1972   COLONOSCOPY  09/15/2004   LAPAROSCOPIC APPENDECTOMY N/A 11/07/2015   Procedure: APPENDECTOMY LAPAROSCOPIC;  Surgeon: Violeta Gelinas, MD;  Location: [facility] OR;  Service: General;  Laterality: N/A;   LAPAROSCOPIC INCISIONAL / UMBILICAL / VENTRAL HERNIA REPAIR  11/07/2015   UHR   LUMBAR LAMINECTOMY/DECOMPRESSION MICRODISCECTOMY N/A 03/21/2021   Procedure: Microlumbar decompression Lumbar four-five Central;  Surgeon: Shelle Iron,  Tinnie Gens, MD;  Location: [facility] OR;  Service: Orthopedics;  Laterality: N/A;   Tooth implant     at least 5 years ago per pt   TUBAL LIGATION  09/16/1971   UMBILICAL HERNIA REPAIR N/A 11/07/2015   Procedure: LAPAROSCOPIC UMBILICAL HERNIA;  Surgeon: Violeta Gelinas, MD;  Location: [facility] OR;  Service: General;  Laterality: N/A;   Social History:   reports that she has never smoked. She has never used smokeless tobacco. She reports that she does not drink alcohol and does not use drugs.  Allergies  Allergen Reactions   Statins Other (See Comments)    Muscle pain   Codeine Nausea And Vomiting    Family History  Problem Relation Age of Onset   Congestive Heart Failure Mother    Hypertension Mother    Hyperlipidemia Mother    Heart disease Mother    Obesity Mother    Leukemia Father    Obesity Father    Kidney disease Brother    Cancer Brother        stage 4 kidney cancer, metastatic   Kidney cancer Brother    Leukemia Maternal Aunt    Congestive Heart Failure Maternal Grandmother    Arthritis Sister    Colon cancer Neg Hx     Prior to Admission medications   Medication Sig Start Date End Date Taking? Authorizing Provider  albuterol (VENTOLIN HFA) 108 (90 Base) MCG/ACT inhaler Inhale 1-2 puffs into the lungs every 6 (six) hours as needed for wheezing or shortness of breath. 11/22/23   Prosperi, Christian H, PA-C  ALPRAZolam (XANAX) 0.5 MG tablet Take 1 tablet (0.5 mg total) by mouth at bedtime as needed for anxiety. 01/12/23   Tollie Eth, NP  amoxicillin-clavulanate (AUGMENTIN) 875-125 MG tablet Take 1 tablet by mouth every 12 (twelve) hours. 11/22/23   Prosperi, Christian H, PA-C  aspirin EC 81 MG tablet Take 1 tablet (81 mg total) by mouth daily. Swallow whole. Patient not taking: Reported on 08/04/2023 05/22/23   Early, Sung Amabile, NP  azithromycin (ZITHROMAX) 250 MG tablet Take 1 tablet (250 mg total) by mouth daily. Take first 2 tablets together, then 1 every day until finished. 11/22/23   Prosperi, Christian H, PA-C  cyclobenzaprine (FLEXERIL) 5 MG tablet Take 1 tablet (5 mg total) by mouth 3 (three) times daily as needed for muscle spasms. 05/21/23   Tollie Eth, NP  fluconazole (DIFLUCAN) 150 MG tablet Take 1 tablet every 3 days for 3 doses for to help with rash. 07/14/23   Tollie Eth, NP  gabapentin (NEURONTIN) 300 MG  capsule Take 4 capsules (1,200 mg total) by mouth at bedtime. 05/21/23   Tollie Eth, NP  levothyroxine (SYNTHROID) 50 MCG tablet Take 1 tablet (50 mcg total) by mouth daily. Except Saturday and Sunday, take 2 tabs 07/14/23   Early, Sung Amabile, NP  nystatin cream (MYCOSTATIN) APPLY TO AFFECTED AREA TWICE A DAY UNTIL RASH IS CLEAR 10/27/23   Early, Sung Amabile, NP  ondansetron (ZOFRAN-ODT) 4 MG disintegrating tablet Take 1 tablet (4 mg total) by mouth every 6 (six) hours as needed for nausea or vomiting. 11/22/23   Prosperi, Christian H, PA-C  Semaglutide-Weight Management (WEGOVY) 0.25 MG/0.5ML SOAJ Inject 0.25 mg into the skin once a week. 07/14/23   Tollie Eth, NP  Semaglutide-Weight Management (WEGOVY) 0.5 MG/0.5ML SOAJ Inject 0.5 mg into the skin once a week. 09/01/23   Tollie Eth, NP  Semaglutide-Weight Management (WEGOVY) 1 MG/0.5ML SOAJ Inject 1  mg into the skin once a week. 07/14/23   Tollie Eth, NP  venlafaxine XR (EFFEXOR-XR) 75 MG 24 hr capsule Take 2 capsules (150 mg total) by mouth daily with breakfast. 05/21/23   Early, Sung Amabile, NP    Physical Exam: Vitals:   11/24/23 1830 11/24/23 1857 11/24/23 2006 11/24/23 2044  BP: (!) 153/102  (!) 144/81   Pulse: 85  77   Resp: 18  20   Temp:  98.3 F (36.8 C) 98.2 F (36.8 C)   TempSrc:  Oral Oral   SpO2: 98%  99% 99%   Constitutional: Acutely ill looking, NAD, calm, comfortable Eyes: PERRL, lids and conjunctivae normal ENMT: Mucous membranes are dry. Posterior pharynx clear of any exudate or lesions.Normal dentition.  Neck: normal, supple, no masses, no thyromegaly Respiratory: Coarse breath sounds, bilaterally, no wheezing, no crackles. Normal respiratory effort. No accessory muscle use.  Cardiovascular: Regular rate and rhythm, no murmurs / rubs / gallops. No extremity edema. 2+ pedal pulses. No carotid bruits.  Abdomen: no tenderness, no masses palpated. No hepatosplenomegaly. Bowel sounds positive.  Musculoskeletal: Good range of  motion, no joint swelling or tenderness, Skin: no rashes, lesions, ulcers. No induration Neurologic: CN 2-12 grossly intact. Sensation intact, DTR normal. Strength 5/5 in all 4.  Psychiatric: Normal judgment and insight. Alert and oriented x 3. Normal mood  Data Reviewed:  Temperature 98.2, Blood pressure 153/102, respiratory rate of 24, oxygen sat 91% on room air.  White count 3.0 hemoglobin 15.2 potassium 2.8 creatinine 1.09 CO2 21, Glucose 137.  Alkaline phos was 78 lactic acid 5.1 acute viral screen is negative chest x-ray showed improved aeration with residual perihilar interstitial prominence  Assessment and Plan:  #1 community-acquired pneumonia: Patient has technically speaking failed outpatient treatment.  Will admit.  Initiate Rocephin with Zithromax.  Blood cultures to be obtained.  Control her cough.  Other supportive care  #2 GERD: Continue with PPIs.  #3 peripheral arterial disease: Resume home regimen  #4 hypothyroidism: Continue with levothyroxine  #5 depression with anxiety: Continue home regimen  #6 hypokalemia: Probably poor oral intake.  Continue to replete potassium  #7 mixed hyperlipidemia: Continue with statin    Advance Care Planning:   Code Status: Prior DNR  Consults: None  Family Communication: No family at bedside  Severity of Illness: The appropriate patient status for this patient is INPATIENT. Inpatient status is judged to be reasonable and necessary in order to provide the required intensity of service to ensure the patient's safety. The patient's presenting symptoms, physical exam findings, and initial radiographic and laboratory data in the context of their chronic comorbidities is felt to place them at high risk for further clinical deterioration. Furthermore, it is not anticipated that the patient will be medically stable for discharge from the hospital within 2 midnights of admission.   * I certify that at the point of admission it is my  clinical judgment that the patient will require inpatient hospital care spanning beyond 2 midnights from the point of admission due to high intensity of service, high risk for further deterioration and high frequency of surveillance required.*  AuthorLonia Blood, MD 11/24/2023 9:49 PM  For on call review [email or web address].

## 2023-11-24 NOTE — ED Triage Notes (Signed)
 Was seen on 11/22/23 PNA with hypoxia Feels like she is getting worse  Was at [place] via ems but left because she didn't want to wait in waiting room  Lungs bilateral crackles

## 2023-11-24 NOTE — ED Notes (Signed)
 Verified with EDP [facility] that bed assignment is appropriate.

## 2023-11-24 NOTE — ED Notes (Signed)
 Pt told ems she was not going to wait in the waiting room and left. Said she was going to [facility]

## 2023-11-24 NOTE — ED Provider Notes (Signed)
 [place] EMERGENCY DEPARTMENT AT [facility] Provider Note   CSN: [id number] Arrival date & time: 11/24/23  1352     History  Chief Complaint  Patient presents with   Pneumonia    [name] Nolan is a 78 y.o. female.  Pt is a 78y/o female with hx of hyperlipidemia, hypothyroid, chronic migraine, GERD, peripheral artery disease, hypertension who was seen 2 days ago and at that time was diagnosed with pneumonia, hypokalemia and dehydration.  At that time patient did not wish to stay in the hospital and wanted to go home.  She reports unfortunately since she has been home she has only progressively gotten worse.  She has been trying to take the Augmentin and azithromycin but has had worsening diarrhea and vomiting.  Also is having worsening cough and shortness of breath.  She has been using the inhaler at home with little improvement.  She has not been able to eat and drink anything because of the nausea and vomiting and reports she just feels terrible.  She has pain in her back and chest from all the coughing and occasionally will cough up some thick sputum.  She reports just having myalgias and fatigue.  She lives alone and has been staying in her room because she reports it is closest to the bathroom.  She has not had any falls and family does not mention any confusion or altered mental status.  The history is provided by the patient and medical records.  Pneumonia       Home Medications Prior to Admission medications   Medication Sig Start Date End Date Taking? Authorizing Provider  albuterol (VENTOLIN HFA) 108 (90 Base) MCG/ACT inhaler Inhale 1-2 puffs into the lungs every 6 (six) hours as needed for wheezing or shortness of breath. 11/22/23   Prosperi, Christian H, PA-C  ALPRAZolam (XANAX) 0.5 MG tablet Take 1 tablet (0.5 mg total) by mouth at bedtime as needed for anxiety. 01/12/23   Tollie Eth, NP  amoxicillin-clavulanate (AUGMENTIN) 875-125 MG tablet Take 1  tablet by mouth every 12 (twelve) hours. 11/22/23   Prosperi, Christian H, PA-C  aspirin EC 81 MG tablet Take 1 tablet (81 mg total) by mouth daily. Swallow whole. Patient not taking: Reported on 08/04/2023 05/22/23   Early, Sung Amabile, NP  azithromycin (ZITHROMAX) 250 MG tablet Take 1 tablet (250 mg total) by mouth daily. Take first 2 tablets together, then 1 every day until finished. 11/22/23   Prosperi, Christian H, PA-C  cyclobenzaprine (FLEXERIL) 5 MG tablet Take 1 tablet (5 mg total) by mouth 3 (three) times daily as needed for muscle spasms. 05/21/23   Tollie Eth, NP  fluconazole (DIFLUCAN) 150 MG tablet Take 1 tablet every 3 days for 3 doses for to help with rash. 07/14/23   Tollie Eth, NP  gabapentin (NEURONTIN) 300 MG capsule Take 4 capsules (1,200 mg total) by mouth at bedtime. 05/21/23   Tollie Eth, NP  levothyroxine (SYNTHROID) 50 MCG tablet Take 1 tablet (50 mcg total) by mouth daily. Except Saturday and Sunday, take 2 tabs 07/14/23   Early, Sung Amabile, NP  nystatin cream (MYCOSTATIN) APPLY TO AFFECTED AREA TWICE A DAY UNTIL RASH IS CLEAR 10/27/23   Early, Sung Amabile, NP  ondansetron (ZOFRAN-ODT) 4 MG disintegrating tablet Take 1 tablet (4 mg total) by mouth every 6 (six) hours as needed for nausea or vomiting. 11/22/23   Prosperi, Christian H, PA-C  Semaglutide-Weight Management (WEGOVY) 0.25 MG/0.5ML SOAJ Inject 0.25 mg into  the skin once a week. 07/14/23   Tollie Eth, NP  Semaglutide-Weight Management (WEGOVY) 0.5 MG/0.5ML SOAJ Inject 0.5 mg into the skin once a week. 09/01/23   Tollie Eth, NP  Semaglutide-Weight Management (WEGOVY) 1 MG/0.5ML SOAJ Inject 1 mg into the skin once a week. 07/14/23   Tollie Eth, NP  venlafaxine XR (EFFEXOR-XR) 75 MG 24 hr capsule Take 2 capsules (150 mg total) by mouth daily with breakfast. 05/21/23   Early, Sung Amabile, NP      Allergies    Statins and Codeine    Review of Systems   Review of Systems  Physical Exam Updated Vital Signs BP 138/68   Pulse 87    Temp 98.3 F (36.8 C)   Resp (!) 21   SpO2 98%  Physical Exam Vitals and nursing note reviewed.  Constitutional:      General: She is not in acute distress.    Appearance: She is well-developed. She is ill-appearing.  HENT:     Head: Normocephalic and atraumatic.     Mouth/Throat:     Mouth: Mucous membranes are dry.  Eyes:     Pupils: Pupils are equal, round, and reactive to light.  Cardiovascular:     Rate and Rhythm: Normal rate and regular rhythm.     Heart sounds: Normal heart sounds. No murmur heard.    No friction rub.  Pulmonary:     Effort: Pulmonary effort is normal. Tachypnea present.     Breath sounds: Wheezing and rhonchi present. No rales.  Abdominal:     General: Bowel sounds are normal. There is no distension.     Palpations: Abdomen is soft.     Tenderness: There is no abdominal tenderness. There is no guarding or rebound.  Musculoskeletal:        General: No tenderness. Normal range of motion.     Right lower leg: No edema.     Left lower leg: No edema.     Comments: No edema  Skin:    General: Skin is warm and dry.     Findings: No rash.  Neurological:     Mental Status: She is alert and oriented to person, place, and time. Mental status is at baseline.     Cranial Nerves: No cranial nerve deficit.  Psychiatric:        Behavior: Behavior normal.     ED Results / Procedures / Treatments   Labs (all labs ordered are listed, but only abnormal results are displayed) Labs Reviewed  BASIC METABOLIC PANEL - Abnormal; Notable for the following components:      Result Value   Potassium 2.8 (*)    CO2 21 (*)    Glucose, Bld 137 (*)    Creatinine, Ser 1.09 (*)    GFR, Estimated 53 (*)    Anion gap 16 (*)    All other components within normal limits  CBC WITH DIFFERENTIAL/PLATELET - Abnormal; Notable for the following components:   WBC 3.0 (*)    Hemoglobin 15.2 (*)    All other components within normal limits  HEPATIC FUNCTION PANEL - Abnormal;  Notable for the following components:   AST 79 (*)    ALT 82 (*)    Alkaline Phosphatase 178 (*)    All other components within normal limits  LACTIC ACID, PLASMA - Abnormal; Notable for the following components:   Lactic Acid, Venous 5.1 (*)    All other components within normal limits  RESP PANEL  BY RT-PCR (RSV, FLU A&B, COVID)  RVPGX2  BRAIN NATRIURETIC PEPTIDE  LIPASE, BLOOD  LACTIC ACID, PLASMA  TROPONIN I (HIGH SENSITIVITY)  TROPONIN I (HIGH SENSITIVITY)    EKG EKG Interpretation Date/Time:  Tuesday November 24 2023 14:15:30 EDT Ventricular Rate:  84 PR Interval:  144 QRS Duration:  86 QT Interval:  404 QTC Calculation: 477 R Axis:   -2  Text Interpretation: Normal sinus rhythm ST & T wave abnormality, consider lateral ischemia Artifact Baseline wander No significant change since last tracing When compared with ECG of 22-Nov-2023 09:46, PREVIOUS ECG IS PRESENT Confirmed by Gwyneth Sprout ([id number]) on 11/24/2023 3:30:57 PM  Radiology DG Chest 1 View Result Date: 11/24/2023 CLINICAL DATA:  pna dib EXAM: CHEST  1 VIEW COMPARISON:  11/22/2023 FINDINGS: Improved aeration. Coarse perihilar interstitial markings. No confluent infiltrate or overt edema. Heart size and mediastinal contours are within normal limits. No effusion. Visualized bones unremarkable. IMPRESSION: Improved aeration with residual perihilar interstitial prominence. Electronically Signed   By: Corlis Leak M.D.   On: 11/24/2023 17:01    Procedures Procedures    Medications Ordered in ED Medications  potassium chloride 10 mEq in 100 mL IVPB (10 mEq Intravenous New Bag/Given 11/24/23 1616)  ipratropium-albuterol (DUONEB) 0.5-2.5 (3) MG/3ML nebulizer solution 3 mL (3 mLs Nebulization Not Given 11/24/23 1711)  lactated ringers infusion (has no administration in time range)  cefTRIAXone (ROCEPHIN) 1 g in sodium chloride 0.9 % 100 mL IVPB (has no administration in time range)  albuterol (PROVENTIL) (2.5 MG/3ML) 0.083%  nebulizer solution 5 mg (5 mg Nebulization Given 11/24/23 1550)  ipratropium (ATROVENT) nebulizer solution 0.5 mg (0.5 mg Nebulization Given 11/24/23 1550)  lactated ringers bolus 1,000 mL (1,000 mLs Intravenous New Bag/Given 11/24/23 1614)  metoCLOPramide (REGLAN) injection 10 mg (10 mg Intravenous Given 11/24/23 1609)    ED Course/ Medical Decision Making/ A&P                                 Medical Decision Making Amount and/or Complexity of Data Reviewed External Data Reviewed: notes. Labs: ordered. Decision-making details documented in ED Course. Radiology: ordered and independent interpretation performed. Decision-making details documented in ED Course. ECG/medicine tests: ordered and independent interpretation performed. Decision-making details documented in ED Course.  Risk Prescription drug management.   Pt with multiple medical problems and comorbidities and presenting today with a complaint that caries a high risk for morbidity and mortality.  Here today with worsening nausea, vomiting, diarrhea, cough and shortness of breath.  Patient seen 2 days ago with similar symptoms diagnosed with pneumonia and started on azithromycin and Augmentin but she continues to be symptomatic is not taking in p.o.'s and is having persistent vomiting and diarrhea.  Patient is wheezing on exam and sats are 99% on room air but she is tachypneic and looks labored.  She was given albuterol, Atrovent and placed on 2 L nasal cannula.  I independently interpreted patient's EKG and labs.  EKG with nonspecific ST changes that are not significantly different from 2 days ago.  BMP is showing worsening hypokalemia of 2.8 which would be consistent with patient's report of vomiting and diarrhea, creatinine is unchanged at 1.09 and anion gap of 16, CBC with a leukopenia with a white count of 3.0 and a hemoglobin of 15, viral panel is negative and troponin is 3.  During patient's last emergency room visit 2 days ago she had  elevated LFTs and  a negative right upper quadrant ultrasound but will ensure those are not worsening and given anion gap we will also check a lactate. I have independently visualized and interpreted pt's images today.  Chest x-ray without any focal consolidation similar to 2 days ago.  Patient given IV fluids and nebs.  Given patient's ongoing symptoms, recent evaluation and worsening symptoms with attempted outpatient therapy feel that patient would benefit from admission ongoing treatment of her respiratory symptoms.  LFTs are improving today, lactate however was elevated at 5.1.  Patient is getting fluid boluses but will recheck.  She remains hemodynamically stable.  Will admit for further care.  Patient given IV Rocephin and she has already had a dose of azithromycin today.  Feel that patient meets admission criteria.  Hospitalist consulted for admission.        Final Clinical Impression(s) / ED Diagnoses Final diagnoses:  Bronchitis  Dehydration  Hypokalemia  Nausea vomiting and diarrhea    Rx / DC Orders ED Discharge Orders     None         Gwyneth Sprout, MD 11/24/23 1715

## 2023-11-24 NOTE — ED Notes (Signed)
 RT assessed the Pt in the lobby. Her oxygen 94% on RA and her HR was 88. The Pt stated that she is having diarrhea and has double PNA

## 2023-11-25 DIAGNOSIS — K219 Gastro-esophageal reflux disease without esophagitis: Secondary | ICD-10-CM | POA: Diagnosis present

## 2023-11-25 DIAGNOSIS — F32A Depression, unspecified: Secondary | ICD-10-CM | POA: Diagnosis present

## 2023-11-25 DIAGNOSIS — I739 Peripheral vascular disease, unspecified: Secondary | ICD-10-CM | POA: Diagnosis present

## 2023-11-25 DIAGNOSIS — J40 Bronchitis, not specified as acute or chronic: Secondary | ICD-10-CM | POA: Diagnosis present

## 2023-11-25 DIAGNOSIS — Z79890 Hormone replacement therapy (postmenopausal): Secondary | ICD-10-CM | POA: Diagnosis not present

## 2023-11-25 DIAGNOSIS — Z85828 Personal history of other malignant neoplasm of skin: Secondary | ICD-10-CM | POA: Diagnosis not present

## 2023-11-25 DIAGNOSIS — E872 Acidosis, unspecified: Secondary | ICD-10-CM | POA: Diagnosis present

## 2023-11-25 DIAGNOSIS — F419 Anxiety disorder, unspecified: Secondary | ICD-10-CM | POA: Diagnosis present

## 2023-11-25 DIAGNOSIS — Z66 Do not resuscitate: Secondary | ICD-10-CM | POA: Diagnosis present

## 2023-11-25 DIAGNOSIS — E860 Dehydration: Secondary | ICD-10-CM | POA: Diagnosis present

## 2023-11-25 DIAGNOSIS — E66811 Obesity, class 1: Secondary | ICD-10-CM | POA: Diagnosis present

## 2023-11-25 DIAGNOSIS — E063 Autoimmune thyroiditis: Secondary | ICD-10-CM | POA: Diagnosis present

## 2023-11-25 DIAGNOSIS — Z7985 Long-term (current) use of injectable non-insulin antidiabetic drugs: Secondary | ICD-10-CM | POA: Diagnosis not present

## 2023-11-25 DIAGNOSIS — I10 Essential (primary) hypertension: Secondary | ICD-10-CM | POA: Diagnosis present

## 2023-11-25 DIAGNOSIS — A419 Sepsis, unspecified organism: Secondary | ICD-10-CM | POA: Diagnosis present

## 2023-11-25 DIAGNOSIS — Z8249 Family history of ischemic heart disease and other diseases of the circulatory system: Secondary | ICD-10-CM | POA: Diagnosis not present

## 2023-11-25 DIAGNOSIS — J189 Pneumonia, unspecified organism: Secondary | ICD-10-CM

## 2023-11-25 DIAGNOSIS — E876 Hypokalemia: Secondary | ICD-10-CM | POA: Diagnosis present

## 2023-11-25 DIAGNOSIS — R6520 Severe sepsis without septic shock: Secondary | ICD-10-CM | POA: Diagnosis present

## 2023-11-25 DIAGNOSIS — M8580 Other specified disorders of bone density and structure, unspecified site: Secondary | ICD-10-CM | POA: Diagnosis present

## 2023-11-25 DIAGNOSIS — Z1152 Encounter for screening for COVID-19: Secondary | ICD-10-CM | POA: Diagnosis not present

## 2023-11-25 DIAGNOSIS — G43909 Migraine, unspecified, not intractable, without status migrainosus: Secondary | ICD-10-CM | POA: Diagnosis present

## 2023-11-25 DIAGNOSIS — E782 Mixed hyperlipidemia: Secondary | ICD-10-CM | POA: Diagnosis present

## 2023-11-25 DIAGNOSIS — D709 Neutropenia, unspecified: Secondary | ICD-10-CM | POA: Diagnosis present

## 2023-11-25 LAB — COMPREHENSIVE METABOLIC PANEL
ALT: 60 U/L — ABNORMAL HIGH (ref 0–44)
AST: 53 U/L — ABNORMAL HIGH (ref 15–41)
Albumin: 3.3 g/dL — ABNORMAL LOW (ref 3.5–5.0)
Alkaline Phosphatase: 127 U/L — ABNORMAL HIGH (ref 38–126)
Anion gap: 10 (ref 5–15)
BUN: 8 mg/dL (ref 8–23)
CO2: 23 mmol/L (ref 22–32)
Calcium: 9 mg/dL (ref 8.9–10.3)
Chloride: 105 mmol/L (ref 98–111)
Creatinine, Ser: 0.65 mg/dL (ref 0.44–1.00)
GFR, Estimated: >60 mL/min (ref >60)
Glucose, Bld: 114 mg/dL — ABNORMAL HIGH (ref 70–99)
Potassium: 3.3 mmol/L — ABNORMAL LOW (ref 3.5–5.1)
Sodium: 138 mmol/L (ref 135–145)
Total Bilirubin: 0.7 mg/dL (ref 0.0–1.2)
Total Protein: 6 g/dL — ABNORMAL LOW (ref 6.5–8.1)

## 2023-11-25 LAB — CBC
HCT: 39.6 % (ref 36.0–46.0)
Hemoglobin: 12.9 g/dL (ref 12.0–15.0)
MCH: 30.6 pg (ref 26.0–34.0)
MCHC: 32.6 g/dL (ref 30.0–36.0)
MCV: 94.1 fL (ref 80.0–100.0)
Platelets: 166 10*3/uL (ref 150–400)
RBC: 4.21 MIL/uL (ref 3.87–5.11)
RDW: 14.2 % (ref 11.5–15.5)
WBC: 3.5 10*3/uL — ABNORMAL LOW (ref 4.0–10.5)
nRBC: 0 % (ref 0.0–0.2)

## 2023-11-25 LAB — HIV ANTIBODY (ROUTINE TESTING W REFLEX): HIV Screen 4th Generation wRfx: NONREACTIVE

## 2023-11-25 MED ORDER — POTASSIUM CHLORIDE CRYS ER 20 MEQ PO TBCR
40.0000 meq | EXTENDED_RELEASE_TABLET | Freq: Once | ORAL | Status: DC
  Filled 2023-11-25: qty 2

## 2023-11-25 MED ORDER — SODIUM CHLORIDE 0.9 % IV SOLN
12.5000 mg | Freq: Four times a day (QID) | INTRAVENOUS | Status: DC | PRN
  Administered 2023-11-25: 12.5 mg via INTRAVENOUS
  Filled 2023-11-25: qty 12.5
  Filled 2023-11-25: qty 0.5

## 2023-11-25 MED ORDER — GABAPENTIN 400 MG PO CAPS
1200.0000 mg | ORAL_CAPSULE | Freq: Every day | ORAL | Status: DC
Start: 2023-11-25 — End: 2023-11-27
  Administered 2023-11-25 – 2023-11-26 (×2): 1200 mg via ORAL
  Filled 2023-11-25 (×2): qty 3

## 2023-11-25 MED ORDER — PROCHLORPERAZINE EDISYLATE 10 MG/2ML IJ SOLN
10.0000 mg | Freq: Four times a day (QID) | INTRAMUSCULAR | Status: DC | PRN
  Administered 2023-11-25 – 2023-11-26 (×3): 10 mg via INTRAVENOUS
  Filled 2023-11-25 (×3): qty 2

## 2023-11-25 MED ORDER — IPRATROPIUM-ALBUTEROL 0.5-2.5 (3) MG/3ML IN SOLN
3.0000 mL | Freq: Three times a day (TID) | RESPIRATORY_TRACT | Status: DC
  Administered 2023-11-25 – 2023-11-27 (×5): 3 mL via RESPIRATORY_TRACT
  Filled 2023-11-25 (×5): qty 3

## 2023-11-25 MED ORDER — ALPRAZOLAM 0.5 MG PO TABS
0.5000 mg | ORAL_TABLET | Freq: Every evening | ORAL | Status: DC | PRN
  Filled 2023-11-25: qty 1

## 2023-11-25 MED ORDER — ORAL CARE MOUTH RINSE: 15.0000 mL | OROMUCOSAL | Status: DC | PRN

## 2023-11-25 NOTE — Plan of Care (Signed)

## 2023-11-25 NOTE — Assessment & Plan Note (Signed)
-   s/p Rocephin and azithromycin; transitioned to amoxicillin and azithromycin to complete course at discharge -Prior CXR on 11/22/2023 showed patchy bandlike basilar opacities; repeat CXR on admission showed improved aeration -Evaluated by PT prior to discharge as well with no needs

## 2023-11-25 NOTE — Progress Notes (Signed)
 Progress Note    [name] Nolan   ZOX:[id number]  DOB: [birth year]  DOA: 11/24/2023     0 PCP: Tollie Eth, NP  Initial CC: Weakness, nausea, cough, shortness of breath  Hospital Course: [name] is a 78 yo female with PMH migraines, anxiety/depression, HLD, hypothyroidism who presented to [facility] with worsening symptoms of diarrhea, weakness, N/V, coughing, and generalized aches. She was recently seen at [facility] on 3/9 for similar sxms and did not wish for admission at that time.  CXR showed patchy bandlike basilar opacities.  She was discharged with Augmentin and azithromycin along with albuterol inhaler. She has continued to have difficulty maintaining adequate oral nutrition at home with ongoing vomiting and diarrhea and overall worsening symptoms, therefore presented back to [facility].  She was noted to be afebrile with stable vitals but had mild tachypnea and increased work of breathing and was placed on oxygen. Lab workup also remarkable for hypokalemia, 2.8 WBC further down trended to 3 from prior level of 4.1 on 11/22/2023. Lactic returned elevated at 5.1 but was considered to be partially due to volume depletion.  She had already taken azithromycin at home and was therefore given a dose of Rocephin at [facility].  She is admitted for ongoing IV antibiotics and further workup and rehydration.  Interval History:  Still fatigued appearing this morning.  Ongoing nausea requiring multiple antiemetics.  Assessment and Plan: * CAP (community acquired pneumonia) - Continue Rocephin and azithromycin -Prior CXR on 11/22/2023 showed patchy bandlike basilar opacities; repeat CXR on admission showed improved aeration  GERD (gastroesophageal reflux disease) - Continue PPI  Hypokalemia - Replete as needed  Depression with anxiety - Continue Effexor    Old records reviewed in assessment of this patient  Antimicrobials: Azithromycin 11/24/2023 >> current Rocephin 11/24/2023 >> current  DVT  prophylaxis:  enoxaparin (LOVENOX) injection 40 mg Start: 11/25/23 1000   Code Status:   Code Status: Limited: Do not attempt resuscitation (DNR) -DNR-LIMITED -Do Not Intubate/DNI   Mobility Assessment (Last 72 Hours)     Mobility Assessment     Row Name 11/25/23 1200 11/24/23 2000         Does patient have an order for bedrest or is patient medically unstable No - Continue assessment No - Continue assessment      What is the highest level of mobility based on the progressive mobility assessment? Level 4 (Walks with assist in room) - Balance while marching in place and cannot step forward and back - Complete Level 6 (Walks independently in room and hall) - Balance while walking in room without assist - Complete               Barriers to discharge: None Disposition Plan: Home HH orders placed:  Status is: Inpatient  Objective: Blood pressure (!) 163/80, pulse (!) 55, temperature (!) 97.4 F (36.3 C), temperature source Oral, resp. rate 18, height 5\' 5"  (1.651 m), weight 85.9 kg, SpO2 99%.  Examination:  Physical Exam Constitutional:      General: She is not in acute distress.    Comments: Lethargic appearing  HENT:     Head: Normocephalic and atraumatic.     Mouth/Throat:     Mouth: Mucous membranes are moist.  Eyes:     Extraocular Movements: Extraocular movements intact.  Cardiovascular:     Rate and Rhythm: Normal rate and regular rhythm.  Pulmonary:     Effort: Pulmonary effort is normal. No respiratory distress.     Breath sounds: Rhonchi  present. No wheezing.  Abdominal:     General: Bowel sounds are normal. There is no distension.     Palpations: Abdomen is soft.     Tenderness: There is no abdominal tenderness.  Musculoskeletal:        General: Normal range of motion.     Cervical back: Normal range of motion and neck supple.  Skin:    General: Skin is warm and dry.  Neurological:     General: No focal deficit present.     Mental Status: She is alert.   Psychiatric:        Mood and Affect: Mood normal.      Consultants:    Procedures:    Data Reviewed: Results for orders placed or performed during the hospital encounter of 11/24/23 (from the past 24 hours)  Lactic acid, plasma     Status: Abnormal   Collection Time: 11/24/23  4:12 PM  Result Value Ref Range   Lactic Acid, Venous 5.1 (HH) 0.5 - 1.9 mmol/L  Troponin I (High Sensitivity)     Status: None   Collection Time: 11/24/23  4:12 PM  Result Value Ref Range   Troponin I (High Sensitivity) 3 <18 ng/L  Lactic acid, plasma     Status: Abnormal   Collection Time: 11/24/23  6:23 PM  Result Value Ref Range   Lactic Acid, Venous 4.7 (HH) 0.5 - 1.9 mmol/L  HIV Antibody (routine testing w rflx)     Status: None   Collection Time: 11/25/23  5:14 AM  Result Value Ref Range   HIV Screen 4th Generation wRfx Non Reactive Non Reactive  Comprehensive metabolic panel     Status: Abnormal   Collection Time: 11/25/23  5:14 AM  Result Value Ref Range   Sodium 138 135 - 145 mmol/L   Potassium 3.3 (L) 3.5 - 5.1 mmol/L   Chloride 105 98 - 111 mmol/L   CO2 23 22 - 32 mmol/L   Glucose, Bld 114 (H) 70 - 99 mg/dL   BUN 8 8 - 23 mg/dL   Creatinine, Ser 9.56 0.44 - 1.00 mg/dL   Calcium 9.0 8.9 - 21.3 mg/dL   Total Protein 6.0 (L) 6.5 - 8.1 g/dL   Albumin 3.3 (L) 3.5 - 5.0 g/dL   AST 53 (H) 15 - 41 U/L   ALT 60 (H) 0 - 44 U/L   Alkaline Phosphatase 127 (H) 38 - 126 U/L   Total Bilirubin 0.7 0.0 - 1.2 mg/dL   GFR, Estimated >08 >65 mL/min   Anion gap 10 5 - 15  CBC     Status: Abnormal   Collection Time: 11/25/23  5:14 AM  Result Value Ref Range   WBC 3.5 (L) 4.0 - 10.5 K/uL   RBC 4.21 3.87 - 5.11 MIL/uL   Hemoglobin 12.9 12.0 - 15.0 g/dL   HCT 78.4 69.6 - 29.5 %   MCV 94.1 80.0 - 100.0 fL   MCH 30.6 26.0 - 34.0 pg   MCHC 32.6 30.0 - 36.0 g/dL   RDW 28.4 13.2 - 44.0 %   Platelets 166 150 - 400 K/uL   nRBC 0.0 0.0 - 0.2 %    I have reviewed pertinent nursing notes, vitals, labs,  and images as necessary. I have ordered labwork to follow up on as indicated.  I have reviewed the last notes from staff over past 24 hours. I have discussed patient's care plan and test results with nursing staff, CM/SW, and other staff as appropriate.  Time spent: Greater than 50% of the 55 minute visit was spent in counseling/coordination of care for the patient as laid out in the A&P.   LOS: 0 days   Lewie Chamber, MD [facility] 11/25/2023, 2:49 PM

## 2023-11-25 NOTE — Assessment & Plan Note (Signed)
 Continue PPI ?

## 2023-11-25 NOTE — Assessment & Plan Note (Signed)
 Repleted.

## 2023-11-25 NOTE — Hospital Course (Signed)
 [name] is a 78 yo female with PMH migraines, anxiety/depression, HLD, hypothyroidism who presented to [facility] with worsening symptoms of diarrhea, weakness, N/V, coughing, and generalized aches. She was recently seen at [facility] on 3/9 for similar sxms and did not wish for admission at that time.  CXR showed patchy bandlike basilar opacities.  She was discharged with Augmentin and azithromycin along with albuterol inhaler. She has continued to have difficulty maintaining adequate oral nutrition at home with ongoing vomiting and diarrhea and overall worsening symptoms, therefore presented back to [facility].  She was noted to be afebrile with stable vitals but had mild tachypnea and increased work of breathing and was placed on oxygen. Lab workup also remarkable for hypokalemia, 2.8 WBC further down trended to 3 from prior level of 4.1 on 11/22/2023. Lactic returned elevated at 5.1 but was considered to be partially due to volume depletion.  She had already taken azithromycin at home and was therefore given a dose of Rocephin at [facility].  She is admitted for ongoing IV antibiotics and further workup and rehydration.

## 2023-11-25 NOTE — Assessment & Plan Note (Signed)
 Continue Effexor.

## 2023-11-26 DIAGNOSIS — J189 Pneumonia, unspecified organism: Secondary | ICD-10-CM | POA: Diagnosis not present

## 2023-11-26 LAB — CBC WITH DIFFERENTIAL/PLATELET
Abs Immature Granulocytes: 0.02 10*3/uL (ref 0.00–0.07)
Basophils Absolute: 0 10*3/uL (ref 0.0–0.1)
Basophils Relative: 0 %
Eosinophils Absolute: 0.1 10*3/uL (ref 0.0–0.5)
Eosinophils Relative: 1 %
HCT: 44.4 % (ref 36.0–46.0)
Hemoglobin: 14.2 g/dL (ref 12.0–15.0)
Immature Granulocytes: 0 %
Lymphocytes Relative: 36 %
Lymphs Abs: 2.1 10*3/uL (ref 0.7–4.0)
MCH: 31.1 pg (ref 26.0–34.0)
MCHC: 32 g/dL (ref 30.0–36.0)
MCV: 97.2 fL (ref 80.0–100.0)
Monocytes Absolute: 0.4 10*3/uL (ref 0.1–1.0)
Monocytes Relative: 7 %
Neutro Abs: 3.2 10*3/uL (ref 1.7–7.7)
Neutrophils Relative %: 56 %
Platelets: 178 10*3/uL (ref 150–400)
RBC: 4.57 MIL/uL (ref 3.87–5.11)
RDW: 14.6 % (ref 11.5–15.5)
WBC: 5.8 10*3/uL (ref 4.0–10.5)
nRBC: 0 % (ref 0.0–0.2)

## 2023-11-26 LAB — BASIC METABOLIC PANEL
Anion gap: 11 (ref 5–15)
BUN: 8 mg/dL (ref 8–23)
CO2: 22 mmol/L (ref 22–32)
Calcium: 9 mg/dL (ref 8.9–10.3)
Chloride: 109 mmol/L (ref 98–111)
Creatinine, Ser: 0.82 mg/dL (ref 0.44–1.00)
GFR, Estimated: >60 mL/min (ref >60)
Glucose, Bld: 87 mg/dL (ref 70–99)
Potassium: 3.1 mmol/L — ABNORMAL LOW (ref 3.5–5.1)
Sodium: 142 mmol/L (ref 135–145)

## 2023-11-26 LAB — LACTIC ACID, PLASMA: Lactic Acid, Venous: 1.2 mmol/L (ref 0.5–1.9)

## 2023-11-26 LAB — MAGNESIUM: Magnesium: 1.9 mg/dL (ref 1.7–2.4)

## 2023-11-26 MED ORDER — POTASSIUM CHLORIDE CRYS ER 20 MEQ PO TBCR
40.0000 meq | EXTENDED_RELEASE_TABLET | ORAL | Status: AC
  Administered 2023-11-26 (×2): 40 meq via ORAL
  Filled 2023-11-26 (×2): qty 2

## 2023-11-26 MED ORDER — GUAIFENESIN-DM 100-10 MG/5ML PO SYRP
5.0000 mL | ORAL_SOLUTION | ORAL | Status: DC | PRN
  Administered 2023-11-26 – 2023-11-27 (×2): 5 mL via ORAL
  Filled 2023-11-26 (×2): qty 10

## 2023-11-26 NOTE — Progress Notes (Signed)
 Progress Note    [name] Nolan   [id number]:[id number]  DOB: June 22, 1946  DOA: 11/24/2023     1 PCP: [name] Eth, NP  Initial CC: Weakness, nausea, cough, shortness of breath  Hospital Course: [name] is a 78 yo female with PMH migraines, anxiety/depression, HLD, hypothyroidism who presented to [facility] with worsening symptoms of diarrhea, weakness, N/V, coughing, and generalized aches. She was recently seen at [facility] on 3/9 for similar sxms and did not wish for admission at that time.  CXR showed patchy bandlike basilar opacities.  She was discharged with Augmentin and azithromycin along with albuterol inhaler. She has continued to have difficulty maintaining adequate oral nutrition at home with ongoing vomiting and diarrhea and overall worsening symptoms, therefore presented back to [facility].  She was noted to be afebrile with stable vitals but had mild tachypnea and increased work of breathing and was placed on oxygen. Lab workup also remarkable for hypokalemia, 2.8 WBC further down trended to 3 from prior level of 4.1 on 11/22/2023. Lactic returned elevated at 5.1 but was considered to be partially due to volume depletion.  She had already taken azithromycin at home and was therefore given a dose of Rocephin at [facility].  She is admitted for ongoing IV antibiotics and further workup and rehydration.  Interval History:  Sounds like breathing is improved, but she's extremely weak/exhausted appearing still.   Assessment and Plan: * CAP (community acquired pneumonia) - Continue Rocephin and azithromycin -Prior CXR on 11/22/2023 showed patchy bandlike basilar opacities; repeat CXR on admission showed improved aeration  GERD (gastroesophageal reflux disease) - Continue PPI  Hypokalemia - Replete as needed  Depression with anxiety - Continue Effexor    Old records reviewed in assessment of this patient  Antimicrobials: Azithromycin 11/24/2023 >> current Rocephin 11/24/2023 >> current  DVT  prophylaxis:  enoxaparin (LOVENOX) injection 40 mg Start: 11/25/23 1000   Code Status:   Code Status: Limited: Do not attempt resuscitation (DNR) -DNR-LIMITED -Do Not Intubate/DNI   Mobility Assessment (Last 72 Hours)     Mobility Assessment     Row Name 11/25/23 1947 11/25/23 1200 11/24/23 2000       Does patient have an order for bedrest or is patient medically unstable No - Continue assessment No - Continue assessment No - Continue assessment     What is the highest level of mobility based on the progressive mobility assessment? Level 5 (Walks with assist in room/hall) - Balance while stepping forward/back and can walk in room with assist - Complete Level 4 (Walks with assist in room) - Balance while marching in place and cannot step forward and back - Complete Level 6 (Walks independently in room and hall) - Balance while walking in room without assist - Complete              Barriers to discharge: None Disposition Plan: Home HH orders placed:  Status is: Inpatient  Objective: Blood pressure (!) 148/87, pulse 95, temperature 98.5 F (36.9 C), temperature source Oral, resp. rate 18, height 5\' 5"  (1.651 m), weight 85.9 kg, SpO2 96%.  Examination:  Physical Exam Constitutional:      General: She is not in acute distress.    Comments: Lethargic appearing  HENT:     Head: Normocephalic and atraumatic.     Mouth/Throat:     Mouth: Mucous membranes are moist.  Eyes:     Extraocular Movements: Extraocular movements intact.  Cardiovascular:     Rate and Rhythm: Normal rate and regular  rhythm.  Pulmonary:     Effort: Pulmonary effort is normal. No respiratory distress.     Breath sounds: Rhonchi present. No wheezing.  Abdominal:     General: Bowel sounds are normal. There is no distension.     Palpations: Abdomen is soft.     Tenderness: There is no abdominal tenderness.  Musculoskeletal:        General: Normal range of motion.     Cervical back: Normal range of motion  and neck supple.  Skin:    General: Skin is warm and dry.  Neurological:     General: No focal deficit present.     Mental Status: She is alert.  Psychiatric:        Mood and Affect: Mood normal.      Consultants:    Procedures:    Data Reviewed: Results for orders placed or performed during the hospital encounter of 11/24/23 (from the past 24 hours)  Basic metabolic panel     Status: Abnormal   Collection Time: 11/26/23  5:07 AM  Result Value Ref Range   Sodium 142 135 - 145 mmol/L   Potassium 3.1 (L) 3.5 - 5.1 mmol/L   Chloride 109 98 - 111 mmol/L   CO2 22 22 - 32 mmol/L   Glucose, Bld 87 70 - 99 mg/dL   BUN 8 8 - 23 mg/dL   Creatinine, Ser 1.91 0.44 - 1.00 mg/dL   Calcium 9.0 8.9 - 47.8 mg/dL   GFR, Estimated >29 >56 mL/min   Anion gap 11 5 - 15  CBC with Differential/Platelet     Status: None   Collection Time: 11/26/23  5:07 AM  Result Value Ref Range   WBC 5.8 4.0 - 10.5 K/uL   RBC 4.57 3.87 - 5.11 MIL/uL   Hemoglobin 14.2 12.0 - 15.0 g/dL   HCT 21.3 08.6 - 57.8 %   MCV 97.2 80.0 - 100.0 fL   MCH 31.1 26.0 - 34.0 pg   MCHC 32.0 30.0 - 36.0 g/dL   RDW 46.9 62.9 - 52.8 %   Platelets 178 150 - 400 K/uL   nRBC 0.0 0.0 - 0.2 %   Neutrophils Relative % 56 %   Neutro Abs 3.2 1.7 - 7.7 K/uL   Lymphocytes Relative 36 %   Lymphs Abs 2.1 0.7 - 4.0 K/uL   Monocytes Relative 7 %   Monocytes Absolute 0.4 0.1 - 1.0 K/uL   Eosinophils Relative 1 %   Eosinophils Absolute 0.1 0.0 - 0.5 K/uL   Basophils Relative 0 %   Basophils Absolute 0.0 0.0 - 0.1 K/uL   Immature Granulocytes 0 %   Abs Immature Granulocytes 0.02 0.00 - 0.07 K/uL  Magnesium     Status: None   Collection Time: 11/26/23  5:07 AM  Result Value Ref Range   Magnesium 1.9 1.7 - 2.4 mg/dL  Lactic acid, plasma     Status: None   Collection Time: 11/26/23  5:07 AM  Result Value Ref Range   Lactic Acid, Venous 1.2 0.5 - 1.9 mmol/L    I have reviewed pertinent nursing notes, vitals, labs, and images as  necessary. I have ordered labwork to follow up on as indicated.  I have reviewed the last notes from staff over past 24 hours. I have discussed patient's care plan and test results with nursing staff, CM/SW, and other staff as appropriate.  Time spent: Greater than 50% of the 55 minute visit was spent in counseling/coordination of care for  the patient as laid out in the A&P.   LOS: 1 day   Lewie Chamber, MD [facility] 11/26/2023, 12:59 PM

## 2023-11-27 ENCOUNTER — Other Ambulatory Visit (HOSPITAL_COMMUNITY): Payer: Self-pay

## 2023-11-27 DIAGNOSIS — J189 Pneumonia, unspecified organism: Secondary | ICD-10-CM | POA: Diagnosis not present

## 2023-11-27 LAB — BASIC METABOLIC PANEL
Anion gap: 9 (ref 5–15)
BUN: 11 mg/dL (ref 8–23)
CO2: 23 mmol/L (ref 22–32)
Calcium: 8.9 mg/dL (ref 8.9–10.3)
Chloride: 103 mmol/L (ref 98–111)
Creatinine, Ser: 0.92 mg/dL (ref 0.44–1.00)
GFR, Estimated: >60 mL/min (ref >60)
Glucose, Bld: 86 mg/dL (ref 70–99)
Potassium: 3.5 mmol/L (ref 3.5–5.1)
Sodium: 135 mmol/L (ref 135–145)

## 2023-11-27 LAB — MAGNESIUM: Magnesium: 1.8 mg/dL (ref 1.7–2.4)

## 2023-11-27 MED ORDER — AMOXICILLIN 875 MG PO TABS
875.0000 mg | ORAL_TABLET | Freq: Two times a day (BID) | ORAL | 0 refills | Status: AC
  Filled 2023-11-27: qty 2, 1d supply, fill #0

## 2023-11-27 MED ORDER — POTASSIUM CHLORIDE CRYS ER 20 MEQ PO TBCR
40.0000 meq | EXTENDED_RELEASE_TABLET | Freq: Once | ORAL | Status: AC
  Administered 2023-11-27: 40 meq via ORAL
  Filled 2023-11-27: qty 2

## 2023-11-27 MED ORDER — MAGNESIUM SULFATE 2 GM/50ML IV SOLN
2.0000 g | Freq: Once | INTRAVENOUS | Status: AC
  Administered 2023-11-27: 2 g via INTRAVENOUS
  Filled 2023-11-27: qty 50

## 2023-11-27 MED ORDER — AZITHROMYCIN 500 MG PO TABS
500.0000 mg | ORAL_TABLET | Freq: Every day | ORAL | 0 refills | Status: AC
  Filled 2023-11-27: qty 1, 1d supply, fill #0

## 2023-11-27 MED ORDER — AZITHROMYCIN 250 MG PO TABS
500.0000 mg | ORAL_TABLET | Freq: Every day | ORAL | Status: DC
  Administered 2023-11-27: 500 mg via ORAL
  Filled 2023-11-27: qty 2

## 2023-11-27 NOTE — Progress Notes (Signed)
   11/27/23 1031  TOC Brief Assessment  Insurance and Status Reviewed  Patient has primary care physician Yes  Home environment has been reviewed home alone  Prior level of function: independent  Prior/Current Home Services No current home services  Social Drivers of Health Review SDOH reviewed no interventions necessary  Readmission risk has been reviewed Yes  Transition of care needs no transition of care needs at this time

## 2023-11-27 NOTE — Discharge Summary (Signed)
 Physician Discharge Summary   Artasia Thang ZOX:[id number] DOB: 03/25/1946 DOA: 11/24/2023  PCP: Tollie Eth, NP  Admit date: 11/24/2023 Discharge date: 11/27/2023   Admitted From: Home Disposition:  Home Discharging physician: Lewie Chamber, MD Barriers to discharge: none  Recommendations at discharge: Continue chronic medical management   Discharge Condition: stable CODE STATUS: DNR Diet recommendation:  Diet Orders (From admission, onward)     Start     Ordered   11/27/23 0000  Diet general        11/27/23 1023   11/26/23 0738  Diet regular Fluid consistency: Thin  Diet effective now       Question:  Fluid consistency:  Answer:  Thin   11/26/23 0737            Hospital Course: [name] is a 78 yo female with PMH migraines, anxiety/depression, HLD, hypothyroidism who presented to [facility] with worsening symptoms of diarrhea, weakness, N/V, coughing, and generalized aches. She was recently seen at [facility] on 3/9 for similar sxms and did not wish for admission at that time.  CXR showed patchy bandlike basilar opacities.  She was discharged with Augmentin and azithromycin along with albuterol inhaler. She has continued to have difficulty maintaining adequate oral nutrition at home with ongoing vomiting and diarrhea and overall worsening symptoms, therefore presented back to [facility].  She was noted to be afebrile with stable vitals but had mild tachypnea and increased work of breathing and was placed on oxygen. Lab workup also remarkable for hypokalemia, 2.8 WBC further down trended to 3 from prior level of 4.1 on 11/22/2023. Lactic returned elevated at 5.1 but was considered to be partially due to volume depletion.  She had already taken azithromycin at home and was therefore given a dose of Rocephin at [facility].  She is admitted for ongoing IV antibiotics and further workup and rehydration.  Assessment and Plan: * CAP (community acquired pneumonia) - s/p Rocephin and  azithromycin; transitioned to amoxicillin and azithromycin to complete course at discharge -Prior CXR on 11/22/2023 showed patchy bandlike basilar opacities; repeat CXR on admission showed improved aeration -Evaluated by PT prior to discharge as well with no needs  GERD (gastroesophageal reflux disease) - Continue PPI  Hypokalemia - Repleted  Depression with anxiety - Continue Effexor   The patient's acute and chronic medical conditions were treated accordingly. On day of discharge, patient was felt deemed stable for discharge. Patient/family member advised to call PCP or come back to ER if needed.   Principal Diagnosis: CAP (community acquired pneumonia)  Discharge Diagnoses: Active Hospital Problems   Diagnosis Date Noted   CAP (community acquired pneumonia) 11/24/2023    Priority: 1.   PAD (peripheral artery disease) (HCC) 07/14/2023   GERD (gastroesophageal reflux disease) 01/06/2022   DNR (do not resuscitate) 11/04/2021   Hypokalemia 11/04/2021   Depression with anxiety 03/09/2016   Mixed hyperlipidemia 05/07/2014   Chronic migraine without aura, intractable, without status migrainosus 07/12/2013    Resolved Hospital Problems  No resolved problems to display.     Discharge Instructions     Diet general   Complete by: As directed    Increase activity slowly   Complete by: As directed       Allergies as of 11/27/2023       Reactions   Statins Other (See Comments)   Muscle pain   Codeine Nausea And Vomiting        Medication List     STOP taking these medications  amoxicillin-clavulanate 875-125 MG tablet Commonly known as: AUGMENTIN       TAKE these medications    albuterol 108 (90 Base) MCG/ACT inhaler Commonly known as: VENTOLIN HFA Inhale 1-2 puffs into the lungs every 6 (six) hours as needed for wheezing or shortness of breath.   ALPRAZolam 0.5 MG tablet Commonly known as: XANAX Take 1 tablet (0.5 mg total) by mouth at bedtime as needed  for anxiety.   amoxicillin 875 MG tablet Commonly known as: AMOXIL Take 1 tablet (875 mg total) by mouth 2 (two) times daily for 1 day. Start taking on: November 28, 2023   azithromycin 500 MG tablet Commonly known as: Zithromax Take 1 tablet (500 mg total) by mouth daily for 1 day. Start taking on: November 28, 2023 What changed:  medication strength how much to take additional instructions   cyclobenzaprine 5 MG tablet Commonly known as: FLEXERIL Take 1 tablet (5 mg total) by mouth 3 (three) times daily as needed for muscle spasms.   gabapentin 300 MG capsule Commonly known as: NEURONTIN Take 4 capsules (1,200 mg total) by mouth at bedtime.   levothyroxine 50 MCG tablet Commonly known as: SYNTHROID Take 1 tablet (50 mcg total) by mouth daily. Except Saturday and Sunday, take 2 tabs   nystatin cream Commonly known as: MYCOSTATIN APPLY TO AFFECTED AREA TWICE A DAY UNTIL RASH IS CLEAR What changed: See the new instructions.   ondansetron 4 MG disintegrating tablet Commonly known as: ZOFRAN-ODT Take 1 tablet (4 mg total) by mouth every 6 (six) hours as needed for nausea or vomiting.   venlafaxine XR 75 MG 24 hr capsule Commonly known as: EFFEXOR-XR Take 2 capsules (150 mg total) by mouth daily with breakfast.        Allergies  Allergen Reactions   Statins Other (See Comments)    Muscle pain   Codeine Nausea And Vomiting    Consultations:  Procedures:   Discharge Exam: BP 139/83 (BP Location: Right Arm)   Pulse 75   Temp 98.1 F (36.7 C) (Oral)   Resp 20   Ht 5\' 5"  (1.651 m)   Wt 87.1 kg   SpO2 94%   BMI 31.95 kg/m  Physical Exam Constitutional:      General: She is not in acute distress.    Comments: Lethargic appearing  HENT:     Head: Normocephalic and atraumatic.     Mouth/Throat:     Mouth: Mucous membranes are moist.  Eyes:     Extraocular Movements: Extraocular movements intact.  Cardiovascular:     Rate and Rhythm: Normal rate and regular  rhythm.  Pulmonary:     Effort: Pulmonary effort is normal. No respiratory distress.     Breath sounds: Rhonchi (Improved) present. No wheezing.  Abdominal:     General: Bowel sounds are normal. There is no distension.     Palpations: Abdomen is soft.     Tenderness: There is no abdominal tenderness.  Musculoskeletal:        General: Normal range of motion.     Cervical back: Normal range of motion and neck supple.  Skin:    General: Skin is warm and dry.  Neurological:     General: No focal deficit present.     Mental Status: She is alert.  Psychiatric:        Mood and Affect: Mood normal.      The results of significant diagnostics from this hospitalization (including imaging, microbiology, ancillary and laboratory) are listed below  for reference.   Microbiology: Recent Results (from the past 240 hours)  Resp panel by RT-PCR (RSV, Flu A&B, Covid) Anterior Nasal Swab     Status: None   Collection Time: 11/22/23  9:51 AM   Specimen: Anterior Nasal Swab  Result Value Ref Range Status   SARS Coronavirus 2 by RT PCR NEGATIVE NEGATIVE Final    Comment: (NOTE) SARS-CoV-2 target nucleic acids are NOT DETECTED.  The SARS-CoV-2 RNA is generally detectable in upper respiratory specimens during the acute phase of infection. The lowest concentration of SARS-CoV-2 viral copies this assay can detect is 138 copies/mL. A negative result does not preclude SARS-Cov-2 infection and should not be used as the sole basis for treatment or other patient management decisions. A negative result may occur with  improper specimen collection/handling, submission of specimen other than nasopharyngeal swab, presence of viral mutation(s) within the areas targeted by this assay, and inadequate number of viral copies(<138 copies/mL). A negative result must be combined with clinical observations, patient history, and epidemiological information. The expected result is Negative.  Fact Sheet for Patients:   BloggerCourse.com  Fact Sheet for Healthcare Providers:  SeriousBroker.it  This test is no t yet approved or cleared by the [place] FDA and  has been authorized for detection and/or diagnosis of SARS-CoV-2 by FDA under an Emergency Use Authorization (EUA). This EUA will remain  in effect (meaning this test can be used) for the duration of the COVID-19 declaration under Section 564(b)(1) of the Act, 21 U.S.C.section 360bbb-3(b)(1), unless the authorization is terminated  or revoked sooner.       Influenza A by PCR NEGATIVE NEGATIVE Final   Influenza B by PCR NEGATIVE NEGATIVE Final    Comment: (NOTE) The Xpert Xpress SARS-CoV-2/FLU/RSV plus assay is intended as an aid in the diagnosis of influenza from Nasopharyngeal swab specimens and should not be used as a sole basis for treatment. Nasal washings and aspirates are unacceptable for Xpert Xpress SARS-CoV-2/FLU/RSV testing.  Fact Sheet for Patients: BloggerCourse.com  Fact Sheet for Healthcare Providers: SeriousBroker.it  This test is not yet approved or cleared by the [place] FDA and has been authorized for detection and/or diagnosis of SARS-CoV-2 by FDA under an Emergency Use Authorization (EUA). This EUA will remain in effect (meaning this test can be used) for the duration of the COVID-19 declaration under Section 564(b)(1) of the Act, 21 U.S.C. section 360bbb-3(b)(1), unless the authorization is terminated or revoked.     Resp Syncytial Virus by PCR NEGATIVE NEGATIVE Final    Comment: (NOTE) Fact Sheet for Patients: BloggerCourse.com  Fact Sheet for Healthcare Providers: SeriousBroker.it  This test is not yet approved or cleared by the [place] FDA and has been authorized for detection and/or diagnosis of SARS-CoV-2 by FDA under an Emergency Use  Authorization (EUA). This EUA will remain in effect (meaning this test can be used) for the duration of the COVID-19 declaration under Section 564(b)(1) of the Act, 21 U.S.C. section 360bbb-3(b)(1), unless the authorization is terminated or revoked.  Performed at [facility], [address], [place], [place] [address]   Resp panel by RT-PCR (RSV, Flu A&B, Covid) Anterior Nasal Swab     Status: None   Collection Time: 11/24/23  2:13 PM   Specimen: Anterior Nasal Swab  Result Value Ref Range Status   SARS Coronavirus 2 by RT PCR NEGATIVE NEGATIVE Final    Comment: (NOTE) SARS-CoV-2 target nucleic acids are NOT DETECTED.  The SARS-CoV-2 RNA is generally detectable  in upper respiratory specimens during the acute phase of infection. The lowest concentration of SARS-CoV-2 viral copies this assay can detect is 138 copies/mL. A negative result does not preclude SARS-Cov-2 infection and should not be used as the sole basis for treatment or other patient management decisions. A negative result may occur with  improper specimen collection/handling, submission of specimen other than nasopharyngeal swab, presence of viral mutation(s) within the areas targeted by this assay, and inadequate number of viral copies(<138 copies/mL). A negative result must be combined with clinical observations, patient history, and epidemiological information. The expected result is Negative.  Fact Sheet for Patients:  BloggerCourse.com  Fact Sheet for Healthcare Providers:  SeriousBroker.it  This test is no t yet approved or cleared by the [place] FDA and  has been authorized for detection and/or diagnosis of SARS-CoV-2 by FDA under an Emergency Use Authorization (EUA). This EUA will remain  in effect (meaning this test can be used) for the duration of the COVID-19 declaration under Section 564(b)(1) of the Act, 21 U.S.C.section  360bbb-3(b)(1), unless the authorization is terminated  or revoked sooner.       Influenza A by PCR NEGATIVE NEGATIVE Final   Influenza B by PCR NEGATIVE NEGATIVE Final    Comment: (NOTE) The Xpert Xpress SARS-CoV-2/FLU/RSV plus assay is intended as an aid in the diagnosis of influenza from Nasopharyngeal swab specimens and should not be used as a sole basis for treatment. Nasal washings and aspirates are unacceptable for Xpert Xpress SARS-CoV-2/FLU/RSV testing.  Fact Sheet for Patients: BloggerCourse.com  Fact Sheet for Healthcare Providers: SeriousBroker.it  This test is not yet approved or cleared by the [place] FDA and has been authorized for detection and/or diagnosis of SARS-CoV-2 by FDA under an Emergency Use Authorization (EUA). This EUA will remain in effect (meaning this test can be used) for the duration of the COVID-19 declaration under Section 564(b)(1) of the Act, 21 U.S.C. section 360bbb-3(b)(1), unless the authorization is terminated or revoked.     Resp Syncytial Virus by PCR NEGATIVE NEGATIVE Final    Comment: (NOTE) Fact Sheet for Patients: BloggerCourse.com  Fact Sheet for Healthcare Providers: SeriousBroker.it  This test is not yet approved or cleared by the [place] FDA and has been authorized for detection and/or diagnosis of SARS-CoV-2 by FDA under an Emergency Use Authorization (EUA). This EUA will remain in effect (meaning this test can be used) for the duration of the COVID-19 declaration under Section 564(b)(1) of the Act, 21 U.S.C. section 360bbb-3(b)(1), unless the authorization is terminated or revoked.  Performed at [facility], [address] [place], [place] [address]      Labs: BNP (last 3 results) Recent Labs    11/24/23 1413  BNP 35.7   Basic Metabolic Panel: Recent Labs  Lab 11/22/23 0951  11/24/23 1413 11/25/23 0514 11/26/23 0507 11/27/23 0443  NA 137 138 138 142 135  K 3.0* 2.8* 3.3* 3.1* 3.5  CL 101 101 105 109 103  CO2 26 21* 23 22 23   GLUCOSE 90 137* 114* 87 86  BUN 12 8 8 8 11   CREATININE 1.08* 1.09* 0.65 0.82 0.92  CALCIUM 9.4 10.0 9.0 9.0 8.9  MG  --   --   --  1.9 1.8   Liver Function Tests: Recent Labs  Lab 11/22/23 0951 11/24/23 1413 11/25/23 0514  AST 303* 79* 53*  ALT 150* 82* 60*  ALKPHOS 217* 178* 127*  BILITOT 1.5* 1.0 0.7  PROT 7.1 7.3 6.0*  ALBUMIN 4.5 4.5 3.3*   Recent Labs  Lab 11/22/23 0951 11/24/23 1413  LIPASE 43 20   No results for input(s): "AMMONIA" in the last 168 hours. CBC: Recent Labs  Lab 11/22/23 0951 11/24/23 1413 11/25/23 0514 11/26/23 0507  WBC 4.1 3.0* 3.5* 5.8  NEUTROABS  --  2.0  --  3.2  HGB 14.8 15.2* 12.9 14.2  HCT 43.9 44.0 39.6 44.4  MCV 91.1 91.1 94.1 97.2  PLT 193 204 166 178   Cardiac Enzymes: No results for input(s): "CKTOTAL", "CKMB", "CKMBINDEX", "TROPONINI" in the last 168 hours. BNP: Invalid input(s): "POCBNP" CBG: No results for input(s): "GLUCAP" in the last 168 hours. D-Dimer No results for input(s): "DDIMER" in the last 72 hours. Hgb A1c No results for input(s): "HGBA1C" in the last 72 hours. Lipid Profile No results for input(s): "CHOL", "HDL", "LDLCALC", "TRIG", "CHOLHDL", "LDLDIRECT" in the last 72 hours. Thyroid function studies No results for input(s): "TSH", "T4TOTAL", "T3FREE", "THYROIDAB" in the last 72 hours.  Invalid input(s): "FREET3" Anemia work up No results for input(s): "VITAMINB12", "FOLATE", "FERRITIN", "TIBC", "IRON", "RETICCTPCT" in the last 72 hours. Urinalysis    Component Value Date/Time   COLORURINE YELLOW 11/22/2023 1309   APPEARANCEUR CLEAR 11/22/2023 1309   LABSPEC 1.006 11/22/2023 1309   PHURINE 6.0 11/22/2023 1309   GLUCOSEU NEGATIVE 11/22/2023 1309   HGBUR SMALL (A) 11/22/2023 1309   BILIRUBINUR NEGATIVE 11/22/2023 1309   BILIRUBINUR neg  08/24/2014 1341   KETONESUR NEGATIVE 11/22/2023 1309   PROTEINUR NEGATIVE 11/22/2023 1309   UROBILINOGEN negative 08/24/2014 1341   NITRITE NEGATIVE 11/22/2023 1309   LEUKOCYTESUR SMALL (A) 11/22/2023 1309   Sepsis Labs Recent Labs  Lab 11/22/23 0951 11/24/23 1413 11/25/23 0514 11/26/23 0507  WBC 4.1 3.0* 3.5* 5.8   Microbiology Recent Results (from the past 240 hours)  Resp panel by RT-PCR (RSV, Flu A&B, Covid) Anterior Nasal Swab     Status: None   Collection Time: 11/22/23  9:51 AM   Specimen: Anterior Nasal Swab  Result Value Ref Range Status   SARS Coronavirus 2 by RT PCR NEGATIVE NEGATIVE Final    Comment: (NOTE) SARS-CoV-2 target nucleic acids are NOT DETECTED.  The SARS-CoV-2 RNA is generally detectable in upper respiratory specimens during the acute phase of infection. The lowest concentration of SARS-CoV-2 viral copies this assay can detect is 138 copies/mL. A negative result does not preclude SARS-Cov-2 infection and should not be used as the sole basis for treatment or other patient management decisions. A negative result may occur with  improper specimen collection/handling, submission of specimen other than nasopharyngeal swab, presence of viral mutation(s) within the areas targeted by this assay, and inadequate number of viral copies(<138 copies/mL). A negative result must be combined with clinical observations, patient history, and epidemiological information. The expected result is Negative.  Fact Sheet for Patients:  BloggerCourse.com  Fact Sheet for Healthcare Providers:  SeriousBroker.it  This test is no t yet approved or cleared by the [place] FDA and  has been authorized for detection and/or diagnosis of SARS-CoV-2 by FDA under an Emergency Use Authorization (EUA). This EUA will remain  in effect (meaning this test can be used) for the duration of the COVID-19 declaration under Section  564(b)(1) of the Act, 21 U.S.C.section 360bbb-3(b)(1), unless the authorization is terminated  or revoked sooner.       Influenza A by PCR NEGATIVE NEGATIVE Final   Influenza B by PCR NEGATIVE NEGATIVE Final    Comment: (NOTE) The Xpert  Xpress SARS-CoV-2/FLU/RSV plus assay is intended as an aid in the diagnosis of influenza from Nasopharyngeal swab specimens and should not be used as a sole basis for treatment. Nasal washings and aspirates are unacceptable for Xpert Xpress SARS-CoV-2/FLU/RSV testing.  Fact Sheet for Patients: BloggerCourse.com  Fact Sheet for Healthcare Providers: SeriousBroker.it  This test is not yet approved or cleared by the [place] FDA and has been authorized for detection and/or diagnosis of SARS-CoV-2 by FDA under an Emergency Use Authorization (EUA). This EUA will remain in effect (meaning this test can be used) for the duration of the COVID-19 declaration under Section 564(b)(1) of the Act, 21 U.S.C. section 360bbb-3(b)(1), unless the authorization is terminated or revoked.     Resp Syncytial Virus by PCR NEGATIVE NEGATIVE Final    Comment: (NOTE) Fact Sheet for Patients: BloggerCourse.com  Fact Sheet for Healthcare Providers: SeriousBroker.it  This test is not yet approved or cleared by the [place] FDA and has been authorized for detection and/or diagnosis of SARS-CoV-2 by FDA under an Emergency Use Authorization (EUA). This EUA will remain in effect (meaning this test can be used) for the duration of the COVID-19 declaration under Section 564(b)(1) of the Act, 21 U.S.C. section 360bbb-3(b)(1), unless the authorization is terminated or revoked.  Performed at [facility], [address] [place], [place] [address]   Resp panel by RT-PCR (RSV, Flu A&B, Covid) Anterior Nasal Swab     Status: None   Collection Time:  11/24/23  2:13 PM   Specimen: Anterior Nasal Swab  Result Value Ref Range Status   SARS Coronavirus 2 by RT PCR NEGATIVE NEGATIVE Final    Comment: (NOTE) SARS-CoV-2 target nucleic acids are NOT DETECTED.  The SARS-CoV-2 RNA is generally detectable in upper respiratory specimens during the acute phase of infection. The lowest concentration of SARS-CoV-2 viral copies this assay can detect is 138 copies/mL. A negative result does not preclude SARS-Cov-2 infection and should not be used as the sole basis for treatment or other patient management decisions. A negative result may occur with  improper specimen collection/handling, submission of specimen other than nasopharyngeal swab, presence of viral mutation(s) within the areas targeted by this assay, and inadequate number of viral copies(<138 copies/mL). A negative result must be combined with clinical observations, patient history, and epidemiological information. The expected result is Negative.  Fact Sheet for Patients:  BloggerCourse.com  Fact Sheet for Healthcare Providers:  SeriousBroker.it  This test is no t yet approved or cleared by the [place] FDA and  has been authorized for detection and/or diagnosis of SARS-CoV-2 by FDA under an Emergency Use Authorization (EUA). This EUA will remain  in effect (meaning this test can be used) for the duration of the COVID-19 declaration under Section 564(b)(1) of the Act, 21 U.S.C.section 360bbb-3(b)(1), unless the authorization is terminated  or revoked sooner.       Influenza A by PCR NEGATIVE NEGATIVE Final   Influenza B by PCR NEGATIVE NEGATIVE Final    Comment: (NOTE) The Xpert Xpress SARS-CoV-2/FLU/RSV plus assay is intended as an aid in the diagnosis of influenza from Nasopharyngeal swab specimens and should not be used as a sole basis for treatment. Nasal washings and aspirates are unacceptable for Xpert Xpress  SARS-CoV-2/FLU/RSV testing.  Fact Sheet for Patients: BloggerCourse.com  Fact Sheet for Healthcare Providers: SeriousBroker.it  This test is not yet approved or cleared by the [place] FDA and has been authorized for detection and/or diagnosis of SARS-CoV-2 by FDA under  an Emergency Use Authorization (EUA). This EUA will remain in effect (meaning this test can be used) for the duration of the COVID-19 declaration under Section 564(b)(1) of the Act, 21 U.S.C. section 360bbb-3(b)(1), unless the authorization is terminated or revoked.     Resp Syncytial Virus by PCR NEGATIVE NEGATIVE Final    Comment: (NOTE) Fact Sheet for Patients: BloggerCourse.com  Fact Sheet for Healthcare Providers: SeriousBroker.it  This test is not yet approved or cleared by the [place] FDA and has been authorized for detection and/or diagnosis of SARS-CoV-2 by FDA under an Emergency Use Authorization (EUA). This EUA will remain in effect (meaning this test can be used) for the duration of the COVID-19 declaration under Section 564(b)(1) of the Act, 21 U.S.C. section 360bbb-3(b)(1), unless the authorization is terminated or revoked.  Performed at [facility], [address] [place], [place] [address]     Procedures/Studies: DG Chest 1 View Result Date: 11/24/2023 CLINICAL DATA:  pna dib EXAM: CHEST  1 VIEW COMPARISON:  11/22/2023 FINDINGS: Improved aeration. Coarse perihilar interstitial markings. No confluent infiltrate or overt edema. Heart size and mediastinal contours are within normal limits. No effusion. Visualized bones unremarkable. IMPRESSION: Improved aeration with residual perihilar interstitial prominence. Electronically Signed   By: Corlis Leak M.D.   On: 11/24/2023 17:01   US Abdomen Limited RUQ (LIVER/GB) Result Date: 11/22/2023 CLINICAL DATA:  [id number] RUQ  abdominal pain [id number] EXAM: ULTRASOUND ABDOMEN LIMITED RIGHT UPPER QUADRANT COMPARISON:  November 03, 2021. FINDINGS: Evaluation is limited by problems with breath hold instructions, shadowing bowel gas and limited acoustic windows. Gallbladder: Surgically absent Common bile duct: Diameter: Visualized portion measures 10 mm, noted. This appears similar compared to prior CT and is likely due to post cholecystectomy reservoir effect. Mild central biliary ductal prominence, similar in comparison to prior. Liver: No definitive focal lesion identified. Upper limits of normal and mildly heterogeneous in parenchymal echogenicity. Portal vein is patent on color Doppler imaging with normal direction of blood flow towards the liver. Other: None. IMPRESSION: No sonographic etiology for abdominal pain is identified. If persistent clinical concern, recommend dedicated cross-sectional imaging. Electronically Signed   By: Meda Klinefelter M.D.   On: 11/22/2023 11:26   DG Chest 2 View Result Date: 11/22/2023 CLINICAL DATA:  Shortness of breath EXAM: CHEST - 2 VIEW COMPARISON:  X-ray 01/06/2022. FINDINGS: No pneumothorax, effusion or consolidation. Normal cardiopericardial silhouette. Tortuous ectatic aorta. Degenerative changes along the spine. Mild lung base opacities are identified bilaterally. Atelectasis versus infiltrate is possible. Recommend short follow-up. Degenerative changes of the spine. IMPRESSION: Patchy bandlike lung base opacities. Atelectasis versus infiltrate. Recommend follow-up. Electronically Signed   By: Karen Kays M.D.   On: 11/22/2023 09:44     Time coordinating discharge: Over 30 minutes    Lewie Chamber, MD  [facility] 11/27/2023, 12:58 PM

## 2023-11-27 NOTE — Progress Notes (Signed)
 IV removed, dressed, belongings packed, transferred to front entrance ambulating, instructions reviewed with understanding verbalized.

## 2023-11-27 NOTE — Evaluation (Signed)
 Physical Therapy Evaluation Patient Details Name: [name] Nolan MRN: [id number] DOB: 11-Jun-1946 Today's Date: 11/27/2023  History of Present Illness  78 yo female who presented to [facility] with worsening symptoms of diarrhea, weakness, N/V, coughing, and generalized aches. Dx of PNA. Pt with PMH migraines, anxiety/depression, HLD, hypothyroidism.  Clinical Impression  Pt is mobilizing well at a modified independent level. She ambulated 120' with RW, no loss of balance, no dyspnea, SpO2 94% on room air. She is ready to DC home from a PT standpoint. No further PT indicated, will sign off.         If plan is discharge home, recommend the following:     Can travel by private vehicle        Equipment Recommendations None recommended by PT  Recommendations for Other Services       Functional Status Assessment Patient has not had a recent decline in their functional status     Precautions / Restrictions Precautions Precautions: None Restrictions Weight Bearing Restrictions Per Provider Order: No      Mobility  Bed Mobility               General bed mobility comments: up in recliner    Transfers Overall transfer level: Independent Equipment used: None                    Ambulation/Gait Ambulation/Gait assistance: Modified independent (Device/Increase time) Gait Distance (Feet): 120 Feet Assistive device: Rolling walker (2 wheels) Gait Pattern/deviations: WFL(Within Functional Limits) Gait velocity: WFL     General Gait Details: no loss of balance, SpO2 94% on room air, no dyspnea  Stairs            Wheelchair Mobility     Tilt Bed    Modified Rankin (Stroke Patients Only)       Balance Overall balance assessment: Modified Independent                                           Pertinent Vitals/Pain Pain Assessment Pain Assessment: No/denies pain    Home Living Family/patient expects to be discharged to:: Private  residence Living Arrangements: Alone   Type of Home: House Home Access: Stairs to enter Entrance Stairs-Rails: Can reach both;Left;Right Entrance Stairs-Number of Steps: 6   Home Layout: One level Home Equipment: Rollator (4 wheels) Additional Comments: neighbors can assist prn; daughter is also nearby    Prior Function Prior Level of Function : Independent/Modified Independent;Driving             Mobility Comments: walks without AD, no falls in past 6 months ADLs Comments: independent     Extremity/Trunk Assessment   Upper Extremity Assessment Upper Extremity Assessment: Overall WFL for tasks assessed    Lower Extremity Assessment Lower Extremity Assessment: Overall WFL for tasks assessed    Cervical / Trunk Assessment Cervical / Trunk Assessment: Normal  Communication   Communication Communication: (P) No apparent difficulties    Cognition Arousal: (P) Alert Behavior During Therapy: (P) WFL for tasks assessed/performed   PT - Cognitive impairments: (P) No apparent impairments                                 Cueing       General Comments      Exercises     Assessment/Plan  PT Assessment Patient does not need any further PT services  PT Problem List         PT Treatment Interventions      PT Goals (Current goals can be found in the Care Plan section)  Acute Rehab PT Goals Patient Stated Goal: get home to her 2 dogs PT Goal Formulation: All assessment and education complete, DC therapy    Frequency       Co-evaluation               AM-PAC PT "6 Clicks" Mobility  Outcome Measure Help needed turning from your back to your side while in a flat bed without using bedrails?: None Help needed moving from lying on your back to sitting on the side of a flat bed without using bedrails?: None Help needed moving to and from a bed to a chair (including a wheelchair)?: None Help needed standing up from a chair using your arms  (e.g., wheelchair or bedside chair)?: None Help needed to walk in hospital room?: None Help needed climbing 3-5 steps with a railing? : None 6 Click Score: 24    End of Session Equipment Utilized During Treatment: Gait belt Activity Tolerance: Patient tolerated treatment well Patient left: in chair;with call bell/phone within reach Nurse Communication: Mobility status      Time: 0902-0920 PT Time Calculation (min) (ACUTE ONLY): 18 min   Charges:   PT Evaluation $PT Eval Low Complexity: 1 Low   PT General Charges $$ ACUTE PT VISIT: 1 Visit         Tamala Ser PT 11/27/2023  Acute Rehabilitation Services  Office [phone]

## 2023-11-27 NOTE — Plan of Care (Signed)
   Problem: Education: Goal: Knowledge of General Education information will improve Description Including pain rating scale, medication(s)/side effects and non-pharmacologic comfort measures Outcome: Progressing   Problem: Health Behavior/Discharge Planning: Goal: Ability to manage health-related needs will improve Outcome: Progressing

## 2023-11-27 NOTE — Plan of Care (Signed)

## 2023-11-30 ENCOUNTER — Telehealth: Payer: Self-pay

## 2023-11-30 NOTE — Transitions of Care (Post Inpatient/ED Visit) (Signed)
   11/30/2023  Name: [name] Nolan MRN: [id number] DOB: 1945/11/10  Today's TOC FU Call Status: Today's TOC FU Call Status:: Unsuccessful Call (1st Attempt) Unsuccessful Call (1st Attempt) Date: 11/30/23  Attempted to reach the patient regarding the most recent Inpatient/ED visit.  Follow Up Plan: Additional outreach attempts will be made to reach the patient to complete the Transitions of Care (Post Inpatient/ED visit) call.   Hilbert Odor RN, CCM [place]  VBCI-Population Health RN Care Manager [phone]

## 2023-11-30 NOTE — Transitions of Care (Post Inpatient/ED Visit) (Signed)
 11/30/2023  Name: [name] Nolan MRN: [id number] DOB: [birth year]  Today's TOC FU Call Status: Today's TOC FU Call Status:: Successful TOC FU Call Completed TOC FU Call Complete Date: 11/27/23 Patient's Name and Date of Birth confirmed.  Transition Care Management Follow-up Telephone Call How have you been since you were released from the hospital?: Better Any questions or concerns?: No (patient denies)  Items Reviewed: Did you receive and understand the discharge instructions provided?: Yes Medications obtained,verified, and reconciled?: Yes (Medications Reviewed) Any new allergies since your discharge?: No Dietary orders reviewed?: NA (General diet) Do you have support at home?: Yes People in Home: friend(s) Name of Support/Comfort Primary Source: patient states her neighbor helps as needed and helps with her dogs  Medications Reviewed Today: Medications Reviewed Today     Reviewed by Jessy Oto, RN (Registered Nurse) on 11/30/23 at 1219  Med List Status: <None>   Medication Order Taking? Sig Documenting Provider Last Dose Status Informant  albuterol (VENTOLIN HFA) 108 (90 Base) MCG/ACT inhaler [id number] Yes Inhale 1-2 puffs into the lungs every 6 (six) hours as needed for wheezing or shortness of breath. Prosperi, Christian H, PA-C Taking Active Self, Pharmacy Records           Med Note Sherlon Handing, [place] D   Wed Nov 25, 2023  1:32 PM) unknown  ALPRAZolam Prudy Feeler) 0.5 MG tablet [id number] Yes Take 1 tablet (0.5 mg total) by mouth at bedtime as needed for anxiety. Early, Sung Amabile, NP Taking Active Self, Pharmacy Records           Med Note Sharilyn Sites, ASHA   Thu May 21, 2023  1:56 PM) As needed. Last dose months ago.  cyclobenzaprine (FLEXERIL) 5 MG tablet [id number] Yes Take 1 tablet (5 mg total) by mouth 3 (three) times daily as needed for muscle spasms. Tollie Eth, NP Taking Active Self, Pharmacy Records           Med Note Sherlon Handing, [facility] D   Wed Nov 25, 2023   1:34 PM) unknown  gabapentin (NEURONTIN) 300 MG capsule [id number] Yes Take 4 capsules (1,200 mg total) by mouth at bedtime. Tollie Eth, NP Taking Active Self, Pharmacy Records  levothyroxine (SYNTHROID) 50 MCG tablet [id number] Yes Take 1 tablet (50 mcg total) by mouth daily. Except Saturday and Sunday, take 2 tabs Early, Sung Amabile, NP Taking Active Self, Pharmacy Records  nystatin cream (MYCOSTATIN) [id number] Yes APPLY TO AFFECTED AREA TWICE A DAY UNTIL RASH IS CLEAR  Patient taking differently: Apply 1 Application topically 2 (two) times daily.   Tollie Eth, NP Taking Active Self, Pharmacy Records  ondansetron (ZOFRAN-ODT) 4 MG disintegrating tablet [id number] Yes Take 1 tablet (4 mg total) by mouth every 6 (six) hours as needed for nausea or vomiting. Prosperi, Christian H, PA-C Taking Active Self, Pharmacy Records  venlafaxine XR (EFFEXOR-XR) 75 MG 24 hr capsule [id number] Yes Take 2 capsules (150 mg total) by mouth daily with breakfast. Early, Sung Amabile, NP Taking Active Self, Pharmacy Records            Home Care and Equipment/Supplies: Were Home Health Services Ordered?: No Any new equipment or medical supplies ordered?: No  Functional Questionnaire: Do you need assistance with bathing/showering or dressing?: No Do you need assistance with meal preparation?: No Do you need assistance with eating?: No Do you need assistance with getting out of bed/getting out of a chair/moving?: No Do you have difficulty managing or taking your medications?: No  Follow up appointments  reviewed: PCP Follow-up appointment confirmed?: Yes Date of PCP follow-up appointment?: 12/17/23 Follow-up Provider: Tollie Eth, NP Specialist Hospital Follow-up appointment confirmed?: NA Do you need transportation to your follow-up appointment?: No Do you understand care options if your condition(s) worsen?: Yes-patient verbalized understanding  SDOH Interventions Today    Flowsheet Row Most Recent Value   SDOH Interventions   Food Insecurity Interventions Intervention Not Indicated  Transportation Interventions Intervention Not Indicated  Utilities Interventions Intervention Not Indicated       Goals Addressed               This Visit's Progress     TOC Care Plan - Patient will report no readmissions in the next 30 days (pt-stated)        Current Barriers:  Patient unaware of newly prescribed antibiotics until [facility] RN review - (patient had prescriptions in a bag in her home/had not taken until [facility] RN call)  RNCM Clinical Goal(s):  Patient will work with the Care Management team over the next 30 days to address Transition of Care Barriers: Medication Management Patient will take all medications exactly as prescribed and will call provider for medication related questions as evidenced by patient report and health record Patient will demonstrate understanding of rationale for each prescribed medication as evidenced by patient report with reconciliation  Patient will attend all scheduled medical appointments: PCP appointment scheduled 12/17/23 (patient will call them to let them know it's for hospital follow up)  Interventions: Evaluation of current treatment plan related to  self management and patient's adherence to plan as established by provider  Transitions of Care:  New goal. Doctor Visits  - discussed the importance of doctor visits  Patient Goals/Self-Care Activities: Participate in Transition of Care Program/Attend [facility] scheduled calls Notify RN Care Manager of [facility] call rescheduling needs Take all medications as prescribed - Patient found bag with antibiotics during medication reconciliation and was taking them today Attend all scheduled provider appointments - patient will call PCP office to ask if they want to see her sooner than previously scheduled or make the 12/17/23 appointment a hospital follow up  Call pharmacy for medication refills 3-7 days in advance of running out of  medications Call MD or [facility] RN with any new or worsening symptoms  Follow Up Plan:  Telephone follow up appointment with care management team member scheduled for:  12/08/23 1pm with [facility] RN The patient has been provided with contact information for the care management team and has been advised to call with any health related questions or concerns.          Hilbert Odor RN, CCM [place]  VBCI-Population Health RN Care Manager [phone]

## 2023-12-01 ENCOUNTER — Telehealth: Payer: Self-pay | Admitting: Nurse Practitioner

## 2023-12-01 NOTE — Telephone Encounter (Signed)
 I spoke with patient to schedule her AWV.     She wanted me to let you know she wanted her 12/17/23 appointment to be a hospital follow up.   She stated [facility]

## 2023-12-08 ENCOUNTER — Telehealth: Payer: Self-pay

## 2023-12-08 ENCOUNTER — Other Ambulatory Visit: Payer: Self-pay

## 2023-12-08 ENCOUNTER — Other Ambulatory Visit

## 2023-12-08 NOTE — Patient Outreach (Signed)
 Care Management  Transitions of Care Program Transitions of Care Post-discharge week 2   12/08/2023 Name: [name] Nolan MRN: [id number] DOB: Jan 21, 1946  Subjective: [name] Nolan is a 78 y.o. year old female who is a primary care patient of Early, Sung Amabile, NP. The Care Management team Engaged with patient Engaged with patient by telephone to assess and address transitions of care needs.   Consent to Services:  Patient was given information about care management services, agreed to services, and gave verbal consent to participate.   Assessment: TOC Outreach completed. The patient is outside working in her garden and walking her dogs. She feels recovered from her Pneumonia. She likes to stay busy and doesn't like to take medication so she knew she was sick due to the hospitalization. She declines further TOC follow up calls. She feels she doesn't need them   SDOH Interventions    Flowsheet Row Telephone from 12/08/2023 in [place] POPULATION HEALTH DEPARTMENT Telephone from 11/30/2023 in [place] POPULATION HEALTH DEPARTMENT Clinical Support from 12/23/2022 in [facility]  SDOH Interventions     Food Insecurity Interventions Intervention Not Indicated Intervention Not Indicated Intervention Not Indicated  Housing Interventions Intervention Not Indicated -- Intervention Not Indicated  Transportation Interventions Intervention Not Indicated Intervention Not Indicated Intervention Not Indicated  Utilities Interventions Intervention Not Indicated Intervention Not Indicated Intervention Not Indicated  Alcohol Usage Interventions -- -- Intervention Not Indicated (Score <7)  Financial Strain Interventions -- -- Intervention Not Indicated  Physical Activity Interventions -- -- Intervention Not Indicated  Stress Interventions -- -- Intervention Not Indicated  Social Connections Interventions -- -- Intervention Not Indicated        Goals Addressed                This Visit's Progress     COMPLETED: TOC Care Plan - Patient will report no readmissions in the next 30 days (pt-stated)   On track     Current Barriers: Reviewed 12/08/23 Patient unaware of newly prescribed antibiotics until [facility] RN review - 11/30/23  RNCM Clinical Goal(s): Reviewed 12/08/23 Patient will work with the Care Management team over the next 30 days to address Transition of Care Barriers: Medication Management Patient will take all medications exactly as prescribed and will call provider for medication related questions as evidenced by patient report and health record Patient will demonstrate understanding of rationale for each prescribed medication as evidenced by patient report with reconciliation  Patient will attend all scheduled medical appointments: PCP appointment scheduled 12/17/23 (patient will call them to let them know it's for hospital follow up)  Interventions:Reviewed 12/08/23 Evaluation of current treatment plan related to  self management and patient's adherence to plan as established by provider  Transitions of Care: Goal Met Reviewed 12/08/23 Doctor Visits  - discussed the importance of doctor visits  Patient Goals/Self-Care Activities: Reviewed 12/08/23 Participate in Transition of Care Program/Attend [facility] scheduled calls Notify RN Care Manager of [facility] call rescheduling needs Take all medications as prescribed - Patient found bag with antibiotics during medication reconciliation and was taking them today Attend all scheduled provider appointments - patient will call PCP office to ask if they want to see her sooner than previously scheduled or make the 12/17/23 appointment a hospital follow up  Call pharmacy for medication refills 3-7 days in advance of running out of medications Call MD or [facility] RN with any new or worsening symptoms  Follow Up Plan:  The patient declines further [facility] Outreach calls  Plan: The patient has been provided with contact information  for the care management team and has been advised to call with any health related questions or concerns.   The patient has been provided with contact information for the care management team and has been advised to call with any health-related questions or concerns. The patient verbalized understanding with current POC. The patient is directed to their insurance card regarding availability of benefits coverage.  Deidre Ala, BSN, RN [place]  VBCI - [employer] Health RN Care Manager [phone]

## 2023-12-08 NOTE — Patient Outreach (Signed)
 Care Coordination   Follow Up Visit Note   12/08/2023 Name: [name] Nolan MRN: [id number] DOB: 31-Mar-1946  [name] Nolan is a 78 y.o. year old female who sees Early, Sung Amabile, NP for primary care. I spoke with  [name] Nolan by phone today.  What matters to the patients health and wellness today?  Call back regarding earlier contact    Goals Addressed   None     SDOH assessments and interventions completed:  Yes     Care Coordination Interventions:  Yes, provided   Follow up plan: No further intervention required.   Encounter Outcome:  Patient Visit Completed

## 2023-12-08 NOTE — Patient Instructions (Signed)
 Visit Information  Thank you for taking time to visit with me today. Please don't hesitate to contact me if I can be of assistance to you before our next scheduled telephone appointment.  Following is a copy of your care plan:   Goals Addressed               This Visit's Progress     COMPLETED: TOC Care Plan - Patient will report no readmissions in the next 30 days (pt-stated)   On track     Current Barriers: Reviewed 12/08/23 Patient unaware of newly prescribed antibiotics until [facility] RN review - 11/30/23  RNCM Clinical Goal(s): Reviewed 12/08/23 Patient will work with the Care Management team over the next 30 days to address Transition of Care Barriers: Medication Management Patient will take all medications exactly as prescribed and will call provider for medication related questions as evidenced by patient report and health record Patient will demonstrate understanding of rationale for each prescribed medication as evidenced by patient report with reconciliation  Patient will attend all scheduled medical appointments: PCP appointment scheduled 12/17/23 (patient will call them to let them know it's for hospital follow up)  Interventions:Reviewed 12/08/23 Evaluation of current treatment plan related to  self management and patient's adherence to plan as established by provider  Transitions of Care: Goal Met Reviewed 12/08/23 Doctor Visits  - discussed the importance of doctor visits  Patient Goals/Self-Care Activities: Reviewed 12/08/23 Participate in Transition of Care Program/Attend [facility] scheduled calls Notify RN Care Manager of [facility] call rescheduling needs Take all medications as prescribed - Patient found bag with antibiotics during medication reconciliation and was taking them today Attend all scheduled provider appointments - patient will call PCP office to ask if they want to see her sooner than previously scheduled or make the 12/17/23 appointment a hospital follow up  Call pharmacy for  medication refills 3-7 days in advance of running out of medications Call MD or [facility] RN with any new or worsening symptoms  Follow Up Plan:  The patient declines further [facility] Outreach calls        Patient verbalizes understanding of instructions and care plan provided today and agrees to view in [place]. Active MyChart status and patient understanding of how to access instructions and care plan via MyChart confirmed with patient.     The patient has been provided with contact information for the care management team and has been advised to call with any health related questions or concerns.   Please call the care guide team at [phone] if you need to cancel or reschedule your appointment.   Please call the Suicide and Crisis Lifeline: 988 call the [place] National Suicide Prevention Lifeline: 540-295-5749 or TTY: 860-021-7574 TTY 860-145-5421) to talk to a trained counselor if you are experiencing a Mental Health or Behavioral Health Crisis or need someone to talk to.  Deidre Ala, BSN, RN   VBCI - [employer] [facility] RN Care Manager [phone]

## 2023-12-08 NOTE — Patient Outreach (Signed)
error 

## 2023-12-17 ENCOUNTER — Ambulatory Visit: Admitting: Nurse Practitioner

## 2023-12-17 ENCOUNTER — Encounter: Payer: Self-pay | Admitting: Nurse Practitioner

## 2023-12-17 VITALS — BP 122/82 | HR 89 | Wt 190.6 lb

## 2023-12-17 DIAGNOSIS — Z6834 Body mass index (BMI) 34.0-34.9, adult: Secondary | ICD-10-CM | POA: Diagnosis not present

## 2023-12-17 DIAGNOSIS — E6609 Other obesity due to excess calories: Secondary | ICD-10-CM

## 2023-12-17 DIAGNOSIS — R748 Abnormal levels of other serum enzymes: Secondary | ICD-10-CM | POA: Insufficient documentation

## 2023-12-17 DIAGNOSIS — J209 Acute bronchitis, unspecified: Secondary | ICD-10-CM

## 2023-12-17 DIAGNOSIS — E876 Hypokalemia: Secondary | ICD-10-CM

## 2023-12-17 DIAGNOSIS — R7989 Other specified abnormal findings of blood chemistry: Secondary | ICD-10-CM | POA: Diagnosis not present

## 2023-12-17 DIAGNOSIS — E66811 Obesity, class 1: Secondary | ICD-10-CM

## 2023-12-17 DIAGNOSIS — Z09 Encounter for follow-up examination after completed treatment for conditions other than malignant neoplasm: Secondary | ICD-10-CM

## 2023-12-17 MED ORDER — ALBUTEROL SULFATE HFA 108 (90 BASE) MCG/ACT IN AERS: 1.0000 | INHALATION_SPRAY | Freq: Four times a day (QID) | RESPIRATORY_TRACT | 3 refills | Status: DC | PRN

## 2023-12-17 NOTE — Assessment & Plan Note (Signed)
 Repeat labs:

## 2023-12-17 NOTE — Assessment & Plan Note (Signed)
 Refill albuterol for ongoing use. Sample of triple therapy provided for use for 14 days to aid with continued recovery.

## 2023-12-17 NOTE — Progress Notes (Signed)
 [name], DNP, AGNP-c [facility] [address] [place], [place] [address] Main Office [phone]  ESTABLISHED PATIENT- Hospital Follow-Up Visit  Blood pressure 122/82, pulse 89, weight 190 lb 9.6 oz (86.5 kg), SpO2 95%.  HPI  [name] Nolan  is a 78 y.o. year old female presenting today for evaluation and management following hospitalization.   Date of Hospitalization: 11/24/2023-11/27/2023 Admitted: Yes Primary Diagnosis: CAP New Medications: No D/C'd Chronic Medications: No New Providers: No Speciality F/U Scheduled: No Lab/Imaging Concerns that need further evaluation: Yes: Potassium, creatinine, Liver function all need repeated due to abnormal results during hospitalization.   History of Present Illness [name] Nolan is a 78 year old female who presents with persistent symptoms and fatigue following a recent hospitalization for double pneumonia.  She experiences persistent fatigue, a lingering cough, and congestion following her recent hospitalization for double pneumonia affecting both lungs. Her energy levels have not fully returned, and she experiences increased fatigue after physical activity, such as walking her dogs. She continues to take Mucinex at night to manage her cough and aid sleep, as the cough tends to worsen in the evening and disrupts her sleep if not managed with medication.  She recalls being initially diagnosed with pneumonia at a clinic and was prescribed amoxicillin and erythromycin. Despite this, her condition worsened, leading to hospitalization. She describes an episode where she was too weak to shower without assistance, prompting her to call an ambulance for further care.  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 She mentions consuming more fruit and juice, particularly cranberry juice, to prevent UTIs.  She has a history of stopping Wegovy  due to nausea and gastrointestinal side effects, including diarrhea and vomiting. She notes a weight loss to 184 pounds, down from 200 pounds, and states, 'I'm just going to be fat. 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 She is concerned about her weakened immune system, noting frequent illnesses and a history of dental issues requiring multiple appointments.    ROS All ROS negative with exception of what is listed in HPI  PHYSICAL EXAM Physical Exam Vitals and nursing note reviewed.  Constitutional:      General: She is in acute distress.     Appearance: Normal appearance. She is not ill-appearing.  HENT:     Head: Normocephalic and atraumatic.  Eyes:     Conjunctiva/sclera: Conjunctivae normal.  Neck:     Vascular: No carotid bruit.  Cardiovascular:     Rate and Rhythm: Normal rate and regular rhythm.     Pulses: Normal pulses.     Heart sounds: Normal heart sounds.  Pulmonary:     Effort: Pulmonary effort is normal.     Breath sounds: Normal breath sounds. No wheezing, rhonchi or rales.  Chest:     Chest wall: No tenderness.  Abdominal:     General: Bowel sounds are normal.     Palpations: Abdomen is soft.  Musculoskeletal:     Cervical back: Neck supple.     Right lower leg: No edema.     Left lower leg: No edema.  Lymphadenopathy:     Cervical: No cervical adenopathy.  Skin:    General: Skin is warm and dry.     Capillary Refill: Capillary refill takes less than 2 seconds.  Neurological:     Mental Status: She is alert and oriented to person, place, and time.  Psychiatric:        Attention and Perception: Attention normal.        Mood and Affect: Mood normal.        Speech: Speech normal.      ASSESSMENT & PLAN Problem List Items Addressed This Visit     Class 1 obesity due to excess calories with serious comorbidity and  body mass index (BMI) of 34.0 to 34.9 in adult   Discontinued Wegovy due to nausea and gastrointestinal side effects despite some weight loss. Prefers non-pharmacological weight management due to adverse effects experienced.       Bronchitis with bronchospasm   Refill albuterol for ongoing use. Sample of triple therapy provided for use for 14 days to aid with continued recovery.       Relevant Medications   albuterol (VENTOLIN HFA) 108 (90 Base) MCG/ACT inhaler   Hypokalemia   Repeat labs.      Relevant Orders   Comprehensive metabolic panel with GFR   CBC with Differential/Platelet   Hospital discharge follow-up - Primary   Diagnosed with bilateral pneumonia requiring hospitalization after initial treatment with amoxicillin and erythromycin. Currently experiencing post-pneumonia symptoms including fatigue, cough, and congestion. Lungs are clear without wheezing or crackles. Recovery may be prolonged due to deconditioning from hospitalization, with cough potentially persisting for several weeks. - Continue Mucinex at night for cough relief - Prescribe a maintenance inhaler to be used twice daily for two weeks - Refill albuterol inhaler - Order blood tests to recheck kidney and liver function, and potassium levels      Relevant Orders   Comprehensive metabolic panel with GFR   CBC with Differential/Platelet   Elevated liver enzymes   Repeat labs      Relevant Orders   Comprehensive metabolic panel with GFR   CBC with Differential/Platelet   Elevated serum creatinine   Repeat labs      Relevant Orders   Comprehensive metabolic panel with GFR   CBC with Differential/Platelet    FOLLOW-UP: PRN   SaraBeth Allice Garro, DNP, AGNP-c Time: 42 minutes, >50% spent counseling, care coordination, chart review, and documentation.

## 2023-12-17 NOTE — Patient Instructions (Signed)
 Keep working on your diet is well balanced and full of vegetables, proteins, and healthy fats. Rest as much as you can, if you get tired in the day you can take a nap and that is ok.   Keep taking your mucinex. This can be helpful to keep the cough away.   It will likely take you another 3-6 weeks to get back to normal, but you are doing very well.

## 2023-12-17 NOTE — Assessment & Plan Note (Signed)
 Discontinued Wegovy due to nausea and gastrointestinal side effects despite some weight loss. Prefers non-pharmacological weight management due to adverse effects experienced.

## 2023-12-17 NOTE — Assessment & Plan Note (Signed)
 Diagnosed with bilateral pneumonia requiring hospitalization after initial treatment with amoxicillin and erythromycin. Currently experiencing post-pneumonia symptoms including fatigue, cough, and congestion. Lungs are clear without wheezing or crackles. Recovery may be prolonged due to deconditioning from hospitalization, with cough potentially persisting for several weeks. - Continue Mucinex at night for cough relief - Prescribe a maintenance inhaler to be used twice daily for two weeks - Refill albuterol inhaler - Order blood tests to recheck kidney and liver function, and potassium levels

## 2023-12-18 LAB — COMPREHENSIVE METABOLIC PANEL WITH GFR
ALT: 13 IU/L (ref 0–32)
AST: 24 IU/L (ref 0–40)
Albumin: 4 g/dL (ref 3.8–4.8)
Alkaline Phosphatase: 91 IU/L (ref 44–121)
BUN/Creatinine Ratio: 11 — ABNORMAL LOW (ref 12–28)
BUN: 10 mg/dL (ref 8–27)
Bilirubin Total: 0.5 mg/dL (ref 0.0–1.2)
CO2: 23 mmol/L (ref 20–29)
Calcium: 9.8 mg/dL (ref 8.7–10.3)
Chloride: 104 mmol/L (ref 96–106)
Creatinine, Ser: 0.91 mg/dL (ref 0.57–1.00)
Globulin, Total: 2.4 g/dL (ref 1.5–4.5)
Glucose: 75 mg/dL (ref 70–99)
Potassium: 4.3 mmol/L (ref 3.5–5.2)
Sodium: 143 mmol/L (ref 134–144)
Total Protein: 6.4 g/dL (ref 6.0–8.5)
eGFR: 65 mL/min/{1.73_m2} (ref >59)

## 2023-12-18 LAB — CBC WITH DIFFERENTIAL/PLATELET
Basophils Absolute: 0.1 10*3/uL (ref 0.0–0.2)
Basos: 1 %
EOS (ABSOLUTE): 0.3 10*3/uL (ref 0.0–0.4)
Eos: 6 %
Hematocrit: 43.2 % (ref 34.0–46.6)
Hemoglobin: 14.3 g/dL (ref 11.1–15.9)
Immature Grans (Abs): 0 10*3/uL (ref 0.0–0.1)
Immature Granulocytes: 0 %
Lymphocytes Absolute: 1.3 10*3/uL (ref 0.7–3.1)
Lymphs: 24 %
MCH: 31.7 pg (ref 26.6–33.0)
MCHC: 33.1 g/dL (ref 31.5–35.7)
MCV: 96 fL (ref 79–97)
Monocytes Absolute: 0.5 10*3/uL (ref 0.1–0.9)
Monocytes: 9 %
Neutrophils Absolute: 3.2 10*3/uL (ref 1.4–7.0)
Neutrophils: 60 %
Platelets: 260 10*3/uL (ref 150–450)
RBC: 4.51 x10E6/uL (ref 3.77–5.28)
RDW: 14 % (ref 11.7–15.4)
WBC: 5.4 10*3/uL (ref 3.4–10.8)

## 2023-12-23 ENCOUNTER — Encounter: Payer: Self-pay | Admitting: Nurse Practitioner

## 2024-01-07 DIAGNOSIS — M545 Low back pain, unspecified: Secondary | ICD-10-CM | POA: Diagnosis not present

## 2024-01-14 DIAGNOSIS — M5416 Radiculopathy, lumbar region: Secondary | ICD-10-CM | POA: Diagnosis not present

## 2024-01-15 ENCOUNTER — Other Ambulatory Visit: Payer: Self-pay | Admitting: Neurology

## 2024-01-15 DIAGNOSIS — E782 Mixed hyperlipidemia: Secondary | ICD-10-CM

## 2024-01-15 DIAGNOSIS — I10 Essential (primary) hypertension: Secondary | ICD-10-CM

## 2024-01-15 DIAGNOSIS — E063 Autoimmune thyroiditis: Secondary | ICD-10-CM

## 2024-01-15 DIAGNOSIS — F418 Other specified anxiety disorders: Secondary | ICD-10-CM

## 2024-01-15 DIAGNOSIS — I700 Atherosclerosis of aorta: Secondary | ICD-10-CM

## 2024-01-24 ENCOUNTER — Other Ambulatory Visit: Payer: Self-pay | Admitting: Nurse Practitioner

## 2024-01-24 DIAGNOSIS — E063 Autoimmune thyroiditis: Secondary | ICD-10-CM

## 2024-02-01 ENCOUNTER — Ambulatory Visit: Payer: Medicare PPO | Admitting: Neurology

## 2024-02-03 ENCOUNTER — Ambulatory Visit: Admitting: Neurology

## 2024-02-03 DIAGNOSIS — G43719 Chronic migraine without aura, intractable, without status migrainosus: Secondary | ICD-10-CM | POA: Diagnosis not present

## 2024-02-03 MED ORDER — ONABOTULINUMTOXINA 200 UNITS IJ SOLR
155.0000 [IU] | Freq: Once | INTRAMUSCULAR | Status: AC
Start: 2024-02-03 — End: 2024-02-03
  Administered 2024-02-03: 155 [IU] via INTRAMUSCULAR

## 2024-02-03 NOTE — Progress Notes (Signed)
 Consent Form Botulism Toxin Injection For Chronic Migraine  02/03/2024: stable: Doing extremely well at least 70% improvement continues in migraine frequency.  11/02/2023: stable: Doing extremely well at least 70% improvement continues in migraine frequency.  08/04/2023 here for botox  injections, she declined rescheduling and would like botox  today despite having nausea and diarrhea. Patient with diarrhea, does not look comfortable, nauea for weeks, I jighly encouraged even demanded she go to the ED or see her primary care and she refused multiple times, explained the consequences including dehydration, hypotension, AKI, decreased oxygenation(she is breathing havily) and if she does not go to her pcp today she will end up in the hospital, I pleaded with her and her escort and declined I also told her if this is infectious she could be giving it to other peopl and she still declined advised her to call 911 for worsening.   05/05/2023: stable, doing well 02/03/2023: stable; stable: Doing extremely well at least 70% improvement continues in migraine frequency. She is not a Garment/textile technologist. Gave a medrol  dosepak in case she has a right-sided occipital nerve attack and we are not here to provide nerve block, keep it a drawer and start right away 11/11/2022: stable  08/19/2022: stable: Doing extremely well at least 70% improvement continues in migraine frequency. She is not a Garment/textile technologist. Gave a medrol  dosepak in case she has a right-sided occipital nerve attack and we are not here to provide nerve block, keep it a drawer and start right away  05/27/2022: stable, doing well 03/02/2022: Stable but under lots of stress 12/07/2021: Stable 09/03/2021 stable: Doing extremely well at least 70% improvement continues in migraine frequency. She is not a Garment/textile technologist.   Reviewed orally with patient, additionally signature is on file:  Botulism toxin has been approved by the Federal drug administration for treatment of chronic  migraine. Botulism toxin does not cure chronic migraine and it may not be effective in some patients.  The administration of botulism toxin is accomplished by injecting a small amount of toxin into the muscles of the neck and head. Dosage must be titrated for each individual. Any benefits resulting from botulism toxin tend to wear off after 3 months with a repeat injection required if benefit is to be maintained. Injections are usually done every 3-4 months with maximum effect peak achieved by about 2 or 3 weeks. Botulism toxin is expensive and you should be sure of what costs you will incur resulting from the injection.  The side effects of botulism toxin use for chronic migraine may include:   -Transient, and usually mild, facial weakness with facial injections  -Transient, and usually mild, head or neck weakness with head/neck injections  -Reduction or loss of forehead facial animation due to forehead muscle weakness  -Eyelid drooping  -Dry eye  -Pain at the site of injection or bruising at the site of injection  -Double vision  -Potential unknown long term risks  Contraindications: You should not have Botox  if you are pregnant, nursing, allergic to albumin, have an infection, skin condition, or muscle weakness at the site of the injection, or have myasthenia gravis, Lambert-Eaton syndrome, or ALS.  It is also possible that as with any injection, there may be an allergic reaction or no effect from the medication. Reduced effectiveness after repeated injections is sometimes seen and rarely infection at the injection site may occur. All care will be taken to prevent these side effects. If therapy is given over a long time, atrophy and wasting in the  muscle injected may occur. Occasionally the patient's become refractory to treatment because they develop antibodies to the toxin. In this event, therapy needs to be modified.  I have read the above information and consent to the administration of  botulism toxin.    BOTOX  PROCEDURE NOTE FOR MIGRAINE HEADACHE    Contraindications and precautions discussed with patient(above). Aseptic procedure was observed and patient tolerated procedure. Procedure performed by Dr. Criselda Dolly  The condition has existed for more than 6 months, and pt does not have a diagnosis of ALS, Myasthenia Gravis or Lambert-Eaton Syndrome.  Risks and benefits of injections discussed and pt agrees to proceed with the procedure.  Written consent obtained  These injections are medically necessary. Pt  receives good benefits from these injections. These injections do not cause sedations or hallucinations which the oral therapies may cause.  Description of procedure:  The patient was placed in a sitting position. The standard protocol was used for Botox  as follows, with 5 units of Botox  injected at each site:   -Procerus muscle, midline injection  -Corrugator muscle, bilateral injection  -Frontalis muscle, bilateral injection, with 2 sites each side, medial injection was performed in the upper one third of the frontalis muscle, in the region vertical from the medial inferior edge of the superior orbital rim. The lateral injection was again in the upper one third of the forehead vertically above the lateral limbus of the cornea, 1.5 cm lateral to the medial injection site.  -Temporalis muscle injection, 4 sites, bilaterally. The first injection was 3 cm above the tragus of the ear, second injection site was 1.5 cm to 3 cm up from the first injection site in line with the tragus of the ear. The third injection site was 1.5-3 cm forward between the first 2 injection sites. The fourth injection site was 1.5 cm posterior to the second injection site.   -Occipitalis muscle injection, 3 sites, bilaterally. The first injection was done one half way between the occipital protuberance and the tip of the mastoid process behind the ear. The second injection site was done lateral  and superior to the first, 1 fingerbreadth from the first injection. The third injection site was 1 fingerbreadth superiorly and medially from the first injection site.  -Cervical paraspinal muscle injection, 2 sites, bilateral knee first injection site was 1 cm from the midline of the cervical spine, 3 cm inferior to the lower border of the occipital protuberance. The second injection site was 1.5 cm superiorly and laterally to the first injection site.  -Trapezius muscle injection was performed at 3 sites, bilaterally. The first injection site was in the upper trapezius muscle halfway between the inflection point of the neck, and the acromion. The second injection site was one half way between the acromion and the first injection site. The third injection was done between the first injection site and the inflection point of the neck.   Will return for repeat injection in 3 months.   155 units of Botox  was used, 45u Botox  not injected was wasted. The patient tolerated the procedure well, there were no complications of the above procedure.

## 2024-02-03 NOTE — Progress Notes (Signed)
 Botox - 200 units x 1 vial Lot: [id number] Expiration: 06/2026 NDC: 5409-8119-14  Bacteriostatic 0.9% Sodium Chloride - 4  mL  Lot: [id number] Expiration: 07/2024 NDC: 2130-8657-84   Dx: g43.719  B/B Witnessed by Sherrian Doheny

## 2024-02-16 ENCOUNTER — Ambulatory Visit (INDEPENDENT_AMBULATORY_CARE_PROVIDER_SITE_OTHER)

## 2024-02-16 VITALS — BP 148/98 | HR 76 | Temp 98.4°F | Ht 64.0 in | Wt 200.0 lb

## 2024-02-16 DIAGNOSIS — Z0000 Encounter for general adult medical examination without abnormal findings: Secondary | ICD-10-CM

## 2024-02-16 NOTE — Patient Instructions (Signed)
 Ms. Specht , Thank you for taking time out of your busy schedule to complete your Annual Wellness Visit with me. I enjoyed our conversation and look forward to speaking with you again next year. I, as well as your care team,  appreciate your ongoing commitment to your health goals. Please review the following plan we discussed and let me know if I can assist you in the future. Your Game plan/ To Do List    Referrals: If you haven't heard from the office you've been referred to, please reach out to them at the phone provided.  N/a Follow up Visits: Next Medicare AWV with our clinical staff: 02/21/2025 at 2:10   Have you seen your provider in the last 6 months (3 months if uncontrolled diabetes)? Yes Next Office Visit with your provider: 04/19/2024 at 3:30  Clinician Recommendations:  Aim for 30 minutes of exercise or brisk walking, 6-8 glasses of water, and 5 servings of fruits and vegetables each day.       This is a list of the screening recommended for you and due dates:  Health Maintenance  Topic Date Due   COVID-19 Vaccine (1 - 2024-25 season) Never done   Flu Shot  04/15/2024   DTaP/Tdap/Td vaccine (2 - Td or Tdap) 08/24/2024   Medicare Annual Wellness Visit  02/15/2025   Pneumonia Vaccine  Completed   DEXA scan (bone density measurement)  Completed   Hepatitis C Screening  Completed   Zoster (Shingles) Vaccine  Completed   HPV Vaccine  Aged Out   Meningitis B Vaccine  Aged Out   Colon Cancer Screening  Discontinued    Advanced directives: (In Chart) A copy of your advanced directives are scanned into your chart should your provider ever need it. Advance Care Planning is important because it:  [x]  Makes sure you receive the medical care that is consistent with your values, goals, and preferences  [x]  It provides guidance to your family and loved ones and reduces their decisional burden about whether or not they are making the right decisions based on your  wishes.  Follow the link provided in your after visit summary or read over the paperwork we have mailed to you to help you started getting your Advance Directives in place. If you need assistance in completing these, please reach out to us  so that we can help you!  See attachments for Preventive Care and Fall Prevention Tips.

## 2024-02-16 NOTE — Progress Notes (Signed)
 Subjective:   [name] Nolan is a 78 y.o. who presents for a Medicare Wellness preventive visit.  As a reminder, Annual Wellness Visits don't include a physical exam, and some assessments may be limited, especially if this visit is performed virtually. We may recommend an in-person follow-up visit with your provider if needed.  Visit Complete: In person    Persons Participating in Visit: Patient.  AWV Questionnaire: Yes: Patient Medicare AWV questionnaire was completed by the patient on 02/12/2024; I have confirmed that all information answered by patient is correct and no changes since this date.  Cardiac Risk Factors include: advanced age (>19men, >37 women);dyslipidemia     Objective:     Today's Vitals   02/16/24 1352  BP: (!) 148/98  Pulse: 76  Temp: 98.4 F (36.9 C)  TempSrc: Oral  SpO2: 95%  Weight: 200 lb (90.7 kg)  Height: 5\' 4"  (1.626 m)   Body mass index is 34.33 kg/m.     02/16/2024    1:58 PM 11/24/2023    2:07 PM 11/22/2023    9:08 AM 12/23/2022    3:17 PM 01/06/2022    6:14 PM 01/06/2022    7:11 AM 01/04/2022   12:47 PM  Advanced Directives  Does Patient Have a Medical Advance Directive? Yes No No Yes  No Yes  Type of Estate agent of [place];Living will   Living will;Healthcare Power of [employer] Power of [place];Living will  Does patient want to make changes to medical advance directive?    No - Patient declined     Copy of Healthcare Power of Attorney in Chart? Yes - validated most recent copy scanned in chart (See row information)   Yes - validated most recent copy scanned in chart (See row information)   No - copy requested  Would patient like information on creating a medical advance directive?  No - Patient declined No - Patient declined  No - Patient declined      Current Medications (verified) Outpatient Encounter Medications as of 02/16/2024  Medication Sig   ALPRAZolam  (XANAX ) 0.5 MG tablet Take 1 tablet  (0.5 mg total) by mouth at bedtime as needed for anxiety.   cyclobenzaprine  (FLEXERIL ) 5 MG tablet Take 1 tablet (5 mg total) by mouth 3 (three) times daily as needed for muscle spasms.   gabapentin  (NEURONTIN ) 300 MG capsule Take 4 capsules (1,200 mg total) by mouth at bedtime.   levothyroxine  (SYNTHROID ) 50 MCG tablet TAKE 1 TABLET BY MOUTH EVERY DAY   ondansetron  (ZOFRAN -ODT) 4 MG disintegrating tablet Take 1 tablet (4 mg total) by mouth every 6 (six) hours as needed for nausea or vomiting.   venlafaxine  XR (EFFEXOR -XR) 75 MG 24 hr capsule Take 2 capsules (150 mg total) by mouth daily with breakfast.   No facility-administered encounter medications on file as of 02/16/2024.    Allergies (verified) Statins and Codeine   History: Past Medical History:  Diagnosis Date   Absolute anemia 11/17/2017   Acute hyperglycemia 11/04/2021   Acute non-recurrent pansinusitis 09/26/2021   Allergy    Years ago   Anemia 09/03/2017   Anxiety and depression 05/07/2014   Widowed in 2013 after caring for her husband with Lewy Body Dementia for 6 years    Arthritis    Bacterial URI 09/17/2022   Basal cell carcinoma of right ear 02/26/2015   Removed by Dr Marlo Simpler   CAP (community acquired pneumonia) 11/24/2023   Cataract    Chronic back pain    "  mid-back; stops at the very lowest part of my back" (11/07/2015)   Dysphagia, pharyngoesophageal phase 12/13/2014   Dysuria 02/14/2022   Encounter to establish care 02/28/2021   Esophageal reflux    occ   Excessive daytime sleepiness 01/25/2015   Hashimoto's disease    Headache 01/06/2022   Heart murmur    History of colon polyps 09/03/2017   Hospital discharge follow-up 02/14/2022   Hot flashes 05/27/2016   Hyperlipidemia    Hypokalemia 11/04/2021   Hypothyroid    Insomnia    Joint pain    Low ferritin 08/27/2015   "took supplements for awhile" (11/07/2015)   Migraine    "under control w/daily RX right now" (11/07/2015)   Occipital neuralgia     Occipital neuralgia of right side 01/08/2022   Osteopenia 02/26/2015   Parainfluenza infection 11/04/2021   Pneumonia    PONV (postoperative nausea and vomiting) 1974   after cholecystectomy   Preventative health care 08/27/2015   Prolonged QT interval 11/04/2021   Skin cancer 02/26/2015   Right ear Removed by Dr Marlo Simpler   Subacute cough 11/18/2021   Vitamin B12 deficiency 09/01/2016   Vitamin D  deficiency 03/02/2017   Yeast dermatitis 07/17/2020   Past Surgical History:  Procedure Laterality Date   APPENDECTOMY  11/07/2015   BASAL CELL CARCINOMA EXCISION Right 02/26/2015   ear   CATARACT EXTRACTION Bilateral    CHOLECYSTECTOMY OPEN  09/15/1972   COLONOSCOPY  09/15/2004   EYE SURGERY     LAPAROSCOPIC APPENDECTOMY N/A 11/07/2015   Procedure: APPENDECTOMY LAPAROSCOPIC;  Surgeon: Dorena Gander, MD;  Location: [facility] OR;  Service: General;  Laterality: N/A;   LAPAROSCOPIC INCISIONAL / UMBILICAL / VENTRAL HERNIA REPAIR  11/07/2015   UHR   LUMBAR LAMINECTOMY/DECOMPRESSION MICRODISCECTOMY N/A 03/21/2021   Procedure: Microlumbar decompression Lumbar four-five [place];  Surgeon: Orvan Blanch, MD;  Location: [facility] OR;  Service: Orthopedics;  Laterality: N/A;   SPINE SURGERY  2022   Dr Mildred All / [facility]   Tooth implant     at least 5 years ago per pt   TUBAL LIGATION  09/16/1971   UMBILICAL HERNIA REPAIR N/A 11/07/2015   Procedure: LAPAROSCOPIC UMBILICAL HERNIA;  Surgeon: Dorena Gander, MD;  Location: [facility] OR;  Service: General;  Laterality: N/A;   Family History  Problem Relation Age of Onset   Congestive Heart Failure Mother    Hypertension Mother    Hyperlipidemia Mother    Heart disease Mother    Obesity Mother    Arthritis Mother    Varicose Veins Mother    Leukemia Father    Obesity Father    Cancer Father    Vision loss Father    Kidney disease Brother    Cancer Brother        stage 4 kidney cancer, metastatic   Kidney cancer Brother    Vision loss Brother    Leukemia  Maternal Aunt    Congestive Heart Failure Maternal Grandmother    Arthritis Sister    Varicose Veins Sister    Cancer Brother    Cancer Sister    Early death Sister    Colon cancer Neg Hx    Social History   Socioeconomic History   Marital status: Widowed    Spouse name: Currie Douse "Athena Bland" Edwards   Number of children: 1   Years of education: HS   Highest education level: 12th grade  Occupational History   Occupation: Retired  Tobacco Use   Smoking status: Never   Smokeless tobacco: Never  Vaping Use   Vaping status: Never Used  Substance and Sexual Activity   Alcohol use: No   Drug use: No   Sexual activity: Not Currently    Birth control/protection: Post-menopausal    Comment: lives with husband, no dietary restrictions, avoids caffeine, bananas, dairy  Other Topics Concern   Not on file  Social History Narrative   Patient lives alone.   Patient has one child- her daughter. At this time they have a strained relationship due to her feelings about her daughters husband.    Patient has a high school education.   Patient is right-handed.   Caffeine Use: Occasionally   Social Drivers of Corporate investment banker Strain: Low Risk  (02/12/2024)   Overall Financial Resource Strain (CARDIA)    Difficulty of Paying Living Expenses: Not hard at all  Food Insecurity: No Food Insecurity (02/12/2024)   Hunger Vital Sign    Worried About Running Out of Food in the Last Year: Never true    Ran Out of Food in the Last Year: Never true  Transportation Needs: No Transportation Needs (02/12/2024)   PRAPARE - Administrator, Civil Service (Medical): No    Lack of Transportation (Non-Medical): No  Physical Activity: Sufficiently Active (02/12/2024)   Exercise Vital Sign    Days of Exercise per Week: 6 days    Minutes of Exercise per Session: 30 min  Stress: No Stress Concern Present (02/12/2024)   [employer] of Occupational Health - Occupational Stress Questionnaire     Feeling of Stress : Not at all  Social Connections: Moderately Isolated (02/12/2024)   Social Connection and Isolation Panel [NHANES]    Frequency of Communication with Friends and Family: More than three times a week    Frequency of Social Gatherings with Friends and Family: More than three times a week    Attends Religious Services: 1 to 4 times per year    Active Member of [employer] or Organizations: No    Attends Banker Meetings: Never    Marital Status: Widowed    Tobacco Counseling Counseling given: Not Answered    Clinical Intake:  Pre-visit preparation completed: Yes  Pain : No/denies pain     Nutritional Status: BMI > 30  Obese Nutritional Risks: None Diabetes: No  Lab Results  Component Value Date   HGBA1C 5.5 05/21/2023   HGBA1C 5.4 01/12/2023   HGBA1C 5.0 01/07/2022     How often do you need to have someone help you when you read instructions, pamphlets, or other written materials from your doctor or pharmacy?: 1 - Never  Interpreter Needed?: No  Information entered by :: NAllen LPN   Activities of Daily Living     02/16/2024    1:53 PM 11/25/2023    1:00 AM  In your present state of health, do you have any difficulty performing the following activities:  Hearing? 1 0  Comment has hearing aids   Vision? 0 0  Difficulty concentrating or making decisions? 0 0  Walking or climbing stairs? 0   Dressing or bathing? 0   Doing errands, shopping? 0 0  Preparing Food and eating ? N   Using the Toilet? N   In the past six months, have you accidently leaked urine? N   Do you have problems with loss of bowel control? N   Managing your Medications? N   Managing your Finances? N   Housekeeping or managing your Housekeeping? N  Patient Care Team: Early, Adriane Albe, NP as PCP - General (Nurse Practitioner) Glory Larsen, MD as Consulting Physician (Neurology) [facility]  I have updated your Care Teams any recent  Medical Services you may have received from other providers in the past year.     Assessment:    This is a routine wellness examination for Keely.  Hearing/Vision screen Hearing Screening - Comments:: Has hearing aids that are maintained Vision Screening - Comments:: Regular eye exams, [facility]   Goals Addressed             This Visit's Progress    Patient Stated       02/16/2024, wants to lose weight       Depression Screen     02/16/2024    2:05 PM 12/17/2023    1:33 PM 02/17/2023    3:27 PM 12/23/2022    3:30 PM 02/28/2021    3:13 PM 01/31/2021    1:16 PM 02/28/2020    1:56 PM  PHQ 2/9 Scores  PHQ - 2 Score 0 0 0 0 2 1 1   PHQ- 9 Score 3    6 1      Fall Risk     02/16/2024    2:01 PM 12/17/2023    1:32 PM 02/17/2023    3:27 PM 12/23/2022    3:16 PM 12/19/2022    4:09 PM  Fall Risk   Falls in the past year? 1 0 0 0 0  Comment stepped on a rock      Number falls in past yr: 0 0 0 0 0  Injury with Fall? 1 0 0 0 0  Comment broke tooth      Risk for fall due to : Medication side effect No Fall Risks No Fall Risks No Fall Risks   Follow up Falls prevention discussed;Falls evaluation completed Falls evaluation completed Falls evaluation completed Falls prevention discussed;Education provided;Falls evaluation completed     MEDICARE RISK AT HOME:  Medicare Risk at Home Any stairs in or around the home?: Yes If so, are there any without handrails?: No Home free of loose throw rugs in walkways, pet beds, electrical cords, etc?: Yes Adequate lighting in your home to reduce risk of falls?: Yes Life alert?: No Use of a cane, walker or w/c?: No Grab bars in the bathroom?: Yes Shower chair or bench in shower?: Yes Elevated toilet seat or a handicapped toilet?: Yes  TIMED UP AND GO:  Was the test performed?  Yes  Length of time to ambulate 10 feet: 5 sec Gait steady and fast without use of assistive device  Cognitive Function: 6CIT completed    09/01/2016    2:58 PM   MMSE - Mini Mental State Exam  Orientation to time 5  Orientation to Place 5  Registration 3  Attention/ Calculation 5  Recall 3  Language- name 2 objects 2  Language- repeat 1  Language- follow 3 step command 3  Language- read & follow direction 1  Write a sentence 1  Copy design 1  Total score 30        02/16/2024    2:06 PM 12/23/2022    3:17 PM  6CIT Screen  What Year? 0 points 0 points  What month? 0 points 0 points  What time? 0 points 0 points  Count back from 20 0 points 0 points  Months in reverse 4 points 0 points  Repeat phrase 0 points 0 points  Total Score 4 points 0 points    Immunizations Immunization History  Administered Date(s) Administered   Fluad Quad(high Dose 65+) 06/22/2019   Fluad Trivalent(High Dose 65+) 05/21/2023   Influenza, High Dose Seasonal PF 06/24/2017, 06/24/2017, 07/27/2018   Influenza,inj,Quad PF,6+ Mos 05/29/2015   Influenza,inj,quad, With Preservative 06/22/2019   Influenza-Unspecified 05/16/2014, 04/24/2016, 06/20/2020   Pneumococcal Conjugate-13 09/04/2014   Pneumococcal Polysaccharide-23 08/27/2015, 07/07/2019   Pneumococcal-Unspecified 07/07/2019   RSV,unspecified 05/25/2023   Tdap 08/24/2014   Zoster Recombinant(Shingrix) 07/07/2019, 12/19/2019   Zoster, Live 08/28/2014    Screening Tests Health Maintenance  Topic Date Due   COVID-19 Vaccine (1 - 2024-25 season) Never done   INFLUENZA VACCINE  04/15/2024   DTaP/Tdap/Td (2 - Td or Tdap) 08/24/2024   Medicare Annual Wellness (AWV)  02/15/2025   Pneumonia Vaccine 75+ Years old  Completed   DEXA SCAN  Completed   Hepatitis C Screening  Completed   Zoster Vaccines- Shingrix  Completed   HPV VACCINES  Aged Out   Meningococcal B Vaccine  Aged Out   Colonoscopy  Discontinued    Health Maintenance  Health Maintenance Due  Topic Date Due   COVID-19 Vaccine (1 - 2024-25 season) Never done   Health Maintenance Items Addressed: Declines covid vaccines  Additional  Screening:  Vision Screening: Recommended annual ophthalmology exams for early detection of glaucoma and other disorders of the eye. Would you like a referral to an eye doctor? No    Dental Screening: Recommended annual dental exams for proper oral hygiene  Community Resource Referral / Chronic Care Management: CRR required this visit?  No   CCM required this visit?  No   Plan:    I have personally reviewed and noted the following in the patient's chart:   Medical and social history Use of alcohol, tobacco or illicit drugs  Current medications and supplements including opioid prescriptions. Patient is not currently taking opioid prescriptions. Functional ability and status Nutritional status Physical activity Advanced directives List of other physicians Hospitalizations, surgeries, and ER visits in previous 12 months Vitals Screenings to include cognitive, depression, and falls Referrals and appointments  In addition, I have reviewed and discussed with patient certain preventive protocols, quality metrics, and best practice recommendations. A written personalized care plan for preventive services as well as general preventive health recommendations were provided to patient.   Areatha Beecham, LPN   02/17/7845   After Visit Summary: (In Person-Printed) AVS printed and given to the patient  Notes: Nothing significant to report at this time.

## 2024-03-30 ENCOUNTER — Ambulatory Visit: Payer: Medicare PPO | Admitting: Neurology

## 2024-04-06 DIAGNOSIS — M25561 Pain in right knee: Secondary | ICD-10-CM | POA: Diagnosis not present

## 2024-04-06 DIAGNOSIS — M5416 Radiculopathy, lumbar region: Secondary | ICD-10-CM | POA: Diagnosis not present

## 2024-04-06 DIAGNOSIS — M25562 Pain in left knee: Secondary | ICD-10-CM | POA: Diagnosis not present

## 2024-04-19 ENCOUNTER — Ambulatory Visit: Admitting: Nurse Practitioner

## 2024-04-19 ENCOUNTER — Encounter: Payer: Self-pay | Admitting: Nurse Practitioner

## 2024-04-19 VITALS — BP 128/84 | HR 64 | Wt 205.8 lb

## 2024-04-19 DIAGNOSIS — I700 Atherosclerosis of aorta: Secondary | ICD-10-CM

## 2024-04-19 DIAGNOSIS — E559 Vitamin D deficiency, unspecified: Secondary | ICD-10-CM | POA: Diagnosis not present

## 2024-04-19 DIAGNOSIS — E063 Autoimmune thyroiditis: Secondary | ICD-10-CM

## 2024-04-19 DIAGNOSIS — F418 Other specified anxiety disorders: Secondary | ICD-10-CM | POA: Diagnosis not present

## 2024-04-19 DIAGNOSIS — I10 Essential (primary) hypertension: Secondary | ICD-10-CM

## 2024-04-19 DIAGNOSIS — M51369 Other intervertebral disc degeneration, lumbar region without mention of lumbar back pain or lower extremity pain: Secondary | ICD-10-CM

## 2024-04-19 DIAGNOSIS — B372 Candidiasis of skin and nail: Secondary | ICD-10-CM

## 2024-04-19 MED ORDER — LEVOTHYROXINE SODIUM 50 MCG PO TABS: 50.0000 ug | ORAL_TABLET | Freq: Every day | ORAL | 3 refills | Status: DC

## 2024-04-19 MED ORDER — GABAPENTIN 300 MG PO CAPS: 900.0000 mg | ORAL_CAPSULE | Freq: Every day | ORAL | 3 refills | Status: DC

## 2024-04-19 MED ORDER — FLUCONAZOLE 150 MG PO TABS: ORAL_TABLET | ORAL | 3 refills | Status: AC

## 2024-04-19 MED ORDER — VENLAFAXINE HCL ER 75 MG PO CP24: 75.0000 mg | ORAL_CAPSULE | Freq: Every day | ORAL | 3 refills | Status: DC

## 2024-04-19 MED ORDER — NYSTATIN 100000 UNIT/GM EX CREA: 1.0000 | TOPICAL_CREAM | Freq: Two times a day (BID) | CUTANEOUS | 3 refills | Status: AC

## 2024-04-19 NOTE — Patient Instructions (Addendum)
 I will watch for you labs to come in. I will let you know if there is anything that we need to change.    I sent in refills on your levothyroxine , gabapentin , venlafaxine , fluconazole , and nystatin  cream to [facility].   Your exam looked great today!!

## 2024-04-19 NOTE — Progress Notes (Unsigned)
 [name] Doing, DNP, AGNP-c [facility]  [address] [place], [place] [address] [phone]  ESTABLISHED PATIENT- Chronic Health and/or Follow-Up Visit  Blood pressure 128/84, pulse 64, weight 205 lb 12.8 oz (93.4 kg).    History of Present Illness [name] Nolan is a 78 year old female who presents for medication refills and management of chronic conditions.  She is seeking a refill for her yeast infection medication, specifically her cream and pills. She uses the medication every three days, which has improved her condition, but notes that the infection is not completely resolved.  She is currently taking Effexor  (venlafaxine ) at a dose of 75 mg once daily, having reduced from 150 mg as her symptoms have mellowed out. She also takes gabapentin  900 mg at night, Synthroid  (levothyroxine ) daily, and occasionally uses cyclobenzaprine  (Flexeril ) and alprazolam  as needed. She does not require refills for Zofran , and may have a few pills left.  She mentions staying indoors due to the heat, which she finds intolerable, and describes her physical activity as limited to yard work when the weather permits. She enjoys working in her yard and has been active in maintaining it, despite the challenges posed by the heat.  She lives independently and is actively involved in maintaining her yard. She has a history of [employer] and [employer], and she has experienced issues with [employer] not covering her expenses.   All ROS negative with exception of what is listed above.   PHYSICAL EXAM Physical Exam Vitals and nursing note reviewed.  Constitutional:      General: She is not in acute distress.    Appearance: Normal appearance.  HENT:     Head: Normocephalic.  Eyes:     Conjunctiva/sclera: Conjunctivae normal.     Pupils: Pupils are equal, round, and reactive to light.  Neck:     Vascular: No carotid bruit.  Cardiovascular:     Rate and Rhythm:  Normal rate and regular rhythm.     Pulses: Normal pulses.     Heart sounds: Normal heart sounds.  Pulmonary:     Effort: Pulmonary effort is normal.     Breath sounds: Normal breath sounds.  Abdominal:     Palpations: Abdomen is soft.  Musculoskeletal:     Right lower leg: No edema.     Left lower leg: No edema.  Skin:    General: Skin is warm and dry.     Capillary Refill: Capillary refill takes less than 2 seconds.  Neurological:     Mental Status: She is alert and oriented to person, place, and time.     Sensory: No sensory deficit.     Motor: No weakness.     Gait: Gait normal.  Psychiatric:        Mood and Affect: Mood normal.      PLAN Problem List Items Addressed This Visit     Depression with anxiety   Endorses improvement in mood with reduced need for medication. Venlafaxine  has been tapered to 75mg . She continues with alprazolam  PRN, but is not using this medication on a routine basis. Refills provided today.       Relevant Medications   venlafaxine  XR (EFFEXOR -XR) 75 MG 24 hr capsule   Yeast dermatitis   Chronic yeast infection requiring intermittent management. Symptoms have improved and she continues current treatment regimen.  - Prescribe fluconazole  for use every three days as needed along with nystatin  cream for management.       Relevant Medications  fluconazole  (DIFLUCAN ) 150 MG tablet   nystatin  cream (MYCOSTATIN )   Hypothyroidism due to Hashimoto's thyroiditis - Primary   Well-managed hypothyroidism with no reported symptoms. Continues on levothyroxine  therapy. - Repeat labs      Relevant Medications   venlafaxine  XR (EFFEXOR -XR) 75 MG 24 hr capsule   levothyroxine  (SYNTHROID ) 50 MCG tablet   Other Relevant Orders   Comprehensive metabolic panel with GFR   TSH   Degeneration of lumbar intervertebral disc   Long standing hx of back pain and spams. Gabapentin  900mg  at bedtime and PRN cyclobenzaprine  are effective to allow for normal ADL No  changes to plan of care at this time. Will continue to monitor.       Relevant Medications   gabapentin  (NEURONTIN ) 300 MG capsule   Aortic atherosclerosis (HCC)   Chronic. Strict blood pressure and lipid control for management recommended to reduce risks of heart attack and stroke. Will continue current medications.       Relevant Medications   venlafaxine  XR (EFFEXOR -XR) 75 MG 24 hr capsule   Other Relevant Orders   Hemoglobin A1c   CBC with Differential/Platelet   Comprehensive metabolic panel with GFR   Primary hypertension   Blood pressure well controlled with no alarm symptoms present at this time. Continue current management and monitoring.       Relevant Medications   venlafaxine  XR (EFFEXOR -XR) 75 MG 24 hr capsule   Other Relevant Orders   Hemoglobin A1c   CBC with Differential/Platelet   Comprehensive metabolic panel with GFR   Vitamin D  deficiency   Repeat labs.       Return in about 6 months (around 10/20/2024) for Med Management 30- later this week for labs.  [name] Jilda Kress, DNP, AGNP-c  Time: 43 minutes, >50% spent counseling, care coordination, chart review, and documentation.

## 2024-04-20 NOTE — Assessment & Plan Note (Signed)
 Blood pressure well controlled with no alarm symptoms present at this time. Continue current management and monitoring.

## 2024-04-20 NOTE — Assessment & Plan Note (Signed)
 Chronic yeast infection requiring intermittent management. Symptoms have improved and she continues current treatment regimen.  - Prescribe fluconazole  for use every three days as needed along with nystatin  cream for management.

## 2024-04-20 NOTE — Assessment & Plan Note (Signed)
 Repeat labs:

## 2024-04-20 NOTE — Assessment & Plan Note (Signed)
 Long standing hx of back pain and spams. Gabapentin  900mg  at bedtime and PRN cyclobenzaprine  are effective to allow for normal ADL No changes to plan of care at this time. Will continue to monitor.

## 2024-04-20 NOTE — Assessment & Plan Note (Signed)
 Well-managed hypothyroidism with no reported symptoms. Continues on levothyroxine  therapy. - Repeat labs

## 2024-04-20 NOTE — Assessment & Plan Note (Signed)
 Endorses improvement in mood with reduced need for medication. Venlafaxine  has been tapered to 75mg . She continues with alprazolam  PRN, but is not using this medication on a routine basis. Refills provided today.

## 2024-04-20 NOTE — Assessment & Plan Note (Signed)
 Chronic. Strict blood pressure and lipid control for management recommended to reduce risks of heart attack and stroke. Will continue current medications.

## 2024-04-25 ENCOUNTER — Other Ambulatory Visit

## 2024-04-25 DIAGNOSIS — I700 Atherosclerosis of aorta: Secondary | ICD-10-CM | POA: Diagnosis not present

## 2024-04-25 DIAGNOSIS — E063 Autoimmune thyroiditis: Secondary | ICD-10-CM | POA: Diagnosis not present

## 2024-04-25 DIAGNOSIS — I10 Essential (primary) hypertension: Secondary | ICD-10-CM | POA: Diagnosis not present

## 2024-04-26 ENCOUNTER — Telehealth: Payer: Self-pay | Admitting: Neurology

## 2024-04-26 ENCOUNTER — Ambulatory Visit: Payer: Self-pay | Admitting: Nurse Practitioner

## 2024-04-26 DIAGNOSIS — E063 Autoimmune thyroiditis: Secondary | ICD-10-CM

## 2024-04-26 DIAGNOSIS — N3090 Cystitis, unspecified without hematuria: Secondary | ICD-10-CM

## 2024-04-26 DIAGNOSIS — R7989 Other specified abnormal findings of blood chemistry: Secondary | ICD-10-CM

## 2024-04-26 LAB — COMPREHENSIVE METABOLIC PANEL WITH GFR
ALT: 13 IU/L (ref 0–32)
AST: 31 IU/L (ref 0–40)
Albumin: 4.4 g/dL (ref 3.8–4.8)
Alkaline Phosphatase: 104 IU/L (ref 44–121)
BUN/Creatinine Ratio: 10 — ABNORMAL LOW (ref 12–28)
BUN: 11 mg/dL (ref 8–27)
Bilirubin Total: 0.5 mg/dL (ref 0.0–1.2)
CO2: 22 mmol/L (ref 20–29)
Calcium: 9.8 mg/dL (ref 8.7–10.3)
Chloride: 103 mmol/L (ref 96–106)
Creatinine, Ser: 1.13 mg/dL — ABNORMAL HIGH (ref 0.57–1.00)
Globulin, Total: 2.5 g/dL (ref 1.5–4.5)
Glucose: 71 mg/dL (ref 70–99)
Potassium: 4.3 mmol/L (ref 3.5–5.2)
Sodium: 141 mmol/L (ref 134–144)
Total Protein: 6.9 g/dL (ref 6.0–8.5)
eGFR: 50 mL/min/1.73 — ABNORMAL LOW (ref >59)

## 2024-04-26 LAB — HEMOGLOBIN A1C
Est. average glucose Bld gHb Est-mCnc: 105 mg/dL
Hgb A1c MFr Bld: 5.3 % (ref 4.8–5.6)

## 2024-04-26 LAB — CBC WITH DIFFERENTIAL/PLATELET
Basophils Absolute: 0.1 x10E3/uL (ref 0.0–0.2)
Basos: 1 %
EOS (ABSOLUTE): 0.1 x10E3/uL (ref 0.0–0.4)
Eos: 2 %
Hematocrit: 43.2 % (ref 34.0–46.6)
Hemoglobin: 14 g/dL (ref 11.1–15.9)
Immature Grans (Abs): 0 x10E3/uL (ref 0.0–0.1)
Immature Granulocytes: 0 %
Lymphocytes Absolute: 1.2 x10E3/uL (ref 0.7–3.1)
Lymphs: 24 %
MCH: 30.4 pg (ref 26.6–33.0)
MCHC: 32.4 g/dL (ref 31.5–35.7)
MCV: 94 fL (ref 79–97)
Monocytes Absolute: 0.3 x10E3/uL (ref 0.1–0.9)
Monocytes: 6 %
Neutrophils Absolute: 3.3 x10E3/uL (ref 1.4–7.0)
Neutrophils: 67 %
Platelets: 250 x10E3/uL (ref 150–450)
RBC: 4.6 x10E6/uL (ref 3.77–5.28)
RDW: 13.4 % (ref 11.7–15.4)
WBC: 5 x10E3/uL (ref 3.4–10.8)

## 2024-04-26 LAB — TSH: TSH: 8.42 u[IU]/mL — ABNORMAL HIGH (ref 0.450–4.500)

## 2024-04-26 MED ORDER — SULFAMETHOXAZOLE-TRIMETHOPRIM 800-160 MG PO TABS: 1.0000 | ORAL_TABLET | Freq: Two times a day (BID) | ORAL | 0 refills | Status: AC

## 2024-04-26 NOTE — Telephone Encounter (Signed)
 Patient cancelled appointment due to will be out of town and will reschedule when come to appointment next appointment for Botox .

## 2024-04-27 ENCOUNTER — Other Ambulatory Visit: Payer: Self-pay | Admitting: Nurse Practitioner

## 2024-04-27 MED ORDER — LEVOTHYROXINE SODIUM 50 MCG PO TABS: ORAL_TABLET | ORAL | 3 refills | Status: AC

## 2024-04-27 NOTE — Addendum Note (Signed)
 Addended by: Sonia Stickels, CAMIE E on: 04/27/2024 12:54 PM   Modules accepted: Orders

## 2024-05-03 ENCOUNTER — Ambulatory Visit: Admitting: Neurology

## 2024-05-09 ENCOUNTER — Other Ambulatory Visit

## 2024-05-17 ENCOUNTER — Ambulatory Visit: Admitting: Neurology

## 2024-05-17 ENCOUNTER — Encounter: Payer: Self-pay | Admitting: Neurology

## 2024-05-17 VITALS — BP 134/90 | HR 70

## 2024-05-17 DIAGNOSIS — G43719 Chronic migraine without aura, intractable, without status migrainosus: Secondary | ICD-10-CM

## 2024-05-17 MED ORDER — KETOROLAC TROMETHAMINE 60 MG/2ML IM SOLN
30.0000 mg | Freq: Once | INTRAMUSCULAR | Status: AC
Start: 2024-05-17 — End: 2024-05-17
  Administered 2024-05-17: 30 mg via INTRAMUSCULAR

## 2024-05-17 MED ORDER — ONABOTULINUMTOXINA 200 UNITS IJ SOLR
155.0000 [IU] | Freq: Once | INTRAMUSCULAR | Status: AC
Start: 2024-05-17 — End: 2024-05-17
  Administered 2024-05-17: 155 [IU] via INTRAMUSCULAR

## 2024-05-17 NOTE — Progress Notes (Unsigned)
 Consent Form Botulism Toxin Injection For Chronic Migraine 05/17/2024: stable 02/03/2024: stable: Doing extremely well at least 70% improvement continues in migraine frequency.  11/02/2023: stable: Doing extremely well at least 70% improvement continues in migraine frequency.  08/04/2023 here for botox  injections, she declined rescheduling and would like botox  today despite having nausea and diarrhea. Patient with diarrhea, does not look comfortable, nauea for weeks, I jighly encouraged even demanded she go to the ED or see her primary care and she refused multiple times, explained the consequences including dehydration, hypotension, AKI, decreased oxygenation(she is breathing havily) and if she does not go to her pcp today she will end up in the hospital, I pleaded with her and her escort and declined I also told her if this is infectious she could be giving it to other peopl and she still declined advised her to call 911 for worsening.   05/05/2023: stable, doing well 02/03/2023: stable; stable: Doing extremely well at least 70% improvement continues in migraine frequency. She is not a Garment/textile technologist. Gave a medrol  dosepak in case she has a right-sided occipital nerve attack and we are not here to provide nerve block, keep it a drawer and start right away 11/11/2022: stable  08/19/2022: stable: Doing extremely well at least 70% improvement continues in migraine frequency. She is not a Garment/textile technologist. Gave a medrol  dosepak in case she has a right-sided occipital nerve attack and we are not here to provide nerve block, keep it a drawer and start right away  05/27/2022: stable, doing well 03/02/2022: Stable but under lots of stress 12/07/2021: Stable 09/03/2021 stable: Doing extremely well at least 70% improvement continues in migraine frequency. She is not a Garment/textile technologist.   Reviewed orally with patient, additionally signature is on file:  Botulism toxin has been approved by the Federal drug administration for treatment of  chronic migraine. Botulism toxin does not cure chronic migraine and it may not be effective in some patients.  The administration of botulism toxin is accomplished by injecting a small amount of toxin into the muscles of the neck and head. Dosage must be titrated for each individual. Any benefits resulting from botulism toxin tend to wear off after 3 months with a repeat injection required if benefit is to be maintained. Injections are usually done every 3-4 months with maximum effect peak achieved by about 2 or 3 weeks. Botulism toxin is expensive and you should be sure of what costs you will incur resulting from the injection.  The side effects of botulism toxin use for chronic migraine may include:   -Transient, and usually mild, facial weakness with facial injections  -Transient, and usually mild, head or neck weakness with head/neck injections  -Reduction or loss of forehead facial animation due to forehead muscle weakness  -Eyelid drooping  -Dry eye  -Pain at the site of injection or bruising at the site of injection  -Double vision  -Potential unknown long term risks  Contraindications: You should not have Botox  if you are pregnant, nursing, allergic to albumin, have an infection, skin condition, or muscle weakness at the site of the injection, or have myasthenia gravis, Lambert-Eaton syndrome, or ALS.  It is also possible that as with any injection, there may be an allergic reaction or no effect from the medication. Reduced effectiveness after repeated injections is sometimes seen and rarely infection at the injection site may occur. All care will be taken to prevent these side effects. If therapy is given over a long time, atrophy and wasting in  the muscle injected may occur. Occasionally the patient's become refractory to treatment because they develop antibodies to the toxin. In this event, therapy needs to be modified.  I have read the above information and consent to the  administration of botulism toxin.    BOTOX  PROCEDURE NOTE FOR MIGRAINE HEADACHE    Contraindications and precautions discussed with patient(above). Aseptic procedure was observed and patient tolerated procedure. Procedure performed by Dr. Andree Epp  The condition has existed for more than 6 months, and pt does not have a diagnosis of ALS, Myasthenia Gravis or Lambert-Eaton Syndrome.  Risks and benefits of injections discussed and pt agrees to proceed with the procedure.  Written consent obtained  These injections are medically necessary. Pt  receives good benefits from these injections. These injections do not cause sedations or hallucinations which the oral therapies may cause.  Description of procedure:  The patient was placed in a sitting position. The standard protocol was used for Botox  as follows, with 5 units of Botox  injected at each site:   -Procerus muscle, midline injection  -Corrugator muscle, bilateral injection  -Frontalis muscle, bilateral injection, with 2 sites each side, medial injection was performed in the upper one third of the frontalis muscle, in the region vertical from the medial inferior edge of the superior orbital rim. The lateral injection was again in the upper one third of the forehead vertically above the lateral limbus of the cornea, 1.5 cm lateral to the medial injection site.  -Temporalis muscle injection, 4 sites, bilaterally. The first injection was 3 cm above the tragus of the ear, second injection site was 1.5 cm to 3 cm up from the first injection site in line with the tragus of the ear. The third injection site was 1.5-3 cm forward between the first 2 injection sites. The fourth injection site was 1.5 cm posterior to the second injection site.   -Occipitalis muscle injection, 3 sites, bilaterally. The first injection was done one half way between the occipital protuberance and the tip of the mastoid process behind the ear. The second injection site  was done lateral and superior to the first, 1 fingerbreadth from the first injection. The third injection site was 1 fingerbreadth superiorly and medially from the first injection site.  -Cervical paraspinal muscle injection, 2 sites, bilateral knee first injection site was 1 cm from the midline of the cervical spine, 3 cm inferior to the lower border of the occipital protuberance. The second injection site was 1.5 cm superiorly and laterally to the first injection site.  -Trapezius muscle injection was performed at 3 sites, bilaterally. The first injection site was in the upper trapezius muscle halfway between the inflection point of the neck, and the acromion. The second injection site was one half way between the acromion and the first injection site. The third injection was done between the first injection site and the inflection point of the neck.   Will return for repeat injection in 3 months.   155 units of Botox  was used, 45u Botox  not injected was wasted. The patient tolerated the procedure well, there were no complications of the above procedure.

## 2024-05-17 NOTE — Progress Notes (Unsigned)
 Verbal order for toradol  30mg  IM for migraine per Dr. Ines.  Under aseptic technique toradol  30mg  IM to R deltoid.  Bandaid applied.  Tolerated well.  No allergies to toradol .   30mg  1ml waste observed by Heather MOTE RN.

## 2024-05-17 NOTE — Progress Notes (Unsigned)
 Botox - 200 units x 1 vial Lot: [id number] Expiration: 07/2026 NDC: 9976-6078-97   Bacteriostatic 0.9% Sodium Chloride - 4  mL  Lot: [id number] Expiration: 07/15/2025 NDC: 9590-8033-97    Dx: h56.280  B/B Witnessed by Heather MOTE RN

## 2024-05-26 DIAGNOSIS — M5417 Radiculopathy, lumbosacral region: Secondary | ICD-10-CM | POA: Diagnosis not present

## 2024-06-02 DIAGNOSIS — M545 Low back pain, unspecified: Secondary | ICD-10-CM | POA: Diagnosis not present

## 2024-07-04 ENCOUNTER — Other Ambulatory Visit

## 2024-07-04 DIAGNOSIS — E063 Autoimmune thyroiditis: Secondary | ICD-10-CM

## 2024-07-04 DIAGNOSIS — R7989 Other specified abnormal findings of blood chemistry: Secondary | ICD-10-CM

## 2024-07-05 LAB — COMPREHENSIVE METABOLIC PANEL WITH GFR
ALT: 8 IU/L (ref 0–32)
AST: 23 IU/L (ref 0–40)
Albumin: 4.3 g/dL (ref 3.8–4.8)
Alkaline Phosphatase: 96 IU/L (ref 49–135)
BUN/Creatinine Ratio: 13 (ref 12–28)
BUN: 13 mg/dL (ref 8–27)
Bilirubin Total: 0.4 mg/dL (ref 0.0–1.2)
CO2: 24 mmol/L (ref 20–29)
Calcium: 10.4 mg/dL — ABNORMAL HIGH (ref 8.7–10.3)
Chloride: 103 mmol/L (ref 96–106)
Creatinine, Ser: 1.03 mg/dL — ABNORMAL HIGH (ref 0.57–1.00)
Globulin, Total: 2.1 g/dL (ref 1.5–4.5)
Glucose: 67 mg/dL — ABNORMAL LOW (ref 70–99)
Potassium: 4.5 mmol/L (ref 3.5–5.2)
Sodium: 140 mmol/L (ref 134–144)
Total Protein: 6.4 g/dL (ref 6.0–8.5)
eGFR: 56 mL/min/1.73 — ABNORMAL LOW (ref >59)

## 2024-07-05 LAB — TSH+FREE T4
Free T4: 1.12 ng/dL (ref 0.82–1.77)
TSH: 1.45 u[IU]/mL (ref 0.450–4.500)

## 2024-07-08 ENCOUNTER — Ambulatory Visit: Payer: Self-pay | Admitting: Nurse Practitioner

## 2024-07-08 DIAGNOSIS — R7989 Other specified abnormal findings of blood chemistry: Secondary | ICD-10-CM

## 2024-07-20 ENCOUNTER — Telehealth: Payer: Self-pay | Admitting: Adult Health

## 2024-07-20 NOTE — Telephone Encounter (Signed)
 Submitted auth request via CMM to change pt to [facility], status is pending. Key: [id number]

## 2024-07-25 MED ORDER — ONABOTULINUMTOXINA 200 UNITS IJ SOLR: 155.0000 [IU] | INTRAMUSCULAR | 3 refills | Status: AC

## 2024-07-25 NOTE — Telephone Encounter (Signed)
 Received approval, please send rx to [facility].  Auth#: [id number] (09/16/23-09/14/25)

## 2024-07-25 NOTE — Addendum Note (Signed)
 Addended by: JOSHUA IZETTA CROME on: 07/25/2024 08:04 AM   Modules accepted: Orders

## 2024-07-28 ENCOUNTER — Other Ambulatory Visit: Payer: Self-pay | Admitting: Nurse Practitioner

## 2024-07-28 DIAGNOSIS — E063 Autoimmune thyroiditis: Secondary | ICD-10-CM

## 2024-07-28 DIAGNOSIS — F418 Other specified anxiety disorders: Secondary | ICD-10-CM

## 2024-07-28 DIAGNOSIS — I700 Atherosclerosis of aorta: Secondary | ICD-10-CM

## 2024-07-28 DIAGNOSIS — I10 Essential (primary) hypertension: Secondary | ICD-10-CM

## 2024-08-05 ENCOUNTER — Other Ambulatory Visit: Payer: Self-pay | Admitting: Nurse Practitioner

## 2024-08-05 DIAGNOSIS — M51369 Other intervertebral disc degeneration, lumbar region without mention of lumbar back pain or lower extremity pain: Secondary | ICD-10-CM

## 2024-08-08 NOTE — Telephone Encounter (Signed)
 [name] Nolan from [facility] called  to schedule Pt Botox  Delivery   Botox  Delivery  Appt 12-2 Delivery Date : 08-10-24 200 Units Quantity of 1

## 2024-08-16 ENCOUNTER — Encounter: Payer: Self-pay | Admitting: Adult Health

## 2024-08-16 ENCOUNTER — Ambulatory Visit: Admitting: Neurology

## 2024-08-16 ENCOUNTER — Ambulatory Visit: Admitting: Adult Health

## 2024-08-16 VITALS — BP 141/83 | HR 76

## 2024-08-16 DIAGNOSIS — G43719 Chronic migraine without aura, intractable, without status migrainosus: Secondary | ICD-10-CM

## 2024-08-16 MED ORDER — ONABOTULINUMTOXINA 200 UNITS IJ SOLR
155.0000 [IU] | Freq: Once | INTRAMUSCULAR | Status: AC
  Administered 2024-08-16: 155 [IU] via INTRAMUSCULAR

## 2024-08-16 NOTE — Telephone Encounter (Signed)
 I called pt and verified that she was still coming to today's appt. She will be here today and we r/s her March appt.

## 2024-08-16 NOTE — Progress Notes (Signed)
 Botox - 200 units x 1 vial Lot: [id number] Expiration: 3/28 NDC: 9976-6078-97  Bacteriostatic 0.9% Sodium Chloride - 4 mL  Lot: [id number] Expiration: 12/13/25 NDC: 9590-8033-97  Dx: H56.280 S/P  Witnessed by Stephania, CMA

## 2024-08-16 NOTE — Progress Notes (Signed)
 08/16/24: Previous patient of Dr. Ines. Here today for botox . First injections with me. Not really having a lot of headaches now.    BOTOX  PROCEDURE NOTE FOR MIGRAINE HEADACHE    Contraindications and precautions discussed with patient(above). Aseptic procedure was observed and patient tolerated procedure. Procedure performed by Duwaine Russell, NP  The condition has existed for more than 6 months, and pt does not have a diagnosis of ALS, Myasthenia Gravis or Lambert-Eaton Syndrome.  Risks and benefits of injections discussed and pt agrees to proceed with the procedure.  Written consent obtained  These injections are medically necessary. These injections do not cause sedations or hallucinations which the oral therapies may cause.  Indication/Diagnosis: chronic migraine BOTOX (G9414) injection was performed according to protocol by [facility]. 200 units of BOTOX  was dissolved into 4 cc NS.   NDC: [address]-98  Type of toxin: Botox   Botox - 200 units x 1 vial Lot: [id number] Expiration: 3/28 NDC: 9976-6078-97   Bacteriostatic 0.9% Sodium Chloride - 4 mL  Lot: [id number] Expiration: 12/13/25 NDC: 9590-8033-97   Dx: H56.280  Description of procedure:  The patient was placed in a sitting position. The standard protocol was used for Botox  as follows, with 5 units of Botox  injected at each site:   -Procerus muscle, midline injection  -Corrugator muscle, bilateral injection  -Frontalis muscle, bilateral injection, with 2 sites each side, medial injection was performed in the upper one third of the frontalis muscle, in the region vertical from the medial inferior edge of the superior orbital rim. The lateral injection was again in the upper one third of the forehead vertically above the lateral limbus of the cornea, 1.5 cm lateral to the medial injection site.  -Temporalis muscle injection, 4 sites, bilaterally. The first injection was 3 cm above the tragus of the ear, second injection  site was 1.5 cm to 3 cm up from the first injection site in line with the tragus of the ear. The third injection site was 1.5-3 cm forward between the first 2 injection sites. The fourth injection site was 1.5 cm posterior to the second injection site.  -Occipitalis muscle injection, 3 sites, bilaterally. The first injection was done one half way between the occipital protuberance and the tip of the mastoid process behind the ear. The second injection site was done lateral and superior to the first, 1 fingerbreadth from the first injection. The third injection site was 1 fingerbreadth superiorly and medially from the first injection site.  -Cervical paraspinal muscle injection, 2 sites, bilateral knee first injection site was 1 cm from the midline of the cervical spine, 3 cm inferior to the lower border of the occipital protuberance. The second injection site was 1.5 cm superiorly and laterally to the first injection site.  -Trapezius muscle injection was performed at 3 sites, bilaterally. The first injection site was in the upper trapezius muscle halfway between the inflection point of the neck, and the acromion. The second injection site was one half way between the acromion and the first injection site. The third injection was done between the first injection site and the inflection point of the neck.   Will return for repeat injection in 3 months.   A 200 unit sof Botox  was used, 155 units were injected, the rest of the Botox  was wasted. The patient tolerated the procedure well, there were no complications of the above procedure.  Duwaine Russell, MSN, NP-C 08/16/2024, 1:53 PM [facility] [address] [place], [place] [address] [phone]

## 2024-08-22 ENCOUNTER — Encounter: Payer: Self-pay | Admitting: Nurse Practitioner

## 2024-08-22 NOTE — Telephone Encounter (Signed)
 Appointment scheduled.

## 2024-08-23 ENCOUNTER — Ambulatory Visit: Admitting: Nurse Practitioner

## 2024-08-23 ENCOUNTER — Encounter: Payer: Self-pay | Admitting: Nurse Practitioner

## 2024-08-23 VITALS — BP 134/88 | HR 68 | Wt 206.6 lb

## 2024-08-23 DIAGNOSIS — E6609 Other obesity due to excess calories: Secondary | ICD-10-CM | POA: Diagnosis not present

## 2024-08-23 DIAGNOSIS — Z6834 Body mass index (BMI) 34.0-34.9, adult: Secondary | ICD-10-CM

## 2024-08-23 DIAGNOSIS — M950 Acquired deformity of nose: Secondary | ICD-10-CM | POA: Diagnosis not present

## 2024-08-23 DIAGNOSIS — E66811 Obesity, class 1: Secondary | ICD-10-CM | POA: Diagnosis not present

## 2024-08-23 DIAGNOSIS — E063 Autoimmune thyroiditis: Secondary | ICD-10-CM

## 2024-08-23 DIAGNOSIS — I10 Essential (primary) hypertension: Secondary | ICD-10-CM

## 2024-08-23 DIAGNOSIS — M51362 Other intervertebral disc degeneration, lumbar region with discogenic back pain and lower extremity pain: Secondary | ICD-10-CM

## 2024-08-23 DIAGNOSIS — F418 Other specified anxiety disorders: Secondary | ICD-10-CM

## 2024-08-23 DIAGNOSIS — F339 Major depressive disorder, recurrent, unspecified: Secondary | ICD-10-CM | POA: Diagnosis not present

## 2024-08-23 DIAGNOSIS — I700 Atherosclerosis of aorta: Secondary | ICD-10-CM

## 2024-08-23 MED ORDER — VENLAFAXINE HCL ER 75 MG PO CP24: 75.0000 mg | ORAL_CAPSULE | Freq: Every day | ORAL | 3 refills | Status: AC

## 2024-08-23 NOTE — Patient Instructions (Addendum)
 I have sent in the order for the ENT to let them evaluate your nose to see if there is anything they can do to help get the stuffiness improved.   Work on increasing the amount you eat during the day to see if this helps with your weight. I really think you just haven't been eating enough. WEIGHT LOSS PLANNING  For best management of weight, it is vital to balance intake versus output. This means the number of calories burned per day must be less than the calories you take in with food and drink.   I recommend trying to follow a diet with the following: Calories: At least 1500 calories per day Carbohydrates: 150-180 grams of carbohydrates per day  Why: Gives your body enough quick fuel for cells to maintain normal function without sending them into starvation mode.  Protein: At least 90 grams of protein per day- 30 grams with each meal Why: Protein takes longer and uses more energy than carbohydrates to break down for fuel. The carbohydrates in your meals serves as quick energy sources and proteins help use some of that extra quick energy to break down to produce long term energy. This helps you not feel hungry as quickly and protein breakdown burns calories.  Water: Drink AT LEAST 64 ounces of water per day  Why: Water is essential to healthy metabolism. Water helps to fill the stomach and keep you fuller longer. Water is required for healthy digestion and filtering of waste in the body.  Fat: Limit fats in your diet- when choosing fats, choose foods with lower fats content such as lean meats (chicken, fish, turkey).  Why: Increased fat intake leads to storage for later. Once you burn your carbohydrate energy, your body goes into fat and protein breakdown mode to help you loose weight.  Cholesterol: Fats and oils that are LIQUID at room temperature are best. Choose vegetable oils (olive oil, avocado oil, nuts). Avoid fats that are SOLID at room temperature (animal fats, processed meats).  Healthy fats are often found in whole grains, beans, nuts, seeds, and berries.  Why: Elevated cholesterol levels lead to build up of cholesterol on the inside of your blood vessels. This will eventually cause the blood vessels to become hard and can lead to high blood pressure and damage to your organs. When the blood flow is reduced, but the pressure is high from cholesterol buildup, parts of the cholesterol can break off and form clots that can go to the brain or heart leading to a stroke or heart attack.  Fiber: Increase amount of SOLUBLE the fiber in your diet. This helps to fill you up, lowers cholesterol, and helps with digestion. Some foods high in soluble fiber are oats, peas, beans, apples, carrots, barley, and citrus fruits.   Why: Fiber fills you up, helps remove excess cholesterol, and aids in healthy digestion which are all very important in weight management.   I recommend the following as a minimum activity routine: Purposeful walk or other physical activity at least 20 minutes every single day. This means purposefully taking a walk, jog, bike, swim, treadmill, elliptical, dance, etc.  This activity should be ABOVE your normal daily activities, such as walking at work. Goal exercise should be at least 150 minutes a week- work your way up to this.   Heart Rate: Your maximum exercise heart rate should be 220 - Your Age in Years. When exercising, get your heart rate up, but avoid going over the maximum targeted heart  rate.  60-70% of your maximum heart rate is where you tend to burn the most fat. To find this number:  220 - Age In Years= Max HR  Max HR x 0.6 (or 0.7) = Fat Burning HR The Fat Burning HR is your goal heart rate while working out to burn the most fat.  NEVER exercise to the point your feel lightheaded, weak, nauseated, dizzy. If you experience ANY of these symptoms- STOP exercise! Allow yourself to cool down and your heart rate to come down. Then restart slower next time.   If at ANY TIME you feel chest pain or chest pressure during exercise, STOP IMMEDIATELY and seek medical attention.

## 2024-08-23 NOTE — Progress Notes (Unsigned)
  [name] Nolan Doing, DNP, AGNP-c [facility] [address] [place], [place] [address] [phone]   ACUTE VISIT on 08/23/2024  Blood pressure 134/88, pulse 68, weight 206 lb 9.6 oz (93.7 kg).  Subjective:  HPI     ROS negative except for what is listed in HPI. History, Medications, Surgery, SDOH, and Family History reviewed and updated as appropriate.  Objective:  Physical Exam      Assessment & Plan:   Problem List Items Addressed This Visit   None  .apsecabridge    [name] Nolan Doing, DNP, AGNP-c Time: *** minutes, >50% spent counseling, care coordination, chart review, and documentation.

## 2024-08-24 ENCOUNTER — Encounter: Payer: Self-pay | Admitting: Nurse Practitioner

## 2024-08-24 NOTE — Assessment & Plan Note (Signed)
 Chronic lumbar radiculopathy post-back surgery with symptoms exacerbated by activity. Previous nerve block was ineffective and she is apprehensive about further invasive procedures. Currently managed with Aleve for inflammation. - Continue Aleve for inflammation management.

## 2024-08-24 NOTE — Assessment & Plan Note (Signed)
 Following a fall in April, resulting in left-sided nasal obstruction and swelling. No pain reported, but significant nasal congestion persists. Possible nasal polyp or septal deviation contributing to obstruction. No deformity noted externally. Left nare appears inflamed compared to the right.  - Referred to ENT for evaluation of nasal obstruction and potential polyp. Orders:   Ambulatory referral to ENT

## 2024-08-24 NOTE — Assessment & Plan Note (Signed)
 Difficulty losing weight despite dietary efforts. 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 Discussed potential for inadequate caloric intake leading to fat storage. - Provided high protein, low carb snack ideas. - Encouraged regular meals and snacks to prevent starvation mode. - Printed a five-day meal plan with specific nutritional goals.

## 2024-08-24 NOTE — Assessment & Plan Note (Signed)
 Managed with venlafaxine  without concerns present at this time. She reports that last refill was only for 30 tablets (insurance covers 100 tablets at a time). Will resend script to help ensure that her full amount is received.  Orders:   venlafaxine  XR (EFFEXOR -XR) 75 MG 24 hr capsule; Take 1 capsule (75 mg total) by mouth daily with breakfast.

## 2024-08-26 ENCOUNTER — Encounter (INDEPENDENT_AMBULATORY_CARE_PROVIDER_SITE_OTHER): Payer: Self-pay

## 2024-10-04 ENCOUNTER — Ambulatory Visit (INDEPENDENT_AMBULATORY_CARE_PROVIDER_SITE_OTHER): Admitting: Otolaryngology

## 2024-10-04 ENCOUNTER — Encounter (INDEPENDENT_AMBULATORY_CARE_PROVIDER_SITE_OTHER): Payer: Self-pay | Admitting: Otolaryngology

## 2024-10-04 VITALS — BP 152/93 | HR 75 | Ht 64.0 in | Wt 199.0 lb

## 2024-10-04 DIAGNOSIS — W19XXXA Unspecified fall, initial encounter: Secondary | ICD-10-CM | POA: Diagnosis not present

## 2024-10-04 DIAGNOSIS — J342 Deviated nasal septum: Secondary | ICD-10-CM

## 2024-10-04 DIAGNOSIS — G4489 Other headache syndrome: Secondary | ICD-10-CM

## 2024-10-04 DIAGNOSIS — J343 Hypertrophy of nasal turbinates: Secondary | ICD-10-CM

## 2024-10-04 MED ORDER — FLUTICASONE PROPIONATE 50 MCG/ACT NA SUSP: 2.0000 | Freq: Every day | NASAL | 6 refills | Status: AC

## 2024-10-04 NOTE — Progress Notes (Signed)
 Reason for Consult: Nasal obstruction Referring Physician: Dr. Early  [name] Nolan is an 79 y.o. female.  HPI: History of a fall back in April and hit her nose on a stone in her yard.  She had a lot of epistaxis.  She now is having left-sided nasal obstruction.  It bothers her with inability to breathe.  Sometimes there is congestion.  She feels like the external portion of her nose looks normal.  She does not have any further bleeding.  No facial pressure or pain.  No purulence.  Past Medical History:  Diagnosis Date   Absolute anemia 11/17/2017   Acute hyperglycemia 11/04/2021   Acute non-recurrent pansinusitis 09/26/2021   Allergy    Years ago   Anemia 09/03/2017   Anxiety and depression 05/07/2014   Widowed in 2013 after caring for her husband with Lewy Body Dementia for 6 years    Arthritis    Bacterial URI 09/17/2022   Basal cell carcinoma of right ear 02/26/2015   Removed by Dr Phill   Bronchitis with bronchospasm 11/03/2021   CAP (community acquired pneumonia) 11/24/2023   Cataract    Chronic back pain    mid-back; stops at the very lowest part of my back (11/07/2015)   Dysphagia, pharyngoesophageal phase 12/13/2014   Dysuria 02/14/2022   Encounter to establish care 02/28/2021   Esophageal reflux    occ   Excessive daytime sleepiness 01/25/2015   Hashimoto's disease    Headache 01/06/2022   Heart murmur    History of colon polyps 09/03/2017   Hospital discharge follow-up 02/14/2022   Hospital discharge follow-up 02/14/2022   Hot flashes 05/27/2016   Hyperlipidemia    Hypokalemia 11/04/2021   Hypothyroid    Insomnia    Joint pain    Low ferritin 08/27/2015   took supplements for awhile (11/07/2015)   Migraine    under control w/daily RX right now (11/07/2015)   Occipital neuralgia    Occipital neuralgia of right side 01/08/2022   Osteopenia 02/26/2015   Parainfluenza infection 11/04/2021   Pneumonia    PONV (postoperative nausea and vomiting)  1974   after cholecystectomy   Preventative health care 08/27/2015   Prolonged QT interval 11/04/2021   Skin cancer 02/26/2015   Right ear Removed by Dr Phill   Subacute cough 11/18/2021   Vitamin B12 deficiency 09/01/2016   Vitamin D  deficiency 03/02/2017   Yeast dermatitis 07/17/2020    Past Surgical History:  Procedure Laterality Date   APPENDECTOMY  11/07/2015   BASAL CELL CARCINOMA EXCISION Right 02/26/2015   ear   CATARACT EXTRACTION Bilateral    CHOLECYSTECTOMY OPEN  09/15/1972   COLONOSCOPY  09/15/2004   EYE SURGERY     LAPAROSCOPIC APPENDECTOMY N/A 11/07/2015   Procedure: APPENDECTOMY LAPAROSCOPIC;  Surgeon: Dann Hummer, MD;  Location: [facility] OR;  Service: General;  Laterality: N/A;   LAPAROSCOPIC INCISIONAL / UMBILICAL / VENTRAL HERNIA REPAIR  11/07/2015   UHR   LUMBAR LAMINECTOMY/DECOMPRESSION MICRODISCECTOMY N/A 03/21/2021   Procedure: Microlumbar decompression Lumbar four-five Central;  Surgeon: Duwayne Purchase, MD;  Location: [facility] OR;  Service: Orthopedics;  Laterality: N/A;   SPINE SURGERY  2022   Dr Baird / [facility]   Tooth implant     at least 5 years ago per pt   TUBAL LIGATION  09/16/1971   UMBILICAL HERNIA REPAIR N/A 11/07/2015   Procedure: LAPAROSCOPIC UMBILICAL HERNIA;  Surgeon: Dann Hummer, MD;  Location: [facility] OR;  Service: General;  Laterality: N/A;    Family History  Problem Relation Age of Onset   Congestive Heart Failure Mother    Hypertension Mother    Hyperlipidemia Mother    Heart disease Mother    Obesity Mother    Arthritis Mother    Varicose Veins Mother    Leukemia Father    Obesity Father    Cancer Father    Vision loss Father    Kidney disease Brother    Cancer Brother        stage 4 kidney cancer, metastatic   Kidney cancer Brother    Vision loss Brother    Leukemia Maternal Aunt    Congestive Heart Failure Maternal Grandmother    Arthritis Sister    Varicose Veins Sister    Cancer Brother    Cancer Sister    Early death  Sister    Colon cancer Neg Hx     Social History:  reports that she has never smoked. She has never used smokeless tobacco. She reports that she does not drink alcohol and does not use drugs.  Allergies: Allergies[1]   No results found for this or any previous visit (from the past 48 hours).  No results found.  ROS Blood pressure (!) 152/93, pulse 75, height 5' 4 (1.626 m), weight 199 lb (90.3 kg), SpO2 91%. Physical Exam Constitutional:      Appearance: Normal appearance.  HENT:     Head: Normocephalic and atraumatic.     Right Ear: Tympanic membrane is without lesions and middle ear aerated, ear canal and external ear normal.     Left Ear: Tympanic membrane is without lesions and middle ear aerated, ear canal and external ear normal.     Nose: Nose is without deviation of the dorsum.  The septum is clearly deviated to the left.  There is no septal hematoma or issue with infection.  Turbinates with mild hypertrophy, No significant swelling or masses.     Oral cavity/oropharynx: Mucous membranes are moist. No lesions or masses    Larynx: normal voice. Mirror attempted without success    Eyes:     Extraocular Movements: Extraocular movements intact.     Conjunctiva/sclera: Conjunctivae normal.     Pupils: Pupils are equal, round, and reactive to light.  Cardiovascular:     Rate and Rhythm: Normal rate.  Pulmonary:     Effort: Pulmonary effort is normal.  Musculoskeletal:     Cervical back: Normal range of motion and neck supple. No rigidity.  Lymphadenopathy:     Cervical: No cervical adenopathy or masses.salivary glands without lesions. .     Salivary glands- no mass or swelling Neurological:     Mental Status: He is alert. CN 2-12 intact. No nystagmus      Assessment/Plan: Deviated septum-we talked about this issue and likely the fall caused her to have a deviation.  Right now it is too far for a closed reduction.  We talked about the possibility of a septoplasty.  We  discussed using the nasal steroid spray first to see if this helps her and that is the direction she would like to go.  She will follow-up if she decides she does not want proceed with a surgical option.  Norleen Notice 10/04/2024, 3:29 PM        [1]  Allergies Allergen Reactions   Statins Other (See Comments)    Muscle pain   Codeine Nausea And Vomiting

## 2024-10-19 NOTE — Progress Notes (Unsigned)
{  SEHM (Optional):34217}  [name], DNP, AGNP-c [facility]  [address] [place], [place] [address] [phone]   ESTABLISHED PATIENT- Chronic Health and/or Follow-Up Visit on 10/24/2024  There were no vitals taken for this visit.   Subjective:  No chief complaint on file.   *** ROS negative except for what is listed in HPI. History, Medications, Surgery, SDOH, and Family History reviewed and updated as appropriate.  Objective:  Physical Exam      Assessment & Plan:   Assessment & Plan    [name] FORBES Doing, DNP, AGNP-c  {SETIMEYorN (Optional):34216}

## 2024-10-24 ENCOUNTER — Encounter: Payer: Self-pay | Admitting: Nurse Practitioner

## 2024-11-15 ENCOUNTER — Ambulatory Visit: Admitting: Adult Health

## 2024-12-05 ENCOUNTER — Ambulatory Visit: Admitting: Adult Health

## 2025-02-21 ENCOUNTER — Ambulatory Visit: Payer: Self-pay
